# Patient Record
Sex: Female | Born: 1940 | Race: White | Hispanic: No | Smoking: Never smoker
Health system: Southern US, Community
[De-identification: ages and names within clinical notes are randomized; demographics above are authoritative.]

## PROBLEM LIST (undated history)

## (undated) DIAGNOSIS — M1990 Unspecified osteoarthritis, unspecified site: Secondary | ICD-10-CM

## (undated) DIAGNOSIS — C801 Malignant (primary) neoplasm, unspecified: Secondary | ICD-10-CM

## (undated) DIAGNOSIS — J45909 Unspecified asthma, uncomplicated: Secondary | ICD-10-CM

## (undated) DIAGNOSIS — I509 Heart failure, unspecified: Secondary | ICD-10-CM

## (undated) DIAGNOSIS — M170 Bilateral primary osteoarthritis of knee: Secondary | ICD-10-CM

## (undated) DIAGNOSIS — K5792 Diverticulitis of intestine, part unspecified, without perforation or abscess without bleeding: Secondary | ICD-10-CM

## (undated) DIAGNOSIS — J302 Other seasonal allergic rhinitis: Secondary | ICD-10-CM

## (undated) DIAGNOSIS — K219 Gastro-esophageal reflux disease without esophagitis: Secondary | ICD-10-CM

## (undated) DIAGNOSIS — C4490 Unspecified malignant neoplasm of skin, unspecified: Secondary | ICD-10-CM

## (undated) DIAGNOSIS — I499 Cardiac arrhythmia, unspecified: Secondary | ICD-10-CM

## (undated) DIAGNOSIS — G4730 Sleep apnea, unspecified: Secondary | ICD-10-CM

## (undated) DIAGNOSIS — I10 Essential (primary) hypertension: Secondary | ICD-10-CM

## (undated) DIAGNOSIS — T7840XA Allergy, unspecified, initial encounter: Secondary | ICD-10-CM

## (undated) DIAGNOSIS — E785 Hyperlipidemia, unspecified: Secondary | ICD-10-CM

## (undated) DIAGNOSIS — J189 Pneumonia, unspecified organism: Secondary | ICD-10-CM

## (undated) DIAGNOSIS — G5600 Carpal tunnel syndrome, unspecified upper limb: Secondary | ICD-10-CM

## (undated) DIAGNOSIS — B019 Varicella without complication: Secondary | ICD-10-CM

## (undated) DIAGNOSIS — Z85828 Personal history of other malignant neoplasm of skin: Secondary | ICD-10-CM

## (undated) DIAGNOSIS — Z9289 Personal history of other medical treatment: Secondary | ICD-10-CM

## (undated) DIAGNOSIS — K635 Polyp of colon: Secondary | ICD-10-CM

## (undated) DIAGNOSIS — K429 Umbilical hernia without obstruction or gangrene: Secondary | ICD-10-CM

## (undated) DIAGNOSIS — N83209 Unspecified ovarian cyst, unspecified side: Secondary | ICD-10-CM

## (undated) DIAGNOSIS — R0602 Shortness of breath: Secondary | ICD-10-CM

## (undated) DIAGNOSIS — R200 Anesthesia of skin: Secondary | ICD-10-CM

## (undated) DIAGNOSIS — H25019 Cortical age-related cataract, unspecified eye: Secondary | ICD-10-CM

## (undated) DIAGNOSIS — R202 Paresthesia of skin: Secondary | ICD-10-CM

## (undated) DIAGNOSIS — M810 Age-related osteoporosis without current pathological fracture: Secondary | ICD-10-CM

## (undated) DIAGNOSIS — I4891 Unspecified atrial fibrillation: Secondary | ICD-10-CM

## (undated) DIAGNOSIS — K5790 Diverticulosis of intestine, part unspecified, without perforation or abscess without bleeding: Secondary | ICD-10-CM

## (undated) HISTORY — PX: HERNIA REPAIR: SHX51

## (undated) HISTORY — DX: Unspecified atrial fibrillation: I48.91

## (undated) HISTORY — PX: UMBILICAL HERNIA REPAIR: SHX196

## (undated) HISTORY — PX: WISDOM TOOTH EXTRACTION: SHX21

## (undated) HISTORY — PX: JOINT REPLACEMENT: SHX530

## (undated) HISTORY — PX: OTHER SURGICAL HISTORY: SHX169

## (undated) HISTORY — PX: MASTECTOMY PARTIAL / LUMPECTOMY: SUR851

## (undated) HISTORY — DX: Heart failure, unspecified: I50.9

## (undated) HISTORY — DX: Allergy, unspecified, initial encounter: T78.40XA

## (undated) HISTORY — DX: Unspecified osteoarthritis, unspecified site: M19.90

## (undated) HISTORY — DX: Cortical age-related cataract, unspecified eye: H25.019

## (undated) HISTORY — DX: Unspecified ovarian cyst, unspecified side: N83.209

## (undated) HISTORY — DX: Personal history of other malignant neoplasm of skin: Z85.828

## (undated) HISTORY — DX: Unspecified asthma, uncomplicated: J45.909

## (undated) HISTORY — PX: BREAST SURGERY: SHX581

## (undated) HISTORY — PX: TRACHEOSTOMY: SUR1362

## (undated) HISTORY — DX: Personal history of other medical treatment: Z92.89

## (undated) HISTORY — DX: Unspecified malignant neoplasm of skin, unspecified: C44.90

## (undated) HISTORY — DX: Polyp of colon: K63.5

## (undated) HISTORY — PX: VARICOSE VEIN SURGERY: SHX832

## (undated) HISTORY — PX: ROTATOR CUFF REPAIR: SHX139

## (undated) HISTORY — DX: Other seasonal allergic rhinitis: J30.2

---
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
## 1961-06-22 HISTORY — PX: WISDOM TOOTH EXTRACTION: SHX21

## 1982-06-22 HISTORY — PX: BREAST BIOPSY: SHX20

## 1985-06-22 HISTORY — PX: BREAST BIOPSY: SHX20

## 1996-06-22 HISTORY — PX: WRIST SURGERY: SHX841

## 1999-06-23 DIAGNOSIS — K219 Gastro-esophageal reflux disease without esophagitis: Secondary | ICD-10-CM

## 1999-06-23 HISTORY — DX: Gastro-esophageal reflux disease without esophagitis: K21.9

## 2004-04-22 ENCOUNTER — Ambulatory Visit: Payer: Self-pay | Admitting: Internal Medicine

## 2004-08-07 ENCOUNTER — Emergency Department: Payer: Self-pay | Admitting: Emergency Medicine

## 2004-08-07 ENCOUNTER — Other Ambulatory Visit: Payer: Self-pay

## 2004-12-10 ENCOUNTER — Ambulatory Visit: Payer: Self-pay | Admitting: Internal Medicine

## 2005-01-12 ENCOUNTER — Ambulatory Visit: Payer: Self-pay | Admitting: Internal Medicine

## 2005-07-20 ENCOUNTER — Ambulatory Visit: Payer: Self-pay | Admitting: Unknown Physician Specialty

## 2005-07-20 HISTORY — PX: COLONOSCOPY: SHX174

## 2005-07-20 HISTORY — PX: ESOPHAGOGASTRODUODENOSCOPY: SHX1529

## 2006-01-21 ENCOUNTER — Ambulatory Visit: Payer: Self-pay | Admitting: Internal Medicine

## 2006-08-10 ENCOUNTER — Emergency Department: Payer: Self-pay | Admitting: Emergency Medicine

## 2007-01-26 ENCOUNTER — Ambulatory Visit: Payer: Self-pay | Admitting: Internal Medicine

## 2007-02-20 ENCOUNTER — Emergency Department: Payer: Self-pay | Admitting: Emergency Medicine

## 2007-05-03 ENCOUNTER — Ambulatory Visit: Payer: Self-pay

## 2007-06-23 HISTORY — PX: SKIN CANCER EXCISION: SHX779

## 2007-06-27 ENCOUNTER — Other Ambulatory Visit: Payer: Self-pay

## 2007-06-27 ENCOUNTER — Ambulatory Visit: Payer: Self-pay | Admitting: Unknown Physician Specialty

## 2007-06-29 ENCOUNTER — Ambulatory Visit: Payer: Self-pay | Admitting: Unknown Physician Specialty

## 2007-06-29 ENCOUNTER — Other Ambulatory Visit: Payer: Self-pay

## 2007-07-01 ENCOUNTER — Other Ambulatory Visit: Payer: Self-pay

## 2007-07-01 ENCOUNTER — Inpatient Hospital Stay: Payer: Self-pay | Admitting: Internal Medicine

## 2007-08-25 ENCOUNTER — Other Ambulatory Visit: Payer: Self-pay

## 2007-08-25 ENCOUNTER — Inpatient Hospital Stay: Payer: Self-pay | Admitting: Internal Medicine

## 2007-10-13 ENCOUNTER — Emergency Department: Payer: Self-pay | Admitting: Emergency Medicine

## 2007-10-13 ENCOUNTER — Other Ambulatory Visit: Payer: Self-pay

## 2007-10-20 ENCOUNTER — Ambulatory Visit: Payer: Self-pay | Admitting: Internal Medicine

## 2007-10-21 ENCOUNTER — Ambulatory Visit: Payer: Self-pay | Admitting: Internal Medicine

## 2007-11-28 ENCOUNTER — Ambulatory Visit: Payer: Self-pay | Admitting: Internal Medicine

## 2007-12-19 ENCOUNTER — Observation Stay: Payer: Self-pay | Admitting: Internal Medicine

## 2007-12-19 ENCOUNTER — Other Ambulatory Visit: Payer: Self-pay

## 2007-12-21 ENCOUNTER — Ambulatory Visit: Payer: Self-pay | Admitting: Internal Medicine

## 2008-01-30 ENCOUNTER — Ambulatory Visit: Payer: Self-pay | Admitting: Internal Medicine

## 2008-05-16 ENCOUNTER — Emergency Department: Payer: Self-pay | Admitting: Emergency Medicine

## 2008-06-22 HISTORY — PX: LIPOMA EXCISION: SHX5283

## 2009-01-31 ENCOUNTER — Ambulatory Visit: Payer: Self-pay | Admitting: Internal Medicine

## 2009-02-23 ENCOUNTER — Observation Stay: Payer: Self-pay | Admitting: Rheumatology

## 2009-06-22 DIAGNOSIS — G4730 Sleep apnea, unspecified: Secondary | ICD-10-CM

## 2009-06-22 HISTORY — DX: Sleep apnea, unspecified: G47.30

## 2009-07-09 ENCOUNTER — Emergency Department: Payer: Self-pay | Admitting: Emergency Medicine

## 2009-08-03 ENCOUNTER — Ambulatory Visit: Payer: Self-pay

## 2009-09-23 ENCOUNTER — Ambulatory Visit: Payer: Self-pay | Admitting: Unknown Physician Specialty

## 2009-09-27 ENCOUNTER — Ambulatory Visit: Payer: Self-pay | Admitting: Unknown Physician Specialty

## 2009-09-30 ENCOUNTER — Observation Stay: Payer: Self-pay | Admitting: Internal Medicine

## 2010-02-04 ENCOUNTER — Ambulatory Visit: Payer: Self-pay | Admitting: Internal Medicine

## 2010-03-14 ENCOUNTER — Emergency Department: Payer: Self-pay | Admitting: Emergency Medicine

## 2010-04-23 ENCOUNTER — Ambulatory Visit: Payer: Self-pay | Admitting: Cardiology

## 2010-05-01 ENCOUNTER — Ambulatory Visit: Payer: Self-pay | Admitting: Cardiology

## 2010-07-07 ENCOUNTER — Ambulatory Visit: Payer: Self-pay | Admitting: Unknown Physician Specialty

## 2010-07-07 HISTORY — PX: COLONOSCOPY: SHX174

## 2010-07-15 ENCOUNTER — Ambulatory Visit: Payer: Self-pay | Admitting: Surgery

## 2010-09-06 ENCOUNTER — Observation Stay: Payer: Self-pay | Admitting: Internal Medicine

## 2011-02-11 ENCOUNTER — Ambulatory Visit: Payer: Self-pay | Admitting: Internal Medicine

## 2011-02-25 ENCOUNTER — Ambulatory Visit: Payer: Self-pay | Admitting: Internal Medicine

## 2011-12-17 ENCOUNTER — Ambulatory Visit: Payer: Self-pay | Admitting: Unknown Physician Specialty

## 2011-12-17 LAB — CREATININE, SERUM
Creatinine: 0.92 mg/dL (ref 0.60–1.30)
EGFR (African American): >60

## 2012-02-01 ENCOUNTER — Observation Stay: Payer: Self-pay | Admitting: Internal Medicine

## 2012-02-01 LAB — COMPREHENSIVE METABOLIC PANEL
Alkaline Phosphatase: 110 U/L (ref 50–136)
Calcium, Total: 10.3 mg/dL — ABNORMAL HIGH (ref 8.5–10.1)
Chloride: 104 mmol/L (ref 98–107)
Co2: 29 mmol/L (ref 21–32)
EGFR (African American): >60
EGFR (Non-African Amer.): >60
Osmolality: 279 (ref 275–301)
SGPT (ALT): 32 U/L (ref 12–78)
Sodium: 140 mmol/L (ref 136–145)

## 2012-02-01 LAB — TROPONIN I: Troponin-I: <0.02 ng/mL

## 2012-02-01 LAB — CK TOTAL AND CKMB (NOT AT ARMC): CK-MB: 0.7 ng/mL (ref 0.5–3.6)

## 2012-02-01 LAB — URINALYSIS, COMPLETE
Bacteria: NONE SEEN
Bilirubin,UR: NEGATIVE
Blood: NEGATIVE
Glucose,UR: NEGATIVE mg/dL (ref 0–75)
Ketone: NEGATIVE
Ph: 7 (ref 4.5–8.0)
RBC,UR: 5 /HPF (ref 0–5)
Squamous Epithelial: <1

## 2012-02-01 LAB — PROTIME-INR: Prothrombin Time: 25.1 secs — ABNORMAL HIGH (ref 11.5–14.7)

## 2012-02-01 LAB — LIPASE, BLOOD: Lipase: 200 U/L (ref 73–393)

## 2012-02-01 LAB — CBC
HGB: 15.8 g/dL (ref 12.0–16.0)
MCH: 31.8 pg (ref 26.0–34.0)
MCHC: 33.5 g/dL (ref 32.0–36.0)
Platelet: 220 10*3/uL (ref 150–440)

## 2012-02-02 LAB — BASIC METABOLIC PANEL
BUN: 13 mg/dL (ref 7–18)
Chloride: 105 mmol/L (ref 98–107)
Creatinine: 0.9 mg/dL (ref 0.60–1.30)
EGFR (Non-African Amer.): >60
Glucose: 94 mg/dL (ref 65–99)
Potassium: 3.3 mmol/L — ABNORMAL LOW (ref 3.5–5.1)

## 2012-02-02 LAB — CBC WITH DIFFERENTIAL/PLATELET
Basophil %: 1 %
Eosinophil #: 0.1 10*3/uL (ref 0.0–0.7)
HCT: 43.1 % (ref 35.0–47.0)
HGB: 14.7 g/dL (ref 12.0–16.0)
Lymphocyte #: 1.8 10*3/uL (ref 1.0–3.6)
MCH: 32.2 pg (ref 26.0–34.0)
MCHC: 34 g/dL (ref 32.0–36.0)
MCV: 95 fL (ref 80–100)
Monocyte #: 0.9 x10 3/mm (ref 0.2–0.9)
Neutrophil #: 3.7 10*3/uL (ref 1.4–6.5)

## 2012-02-02 LAB — PROTIME-INR
INR: 2.1
Prothrombin Time: 24 secs — ABNORMAL HIGH (ref 11.5–14.7)

## 2012-02-02 LAB — TROPONIN I
Troponin-I: <0.02 ng/mL
Troponin-I: <0.02 ng/mL

## 2012-02-12 ENCOUNTER — Ambulatory Visit: Payer: Self-pay | Admitting: Internal Medicine

## 2012-02-27 ENCOUNTER — Ambulatory Visit: Payer: Self-pay | Admitting: Unknown Physician Specialty

## 2012-04-27 ENCOUNTER — Emergency Department: Payer: Self-pay | Admitting: Emergency Medicine

## 2012-04-27 LAB — BASIC METABOLIC PANEL
Anion Gap: 11 (ref 7–16)
BUN: 18 mg/dL (ref 7–18)
Chloride: 104 mmol/L (ref 98–107)
Creatinine: 0.93 mg/dL (ref 0.60–1.30)
EGFR (Non-African Amer.): >60
Osmolality: 285 (ref 275–301)

## 2012-04-27 LAB — CBC
HGB: 16.3 g/dL — ABNORMAL HIGH (ref 12.0–16.0)
MCH: 32 pg (ref 26.0–34.0)
Platelet: 209 10*3/uL (ref 150–440)
RBC: 5.09 10*6/uL (ref 3.80–5.20)
WBC: 7.5 10*3/uL (ref 3.6–11.0)

## 2012-04-27 LAB — CK TOTAL AND CKMB (NOT AT ARMC)
CK, Total: 121 U/L (ref 21–215)
CK-MB: 1 ng/mL (ref 0.5–3.6)

## 2012-04-27 LAB — TROPONIN I: Troponin-I: <0.02 ng/mL

## 2012-06-27 ENCOUNTER — Ambulatory Visit: Payer: Self-pay | Admitting: Internal Medicine

## 2012-07-28 ENCOUNTER — Encounter (HOSPITAL_COMMUNITY): Payer: Self-pay | Admitting: Pharmacist

## 2012-07-28 ENCOUNTER — Other Ambulatory Visit: Payer: Self-pay | Admitting: Neurosurgery

## 2012-08-01 NOTE — Pre-Procedure Instructions (Signed)
Laura Gonzalez  08/01/2012   Your procedure is scheduled on:  Thursday, February 13th.  Report to [facility] at 9:30 AM.  Call this number if you have problems the morning of surgery: [phone]   Remember:   Do not eat food or drink liquids after midnight.    Take these medicines the morning of surgery with A SIP OF WATER: Diltiazem (Cardizem), Lorataine  (Clartin), Theophylline (Theodur).  May use inhalers and nasal spray. Bring Albuterol inhaler to the hospital with you.  Stop taking Aspirin, Coumadin, Plavix, Effient and Herbal medications.  Don not take any NSAIDs ie: Ibuprofen,  Advil,Naproxen or any medication containing Aspirin.   Do not wear jewelry, make-up or nail polish.  Do not wear lotions, powders, or perfumes. You may wear deodorant.  Do not shave 48 hours prior to surgery.   Do not bring valuables to the hospital.  Contacts, dentures or bridgework may not be worn into surgery.  Leave suitcase in the car. After surgery it may be brought to your room.  For patients admitted to the hospital, checkout time is 11:00 AM the day of  discharge.   Patients discharged the day of surgery will not be allowed to drive  home.  Name and phone number of your driver:-   Special Instructions: Shower using CHG 2 nights before surgery and the night before surgery.  If you shower the day of surgery use CHG.  Use special wash - you have one bottle of CHG for all showers.  You should use approximately 1/3 of the bottle for each shower.   Please read over the following fact sheets that you were given: Pain Booklet, Coughing and Deep Breathing and Surgical Site Infection Prevention

## 2012-08-02 ENCOUNTER — Encounter (HOSPITAL_COMMUNITY): Payer: Self-pay

## 2012-08-02 ENCOUNTER — Encounter (HOSPITAL_COMMUNITY)
Admission: RE | Admit: 2012-08-02 | Discharge: 2012-08-02 | Disposition: A | Discharge status: Home / Self Care | Payer: Medicare Other | Source: Ambulatory Visit | Attending: Neurosurgery | Admitting: Neurosurgery

## 2012-08-02 HISTORY — DX: Sleep apnea, unspecified: G47.30

## 2012-08-02 HISTORY — DX: Essential (primary) hypertension: I10

## 2012-08-02 HISTORY — DX: Unspecified asthma, uncomplicated: J45.909

## 2012-08-02 HISTORY — DX: Anesthesia of skin: R20.2

## 2012-08-02 HISTORY — DX: Shortness of breath: R06.02

## 2012-08-02 HISTORY — DX: Umbilical hernia without obstruction or gangrene: K42.9

## 2012-08-02 HISTORY — DX: Pneumonia, unspecified organism: J18.9

## 2012-08-02 HISTORY — DX: Age-related osteoporosis without current pathological fracture: M81.0

## 2012-08-02 HISTORY — DX: Anesthesia of skin: R20.0

## 2012-08-02 HISTORY — DX: Cardiac arrhythmia, unspecified: I49.9

## 2012-08-02 HISTORY — DX: Gastro-esophageal reflux disease without esophagitis: K21.9

## 2012-08-02 HISTORY — DX: Unspecified osteoarthritis, unspecified site: M19.90

## 2012-08-02 HISTORY — DX: Malignant (primary) neoplasm, unspecified: C80.1

## 2012-08-02 LAB — URINALYSIS, ROUTINE W REFLEX MICROSCOPIC
Bilirubin Urine: NEGATIVE
Hgb urine dipstick: NEGATIVE
Specific Gravity, Urine: 1.019 (ref 1.005–1.030)
Urobilinogen, UA: 0.2 mg/dL (ref 0.0–1.0)
pH: 7 (ref 5.0–8.0)

## 2012-08-02 LAB — SURGICAL PCR SCREEN: MRSA, PCR: NEGATIVE

## 2012-08-02 LAB — CBC WITH DIFFERENTIAL/PLATELET
Basophils Relative: 0 % (ref 0–1)
HCT: 46.9 % — ABNORMAL HIGH (ref 36.0–46.0)
Hemoglobin: 15.8 g/dL — ABNORMAL HIGH (ref 12.0–15.0)
Lymphocytes Relative: 20 % (ref 12–46)
Lymphs Abs: 1.6 10*3/uL (ref 0.7–4.0)
MCHC: 33.7 g/dL (ref 30.0–36.0)
Monocytes Absolute: 0.9 10*3/uL (ref 0.1–1.0)
Monocytes Relative: 12 % (ref 3–12)
Neutro Abs: 5.3 10*3/uL (ref 1.7–7.7)
Neutrophils Relative %: 68 % (ref 43–77)
RBC: 5.03 MIL/uL (ref 3.87–5.11)
WBC: 7.8 10*3/uL (ref 4.0–10.5)

## 2012-08-02 LAB — BASIC METABOLIC PANEL
BUN: 22 mg/dL (ref 6–23)
Chloride: 100 mEq/L (ref 96–112)
GFR calc Af Amer: >90 mL/min (ref >90)
GFR calc non Af Amer: 82 mL/min — ABNORMAL LOW (ref >90)
Potassium: 3.6 mEq/L (ref 3.5–5.1)
Sodium: 141 mEq/L (ref 135–145)

## 2012-08-02 LAB — PROTIME-INR
INR: 1.13 (ref 0.00–1.49)
Prothrombin Time: 14.3 seconds (ref 11.6–15.2)

## 2012-08-02 LAB — URINE MICROSCOPIC-ADD ON

## 2012-08-02 LAB — APTT: aPTT: 38 seconds — ABNORMAL HIGH (ref 24–37)

## 2012-08-02 NOTE — Progress Notes (Signed)
Ms Fukushima said that she had a chest  X-ray within the year at [facility].  [facility] did not have a chest x-ray.  Will do Chest x-ray day os suregery.

## 2012-08-03 LAB — URINE CULTURE: Colony Count: 6000

## 2012-08-03 NOTE — Consult Note (Signed)
Anesthesia Chart Review:  Patient is a 72 year old female scheduled for C3-4, C4-5 ACDF on 08/09/12 by Dr. Phoebe Perch (rescheduled from 08/04/12 due to inclement weather).  History noted and includes afib.  Patient was evaluated by Cardiologist Dr. Lady Gary and cleared for this procedure.  Records from [facility] records showed: EKG on 07/09/11 showed SR, septal infarct (age undetermined), cannot rule out inferior infarct (age undetermined). Event monitor on 06/24/12 showed SR/SB, PACs.  Exercise treadmill test on 09/12/09 showed fair exercise tolerance with no arrhythmias or ischemia, normal LV function, EF 71%, no evidence of infarction or ischemia.  Preoperative labs noted.  She is for a CXR on the day of surgery.  Shonna Chock, PA-C 08/03/12 1640

## 2012-08-08 MED ORDER — CEFAZOLIN SODIUM-DEXTROSE 2-3 GM-% IV SOLR
2.0000 g | INTRAVENOUS | Status: DC
  Filled 2012-08-08 (×2): qty 50

## 2012-08-09 ENCOUNTER — Encounter (HOSPITAL_COMMUNITY)
Admission: RE | Disposition: A | Discharge status: Home / Self Care | Payer: Self-pay | Source: Ambulatory Visit | Attending: Neurosurgery

## 2012-08-09 ENCOUNTER — Inpatient Hospital Stay (HOSPITAL_COMMUNITY): Payer: Medicare Other | Admitting: Anesthesiology

## 2012-08-09 ENCOUNTER — Inpatient Hospital Stay (HOSPITAL_COMMUNITY)
Admission: RE | Admit: 2012-08-09 | Discharge: 2012-08-11 | DRG: 472 | Disposition: A | Discharge status: Home / Self Care | Payer: Medicare Other | Source: Ambulatory Visit | Attending: Neurosurgery | Admitting: Neurosurgery

## 2012-08-09 ENCOUNTER — Encounter (HOSPITAL_COMMUNITY): Payer: Self-pay | Admitting: Vascular Surgery

## 2012-08-09 ENCOUNTER — Inpatient Hospital Stay (HOSPITAL_COMMUNITY): Payer: Medicare Other

## 2012-08-09 DIAGNOSIS — M5000 Cervical disc disorder with myelopathy, unspecified cervical region: Principal | ICD-10-CM | POA: Diagnosis present

## 2012-08-09 DIAGNOSIS — Z01812 Encounter for preprocedural laboratory examination: Secondary | ICD-10-CM

## 2012-08-09 DIAGNOSIS — Z7901 Long term (current) use of anticoagulants: Secondary | ICD-10-CM

## 2012-08-09 DIAGNOSIS — Z79899 Other long term (current) drug therapy: Secondary | ICD-10-CM

## 2012-08-09 DIAGNOSIS — M4712 Other spondylosis with myelopathy, cervical region: Secondary | ICD-10-CM | POA: Diagnosis present

## 2012-08-09 HISTORY — PX: ANTERIOR CERVICAL DECOMP/DISCECTOMY FUSION: SHX1161

## 2012-08-09 SURGERY — ANTERIOR CERVICAL DECOMPRESSION/DISCECTOMY FUSION 2 LEVELS
Anesthesia: General | Site: Neck | Wound class: Clean

## 2012-08-09 MED ORDER — EZETIMIBE 10 MG PO TABS
10.0000 mg | ORAL_TABLET | Freq: Every day | ORAL | Status: DC
  Administered 2012-08-10: 10 mg via ORAL
  Filled 2012-08-09 (×4): qty 1

## 2012-08-09 MED ORDER — MOMETASONE FURO-FORMOTEROL FUM 100-5 MCG/ACT IN AERO
2.0000 | INHALATION_SPRAY | Freq: Two times a day (BID) | RESPIRATORY_TRACT | Status: DC
  Administered 2012-08-10 – 2012-08-11 (×3): 2 via RESPIRATORY_TRACT
  Filled 2012-08-09: qty 8.8

## 2012-08-09 MED ORDER — OXYCODONE HCL 5 MG PO TABS: 5.0000 mg | ORAL_TABLET | Freq: Once | ORAL | Status: DC | PRN

## 2012-08-09 MED ORDER — PROMETHAZINE HCL 25 MG PO TABS: 12.5000 mg | ORAL_TABLET | ORAL | Status: DC | PRN

## 2012-08-09 MED ORDER — VANCOMYCIN HCL IN DEXTROSE 1-5 GM/200ML-% IV SOLN
INTRAVENOUS | Status: AC
  Filled 2012-08-09: qty 200

## 2012-08-09 MED ORDER — HYDROMORPHONE HCL PF 1 MG/ML IJ SOLN
INTRAMUSCULAR | Status: AC
  Administered 2012-08-09: 0.5 mg
  Filled 2012-08-09: qty 1

## 2012-08-09 MED ORDER — DILTIAZEM HCL 60 MG PO TABS
120.0000 mg | ORAL_TABLET | Freq: Four times a day (QID) | ORAL | Status: DC | PRN
  Filled 2012-08-09: qty 2

## 2012-08-09 MED ORDER — LIDOCAINE-EPINEPHRINE 1 %-1:100000 IJ SOLN
INTRAMUSCULAR | Status: DC | PRN
  Administered 2012-08-09: 10 mL via INTRADERMAL

## 2012-08-09 MED ORDER — NEOSTIGMINE METHYLSULFATE 1 MG/ML IJ SOLN
INTRAMUSCULAR | Status: DC | PRN
  Administered 2012-08-09: 5 mg via INTRAVENOUS

## 2012-08-09 MED ORDER — ACETAMINOPHEN 650 MG RE SUPP: 650.0000 mg | RECTAL | Status: DC | PRN

## 2012-08-09 MED ORDER — LORATADINE 10 MG PO TABS
10.0000 mg | ORAL_TABLET | Freq: Every day | ORAL | Status: DC
  Administered 2012-08-10 – 2012-08-11 (×2): 10 mg via ORAL
  Filled 2012-08-09 (×2): qty 1

## 2012-08-09 MED ORDER — SODIUM CHLORIDE 0.9 % IR SOLN
Status: DC | PRN
  Administered 2012-08-09: 11:00:00

## 2012-08-09 MED ORDER — LIDOCAINE HCL 4 % MT SOLN
OROMUCOSAL | Status: DC | PRN
  Administered 2012-08-09: 4 mL via TOPICAL

## 2012-08-09 MED ORDER — LACTATED RINGERS IV SOLN
INTRAVENOUS | Status: DC | PRN
  Administered 2012-08-09 (×2): via INTRAVENOUS

## 2012-08-09 MED ORDER — ONDANSETRON HCL 4 MG/2ML IJ SOLN: 4.0000 mg | INTRAMUSCULAR | Status: DC | PRN

## 2012-08-09 MED ORDER — ONDANSETRON HCL 4 MG/2ML IJ SOLN
INTRAMUSCULAR | Status: DC | PRN
  Administered 2012-08-09: 4 mg via INTRAVENOUS

## 2012-08-09 MED ORDER — BISACODYL 10 MG RE SUPP: 10.0000 mg | Freq: Every day | RECTAL | Status: DC | PRN

## 2012-08-09 MED ORDER — ROCURONIUM BROMIDE 100 MG/10ML IV SOLN
INTRAVENOUS | Status: DC | PRN
  Administered 2012-08-09: 10 mg via INTRAVENOUS
  Administered 2012-08-09: 50 mg via INTRAVENOUS
  Administered 2012-08-09: 20 mg via INTRAVENOUS

## 2012-08-09 MED ORDER — 0.9 % SODIUM CHLORIDE (POUR BTL) OPTIME
TOPICAL | Status: DC | PRN
  Administered 2012-08-09: 1000 mL

## 2012-08-09 MED ORDER — PHENOL 1.4 % MT LIQD: 1.0000 | OROMUCOSAL | Status: DC | PRN

## 2012-08-09 MED ORDER — ALBUTEROL SULFATE HFA 108 (90 BASE) MCG/ACT IN AERS
2.0000 | INHALATION_SPRAY | Freq: Four times a day (QID) | RESPIRATORY_TRACT | Status: DC | PRN
  Filled 2012-08-09: qty 6.7

## 2012-08-09 MED ORDER — DILTIAZEM HCL ER COATED BEADS 300 MG PO CP24
300.0000 mg | ORAL_CAPSULE | Freq: Every day | ORAL | Status: DC
  Administered 2012-08-10 – 2012-08-11 (×2): 300 mg via ORAL
  Filled 2012-08-09 (×2): qty 1

## 2012-08-09 MED ORDER — MAGNESIUM HYDROXIDE 400 MG/5ML PO SUSP: 30.0000 mL | Freq: Every day | ORAL | Status: DC | PRN

## 2012-08-09 MED ORDER — METHOCARBAMOL 100 MG/ML IJ SOLN
500.0000 mg | Freq: Four times a day (QID) | INTRAVENOUS | Status: DC | PRN
  Filled 2012-08-09: qty 5

## 2012-08-09 MED ORDER — MEPERIDINE HCL 25 MG/ML IJ SOLN: 6.2500 mg | INTRAMUSCULAR | Status: DC | PRN

## 2012-08-09 MED ORDER — METHOCARBAMOL 500 MG PO TABS: 500.0000 mg | ORAL_TABLET | Freq: Four times a day (QID) | ORAL | Status: DC | PRN

## 2012-08-09 MED ORDER — CYCLOBENZAPRINE HCL 10 MG PO TABS
ORAL_TABLET | ORAL | Status: AC
  Filled 2012-08-09: qty 1

## 2012-08-09 MED ORDER — ACETAMINOPHEN 325 MG PO TABS: 650.0000 mg | ORAL_TABLET | ORAL | Status: DC | PRN

## 2012-08-09 MED ORDER — ZOLPIDEM TARTRATE 5 MG PO TABS: 5.0000 mg | ORAL_TABLET | Freq: Every evening | ORAL | Status: DC | PRN

## 2012-08-09 MED ORDER — THEOPHYLLINE ER 300 MG PO TB12
300.0000 mg | ORAL_TABLET | Freq: Two times a day (BID) | ORAL | Status: DC
  Administered 2012-08-10 – 2012-08-11 (×3): 300 mg via ORAL
  Filled 2012-08-09 (×5): qty 1

## 2012-08-09 MED ORDER — VITAMIN B-12 1000 MCG PO TABS
1000.0000 ug | ORAL_TABLET | Freq: Every day | ORAL | Status: DC
  Administered 2012-08-10 – 2012-08-11 (×2): 1000 ug via ORAL
  Filled 2012-08-09 (×3): qty 1

## 2012-08-09 MED ORDER — HYDROCODONE-ACETAMINOPHEN 5-325 MG PO TABS: 1.0000 | ORAL_TABLET | ORAL | Status: DC | PRN

## 2012-08-09 MED ORDER — MIDAZOLAM HCL 5 MG/5ML IJ SOLN
INTRAMUSCULAR | Status: DC | PRN
  Administered 2012-08-09: 2 mg via INTRAVENOUS

## 2012-08-09 MED ORDER — VANCOMYCIN HCL IN DEXTROSE 1-5 GM/200ML-% IV SOLN
1000.0000 mg | Freq: Once | INTRAVENOUS | Status: AC
  Administered 2012-08-09: 1000 mg via INTRAVENOUS
  Filled 2012-08-09: qty 200

## 2012-08-09 MED ORDER — BACITRACIN 50000 UNITS IM SOLR
INTRAMUSCULAR | Status: AC
  Filled 2012-08-09: qty 1

## 2012-08-09 MED ORDER — SODIUM CHLORIDE 0.9 % IV SOLN: 0.5000 mg/h | Freq: Once | INTRAVENOUS | Status: DC

## 2012-08-09 MED ORDER — HYDROMORPHONE HCL PF 1 MG/ML IJ SOLN
0.2500 mg | INTRAMUSCULAR | Status: DC | PRN
  Administered 2012-08-09 (×3): 0.5 mg via INTRAVENOUS

## 2012-08-09 MED ORDER — LACTATED RINGERS IV SOLN
INTRAVENOUS | Status: DC
  Administered 2012-08-09: 17:00:00 via INTRAVENOUS

## 2012-08-09 MED ORDER — PROMETHAZINE HCL 25 MG/ML IJ SOLN
12.5000 mg | INTRAMUSCULAR | Status: DC | PRN
  Administered 2012-08-09 (×2): 12.5 mg via INTRAVENOUS
  Filled 2012-08-09 (×2): qty 1

## 2012-08-09 MED ORDER — GLYCOPYRROLATE 0.2 MG/ML IJ SOLN
INTRAMUSCULAR | Status: DC | PRN
  Administered 2012-08-09: 0.6 mg via INTRAVENOUS

## 2012-08-09 MED ORDER — SODIUM CHLORIDE 0.9 % IJ SOLN: 3.0000 mL | INTRAMUSCULAR | Status: DC | PRN

## 2012-08-09 MED ORDER — ALBUTEROL SULFATE (5 MG/ML) 0.5% IN NEBU: 2.5000 mg | INHALATION_SOLUTION | Freq: Four times a day (QID) | RESPIRATORY_TRACT | Status: DC | PRN

## 2012-08-09 MED ORDER — SODIUM CHLORIDE 0.9 % IJ SOLN
3.0000 mL | Freq: Two times a day (BID) | INTRAMUSCULAR | Status: DC
  Administered 2012-08-09 – 2012-08-11 (×4): 3 mL via INTRAVENOUS

## 2012-08-09 MED ORDER — THROMBIN 5000 UNITS EX KIT
PACK | CUTANEOUS | Status: DC | PRN
  Administered 2012-08-09 (×2): 5000 [IU] via TOPICAL

## 2012-08-09 MED ORDER — ONDANSETRON HCL 4 MG/2ML IJ SOLN
INTRAMUSCULAR | Status: AC
  Filled 2012-08-09: qty 2

## 2012-08-09 MED ORDER — VANCOMYCIN HCL IN DEXTROSE 1-5 GM/200ML-% IV SOLN
1000.0000 mg | Freq: Once | INTRAVENOUS | Status: AC
  Administered 2012-08-09: 1000 mg via INTRAVENOUS

## 2012-08-09 MED ORDER — SUFENTANIL CITRATE 50 MCG/ML IV SOLN
INTRAVENOUS | Status: DC | PRN
  Administered 2012-08-09: 10 ug via INTRAVENOUS
  Administered 2012-08-09: 30 ug via INTRAVENOUS

## 2012-08-09 MED ORDER — DEXAMETHASONE SODIUM PHOSPHATE 4 MG/ML IJ SOLN
10.0000 mg | Freq: Once | INTRAMUSCULAR | Status: AC
  Administered 2012-08-09: 10 mg via INTRAVENOUS

## 2012-08-09 MED ORDER — MORPHINE SULFATE 2 MG/ML IJ SOLN
1.0000 mg | INTRAMUSCULAR | Status: DC | PRN
  Administered 2012-08-09: 2 mg via INTRAVENOUS
  Filled 2012-08-09: qty 1

## 2012-08-09 MED ORDER — FLUTICASONE PROPIONATE 50 MCG/ACT NA SUSP
1.0000 | Freq: Every day | NASAL | Status: DC
  Administered 2012-08-10 – 2012-08-11 (×2): 1 via NASAL
  Filled 2012-08-09: qty 16

## 2012-08-09 MED ORDER — ONDANSETRON HCL 4 MG/2ML IJ SOLN
4.0000 mg | Freq: Once | INTRAMUSCULAR | Status: AC | PRN
  Administered 2012-08-09: 4 mg via INTRAVENOUS

## 2012-08-09 MED ORDER — EPHEDRINE SULFATE 50 MG/ML IJ SOLN
INTRAMUSCULAR | Status: DC | PRN
  Administered 2012-08-09: 10 mg via INTRAVENOUS

## 2012-08-09 MED ORDER — PROPOFOL 10 MG/ML IV BOLUS
INTRAVENOUS | Status: DC | PRN
  Administered 2012-08-09: 100 mg via INTRAVENOUS
  Administered 2012-08-09: 30 mg via INTRAVENOUS
  Administered 2012-08-09: 20 mg via INTRAVENOUS

## 2012-08-09 MED ORDER — SODIUM CHLORIDE 0.9 % IV SOLN
INTRAVENOUS | Status: AC
  Filled 2012-08-09: qty 500

## 2012-08-09 MED ORDER — POTASSIUM CHLORIDE CRYS ER 20 MEQ PO TBCR
20.0000 meq | EXTENDED_RELEASE_TABLET | Freq: Three times a day (TID) | ORAL | Status: DC
  Administered 2012-08-10 – 2012-08-11 (×3): 20 meq via ORAL
  Filled 2012-08-09 (×8): qty 1

## 2012-08-09 MED ORDER — PHENYLEPHRINE HCL 10 MG/ML IJ SOLN
10.0000 mg | INTRAVENOUS | Status: DC | PRN
  Administered 2012-08-09: 60 ug/min via INTRAVENOUS

## 2012-08-09 MED ORDER — MONTELUKAST SODIUM 10 MG PO TABS
10.0000 mg | ORAL_TABLET | Freq: Every day | ORAL | Status: DC
  Administered 2012-08-10: 10 mg via ORAL
  Filled 2012-08-09 (×3): qty 1

## 2012-08-09 MED ORDER — CYCLOBENZAPRINE HCL 10 MG PO TABS
10.0000 mg | ORAL_TABLET | Freq: Three times a day (TID) | ORAL | Status: DC | PRN
  Administered 2012-08-09: 10 mg via ORAL

## 2012-08-09 MED ORDER — ADULT MULTIVITAMIN W/MINERALS CH
1.0000 | ORAL_TABLET | Freq: Every day | ORAL | Status: DC
  Administered 2012-08-10 – 2012-08-11 (×2): 1 via ORAL
  Filled 2012-08-09 (×3): qty 1

## 2012-08-09 MED ORDER — DOCUSATE SODIUM 100 MG PO CAPS
100.0000 mg | ORAL_CAPSULE | Freq: Two times a day (BID) | ORAL | Status: DC
  Administered 2012-08-11: 100 mg via ORAL
  Filled 2012-08-09 (×2): qty 1

## 2012-08-09 MED ORDER — MENTHOL 3 MG MT LOZG
1.0000 | LOZENGE | OROMUCOSAL | Status: DC | PRN
  Administered 2012-08-09: 3 mg via ORAL
  Filled 2012-08-09: qty 9

## 2012-08-09 MED ORDER — HYDROCHLOROTHIAZIDE 25 MG PO TABS
25.0000 mg | ORAL_TABLET | Freq: Every day | ORAL | Status: DC
  Administered 2012-08-10 – 2012-08-11 (×2): 25 mg via ORAL
  Filled 2012-08-09 (×3): qty 1

## 2012-08-09 MED ORDER — HEMOSTATIC AGENTS (NO CHARGE) OPTIME
TOPICAL | Status: DC | PRN
  Administered 2012-08-09: 1 via TOPICAL

## 2012-08-09 MED ORDER — HYDROMORPHONE HCL PF 1 MG/ML IJ SOLN
INTRAMUSCULAR | Status: AC
  Filled 2012-08-09: qty 1

## 2012-08-09 MED ORDER — OXYCODONE HCL 5 MG/5ML PO SOLN: 5.0000 mg | Freq: Once | ORAL | Status: DC | PRN

## 2012-08-09 SURGICAL SUPPLY — 50 items
BAG DECANTER FOR FLEXI CONT (MISCELLANEOUS) ×2 IMPLANT
BANDAGE GAUZE ELAST BULKY 4 IN (GAUZE/BANDAGES/DRESSINGS) ×4 IMPLANT
BENZOIN TINCTURE PRP APPL 2/3 (GAUZE/BANDAGES/DRESSINGS) ×2 IMPLANT
BIT DRILL 2.3 12 FIXED (INSTRUMENTS) ×1 IMPLANT
BONE CERV LORDOTIC 14.5X12X6 (Bone Implant) ×4 IMPLANT
BUR MATCHSTICK NEURO 3.0 LAGG (BURR) ×2 IMPLANT
CANISTER SUCTION 2500CC (MISCELLANEOUS) ×2 IMPLANT
CLOTH BEACON ORANGE TIMEOUT ST (SAFETY) ×2 IMPLANT
CONT SPEC 4OZ CLIKSEAL STRL BL (MISCELLANEOUS) ×2 IMPLANT
DRAPE LAPAROTOMY 100X72 PEDS (DRAPES) ×2 IMPLANT
DRAPE MICROSCOPE LEICA (MISCELLANEOUS) ×2 IMPLANT
DRAPE POUCH INSTRU U-SHP 10X18 (DRAPES) ×2 IMPLANT
DRILL 12MM (INSTRUMENTS) ×2
DURAPREP 6ML APPLICATOR 50/CS (WOUND CARE) ×2 IMPLANT
ELECT REM PT RETURN 9FT ADLT (ELECTROSURGICAL) ×2
ELECTRODE REM PT RTRN 9FT ADLT (ELECTROSURGICAL) ×1 IMPLANT
GAUZE SPONGE 4X4 16PLY XRAY LF (GAUZE/BANDAGES/DRESSINGS) IMPLANT
GLOVE BIOGEL M 8.0 STRL (GLOVE) ×2 IMPLANT
GLOVE ECLIPSE 7.5 STRL STRAW (GLOVE) ×2 IMPLANT
GLOVE EXAM NITRILE LRG STRL (GLOVE) IMPLANT
GLOVE EXAM NITRILE MD LF STRL (GLOVE) IMPLANT
GLOVE EXAM NITRILE XL STR (GLOVE) IMPLANT
GLOVE EXAM NITRILE XS STR PU (GLOVE) IMPLANT
GOWN BRE IMP SLV AUR LG STRL (GOWN DISPOSABLE) IMPLANT
GOWN BRE IMP SLV AUR XL STRL (GOWN DISPOSABLE) IMPLANT
GOWN STRL REIN 2XL LVL4 (GOWN DISPOSABLE) IMPLANT
HEAD HALTER (SOFTGOODS) ×2 IMPLANT
KIT BASIN OR (CUSTOM PROCEDURE TRAY) ×2 IMPLANT
KIT ROOM TURNOVER OR (KITS) ×2 IMPLANT
NEEDLE HYPO 22GX1.5 SAFETY (NEEDLE) IMPLANT
NEEDLE HYPO 25X1 1.5 SAFETY (NEEDLE) ×2 IMPLANT
NEEDLE SPNL 20GX3.5 QUINCKE YW (NEEDLE) ×2 IMPLANT
NS IRRIG 1000ML POUR BTL (IV SOLUTION) ×2 IMPLANT
PACK LAMINECTOMY NEURO (CUSTOM PROCEDURE TRAY) ×2 IMPLANT
PAD ARMBOARD 7.5X6 YLW CONV (MISCELLANEOUS) ×6 IMPLANT
PATTIES SURGICAL .75X.75 (GAUZE/BANDAGES/DRESSINGS) ×2 IMPLANT
PIN DISTRACTION 14MM (PIN) ×4 IMPLANT
PLATE 30MM (Plate) ×2 IMPLANT
RUBBERBAND STERILE (MISCELLANEOUS) ×4 IMPLANT
SCREW 12MM (Screw) ×12 IMPLANT
SPONGE GAUZE 4X4 12PLY (GAUZE/BANDAGES/DRESSINGS) ×2 IMPLANT
SPONGE INTESTINAL PEANUT (DISPOSABLE) ×2 IMPLANT
STRIP CLOSURE SKIN 1/2X4 (GAUZE/BANDAGES/DRESSINGS) ×2 IMPLANT
SUT VIC AB 3-0 SH 8-18 (SUTURE) ×2 IMPLANT
SYR 20ML ECCENTRIC (SYRINGE) ×2 IMPLANT
TAPE CLOTH SURG 4X10 WHT LF (GAUZE/BANDAGES/DRESSINGS) ×2 IMPLANT
TAPE STRIPS DRAPE STRL (GAUZE/BANDAGES/DRESSINGS) ×2 IMPLANT
TOWEL OR 17X24 6PK STRL BLUE (TOWEL DISPOSABLE) ×2 IMPLANT
TOWEL OR 17X26 10 PK STRL BLUE (TOWEL DISPOSABLE) ×2 IMPLANT
WATER STERILE IRR 1000ML POUR (IV SOLUTION) ×2 IMPLANT

## 2012-08-09 NOTE — Progress Notes (Signed)
Patient brought neck brace in with her and sent to OR with patient. Inhaler brought in with patient in chart ( Ventolin).

## 2012-08-09 NOTE — Op Note (Signed)
08/09/2012  12:29 PM  PATIENT:  Laura Gonzalez  72 y.o. female  PRE-OPERATIVE DIAGNOSIS:  Cervical degenerative disc disease,Cervicalgia, Cervical spondylosis, cervical stenosis with myelopathy  POST-OPERATIVE DIAGNOSIS:  Cervical degenerative disc disease,Cervicalgia, Cervical spondylosis,   cervical stenosis with myelopathy   PROCEDURE:  Procedure(s): ANTERIOR CERVICAL DECOMPRESSION/DISCECTOMY FUSION 2 LEVELS, C3-4, C4-5, with lifenet bone, trestle plate  SURGEON:  Surgeon(s): Clydene Fake, MD  ASSISTANTS:botero  ANESTHESIA:   general  EBL:  Total I/O In: 1000 [I.V.:1000] Out: 150 [Blood:150]  BLOOD ADMINISTERED:none  DRAINS: none   SPECIMEN:  No Specimen  DICTATION: Patient with neck pain headaches left arm pain total body aches pains chest pain clumsiness. She had multiple doctors at work at various issues finally an MRI C-spine was done showing cervical spinal change at multiple levels with stenosis and cord compression or so 3445 levels possible cord change at the 34 areas and patient with some mild myelopathy on exam after discussion was decided to proceed with 2 level ACF to decompress spinal cord.  Paper and operative general anesthesia induced patient placed in 10 pounds halter traction prepped draped sterile fashion 7 incision injected with 10 cc 1% lidocaine with epinephrine incision was then made from the midline to the border of the sternocleidomastoid muscle on the left side and neck. Hemostasis obtained with Bovie position a platysma was incised the Bovie and blunt dissection taken to the anterior cervical spine.   We exposed to disc spaces Needles were placed to an x-ray obtained showing the needles were in the 3-4 portal spaces. The spaces were incised with 15 blade as the knee was removed and partial discectomy done pituitary rongeurs. Contrast which removed with osteophyte tools lungs: Muscles reflected laterally and soaking retractors were placed. Distraction  pins were placed in C3 and C5 interspace distracted microscope was brought in for microdissection discectomy continued with curettes went to more Kerrison punches high-speed drill and starting 34 level and then at the fourth of level. We decompressed the central canal and spinal cord and bilateral foraminotomies were performed at both levels appeared were finished we did decompression measured to a height of the disc space at both L3-4 millimeters into formal LifeNet bone tapped in place. A trestle intracerebral plate was placed with interested in spine 2 screws placed in the region at C4-C5 these were tightened down lateral x-rays obtained showing good position plate-screw bone plugs at the 3-4 portal of level. Placed the plate we did remove the distraction pins removed weight from the traction it hemostasis with Gelfoam thrombin. We. About solution had very good hemostasis and the platysma closed through Vicryl interrupted sutures subcutaneous incision closed the same skin closed benzoin Steri-Strips dressing was placed patient saucer color woken (and transferred recovery.  PLAN OF CARE: Admit to inpatient   PATIENT DISPOSITION:  PACU - hemodynamically stable.

## 2012-08-09 NOTE — Progress Notes (Signed)
UR COMPLETED  

## 2012-08-09 NOTE — Plan of Care (Signed)
Problem: Consults Goal: Diagnosis - Spinal Surgery Outcome: Completed/Met Date Met:  08/09/12 Cervical Spine Fusion

## 2012-08-09 NOTE — Interval H&P Note (Signed)
History and Physical Interval Note:  08/09/2012 7:39 AM  Laura Gonzalez  has presented today for surgery, with the diagnosis of Cervical degenerative disc disease,Cervicalgia, Cervical spondylosis  The various methods of treatment have been discussed with the patient and family. After consideration of risks, benefits and other options for treatment, the patient has consented to  Procedure(s) with comments: ANTERIOR CERVICAL DECOMPRESSION/DISCECTOMY FUSION 2 LEVELS (N/A) - C3-4 C4-5 Anterior cervical decompression/diskectomy/fusion/LifeNet Bone/Trestle plate as a surgical intervention .  The patient's history has been reviewed, patient examined, no change in status, stable for surgery.  I have reviewed the patient's chart and labs.  Questions were answered to the patient's satisfaction.     Terresa Marlett R

## 2012-08-09 NOTE — Anesthesia Preprocedure Evaluation (Addendum)
Anesthesia Evaluation  Patient identified by MRN, date of birth, ID band Patient awake    Reviewed: Allergy & Precautions, H&P , NPO status   History of Anesthesia Complications Negative for: history of anesthetic complications  Airway Mallampati: II TM Distance: >3 FB Neck ROM: Full    Dental  (+) Teeth Intact and Dental Advisory Given   Pulmonary shortness of breath and with exertion, asthma , sleep apnea , pneumonia -, resolved,  breath sounds clear to auscultation        Cardiovascular hypertension, Pt. on medications + dysrhythmias Atrial Fibrillation Rhythm:Irregular Rate:Normal     Neuro/Psych negative neurological ROS     GI/Hepatic Neg liver ROS, GERD-  ,  Endo/Other  negative endocrine ROS  Renal/GU negative Renal ROS     Musculoskeletal   Abdominal   Peds  Hematology negative hematology ROS (+)   Anesthesia Other Findings   Reproductive/Obstetrics                          Anesthesia Physical Anesthesia Plan  ASA: III  Anesthesia Plan: General   Post-op Pain Management:    Induction: Intravenous  Airway Management Planned: Oral ETT  Additional Equipment:   Intra-op Plan:   Post-operative Plan: Extubation in OR  Informed Consent: I have reviewed the patients History and Physical, chart, labs and discussed the procedure including the risks, benefits and alternatives for the proposed anesthesia with the patient or authorized representative who has indicated his/her understanding and acceptance.     Plan Discussed with: CRNA and Surgeon  Anesthesia Plan Comments:        Anesthesia Quick Evaluation

## 2012-08-09 NOTE — Anesthesia Postprocedure Evaluation (Signed)
Anesthesia Post Note  Patient: Laura Gonzalez  Procedure(s) Performed: Procedure(s) (LRB): ANTERIOR CERVICAL DECOMPRESSION/DISCECTOMY FUSION 2 LEVELS (N/A)  Anesthesia type: general  Patient location: PACU  Post pain: Pain level controlled  Post assessment: Patient's Cardiovascular Status Stable  Last Vitals:  Filed Vitals:   08/09/12 1618  BP: 122/73  Pulse: 65  Temp: 36.7 C  Resp: 18    Post vital signs: Reviewed and stable  Level of consciousness: sedated  Complications: No apparent anesthesia complications

## 2012-08-09 NOTE — Progress Notes (Signed)
CXR and EKG in chart from 04/2012

## 2012-08-09 NOTE — H&P (Signed)
See H& P.

## 2012-08-09 NOTE — Progress Notes (Signed)
Pt. Snoring and sats dropping, has to be reminded to take deep breaths.

## 2012-08-09 NOTE — Transfer of Care (Signed)
Immediate Anesthesia Transfer of Care Note  Patient: Laura Gonzalez  Procedure(s) Performed: Procedure(s) with comments: ANTERIOR CERVICAL DECOMPRESSION/DISCECTOMY FUSION 2 LEVELS (N/A) - C3-4 C4-5 Anterior cervical decompression/diskectomy/fusion/LifeNet Bone/Trestle plate  Patient Location: PACU  Anesthesia Type:General  Level of Consciousness: awake, sedated and patient cooperative  Airway & Oxygen Therapy: Patient Spontanous Breathing and Patient connected to face mask oxygen  Post-op Assessment: Report given to PACU RN, Post -op Vital signs reviewed and stable, Patient moving all extremities and Patient moving all extremities X 4  Post vital signs: Reviewed and stable  Complications: No apparent anesthesia complications

## 2012-08-09 NOTE — Preoperative (Signed)
Beta Blockers   Reason not to administer Beta Blockers:Not Applicable 

## 2012-08-10 MED ORDER — DEXAMETHASONE SODIUM PHOSPHATE 4 MG/ML IJ SOLN
4.0000 mg | Freq: Four times a day (QID) | INTRAMUSCULAR | Status: DC
  Administered 2012-08-10 – 2012-08-11 (×5): 4 mg via INTRAVENOUS
  Filled 2012-08-10 (×8): qty 1

## 2012-08-10 MED ORDER — DEXAMETHASONE SODIUM PHOSPHATE 10 MG/ML IJ SOLN
INTRAMUSCULAR | Status: AC
  Administered 2012-08-10: 8 mg
  Filled 2012-08-10: qty 1

## 2012-08-10 MED ORDER — LACTATED RINGERS IV SOLN
INTRAVENOUS | Status: DC
  Administered 2012-08-10 – 2012-08-11 (×2): via INTRAVENOUS

## 2012-08-10 MED ORDER — DEXAMETHASONE SODIUM PHOSPHATE 4 MG/ML IJ SOLN: 8.0000 mg | Freq: Once | INTRAMUSCULAR | Status: AC

## 2012-08-10 NOTE — Evaluation (Signed)
Clinical/Bedside Swallow Evaluation Patient Details  Name: Laura Gonzalez MRN: [id number] Date of Birth: Sep 22, 1940  Today's Date: 08/10/2012 Time: 0945-1000 SLP Time Calculation (min): 15 min  Past Medical History:  Past Medical History  Diagnosis Date  . Dysrhythmia     afib  . Pneumonia   . Asthma   . Numbness and tingling     arm to leg  . Arthritis     wrist and knees  . Hernia, umbilical   . Shortness of breath     only with astma attacks  . Sleep apnea   . Cancer     skin cancer  . Osteoporosis   . Hypertension   . GERD (gastroesophageal reflux disease)    Past Surgical History:  Past Surgical History  Procedure Laterality Date  . Tonsillectomy  1960  . Umbilical hernia repair  [id number]  . Wisdom tooth extraction  1963  . Wrist surgery Right 1998    external fixator  . Colonscopy  2000,2007,2012  . Rotator cuff repair Bilateral right- 2009, left 2011  . Skin cancer excision  2009    back of neck and right cheek  . Lipoma excision Right 2010    back  . Varicose vein surgery Left 04/2009 rt [id number]   HPI:  72 year old female seem 2/18 for C3-4 C4-5 Anterior cervical decompression/diskectomy/fusion/LifeNet Bone/Trestle plate.C/o post op dysphagia.    Assessment / Plan / Recommendation Clinical Impression  Swallow evaluation complete at bedside. Patient presents with a suspected anatomical based dysphagia likely resulting from post op pharyngeal edema causing mild residuals and possible decreased airway protection during swallow. Patient however appears to be compensating with use of small bites/sips, multiple swallows, and occasional strong cough to decrease aspiration risk. Hopeful that with decreased swelling, oropharyngeal swallow will improve. Recommend a puree diet with thin liquids with strict aspiration precautions. Education complete with patient regarding rationale for and use of compensatory strategies and plan of care. SLP will f/u at bedside for  potential to advance with improved clinical function. If function worsens or does not improve in the next 24-48 hours, may consider objective evaluation of swallow.     Aspiration Risk  Moderate    Diet Recommendation Dysphagia 1 (Puree);Thin liquid   Liquid Administration via: Cup;Straw Medication Administration: Crushed with puree Supervision: Patient able to self feed;Intermittent supervision to cue for compensatory strategies Compensations: Slow rate;Small sips/bites;Multiple dry swallows after each bite/sip;Follow solids with liquid Postural Changes and/or Swallow Maneuvers: Seated upright 90 degrees;Upright 30-60 min after meal    Other  Recommendations Oral Care Recommendations: Oral care BID   Follow Up Recommendations  None    Frequency and Duration min 2x/week  2 weeks   Pertinent Vitals/Pain None reported other than odynophagia, RN aware.     SLP Swallow Goals Patient will utilize recommended strategies during swallow to increase swallowing safety with: Independent assistance Swallow Study Goal #2 - Progress: Not met   Swallow Study    General HPI: 72 year old female seem 2/18 for C3-4 C4-5 Anterior cervical decompression/diskectomy/fusion/LifeNet Bone/Trestle plate.C/o post op dysphagia.  Type of Study: Bedside swallow evaluation Previous Swallow Assessment: none Diet Prior to this Study: Regular;Thin liquids Temperature Spikes Noted: No Respiratory Status: Room air History of Recent Intubation: Yes Length of Intubations (days):  (for surgery only) Date extubated: 08/09/12 Behavior/Cognition: Alert;Cooperative;Pleasant mood Oral Cavity - Dentition: Adequate natural dentition Self-Feeding Abilities: Able to feed self Patient Positioning: Upright in bed Baseline Vocal Quality: Clear Volitional Cough: Strong (although  reports pain with swallow) Volitional Swallow: Able to elicit (with c/o pain)    Oral/Motor/Sensory Function Overall Oral Motor/Sensory Function:  Appears within functional limits for tasks assessed   Ice Chips Ice chips: Impaired Presentation: Spoon;Self Fed Pharyngeal Phase Impairments:  (multiple swallows, odynophagia)   Thin Liquid Thin Liquid: Impaired Presentation: Cup;Straw;Self Fed Pharyngeal  Phase Impairments: Multiple swallows (multiple swallows, odynophagia)    Nectar Thick Nectar Thick Liquid: Not tested   Honey Thick Honey Thick Liquid: Not tested   Puree Puree: Impaired Presentation: Spoon;Self Fed Pharyngeal Phase Impairments: Multiple swallows;Cough - Immediate (multiple swallows, odynophagia)   Solid   GO   Lourie Retz MA, CCC-SLP [phone]  Solid: Not tested       Caresse Sedivy Meryl 08/10/2012,10:22 AM

## 2012-08-11 ENCOUNTER — Encounter (HOSPITAL_COMMUNITY): Payer: Self-pay | Admitting: Neurosurgery

## 2012-08-11 MED ORDER — CYCLOBENZAPRINE HCL 10 MG PO TABS: 10.0000 mg | ORAL_TABLET | Freq: Three times a day (TID) | ORAL | Status: DC | PRN

## 2012-08-11 MED ORDER — TRAMADOL-ACETAMINOPHEN 37.5-325 MG PO TABS: 1.0000 | ORAL_TABLET | Freq: Four times a day (QID) | ORAL | Status: DC | PRN

## 2012-08-11 NOTE — Progress Notes (Signed)
Pt given D/C instructions with Rx's. Verbal understanding given by Pt. Pt D/C'd home with family via wheelchair @ 1545 per MD order. Rema Fendt, RN

## 2012-08-11 NOTE — Discharge Summary (Signed)
Physician Discharge Summary  Patient ID: Laura Gonzalez MRN: [id number] DOB/AGE: 08-05-1940 72 y.o.  Admit date: 08/09/2012 Discharge date: 08/11/2012  Admission Diagnoses:Cervical degenerative disc disease,Cervicalgia, Cervical spondylosis, cervical stenosis with myelopathy   Discharge Diagnoses: Cervical degenerative disc disease,Cervicalgia, Cervical spondylosis, cervical stenosis with myelopathy  Active Problems:   * No active hospital problems. *   Discharged Condition: fair  Hospital Course: pt admitted on day of surgery - underwent procedure below - pt had minimal pain, ambulated well - but had dysphagia -  Started on decadron -  And today much better - can swallow liquids and soft food - pt wishes to be d/c home  Consults: none  Significant Diagnostic Studies: none  Treatments: surgery: ANTERIOR CERVICAL DECOMPRESSION/DISCECTOMY FUSION 2 LEVELS, C3-4, C4-5, with lifenet bone, trestle plate   Discharge Exam: Blood pressure 117/79, pulse 87, temperature 98.8 F (37.1 C), temperature source Oral, resp. rate 18, height 5' 4.96" (1.65 m), weight 104.5 kg (230 lb 6.1 oz), SpO2 92.00%. Wound:c/d/i  Disposition: home     Medication List    TAKE these medications       albuterol 108 (90 BASE) MCG/ACT inhaler  Commonly known as:  PROVENTIL HFA;VENTOLIN HFA  Inhale 2 puffs into the lungs every 6 (six) hours as needed. For shortness of breath     albuterol (2.5 MG/3ML) 0.083% nebulizer solution  Commonly known as:  PROVENTIL  Take 2.5 mg by nebulization every 6 (six) hours as needed. For shortness of breath     CITRACAL PO  Take 1 tablet by mouth 2 (two) times daily.     cyclobenzaprine 10 MG tablet  Commonly known as:  FLEXERIL  Take 1 tablet (10 mg total) by mouth 3 (three) times daily as needed for muscle spasms.     diltiazem 300 MG 24 hr capsule  Commonly known as:  CARDIZEM CD  Take 300 mg by mouth daily.     diltiazem 60 MG tablet  Commonly known as:   CARDIZEM  Take 120 mg by mouth every 6 (six) hours as needed. Takes if has palpitation after taking diltiazem 300mg  dose     ezetimibe 10 MG tablet  Commonly known as:  ZETIA  Take 10 mg by mouth at bedtime.     Fish Oil 1200 MG Caps  Take 1,200 mg by mouth daily.     Fluticasone-Salmeterol 100-50 MCG/DOSE Aepb  Commonly known as:  ADVAIR  Inhale 1 puff into the lungs every 12 (twelve) hours.     hydrochlorothiazide 25 MG tablet  Commonly known as:  HYDRODIURIL  Take 25 mg by mouth daily.     loratadine 10 MG tablet  Commonly known as:  CLARITIN  Take 10 mg by mouth daily.     magnesium oxide 400 MG tablet  Commonly known as:  MAG-OX  Take 400 mg by mouth daily.     mometasone 50 MCG/ACT nasal spray  Commonly known as:  NASONEX  Place 1 spray into the nose daily.     montelukast 10 MG tablet  Commonly known as:  SINGULAIR  Take 10 mg by mouth at bedtime.     multivitamin with minerals Tabs  Take 1 tablet by mouth daily.     OSTEO BI-FLEX JOINT SHIELD PO  Take 1 tablet by mouth daily.     OVER THE COUNTER MEDICATION  Take 3,000 Units by mouth daily. Vitamin D3     potassium chloride SA 20 MEQ tablet  Commonly known as:  K-DUR,KLOR-CON  Take 20 mEq by mouth 3 (three) times daily.     PRESERVISION AREDS PO  Take 1 tablet by mouth daily.     theophylline 300 MG 12 hr tablet  Commonly known as:  THEODUR  Take 300 mg by mouth 2 (two) times daily.     traMADol-acetaminophen 37.5-325 MG per tablet  Commonly known as:  ULTRACET  Take 1-2 tablets by mouth every 6 (six) hours as needed for pain.     vitamin B-12 1000 MCG tablet  Commonly known as:  CYANOCOBALAMIN  Take 1,000 mcg by mouth daily.     warfarin 6 MG tablet  Commonly known as:  COUMADIN  Take 6 mg by mouth daily at 6 PM.         Signed: Clydene Fake, MD 08/11/2012, 11:50 AM

## 2012-08-11 NOTE — Progress Notes (Signed)
Speech Language Pathology Dysphagia Treatment Patient Details Name: Laura Gonzalez MRN: [id number] DOB: [birth year] Today's Date: 08/11/2012 Time: 1100-1115 SLP Time Calculation (min): 15 min  Assessment / Plan / Recommendation Clinical Impression  Treatment focused on diet tolerance and differential diagnosis of swallowing abilities for potential diet upgrade. Per patient report and skilled observation with clinician provided po trials, patient with decreased odynophagia today. Patient does continue to report intermittent sensation of pharyngeal residuals which are likely due to pharyngeal edema. Positive s/s of decreased airway protection noted with large, consecutive sips of thin liquids, decreased with minimal SLP verbal cueing to continue with small single sips. Overall, cough response strong however. Suspect full clearance of aspirates. Noted possible d/c today. Educated patient on importance of continued use of compensatory strategies and aspiration precautions. Instructed patient to gradually increase po solids textures (declined today), beginning with very soft, moist solids, and advance to regular as tolerated. If patient continues to experience difficulty swallowing over the next 1-2 weeks, would consider ordering an OP MBS to objectively evaluate function. SLP will f/u while inpatient.     Diet Recommendation  Continue with Current Diet: Dysphagia 1 (puree);Thin liquid    SLP Plan Continue with current plan of care   Pertinent Vitals/Pain None reported   Swallowing Goals  SLP Swallowing Goals Patient will utilize recommended strategies during swallow to increase swallowing safety with: Independent assistance Swallow Study Goal #2 - Progress: Progressing toward goal  General Temperature Spikes Noted: No Respiratory Status: Room air Behavior/Cognition: Alert;Cooperative;Pleasant mood Oral Cavity - Dentition: Adequate natural dentition Patient Positioning: Upright in chair      Dysphagia Treatment Treatment focused on: Skilled observation of diet tolerance;Patient/family/caregiver education;Utilization of compensatory strategies Treatment Methods/Modalities: Skilled observation;Differential diagnosis Patient observed directly with PO's: Yes Type of PO's observed: Dysphagia 1 (puree);Thin liquids Feeding: Able to feed self Liquids provided via: Cup;Straw Pharyngeal Phase Signs & Symptoms: Immediate cough;Immediate throat clear Type of cueing: Verbal Amount of cueing: Minimal   GO    Laura Lango MA, CCC-SLP [phone]  Laura Gonzalez Laura Gonzalez 08/11/2012, 3:15 PM

## 2012-11-29 ENCOUNTER — Ambulatory Visit: Payer: Self-pay | Admitting: Unknown Physician Specialty

## 2013-02-15 ENCOUNTER — Ambulatory Visit: Payer: Self-pay | Admitting: Internal Medicine

## 2013-12-15 DIAGNOSIS — G4730 Sleep apnea, unspecified: Secondary | ICD-10-CM | POA: Insufficient documentation

## 2014-01-29 ENCOUNTER — Ambulatory Visit: Payer: Self-pay | Admitting: Unknown Physician Specialty

## 2014-02-16 ENCOUNTER — Ambulatory Visit: Payer: Self-pay | Admitting: Internal Medicine

## 2014-03-20 LAB — TROPONIN I: Troponin-I: <0.02 ng/mL

## 2014-03-20 LAB — CBC WITH DIFFERENTIAL/PLATELET
Basophil #: 0.1 10*3/uL (ref 0.0–0.1)
Basophil %: 0.8 %
Eosinophil #: 0.1 10*3/uL (ref 0.0–0.7)
Eosinophil %: 0.8 %
HCT: 46.5 % (ref 35.0–47.0)
HGB: 14.9 g/dL (ref 12.0–16.0)
Lymphocyte #: 1.2 10*3/uL (ref 1.0–3.6)
Lymphocyte %: 12.2 %
MCH: 30.2 pg (ref 26.0–34.0)
MCHC: 32 g/dL (ref 32.0–36.0)
MCV: 94 fL (ref 80–100)
MONOS PCT: 10.2 %
Monocyte #: 1 x10 3/mm — ABNORMAL HIGH (ref 0.2–0.9)
Neutrophil #: 7.7 10*3/uL — ABNORMAL HIGH (ref 1.4–6.5)
Neutrophil %: 76 %
PLATELETS: 209 10*3/uL (ref 150–440)
RBC: 4.93 10*6/uL (ref 3.80–5.20)
RDW: 14.5 % (ref 11.5–14.5)
WBC: 10.2 10*3/uL (ref 3.6–11.0)

## 2014-03-20 LAB — COMPREHENSIVE METABOLIC PANEL
ALBUMIN: 3.6 g/dL (ref 3.4–5.0)
ALK PHOS: 96 U/L
ALT: 30 U/L
AST: 25 U/L (ref 15–37)
Anion Gap: 10 (ref 7–16)
BUN: 17 mg/dL (ref 7–18)
Bilirubin,Total: 0.4 mg/dL (ref 0.2–1.0)
CREATININE: 0.84 mg/dL (ref 0.60–1.30)
Calcium, Total: 9.8 mg/dL (ref 8.5–10.1)
Chloride: 101 mmol/L (ref 98–107)
Co2: 28 mmol/L (ref 21–32)
GLUCOSE: 101 mg/dL — AB (ref 65–99)
Osmolality: 279 (ref 275–301)
Potassium: 3.6 mmol/L (ref 3.5–5.1)
Sodium: 139 mmol/L (ref 136–145)
Total Protein: 7.5 g/dL (ref 6.4–8.2)

## 2014-03-20 LAB — PROTIME-INR
INR: 1.8
Prothrombin Time: 20.8 secs — ABNORMAL HIGH (ref 11.5–14.7)

## 2014-03-20 LAB — APTT: ACTIVATED PTT: 41.7 s — AB (ref 23.6–35.9)

## 2014-03-20 LAB — CK TOTAL AND CKMB (NOT AT ARMC)
CK, TOTAL: 94 U/L
CK-MB: 0.7 ng/mL (ref 0.5–3.6)

## 2014-03-20 LAB — CK-MB
CK-MB: 0.5 ng/mL (ref 0.5–3.6)
CK-MB: 0.7 ng/mL (ref 0.5–3.6)

## 2014-03-21 ENCOUNTER — Inpatient Hospital Stay: Payer: Self-pay | Admitting: Internal Medicine

## 2014-03-21 LAB — TSH: Thyroid Stimulating Horm: 0.486 u[IU]/mL

## 2014-03-22 LAB — CBC WITH DIFFERENTIAL/PLATELET
Basophil #: 0 10*3/uL (ref 0.0–0.1)
Basophil %: 0.3 %
EOS ABS: 0 10*3/uL (ref 0.0–0.7)
EOS PCT: 0.2 %
HCT: 44.1 % (ref 35.0–47.0)
HGB: 14 g/dL (ref 12.0–16.0)
LYMPHS PCT: 9.4 %
Lymphocyte #: 1.3 10*3/uL (ref 1.0–3.6)
MCH: 30.4 pg (ref 26.0–34.0)
MCHC: 31.8 g/dL — ABNORMAL LOW (ref 32.0–36.0)
MCV: 96 fL (ref 80–100)
MONOS PCT: 9.9 %
Monocyte #: 1.4 x10 3/mm — ABNORMAL HIGH (ref 0.2–0.9)
NEUTROS ABS: 11 10*3/uL — AB (ref 1.4–6.5)
Neutrophil %: 80.2 %
Platelet: 210 10*3/uL (ref 150–440)
RBC: 4.61 10*6/uL (ref 3.80–5.20)
RDW: 14.5 % (ref 11.5–14.5)
WBC: 13.8 10*3/uL — AB (ref 3.6–11.0)

## 2014-03-22 LAB — COMPREHENSIVE METABOLIC PANEL
ALK PHOS: 79 U/L
AST: 13 U/L — AB (ref 15–37)
Albumin: 3 g/dL — ABNORMAL LOW (ref 3.4–5.0)
Anion Gap: 4 — ABNORMAL LOW (ref 7–16)
BUN: 16 mg/dL (ref 7–18)
Bilirubin,Total: 0.4 mg/dL (ref 0.2–1.0)
Calcium, Total: 9 mg/dL (ref 8.5–10.1)
Chloride: 107 mmol/L (ref 98–107)
Co2: 31 mmol/L (ref 21–32)
Creatinine: 0.8 mg/dL (ref 0.60–1.30)
EGFR (African American): >60
GLUCOSE: 91 mg/dL (ref 65–99)
Osmolality: 284 (ref 275–301)
Potassium: 3.8 mmol/L (ref 3.5–5.1)
SGPT (ALT): 20 U/L
SODIUM: 142 mmol/L (ref 136–145)
Total Protein: 6.8 g/dL (ref 6.4–8.2)

## 2014-03-22 LAB — PROTIME-INR
INR: 2.2
PROTHROMBIN TIME: 23.7 s — AB (ref 11.5–14.7)

## 2014-03-23 LAB — CBC WITH DIFFERENTIAL/PLATELET
BASOS ABS: 0 10*3/uL (ref 0.0–0.1)
Basophil %: 0.1 %
EOS PCT: 0 %
Eosinophil #: 0 10*3/uL (ref 0.0–0.7)
HCT: 42.9 % (ref 35.0–47.0)
HGB: 13.6 g/dL (ref 12.0–16.0)
Lymphocyte #: 0.6 10*3/uL — ABNORMAL LOW (ref 1.0–3.6)
Lymphocyte %: 4.5 %
MCH: 29.9 pg (ref 26.0–34.0)
MCHC: 31.6 g/dL — ABNORMAL LOW (ref 32.0–36.0)
MCV: 94 fL (ref 80–100)
MONOS PCT: 4 %
Monocyte #: 0.5 x10 3/mm (ref 0.2–0.9)
NEUTROS ABS: 11.4 10*3/uL — AB (ref 1.4–6.5)
Neutrophil %: 91.4 %
Platelet: 197 10*3/uL (ref 150–440)
RBC: 4.55 10*6/uL (ref 3.80–5.20)
RDW: 14.2 % (ref 11.5–14.5)
WBC: 12.4 10*3/uL — ABNORMAL HIGH (ref 3.6–11.0)

## 2014-03-23 LAB — BASIC METABOLIC PANEL
Anion Gap: 7 (ref 7–16)
BUN: 12 mg/dL (ref 7–18)
Calcium, Total: 9.2 mg/dL (ref 8.5–10.1)
Chloride: 105 mmol/L (ref 98–107)
Co2: 28 mmol/L (ref 21–32)
Creatinine: 0.76 mg/dL (ref 0.60–1.30)
EGFR (African American): >60
EGFR (Non-African Amer.): >60
GLUCOSE: 143 mg/dL — AB (ref 65–99)
Osmolality: 282 (ref 275–301)
Potassium: 3.7 mmol/L (ref 3.5–5.1)
Sodium: 140 mmol/L (ref 136–145)

## 2014-03-23 LAB — PROTIME-INR
INR: 2.4
Prothrombin Time: 25.8 secs — ABNORMAL HIGH (ref 11.5–14.7)

## 2014-03-24 LAB — CBC WITH DIFFERENTIAL/PLATELET
BASOS PCT: 0.2 %
Basophil #: 0 10*3/uL (ref 0.0–0.1)
EOS ABS: 0 10*3/uL (ref 0.0–0.7)
Eosinophil %: 0 %
HCT: 42.6 % (ref 35.0–47.0)
HGB: 13.6 g/dL (ref 12.0–16.0)
Lymphocyte #: 0.6 10*3/uL — ABNORMAL LOW (ref 1.0–3.6)
Lymphocyte %: 5.6 %
MCH: 30 pg (ref 26.0–34.0)
MCHC: 32 g/dL (ref 32.0–36.0)
MCV: 94 fL (ref 80–100)
Monocyte #: 0.7 x10 3/mm (ref 0.2–0.9)
Monocyte %: 6 %
NEUTROS ABS: 10.3 10*3/uL — AB (ref 1.4–6.5)
NEUTROS PCT: 88.2 %
PLATELETS: 222 10*3/uL (ref 150–440)
RBC: 4.53 10*6/uL (ref 3.80–5.20)
RDW: 14.2 % (ref 11.5–14.5)
WBC: 11.7 10*3/uL — ABNORMAL HIGH (ref 3.6–11.0)

## 2014-03-24 LAB — BASIC METABOLIC PANEL
ANION GAP: 7 (ref 7–16)
BUN: 18 mg/dL (ref 7–18)
CHLORIDE: 105 mmol/L (ref 98–107)
Calcium, Total: 9.3 mg/dL (ref 8.5–10.1)
Co2: 29 mmol/L (ref 21–32)
Creatinine: 0.76 mg/dL (ref 0.60–1.30)
EGFR (Non-African Amer.): >60
GLUCOSE: 142 mg/dL — AB (ref 65–99)
Osmolality: 286 (ref 275–301)
POTASSIUM: 3.7 mmol/L (ref 3.5–5.1)
Sodium: 141 mmol/L (ref 136–145)

## 2014-03-24 LAB — PROTIME-INR
INR: 2.9
PROTHROMBIN TIME: 29.6 s — AB (ref 11.5–14.7)

## 2014-03-25 LAB — CBC WITH DIFFERENTIAL/PLATELET
BASOS PCT: 0.1 %
Basophil #: 0 10*3/uL (ref 0.0–0.1)
Eosinophil #: 0 10*3/uL (ref 0.0–0.7)
Eosinophil %: 0 %
HCT: 43 % (ref 35.0–47.0)
HGB: 13.5 g/dL (ref 12.0–16.0)
LYMPHS PCT: 6.6 %
Lymphocyte #: 0.6 10*3/uL — ABNORMAL LOW (ref 1.0–3.6)
MCH: 29.9 pg (ref 26.0–34.0)
MCHC: 31.4 g/dL — ABNORMAL LOW (ref 32.0–36.0)
MCV: 95 fL (ref 80–100)
Monocyte #: 0.4 x10 3/mm (ref 0.2–0.9)
Monocyte %: 4.6 %
NEUTROS ABS: 8.2 10*3/uL — AB (ref 1.4–6.5)
NEUTROS PCT: 88.7 %
PLATELETS: 234 10*3/uL (ref 150–440)
RBC: 4.51 10*6/uL (ref 3.80–5.20)
RDW: 14.3 % (ref 11.5–14.5)
WBC: 9.3 10*3/uL (ref 3.6–11.0)

## 2014-03-25 LAB — BASIC METABOLIC PANEL
Anion Gap: 5 — ABNORMAL LOW (ref 7–16)
BUN: 20 mg/dL — AB (ref 7–18)
CHLORIDE: 103 mmol/L (ref 98–107)
CREATININE: 0.94 mg/dL (ref 0.60–1.30)
Calcium, Total: 8.6 mg/dL (ref 8.5–10.1)
Co2: 29 mmol/L (ref 21–32)
EGFR (African American): >60
Glucose: 136 mg/dL — ABNORMAL HIGH (ref 65–99)
Osmolality: 279 (ref 275–301)
POTASSIUM: 3.9 mmol/L (ref 3.5–5.1)
Sodium: 137 mmol/L (ref 136–145)

## 2014-03-25 LAB — PROTIME-INR
INR: 3.1
Prothrombin Time: 31.3 secs — ABNORMAL HIGH (ref 11.5–14.7)

## 2014-03-26 LAB — CBC WITH DIFFERENTIAL/PLATELET
Basophil #: 0 10*3/uL (ref 0.0–0.1)
Basophil %: 0.2 %
Eosinophil #: 0 10*3/uL (ref 0.0–0.7)
Eosinophil %: 0.1 %
HCT: 41 % (ref 35.0–47.0)
HGB: 13.4 g/dL (ref 12.0–16.0)
LYMPHS PCT: 11.5 %
Lymphocyte #: 1.1 10*3/uL (ref 1.0–3.6)
MCH: 30.8 pg (ref 26.0–34.0)
MCHC: 32.7 g/dL (ref 32.0–36.0)
MCV: 94 fL (ref 80–100)
Monocyte #: 1 x10 3/mm — ABNORMAL HIGH (ref 0.2–0.9)
Monocyte %: 10.3 %
NEUTROS ABS: 7.3 10*3/uL — AB (ref 1.4–6.5)
Neutrophil %: 77.9 %
PLATELETS: 237 10*3/uL (ref 150–440)
RBC: 4.35 10*6/uL (ref 3.80–5.20)
RDW: 14.1 % (ref 11.5–14.5)
WBC: 9.3 10*3/uL (ref 3.6–11.0)

## 2014-03-26 LAB — COMPREHENSIVE METABOLIC PANEL
ALK PHOS: 63 U/L
AST: 19 U/L (ref 15–37)
Albumin: 2.5 g/dL — ABNORMAL LOW (ref 3.4–5.0)
Anion Gap: 6 — ABNORMAL LOW (ref 7–16)
BILIRUBIN TOTAL: 0.3 mg/dL (ref 0.2–1.0)
BUN: 22 mg/dL — ABNORMAL HIGH (ref 7–18)
CALCIUM: 8.8 mg/dL (ref 8.5–10.1)
CHLORIDE: 105 mmol/L (ref 98–107)
CREATININE: 0.84 mg/dL (ref 0.60–1.30)
Co2: 29 mmol/L (ref 21–32)
EGFR (Non-African Amer.): >60
GLUCOSE: 109 mg/dL — AB (ref 65–99)
OSMOLALITY: 283 (ref 275–301)
Potassium: 3.9 mmol/L (ref 3.5–5.1)
SGPT (ALT): 37 U/L
Sodium: 140 mmol/L (ref 136–145)
Total Protein: 5.9 g/dL — ABNORMAL LOW (ref 6.4–8.2)

## 2014-03-26 LAB — PROTIME-INR
INR: 3.2
PROTHROMBIN TIME: 32 s — AB (ref 11.5–14.7)

## 2014-05-30 ENCOUNTER — Ambulatory Visit: Payer: Self-pay | Admitting: Unknown Physician Specialty

## 2014-05-30 LAB — BASIC METABOLIC PANEL
Anion Gap: 5 — ABNORMAL LOW (ref 7–16)
BUN: 15 mg/dL (ref 7–18)
Calcium, Total: 9.7 mg/dL (ref 8.5–10.1)
Chloride: 103 mmol/L (ref 98–107)
Co2: 32 mmol/L (ref 21–32)
Creatinine: 0.81 mg/dL (ref 0.60–1.30)
GLUCOSE: 101 mg/dL — AB (ref 65–99)
Osmolality: 280 (ref 275–301)
POTASSIUM: 3.7 mmol/L (ref 3.5–5.1)
Sodium: 140 mmol/L (ref 136–145)

## 2014-05-30 LAB — URINALYSIS, COMPLETE
BACTERIA: NONE SEEN
BLOOD: NEGATIVE
Bilirubin,UR: NEGATIVE
Glucose,UR: NEGATIVE mg/dL (ref 0–75)
Ketone: NEGATIVE
Nitrite: NEGATIVE
Ph: 6 (ref 4.5–8.0)
Protein: NEGATIVE
RBC,UR: 1 /HPF (ref 0–5)
Specific Gravity: 1.015 (ref 1.003–1.030)
Squamous Epithelial: 1
WBC UR: 4 /HPF (ref 0–5)

## 2014-05-30 LAB — CBC
HCT: 45.7 % (ref 35.0–47.0)
HGB: 15.1 g/dL (ref 12.0–16.0)
MCH: 31.6 pg (ref 26.0–34.0)
MCHC: 33 g/dL (ref 32.0–36.0)
MCV: 96 fL (ref 80–100)
PLATELETS: 213 10*3/uL (ref 150–440)
RBC: 4.78 10*6/uL (ref 3.80–5.20)
RDW: 14.7 % — ABNORMAL HIGH (ref 11.5–14.5)
WBC: 6.9 10*3/uL (ref 3.6–11.0)

## 2014-05-30 LAB — PROTIME-INR
INR: 2.4
Prothrombin Time: 25.3 secs — ABNORMAL HIGH (ref 11.5–14.7)

## 2014-05-30 LAB — MRSA PCR SCREENING

## 2014-05-30 LAB — APTT: Activated PTT: 43 secs — ABNORMAL HIGH (ref 23.6–35.9)

## 2014-06-13 ENCOUNTER — Inpatient Hospital Stay: Payer: Self-pay | Admitting: Unknown Physician Specialty

## 2014-06-13 HISTORY — PX: REPLACEMENT TOTAL KNEE: SUR1224

## 2014-06-13 LAB — PROTIME-INR
INR: 1
PROTHROMBIN TIME: 13.2 s (ref 11.5–14.7)

## 2014-06-14 LAB — BASIC METABOLIC PANEL
Anion Gap: 9 (ref 7–16)
BUN: 6 mg/dL — AB (ref 7–18)
Calcium, Total: 8.5 mg/dL (ref 8.5–10.1)
Chloride: 103 mmol/L (ref 98–107)
Co2: 28 mmol/L (ref 21–32)
Creatinine: 0.64 mg/dL (ref 0.60–1.30)
EGFR (Non-African Amer.): >60
GLUCOSE: 129 mg/dL — AB (ref 65–99)
Osmolality: 279 (ref 275–301)
POTASSIUM: 3.1 mmol/L — AB (ref 3.5–5.1)
Sodium: 140 mmol/L (ref 136–145)

## 2014-06-14 LAB — PROTIME-INR
INR: 1.1
Prothrombin Time: 14.2 secs (ref 11.5–14.7)

## 2014-06-14 LAB — HEMOGLOBIN: HGB: 13.8 g/dL (ref 12.0–16.0)

## 2014-06-15 DIAGNOSIS — I4949 Other premature depolarization: Secondary | ICD-10-CM

## 2014-06-15 LAB — PROTIME-INR
INR: 1.4
PROTHROMBIN TIME: 17 s — AB (ref 11.5–14.7)

## 2014-06-15 LAB — HEMOGLOBIN: HGB: 13.5 g/dL (ref 12.0–16.0)

## 2014-06-16 DIAGNOSIS — I4949 Other premature depolarization: Secondary | ICD-10-CM

## 2014-06-16 LAB — CK TOTAL AND CKMB (NOT AT ARMC)
CK, TOTAL: 108 U/L (ref 26–192)
CK, TOTAL: 86 U/L (ref 26–192)
CK, Total: 141 U/L (ref 26–192)
CK-MB: 2.8 ng/mL (ref 0.5–3.6)
CK-MB: 1.9 ng/mL (ref 0.5–3.6)
CK-MB: 1.5 ng/mL (ref 0.5–3.6)

## 2014-06-16 LAB — BASIC METABOLIC PANEL
Anion Gap: 8 (ref 7–16)
BUN: 12 mg/dL (ref 7–18)
CREATININE: 1 mg/dL (ref 0.60–1.30)
Calcium, Total: 8.8 mg/dL (ref 8.5–10.1)
Chloride: 97 mmol/L — ABNORMAL LOW (ref 98–107)
Co2: 29 mmol/L (ref 21–32)
EGFR (African American): >60
EGFR (Non-African Amer.): 58 — ABNORMAL LOW
Glucose: 156 mg/dL — ABNORMAL HIGH (ref 65–99)
OSMOLALITY: 271 (ref 275–301)
Potassium: 3.6 mmol/L (ref 3.5–5.1)
Sodium: 134 mmol/L — ABNORMAL LOW (ref 136–145)

## 2014-06-16 LAB — PROTIME-INR
INR: 1.7
Prothrombin Time: 19.4 secs — ABNORMAL HIGH (ref 11.5–14.7)

## 2014-06-16 LAB — TROPONIN I
TROPONIN-I: 0.44 ng/mL — AB
Troponin-I: 0.51 ng/mL — ABNORMAL HIGH
Troponin-I: 0.33 ng/mL — ABNORMAL HIGH

## 2014-06-17 LAB — BASIC METABOLIC PANEL
ANION GAP: 7 (ref 7–16)
BUN: 15 mg/dL (ref 7–18)
Calcium, Total: 9.1 mg/dL (ref 8.5–10.1)
Chloride: 102 mmol/L (ref 98–107)
Co2: 27 mmol/L (ref 21–32)
Creatinine: 0.73 mg/dL (ref 0.60–1.30)
EGFR (Non-African Amer.): >60
Glucose: 118 mg/dL — ABNORMAL HIGH (ref 65–99)
Osmolality: 274 (ref 275–301)
Potassium: 4.2 mmol/L (ref 3.5–5.1)
SODIUM: 136 mmol/L (ref 136–145)

## 2014-06-17 LAB — HEMOGLOBIN: HGB: 12.6 g/dL (ref 12.0–16.0)

## 2014-06-17 LAB — MAGNESIUM: Magnesium: 2 mg/dL

## 2014-06-17 LAB — PROTIME-INR
INR: 2.3
PROTHROMBIN TIME: 24.8 s — AB (ref 11.5–14.7)

## 2014-06-18 LAB — CBC WITH DIFFERENTIAL/PLATELET
BASOS PCT: 0.2 %
Basophil #: 0 10*3/uL (ref 0.0–0.1)
EOS PCT: 0.3 %
Eosinophil #: 0 10*3/uL (ref 0.0–0.7)
HCT: 39.3 % (ref 35.0–47.0)
HGB: 13 g/dL (ref 12.0–16.0)
Lymphocyte #: 0.8 10*3/uL — ABNORMAL LOW (ref 1.0–3.6)
Lymphocyte %: 8.4 %
MCH: 31.3 pg (ref 26.0–34.0)
MCHC: 33.1 g/dL (ref 32.0–36.0)
MCV: 95 fL (ref 80–100)
Monocyte #: 1.3 x10 3/mm — ABNORMAL HIGH (ref 0.2–0.9)
Monocyte %: 13 %
NEUTROS ABS: 7.8 10*3/uL — AB (ref 1.4–6.5)
Neutrophil %: 78.1 %
Platelet: 239 10*3/uL (ref 150–440)
RBC: 4.16 10*6/uL (ref 3.80–5.20)
RDW: 14.6 % — ABNORMAL HIGH (ref 11.5–14.5)
WBC: 10 10*3/uL (ref 3.6–11.0)

## 2014-06-18 LAB — BASIC METABOLIC PANEL
ANION GAP: 9 (ref 7–16)
BUN: 22 mg/dL — AB (ref 7–18)
CALCIUM: 9.4 mg/dL (ref 8.5–10.1)
CHLORIDE: 99 mmol/L (ref 98–107)
CREATININE: 0.83 mg/dL (ref 0.60–1.30)
Co2: 29 mmol/L (ref 21–32)
EGFR (African American): >60
EGFR (Non-African Amer.): >60
Glucose: 123 mg/dL — ABNORMAL HIGH (ref 65–99)
OSMOLALITY: 279 (ref 275–301)
Potassium: 3.7 mmol/L (ref 3.5–5.1)
SODIUM: 137 mmol/L (ref 136–145)

## 2014-06-18 LAB — PROTIME-INR
INR: 2.9
Prothrombin Time: 29.2 secs — ABNORMAL HIGH (ref 11.5–14.7)

## 2014-06-19 LAB — BASIC METABOLIC PANEL
ANION GAP: 8 (ref 7–16)
BUN: 21 mg/dL — ABNORMAL HIGH (ref 7–18)
Calcium, Total: 9.6 mg/dL (ref 8.5–10.1)
Chloride: 101 mmol/L (ref 98–107)
Co2: 29 mmol/L (ref 21–32)
Creatinine: 0.71 mg/dL (ref 0.60–1.30)
EGFR (African American): >60
GLUCOSE: 105 mg/dL — AB (ref 65–99)
OSMOLALITY: 279 (ref 275–301)
Potassium: 3.6 mmol/L (ref 3.5–5.1)
Sodium: 138 mmol/L (ref 136–145)

## 2014-06-19 LAB — CBC WITH DIFFERENTIAL/PLATELET
Basophil #: 0 10*3/uL (ref 0.0–0.1)
Basophil %: 0.3 %
EOS PCT: 0.6 %
Eosinophil #: 0.1 10*3/uL (ref 0.0–0.7)
HCT: 37.8 % (ref 35.0–47.0)
HGB: 12.3 g/dL (ref 12.0–16.0)
LYMPHS ABS: 1.1 10*3/uL (ref 1.0–3.6)
Lymphocyte %: 11.4 %
MCH: 31.2 pg (ref 26.0–34.0)
MCHC: 32.5 g/dL (ref 32.0–36.0)
MCV: 96 fL (ref 80–100)
MONOS PCT: 13.2 %
Monocyte #: 1.3 x10 3/mm — ABNORMAL HIGH (ref 0.2–0.9)
NEUTROS ABS: 7.2 10*3/uL — AB (ref 1.4–6.5)
Neutrophil %: 74.5 %
Platelet: 264 10*3/uL (ref 150–440)
RBC: 3.94 10*6/uL (ref 3.80–5.20)
RDW: 14.6 % — AB (ref 11.5–14.5)
WBC: 9.6 10*3/uL (ref 3.6–11.0)

## 2014-06-19 LAB — PROTIME-INR
INR: 2.9
Prothrombin Time: 29.7 secs — ABNORMAL HIGH (ref 11.5–14.7)

## 2014-06-20 ENCOUNTER — Encounter: Payer: Self-pay | Admitting: Internal Medicine

## 2014-06-20 LAB — PROTIME-INR
INR: 2.3
Prothrombin Time: 25.1 secs — ABNORMAL HIGH (ref 11.5–14.7)

## 2014-06-22 ENCOUNTER — Encounter: Payer: Self-pay | Admitting: Internal Medicine

## 2014-06-23 LAB — PROTIME-INR
INR: 1.6
PROTHROMBIN TIME: 18.8 s — AB (ref 11.5–14.7)

## 2014-06-26 LAB — PROTIME-INR
INR: 1.9
PROTHROMBIN TIME: 21 s — AB (ref 11.5–14.7)

## 2014-06-28 LAB — PROTIME-INR
INR: 1.9
Prothrombin Time: 21.2 secs — ABNORMAL HIGH (ref 11.5–14.7)

## 2014-07-03 LAB — PROTIME-INR
INR: 2.2
Prothrombin Time: 24 secs — ABNORMAL HIGH (ref 11.5–14.7)

## 2014-07-05 LAB — PROTIME-INR
INR: 1.7
Prothrombin Time: 19.9 secs — ABNORMAL HIGH (ref 11.5–14.7)

## 2014-07-23 ENCOUNTER — Encounter: Payer: Self-pay | Admitting: Internal Medicine

## 2014-08-21 ENCOUNTER — Encounter
Admit: 2014-08-21 | Disposition: A | Discharge status: Home / Self Care | Payer: Self-pay | Attending: Internal Medicine | Admitting: Internal Medicine

## 2014-09-21 ENCOUNTER — Encounter
Admit: 2014-09-21 | Disposition: A | Discharge status: Home / Self Care | Payer: Self-pay | Attending: Internal Medicine | Admitting: Internal Medicine

## 2014-10-09 ENCOUNTER — Emergency Department
Admit: 2014-10-09 | Disposition: A | Discharge status: Home / Self Care | Payer: Self-pay | Admitting: Emergency Medicine

## 2014-10-09 LAB — TROPONIN I: Troponin-I: <0.03 ng/mL

## 2014-10-09 LAB — BASIC METABOLIC PANEL
Anion Gap: 7 (ref 7–16)
BUN: 15 mg/dL
CHLORIDE: 105 mmol/L
CREATININE: 0.73 mg/dL
Calcium, Total: 9.2 mg/dL
Co2: 26 mmol/L
EGFR (African American): >60
EGFR (Non-African Amer.): >60
Glucose: 123 mg/dL — ABNORMAL HIGH
Potassium: 4 mmol/L
Sodium: 138 mmol/L

## 2014-10-09 LAB — CBC
HCT: 45.3 % (ref 35.0–47.0)
HGB: 15.1 g/dL (ref 12.0–16.0)
MCH: 30.4 pg (ref 26.0–34.0)
MCHC: 33.3 g/dL (ref 32.0–36.0)
MCV: 91 fL (ref 80–100)
PLATELETS: 219 10*3/uL (ref 150–440)
RBC: 4.97 10*6/uL (ref 3.80–5.20)
RDW: 14.2 % (ref 11.5–14.5)
WBC: 10.4 10*3/uL (ref 3.6–11.0)

## 2014-10-09 LAB — D-DIMER(ARMC): D-Dimer: 2215 ng/ml

## 2014-10-09 LAB — PROTIME-INR
INR: 1.9
Prothrombin Time: 21.5 secs — ABNORMAL HIGH

## 2014-10-09 NOTE — H&P (Signed)
PATIENT NAME:  Laura Gonzalez, Laura Gonzalez MR#:  [id number] DATE OF BIRTH:  12/12/1940  DATE OF ADMISSION:  02/01/2012  CHIEF COMPLAINT: Chest pain.   HISTORY OF PRESENT ILLNESS: 74 year old female with a history of hypertension, asthma, hyperlipidemia, dyspepsia, atrial fibrillation, who developed onset of chest discomfort earlier this morning. Reports that initially she had some left arm discomfort at about 7:30 in the morning, followed by chest pressure and discomfort about an hour later, and this progressed to move into her jaw, accompanied by headache and generalized malaise. No new shortness of breath, has some of this at baseline. No palpitations. On Coumadin chronically for atrial fibrillation, remained on this, without palpitations. Came to the Emergency Room for further evaluation, where initial cardiac enzymes are negative and EKG reveals sinus rhythm with PACs, PVCs but without acute ST or T wave changes. Discussed the case with cardiology who recommended that patient will be admitted for further evaluation and potential stress testing.   PAST MEDICAL HISTORY:  1. Hypertension.  2. Reactive airway disease.  3. Hyperlipidemia.  4. Gastroesophageal reflux disease.  5. Paroxysmal atrial fibrillation.  6. Obesity.  7. Osteoporosis.  8. Sleep apnea.  9. History of bilateral rotator cuff repairs.  10. History of umbilical hernia repair.  11. History of lumpectomies.   ALLERGIES: Alendronate, amoxicillin, Chloraseptic, Chlor-Trimeton, Neosporin, Percocet which caused lip tingling, shellfish, statins, tape, gauze.   MEDICATIONS:  1. Advair 100/50, 1 puff b.i.d.  2. Albuterol 2.5 mg in 3 mL, one inhalation q.6h. p.r.n. shortness of breath.  3. Cardizem CD 300 mg p.o. daily. 4. Multivitamin 1 p.o. daily.  5. Citracal plus D 1 p.o. b.i.d.  6. Diltiazem 60 mg daily p.r.n. atrial fibrillation.  7. Fish oil 1200 mg p.o. daily. 8. Fortical 1 spray daily, alternate nostrils.  9. HCTZ 25 mg p.o. daily.   10. Instaflex 1 p.o. b.i.d.  11. Loratadine 10 mg p.o. daily.  12. Magnesium oxide 400 mg p.o. daily. 13. Singulair 10 mg p.o. at bedtime.  14. Nasonex two sprays each nostril daily.  15. Potassium 20 mEq p.o. b.i.d.  16. PreserVision 1 p.o. daily.  17. Proventil MDI 1 puff 4 times a day as needed for wheezing.  18. Refresh Tears t.i.d.  19. Theodore 300 mg p.o. b.i.d.  20. Vitamin B12 1000 mcg p.o. daily.  21. Vitamin D3 2000 units p.o. daily.  22. Coumadin 5 mg p.o. at bedtime.  23. Zetia 10 mg p.o. daily.   SOCIAL HISTORY: No alcohol or tobacco.   FAMILY HISTORY: CLL and breast cancer.   REVIEW OF SYSTEMS: Please see history of present illness. Denies fevers, chills, cough. No neck stiffness. Headache better. No photophobia. Still with chest pain. No change in bowels, bladder. No edema. No rash. Remainder of complete review of systems is negative.   PHYSICAL EXAMINATION:  VITAL SIGNS: Temperature 98.3, pulse 88, blood pressure 129/80, respirations 18, saturation 96% on room air, weight 120 kg.   GENERAL: Well-developed, well-nourished female, no distress.   EYES: Pupils round and reactive to light. Lids and conjunctiva unremarkable.   ENT: External examination unremarkable. Oropharynx is moist without lesions.   NECK: Supple. Trachea midline. No thyromegaly.   CARDIOVASCULAR: Regular rate and rhythm without significant gallops, rubs. Carotid and radial pulses are 2+. Distal pulses 2+.   LUNGS: Clear bilaterally. No wheeze or retractions.   ABDOMEN: Soft, nontender, nondistended. Positive bowel sounds. No guard, rebound, mass.   SKIN: No significant rashes or nodules.   LYMPH NODES: No cervical  or supraclavicular nodes.   MUSCULOSKELETAL: No clubbing or cyanosis. Trace ankle edema.   NEUROLOGIC: Cranial nerves intact and moves all extremities.   LABORATORY, DIAGNOSTIC, AND RADIOLOGICAL DATA: EKG reveals normal sinus rhythm with PACs and PVCs. Somewhat slow R wave  progression is noted, with left axis deviation. Chest x-ray reveals no acute cardiopulmonary disease other than what may be some discoid atelectasis in the lingula. Enzymes negative. Lipase normal. Calcium minimally elevated at 10.3 with remainder of electrolytes normal. Creatinine 0.92 with glucose 89, white count 6.7 with hematocrit 47, platelets 220. INR 2.3.   IMPRESSION AND PLAN:  1. Chest pain typical and atypical features, cardiac enzymes negative now in about seven hours, but patient with risk factors and will place her on telemetry overnight, with plan for a stress test in the morning if we get her pain free. Cardiology has been contacted as noted above. Discussed options for pain control. Review of chart shows that she took morphine in March of last year without issue. This was discussed with the patient and she affirms that she believes she can take morphine without problems. Review of the chart had question of anaphylaxis to Percocet, but on questioning the patient further, this was not anaphylaxis but a feeling of tingling around the lips, which she has gotten with several medications in the past and she is comfortable trying this medication. Add nitroglycerin, and will continue Cardizem, holding on beta blocker secondary to her asthma, unless more clearly evidence of actual cardiac source of the above.  2. Asthma versus chronic obstructive pulmonary disease. Continue inhaled medications.  3. Hypertension. Would hold hydrochlorothiazide for now and follow blood pressure, heart rate as noted above.  4. Hyperlipidemia. Hold Zetia for now.  5. Reflux disease. Placed on pantoprazole.  6. Atrial fibrillation. Patient currently in sinus rhythm. Continue Coumadin for now unless enzymes being to rise, in which case will hold this in anticipation of possible catheterization.   ____________________________ Adin Hector, MD bjk:cms D: 02/01/2012 18:47:03 ET T: 02/02/2012 07:37:45  ET JOB#: [id number]  cc: Tama High III, MD, <Dictator> Ramonita Lab MD ELECTRONICALLY SIGNED 02/04/2012 12:18

## 2014-10-10 ENCOUNTER — Emergency Department
Admit: 2014-10-10 | Disposition: A | Discharge status: Home / Self Care | Payer: Self-pay | Admitting: Emergency Medicine

## 2014-10-10 LAB — BASIC METABOLIC PANEL
ANION GAP: 8 (ref 7–16)
BUN: 12 mg/dL
CALCIUM: 9.2 mg/dL
CO2: 25 mmol/L
Chloride: 105 mmol/L
Creatinine: 0.69 mg/dL
EGFR (African American): >60
EGFR (Non-African Amer.): >60
Glucose: 136 mg/dL — ABNORMAL HIGH
POTASSIUM: 3.3 mmol/L — AB
SODIUM: 138 mmol/L

## 2014-10-10 LAB — CBC
HCT: 43.9 % (ref 35.0–47.0)
HGB: 14.4 g/dL (ref 12.0–16.0)
MCH: 30 pg (ref 26.0–34.0)
MCHC: 32.9 g/dL (ref 32.0–36.0)
MCV: 91 fL (ref 80–100)
Platelet: 203 10*3/uL (ref 150–440)
RBC: 4.82 10*6/uL (ref 3.80–5.20)
RDW: 14.1 % (ref 11.5–14.5)
WBC: 12.4 10*3/uL — ABNORMAL HIGH (ref 3.6–11.0)

## 2014-10-10 LAB — TROPONIN I

## 2014-10-10 LAB — PROTIME-INR
INR: 2.1
Prothrombin Time: 24.1 secs — ABNORMAL HIGH

## 2014-10-13 NOTE — H&P (Signed)
PATIENT NAME:  Laura Gonzalez, Laura Gonzalez MR#:  [id number] DATE OF BIRTH:  [birth year]  DATE OF ADMISSION:  03/20/2014  REFERRING PHYSICIAN:  Valli Glance. Owens Shark, MD  PRIMARY CARE PHYSICIAN: John B. Sarina Ser, MD - [place]: Chest pain.   HISTORY OF PRESENT ILLNESS: This 74 year old Caucasian female who presents to the Emergency Department complaining of chest pain. She states it has been there essentially all day and gradually worsened in severity. It began in the mid epigastric region and radiated to the right side of her chest. After some time it seemed to rise into pain in her neck. She denies feeling any nausea or vomiting at that time. She does admit to some shortness of breath, which has been present over a last few days, that she attributes to her asthma flaring due to the change in weather. She denies any diaphoresis or lightheadedness. The patient went about running errands and doing some light chores today before she felt as though she needed to lay down. She felt somewhat better at that time, but noticed that it was difficult to breathe by this evening, so she came to the Emergency Department. Due to the significant cardiovascular risk factors, the  Emergency Department called for admission.   REVIEW OF SYSTEMS:  CONSTITUTIONAL: The patient denies fever or weakness.  HEENT: Denies tinnitus or sore throat.  EYES: Denies diplopia or inflammation.  RESPIRATORY: The patient denies cough, but admits to some shortness of breath.  CARDIOVASCULAR: The patient admits to right-sided chest pain, but denies orthopnea, paroxysmal nocturnal dyspnea or dyspnea on exertion.  GASTROINTESTINAL: The patient currently admits to nausea after receiving some morphine, but denies vomiting or abdominal pain.  GENITOURINARY: Denies dysuria, increased frequency or hesitancy.  ENDOCRINE: Denies polyuria or nocturia.  HEMATOLOGIC AND LYMPHATIC: Denies easy bruising or bleeding.  INTEGUMENT: Denies  rashes or lesions.  MUSCULOSKELETAL: Denies myalgias, but admits to arthralgia, particularly of her right knee.  NEUROLOGIC: The patient denies numbness in her extremities or dysarthria.  PSYCHIATRIC: Denies depression or anxiety or suicidal ideation.   PAST MEDICAL HISTORY: Atrial fibrillation, hypertension, gastroesophageal reflux disease, asthma, and obstructive sleep apnea.   PAST SURGICAL HISTORY: Tracheotomy in 1944, tonsillectomy, dental surgeries, a lumpectomy in 1984 and 1987, external fixation of her right wrist, hernia repairs x2, bilateral rotator cuff surgery, excisional biopsies of skin cancers on her back, and lipoma removal.   SOCIAL HISTORY: The patient does not smoke, drink or do any illegal drugs. She lives alone, but is very active. She is a Psychologist, occupational here at the hospital.   FAMILY HISTORY: Significant for: Coronary artery disease, diabetes type 2. Her mother was a survivor of breast cancer, and her father had chronic lymphocytic leukemia.   MEDICATIONS:  1.  Advair Diskus 100 mcg/50 mg 1 puff twice a day.  2.  Potassium chloride 20 mEq 1 tablet p.o. t.i.d. 3.  Theophylline 300 mg 1 capsule p.o. b.i.d.  4.  ProAir 2 inhalations every 4 hours as needed for coughing, wheezing or shortness of breath.  5.  Zetia 10 mg 1 tablet p.o. daily.  6.  Montelukast 10 mg 1 tablet p.o. daily.  7.  Hydrochlorothiazide 25 mg 1 tablet p.o. daily.  8.  Warfarin 4 mg tablets, dose changes per instructions from Anticoagulation Clinic.  9.  Diltiazem 300 mg extended release 1 capsule p.o. daily. The patient also takes diltiazem 60 mg 1 tablet daily as needed for flare-ups of atrial fibrillation.   ALLERGIES: AMOXICILLIN, STATINS,  SHELLFISH, AS WELL AS OTHER FISH, CHLORASEPTIC, CHLOR-TRIMETON, NEOSPORIN, BIAFINE EMULSION, FOSAMAX, ADHESIVE TAPE AND GAUZE, PERCOCET.  PERTINENT LABORATORY RESULTS AND RADIOGRAPHIC FINDINGS: Serum glucose is 101. The rest of the BMP is normal. Hepatic enzymes are  normal. Troponin is negative. White blood cell count is normal. Hemoglobin is 14.9, hematocrit is 46.5, INR is 1.8.   A chest x-ray shows shallow inspiration with atelectasis in the lung bases. There are some diffuse emphysematous changes as well, and cardiac enlargement.   CT with angiography of the chest is negative for pulmonary embolus. There is a small right and atelectasis at the lung bases. Fatty infiltration is also seen on the liver. Cholelithiasis is also visualized.   Ultrasound of the right upper quadrant shows cholelithiasis without inflammatory changes.   PHYSICAL EXAMINATION:  VITAL SIGNS: Temperature is 98.7, pulse 88, respirations 16, blood pressure 102/56, pulse oximetry and 93 to 95% on 2 L of oxygen via nasal cannula.  GENERAL: The patient is alert and oriented x 3, in no apparent distress.  HEENT: Normocephalic, atraumatic. Pupils equal, round, and reactive light and accommodation. Extraocular movements are intact. Mucous membranes are moist.  NECK: Trachea is midline. No adenopathy.  CARDIOVASCULAR: Regular rate and rhythm. Normal S1, S2. No rubs, clicks, or murmurs appreciated.  LUNGS: Clear to auscultation bilaterally. Normal effort and excursion.  ABDOMEN: Positive bowel sounds. Soft, nontender, nondistended. No hepatosplenomegaly.  GENITOURINARY: Deferred.  MUSCULOSKELETAL: The patient moves all 4 extremities equally. Strength is 5/5 in upper and lower extremities bilaterally.  SKIN: No rashes or lesions.  EXTREMITIES: No clubbing, cyanosis, or edema.  NEUROLOGIC: Cranial nerves II through XII grossly intact.  PSYCHIATRIC: Mood is normal. Affect is congruent.   ASSESSMENT AND PLAN: This is a 74 year old female with chest pain.  1.  Chest pain is atypical. It has been mostly right-sided and has lasted all day, but the patient has risk factors such as obesity and high blood and hypercholesterolemia that dictate the need for rule out of ischemia. She has no acute EKG  changes. Her first set of enzymes is negative. We will continue to follow biomarkers over the next 8 hours.  2.  Atrial fibrillation patient is subtherapeutic on her INR. She admits that her doctors have been trying to adjust her INR dosing for quite some time now. I have chosen to start the patient on treatment dose of Lovenox so that we can transition her back to her anticoagulant of choice, which is warfarin. We will continue oral diltiazem for now.  3.  Hypertension. This is acceptable for age at this time. We will continue hydrochlorothiazide per her home regimen.  4.  Asthma, stable. Continue Advair and albuterol.  5. Obesity.  BMI 69.  Advised low fat, calorie reduced diet. 6.  Deep vein thrombosis prophylaxis. Sequential compression devices and compression stockings in addition to treatment dose of Lovenox as stated above.  7.  Gastrointestinal prophylaxis with Pantoprazole.  8.  The patient is a DNR. She does not want resuscitation.   TIME SPENT ON ADMISSION ORDERS AND PATIENT CARE: Approximately 35 minutes.    ____________________________ Norva Riffle. Marcille Blanco, MD msd:MT D: 03/20/2014 93:57:01 ET T: 03/20/2014 06:32:46 ET JOB#: [id number]  cc: Norva Riffle. Marcille Blanco, MD, <Dictator> Norva Riffle Ben Sanz MD ELECTRONICALLY SIGNED 03/20/2014 23:40

## 2014-10-13 NOTE — Consult Note (Signed)
PATIENT NAME:  Laura Gonzalez, Laura Gonzalez MR#:  [id number] DATE OF BIRTH:  07-29-1940  DATE OF CONSULTATION:  06/16/2014  REFERRING PHYSICIAN:  Laurene Footman, MD CONSULTING PHYSICIAN:  Juluis Mire, MD  PRIMARY CARE DOCTOR: Hewitt Blade. Sarina Ser, MD   CHIEF COMPLAINT: Consultation requested for atrial fibrillation with a rapid ventricular rate of 133 beats per minute.   HISTORY OF PRESENT ILLNESS: A 74 year old Caucasian female with a past medical history of chronic paroxysmal atrial fibrillation, history of hypertension, bronchial asthma, hyperlipidemia, admitted to the orthopedic floor for undergoing a right total knee replacement, and she underwent a right total knee replacement on 06/13/2014, was in her usual state of health until last night. She felt palpitations, upon which an EKG was obtained by the nursing staff and was found to have atrial fibrillation with ventricular rate of 133 beats per minute. Hence, a medical consultation was requested by orthopedic attending on-call. The patient denies any symptoms such as chest pain, shortness of breath. She felt some palpitations which lasted for some time and currently resolved. As mentioned earlier, she does have a history of chronic paroxysmal atrial fibrillation and she is on chronic anticoagulation and she takes Cardizem regularly for the chronic atrial fibrillation. She mentions that she did not take her p.r.n. Cardizem in the last 1 to 2 days. She received 1 dose of p.o. Cardizem 60 mg at around 10:30, following which her palpitations are under control now and she denies any complaints such as chest pain, shortness of breath, palpitations, dizziness, loss of consciousness at this time.    PAST MEDICAL HISTORY:  1. Paroxysmal atrial fibrillation on chronic anticoagulation.  2. Hypertension.  3. Hyperlipidemia.  4. Bronchial asthma.   ALLERGIES: STATINS.   MEDICATIONS: 1. Advair Diskus 100/50 mcg 1 puff twice a day.  2. Albuterol/ipratropium  inhaled nebulization 2.5/0.5 mg once a day.  3. Aspirin 81 mg tablet 1 tablet once a day.  4.    HTZ 25 mg tablet 1 tablet once a day.  5. Citracal 1 tablet orally once a day. 6. Diltiazem 60 mg tablet 1 tablet orally every 6 hours as needed for palpitations. 7. Diltiazem CD 300 mg 24 hour tablet 1 tablet orally once a day.  8. Docusate sodium 100 mg capsule 1 capsule orally 2 times a day as needed.  9. Fish oil capsules 1200 mg capsules orally 1 capsule orally once a day.  10. Hydrochlorothiazide 25 mg tablet 1 tablet orally once a day. 11. Magnesium oxide 400 mg tablet 1 tablet orally once a day.  12. Montelukast 10 mg tablet 1 tablet orally once a day. 13. Multivitamin once a day.  14. Potassium chloride 20 mEq tablet 1 tablet orally once a day.  15. ProAir as needed.  16. Theophylline extended release 300 mg oral tablet 1 tablet every 12 hours.  17. Tylenol Extra Strength as needed.  18. Vitamin B12 at 1000 mcg 1 tablet orally once a day.  19. Vitamin D3 at 5000 units 1 tablet orally once a day.  20. Warfarin 5 mg tablet 1 tablet orally once a day.  21. Zetia 10 mg tablet 1 tablet orally once a day.   REVIEW OF SYSTEMS:  CONSTITUTIONAL: Negative for fever, fatigue. No pain.  EYES: Negative for blurred vision, double vision. No pain. No redness. No inflammation.  EARS, NOSE, AND THROAT: Negative for tinnitus, ear pain, hearing loss.  RESPIRATORY: Negative for cough, wheezing, hemoptysis, painful respiration.  CARDIOVASCULAR: Negative for chest pain. She  did feel some palpitations, but no orthopnea. No pedal edema. No syncope or dizziness.  GASTROINTESTINAL: Negative for nausea, vomiting, diarrhea, abdominal pain, or GERD symptoms.  GENITOURINARY: Negative for dysuria, hematuria, or frequency.  ENDOCRINE: Negative for polyuria, nocturia.  HEMATOLOGIC: Negative for anemia, easy bruising or bleeding.  INTEGUMENTARY: Negative for acne, skin rash, or lesions.  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 Pain under control with pain medications.  NEUROLOGICAL: Negative for focal weakness or numbness. No CVA, TIA or seizure disorder.  PSYCHIATRIC: Negative for anxiety, insomnia, depression.   PHYSICAL EXAMINATION: VITAL SIGNS: Temperature 98.5 degrees Fahrenheit, pulse rate 146 beats per minute, respirations 20 per minute, blood pressure 108/68, oxygen saturation 92 per minute.  GENERAL: Well-developed, well-nourished, alert and awake, oriented, comfortably lying in the bed, not in any acute distress.  HEAD: Atraumatic, normocephalic.  EYES: Pupils are equal, react to light and accommodation. No conjunctival pallor. No scleral icterus. Extraocular movements intact.  NOSE: No nasal lesions. No nasal drainage.  EARS: No drainage or external lesions.  ORAL CAVITY: No mucosal lesions.  NECK: Supple. No JVD. No thyromegaly. No carotid bruit. Range of motion of neck normal.  RESPIRATORY: Good respiratory effort. Bilateral vesicular breath sounds present. No rales or rhonchi.  CARDIOVASCULAR: S1, S2 regularly irregular. No murmurs, gallops. Peripheral pulses equal,   pedal pulses, no peripheral edema.  GASTROINTESTINAL: Abdomen is soft and nontender. No hepatosplenomegaly. Bowel sounds present and equal in all 4 quadrants.  MUSCULOSKELETAL: Right knee in supportive cast following surgery.  SKIN: Within normal limits.   LYMPHATIC: No cervical lymphadenopathy.  VASCULAR: Good dorsalis pedis posterior.  NEUROLOGICAL: Alert, awake, and oriented, sleepy. Cranial nerves II through XII nerves grossly intact. Motor strength is 5/5 in both upper and lower extremities.  PSYCHIATRIC: Judgment and insight are adequate.   EKG: Atrial fibrillation with ventricular rate of 149 beats per minute.   ASSESSMENT AND PLAN: A 74 year old Caucasian female with a past medical history of paroxysmal atrial fibrillation, hypertension, hyperlipidemia, bronchial asthma,  admitted to the orthopedic service to undergo a right total knee replacement. The patient is status post right total knee replacement, was found to have some palpitations and EKG showed atrial fibrillation with ventricular rate of 149 beats per minute. The patient received p.o. Cardizem, following which her palpitations resolved and  currently stable.  Atrial fibrillation with rapid ventricular rate in a patient with a history of chronic paroxysmal  atrial fibrillation. The patient stable clinically. Will continue Cardizem for rate control. The patient is on anticoagulation, warfarin. Continue same. Cardiology consult pending at this time. Follow up with cardiology recommendations. Will place the patient on telemetry monitoring. Will get a CK, CK-MB and cardiac troponins every 4 hours x3 and check BMP to total rule out any electrolyte imbalances.   TIME SPENT FOR CONSULTATION: 45 minutes.   Thank you for the consultation.      ____________________________ Juluis Mire, MD enr:lm D: 06/16/2014 02:46:52 ET T: 06/16/2014 05:22:16 ET JOB#: [id number]  cc: Juluis Mire, MD, <Dictator> Laurene Footman, MD Hewitt Blade. Sarina Ser, MD  Juluis Mire MD ELECTRONICALLY SIGNED 06/17/2014 20:30

## 2014-10-13 NOTE — Discharge Summary (Signed)
PATIENT NAME:  Laura Gonzalez, Laura Gonzalez MR#:  [id number] DATE OF BIRTH:  03/11/1941  DATE OF ADMISSION:  03/21/2014 DATE OF DISCHARGE:  03/26/2014  FINAL DIAGNOSES:  1. Pleuritic chest pain, right-sided.  2. Acute chronic obstructive pulmonary disease exacerbation.  3. Pneumonia secondary to atelectasis from the chest pain.  4. Atrial fibrillation.  5. Sleep apnea.  6. Hypertension.  7. Gastroesophageal reflux disease.  8. History of tracheotomy.  9. Tonsillectomy.  10. History of lumpectomy.  11. Hernia repairs. 12. Bilateral rotator cuff surgery.  13. External fixation, right wrist.   HISTORY AND PHYSICAL: Please see dictated admission history and physical.   HOSPITAL COURSE: The patient was admitted with right-sided chest discomfort, which was persistent and appeared to be pleuritic versus musculoskeletal. Gonzalez CT angiogram was negative. Cardiac enzymes were negative. Cardiology saw the patient, and this was felt to be atypical in nature. She had hepatobiliary scan of gallbladder which was also unremarkable. Pulmonary saw the patient as the patient developed acute respiratory failure and profound hypoxia. Chest x-rays revealed evidence of atelectasis, question pneumonia. She was placed empirically on antibiotics, and continued on steroids. She had resolution of her chest pain with steroids. Her breathing slowly improved, although she continued to require oxygen at 3 liters nasal cannula. She was ambulating with physical therapy, and it looked like she would benefit from rehabilitation. So the plan is for her to [facility] to complete rehabilitation and hopefully can wean off her oxygen, which she had not been requiring prior to hospitalization.   DISCHARGE INSTRUCTIONS: At this point, she will be discharged in stable condition with physical activity, up as tolerated with assistance. She will follow [facility] no added salt diet. She has had physical therapy and occupational therapy  evaluate and treat the patient. She will need [facility] MET-B, CBC, and INR in 2 days with the results to the nursing home physician, and she will follow up with the nursing home physician.   DISCHARGE MEDICATIONS:  1. Advair 100/50 one puff b.i.d.  2. Theophylline 300 mg p.o. b.i.d.  3. Multivitamin 1 p.o. daily.  4. Cardizem 60 mg once Gonzalez day as needed for atrial fibrillation flare-ups.  5. Fish oil 1200 mg p.o. daily. 6. Hydrochlorothiazide 25 mg p.o. daily. 7. Loratadine 10 mg p.o. daily.  8. Magnesium oxide 400 mg p.o. daily.  9. Singulair 10 mg p.o. daily. 10. Vitamin B12 1000 mcg p.o. daily.  11. Zetia 10 mg p.o. daily.  12. PreserVision 1 p.o. daily.  13. Cardizem CD 300 mg p.o. daily.  14. Potassium 20 mEq p.o. t.i.d. 15. Vitamin D 3000 units p.o. daily.  16. Coumadin 3 mg p.o. daily.  17. Prednisone starting at 60 mg daily, decreasing by 10 mg every 2 days.  18. Spiriva 1 capsule inhaled daily.  19. DuoNeb SVN 3 mL inhaled 4 times Gonzalez day.  20. Colace 100 mg p.o. b.i.d. as needed for constipation.  21. Levaquin 500 mg p.o. daily x 7 days.  22. Ranitidine 50 mg p.o. b.i.d.   The patient should be on auto CPAP when sleeping.   TIME SPENT ON DISCHARGE: 35 minutes.   ____________________________ Bert J. Klein III, MD bjk:JT D: 03/26/2014 08:39:28 ET T: 03/26/2014 08:51:57 ET JOB#: [id number]  cc: Bert J. Klein III, MD, <Dictator> BERT KLEIN MD ELECTRONICALLY SIGNED 04/01/2014 9:37 

## 2014-10-13 NOTE — Consult Note (Signed)
PATIENT NAME:  Laura Gonzalez, Laura Gonzalez MR#:  [id number] DATE OF BIRTH:  April 30, 1941  DATE OF CONSULTATION:  03/20/2014  REFERRING PHYSICIAN:  Caryl Comes.  PRIMARY CARE PHYSICIAN:  Walker.  CARDIOLOGIST:  Javier Docker Ubaldo Glassing, [place]:  Isaias Cowman, MD  CHIEF COMPLAINT: Chest discomfort.   HISTORY OF PRESENT ILLNESS: The patient is a 74 year old female with history of hypertension, hyperlipidemia, and atrial fibrillation. The patient is awaiting total left knee replacement surgery. She reports that she was in her usual state of health until last evening when she experienced substernal chest discomfort, which radiated to her back. She reports that there are some sharp-like features to the discomfort with some radiation to her right shoulder, worse with deep inspiration or cough. She presented to [facility] Emergency Room where EKG did not show any acute ischemic ST-T wave changes. The patient has ruled out for myocardial infarction by CPK, isoenzymes, and troponin. The patient reports her symptoms are somewhat better today. There were relieved primarily with Dilaudid.   PAST MEDICAL HISTORY:  1.  Atrial fibrillation.  2.  Hypertension.  3.  Hyperlipidemia.  4.  Obstructive sleep apnea.  5.  Gastroesophageal reflux disease.  6.  Asthma.  7.  Sleep apnea, on CPAP.  8.  Obesity.  9.  Osteoarthritis.   MEDICATIONS: Warfarin 4 mg daily, diltiazem 300 mg daily, hydrochlorothiazide 25 mg daily, Zetia 10 mg daily, Advair Diskus 100/50 one puff b.i.d., potassium chloride 20 mEq t.i.d., theophylline 300 mg b.i.d., ProAir 2 inhalations q. 4 hours p.r.n. Singulair 10 mg daily.   SOCIAL HISTORY: The patient is a widow. She lives at home. She denies tobacco abuse.   FAMILY HISTORY: No immediate family history of coronary artery disease or myocardial infarction.   REVIEW OF SYSTEMS: CONSTITUTIONAL: No fever or chills.  EYES: No blurry vision.  EARS: No hearing loss.  RESPIRATORY: Shortness of  breath due to underlying asthma.  CARDIOVASCULAR: Chest pain as described above.  GASTROINTESTINAL: No nausea, vomiting, or diarrhea.  GENITOURINARY: No dysuria or hematuria.  ENDOCRINE: No polyuria or polydipsia.  MUSCULOSKELETAL: No arthralgias or myalgias.  NEUROLOGICAL: No focal muscle weakness or numbness.  PSYCHOLOGICAL: No depression or anxiety.   PHYSICAL EXAMINATION:  VITAL SIGNS: Blood pressure 112/65, pulse 85, respirations 18, temperature 98.4, pulse oximetry 89%.  HEENT: Pupils equal, reactive to light and accommodation.  NECK: Supple without thyromegaly.  LUNGS: Decreased breath sounds in both bases.  HEART: Normal JVP. Normal PMI. Regular rate and rhythm. Normal S1, S2. No appreciable gallop, murmur, or rub.  ABDOMEN: Soft and nontender. Pulses were intact bilaterally.  MUSCULOSKELETAL: Normal muscle tone.  NEUROLOGIC: The patient is alert and oriented x 3. Motor and sensory both grossly intact.   IMPRESSION: A 74 year old female with multiple cardiovascular risk factors who presents with chest pain with atypical features which sounds noncardiac in nature with negative cardiac enzymes and troponin. EKG is nondiagnostic.   RECOMMENDATIONS:  1.  Agree with current therapy.  2.  Would defer full-dose anticoagulation.  3.  Would proceed with Lexiscan sestamibi study in the a.m.  4.  Further recommendations pending Lexiscan sestamibi result.    ____________________________ Isaias Cowman, MD ap:lt D: 03/20/2014 13:53:35 ET T: 03/20/2014 14:40:02 ET JOB#: [id number]  cc: Isaias Cowman, MD, <Dictator> Isaias Cowman MD ELECTRONICALLY SIGNED 04/04/2014 12:55

## 2014-10-13 NOTE — Consult Note (Signed)
PATIENT NAME:  Laura Gonzalez, LISH MR#:  [id number] DATE OF BIRTH:  Apr 28, 1941  DATE OF CONSULTATION:  06/16/2014  REFERRING PHYSICIAN:  Juluis Mire, MD CONSULTING PHYSICIAN:  Corey Skains, MD  REASON FOR CONSULTATION: Atrial fibrillation with rapid ventricular rate with elevated troponin. Hypertension with knee surgery.   CHIEF COMPLAINT: "I am frustrated and short of breath."  HISTORY OF PRESENT ILLNESS: This 74 year old female with a right total knee replacement has had nonvalvular paroxysmal atrial fibrillation in the past in normal sinus rhythm on appropriate medication management, who has had an acute onset of abdominal discomfort and, constipation, for which appears to have driven her to an atrial fibrillation with rapid ventricular rate. The patient had rapid ventricular rate and an elevation of troponin of 0.51, consistent with demand ischemia rather than acute coronary syndrome at this time due to no evidence of true chest pain. EKG currently shows atrial fibrillation with rapid ventricular rate, with lower blood pressure. She feels comfortable at this time without any further significant evidence of chest discomfort or shortness of breath, but heart rate is still elevated. She has been on anticoagulation after her surgery and will need to continue it as well. The remainder of the review of systems is negative for vision change, ringing in the ears, hearing loss, cough, congestion, heartburn, nausea, vomiting, diarrhea, bloody stools, stomach pain. Review of systems is positive for extremity pain and leg weakness. Negative for, syncope, dizziness, diaphoresis, skin lesions, skin rashes, frequent urination, or urination at night.   PAST MEDICAL HISTORY:  1.  History of paroxysmal atrial fibrillation.  2.  Essential hypertension.   FAMILY HISTORY: The patient does have hypertension in the family, but no other cardiovascular disease.   SOCIAL HISTORY: Currently denies alcohol or  tobacco use.   ALLERGIES: As listed.   MEDICATIONS: As listed.   PHYSICAL EXAMINATION:  VITAL SIGNS: Blood pressure is 106/70, bilaterally. Heart rate is 130 upright and reclining, and irregular.  GENERAL: She is a well-appearing female, in no acute distress.  HEAD, EYES, EARS, NOSE, THROAT: No icterus, thyromegaly, ulcers, hemorrhage, or xanthelasma.  CARDIOVASCULAR: Irregularly irregular. Normal S1 and S2. No apparent murmur, gallop, or rubs. PMI is diffuse. Carotid upstroke normal without bruit. Jugular venous pressure normal.  LUNGS: Diffuse wheezes.  ABDOMEN: Soft, nontender. Cannot assess hepatosplenomegaly or masses due to increased abdominal girth.  EXTREMITIES: Show 2+ radial, femoral, dorsal pedal pulses with significant pain in the right leg/knee, status post surgery.  NEUROLOGIC: She is oriented to time, place, and person, with normal mood and affect.   ASSESSMENT: A 74 year old female with paroxysmal nonvalvular atrial fibrillation with rapid ventricular rate, essential hypertension, and elevation of troponin, status post total knee surgery, needing further treatment options.   RECOMMENDATIONS:  1.  Continue diltiazem long acting for heart rate control, with addition of metoprolol for better heart rate control and possible spontaneous conversion to normal rhythm.  2.  Reinstatement of Coumadin for further risk reduction in stroke with atrial fibrillation with goal INR between 2 to 3. No further intervention of elevated troponin more consistent with demand ischemia at this time.  Essential hypertension control, with medications listed as above.  Continue rehabilitation without restriction at this time.    ____________________________ Corey Skains, MD bjk:MT D: 06/16/2014 08:19:00 ET T: 06/16/2014 09:04:45 ET JOB#: [id number]  cc: Corey Skains, MD, <Dictator> Corey Skains MD ELECTRONICALLY SIGNED 06/20/2014 13:48

## 2014-10-17 NOTE — Discharge Summary (Signed)
PATIENT NAME:  Laura Gonzalez, Laura Gonzalez MR#:  [id number] DATE OF BIRTH:  1940-07-23  DATE OF ADMISSION:  06/13/2014 DATE OF DISCHARGE:  06/19/2014   ADMITTING DIAGNOSIS: Degenerative arthrosis, right knee.  DISCHARGE DIAGNOSIS: Degenerative arthrosis, right knee.   SURGERY: On 06/13/2014, had a right total knee arthroplasty.   SURGEON: Kathrene Alu., MD  ASSISTANT:  Anitra Lauth, PA   ANESTHESIA: Spinal.   ESTIMATED BLOOD LOSS: 200 mL   TOURNIQUET TIME: 120 minutes.   DRAINS: None.   IMPLANTS: Stryker Triathlon #4 tibia, #5 posterior stabilized femoral component, 9 mm tibial tray; 35 patella, 10 mm thick.   HISTORY: Laura Gonzalez is a 74 year old female who failed conservative measures and treatment for a degenerative arthrosis of her right knee. She elected to proceed with a right total knee arthroplasty.   PHYSICAL EXAMINATION:  GENERAL: Well developed, well nourished, no acute distress.  EXTREMITIES: Neurovascularly intact  right lower extremity. Negative Homans. Incision, right knee, well approximated with staples, no drainage; dressing intact. TENS unit on. There is 4/5 straight leg raise, and 4/5 ankle dorsiflexion and plantarflexion.   HOSPITAL COURSE: After initial admission on 06/13/2014, the patient underwent right total knee arthroplasty the same day. She had good pain control afterwards, and was transported to the orthopedic floor. On postoperative day 1, 06/14/2014, hemoglobin was 13.8. Physical therapy was begun on that day. On postoperative day 2, 06/15/2014, dressing was changed. Hemoglobin 13.5. Pain stable. Mildly hypoxic. On postoperative day 3, medicine was consulted for her hypoxia. Vital signs were stable. Cardiology was consulted and recommended continuing diltiazem and adding metoprolol for heart rate control. She was in atrial fibrillation with rapid ventricular rate. On 06/17/2014 she was in stable condition with shortness of breath. Left pleural effusion.  Possible pulmonary edema. She was transported to the CCU for a Cardizem drip.  On 06/18/2014, she was in the CCU on a Cardizem drip. Hemoglobin was 13.0. On BiPAP at night. On 06/19/2014, she was transported back to the orthopedic floor, and was stable for discharge. Cleared by cardiology for discharge.   CONDITION AT DISCHARGE: Stable.   DISPOSITION: The patient was sent to rehab.   DISCHARGE INSTRUCTIONS: The patient will follow up in [facility] in 2 weeks for staple removal. She will do physical therapy and weight bear as tolerated on the right lower extremity. She may change the dressing once daily and on an as-needed basis. Regular diet.   Please see discharge instructions for a complete list of discharge medications.    ____________________________ Briauna Gilmartin M. Tretha Sciara, NP amb:MT D: 06/19/2014 14:27:16 ET T: 06/19/2014 14:54:59 ET JOB#: [id number]  cc: Dariyon Urquilla M. Tretha Sciara, NP, <Dictator> Kem Kays Jaeleen Inzunza FNP ELECTRONICALLY SIGNED 06/22/2014 9:21

## 2014-10-17 NOTE — Op Note (Signed)
PATIENT NAME:  Laura Gonzalez, Laura Gonzalez MR#:  [id number] DATE OF BIRTH:  [birth year]  DATE OF PROCEDURE:  06/13/2014  PREOPERATIVE DIAGNOSIS: Severe degenerative arthritis, right knee.   POSTOPERATIVE DIAGNOSIS: Severe degenerative arthritis, right knee.   OPERATION PERFORMED: Stryker Triathlon total knee replacement on the right.   SURGEON: Kathrene Alu., MD  FIRST ASSISTANT: Anitra Lauth PA-C.   ANESTHESIA: Spinal.   HISTORY OF PRESENT ILLNESS: The patient had a long history of symptomatic degenerative arthritis of the right knee. She had been refractory to steroid injections and Synvisc-One. Ultimately, she was brought in brought in for surgery due to her persistent and worsening discomfort.   DESCRIPTION OF PROCEDURE: The patient was taken to the operating room, where satisfactory spinal anesthesia was achieved. A Foley catheter was inserted. A tourniquet was applied to the patient's right upper thigh, and the right lower extremity was prepped and draped in the usual fashion for a total knee replacement.   Next, the right lower extremity was exsanguinated and the tourniquet was inflated. A  longitudinal incision was made over the anterior aspect of the right knee.   Dissection was carried down through the subcutaneous tissue onto the extensor mechanism. I divided the extensor mechanism along the medial border of the patella and proximally between the quadriceps tendon and the vastus medialis. Distally, I extended my incision along the medial border of the patellar tendon. The knee was flexed as the patella was everted and dislocated laterally. The patient had erosion of the medial and lateral compartments as well as the patellofemoral compartment.   I went ahead and drilled a hole in the central portion of the distal femur and inserted an intramedullary rod. On the rod was the cutting block for the distal femur. It was positioned appropriately for an 8 mm cut. An 8 mm cut on the distal  femur was made and then the intramedullary rodding and cutting block removed. I then used the appropriate distal femoral guide to determine the size of the femoral component. It was felt that probably a #5 femoral component was going to the appropriate size. I went ahead and made drill holes for the #5 cutting block and then impacted the #5 cutting block and distal femur and made my anterior and posterior cuts along with the anterior and posterior chamfer cuts. I then impacted the appropriate jig on the distal femur to allow me to notch out the intercondylar area. I went ahead and did this, removing the anterior and posterior cruciates. Next, the knee was hyperflexed. The external alignment jig was applied. It was positioned for a 2 mm cut off the lowest portion of the medial tibial plateau. The cutting block was fixed to the proximal femur and then the proximal tibial cut was made.   We tried to insert a spacer in the joint with the knee extended, but was too tight, so we went ahead and removed several more millimeters of bone from the proximal tibia. We were then able to get our trial spacer in.   Next, I went ahead and inserted all the trials. We used a #4 tibial baseplate and a 9 mm polyethylene spacer along with a #5 femoral component. The knee seemed to move well and was quite stable. It, indeed, did come to full extension.   I removed the trials, except for the tibial baseplate. The tibial baseplate was appropriately positioned, and then I went ahead and made my cruciate cut in the proximal tibia. We also removed  about 6 mm of bone from the retropatellar surface using an oscillating saw and made the holes for our retropatellar button. With the button in place, the patella seemed to track well.   All the trials were removed. Jet lavage was used to remove blood from the cut cancellous surface. I then sequentially implanted the components. I went ahead and impacted the #4 Triathlon tibial baseplate onto  the proximal tibia with methylmethacrylate. The excess glue was removed. I then impacted the #5 Triathlon  posterior stabilized femoral component on the distal femur. This was done with methylmethacrylate and again, excess glue was removed. The 9 mm size 4 tibial trial was inserted. The knee was extended, and I went ahead and cemented the asymmetric patella to the retropatellar surface. This was an 81mm patella that was 10 mm thick.   I then removed the trial spacer and inserted the permanent size 4, 9 mm thick tibial bearing insert. The knee, again, came to full extension. Tourniquet was released. It was up 2 hours. Bleeding was controlled with coagulation cautery. I injected about 50 mL of Exparel  into the wound. I then closed the capsule with #1 and 0 Ethibond suturesand the subcutaneous with 2-0 Vicryl and the skin with skin staples. Sterile dressing was applied along with 4 TENS pads. A Polar Care cooling pad was applied. The patient was transferred to her stretcher bed. She was taken to the recovery room in satisfactory condition.   ESTIMATED BLOOD LOSS: About 200 mL.    ____________________________ Kathrene Alu., MD hbk:mw D: 06/13/2014 11:33:09 ET T: 06/13/2014 13:27:14 ET JOB#: [id number]  cc: Kathrene Alu., MD, <Dictator> Vilinda Flake, Brooke Bonito MD ELECTRONICALLY SIGNED 07/11/2014 18:58

## 2014-10-21 NOTE — Consult Note (Signed)
PATIENT NAME:  Laura Gonzalez, Laura Gonzalez MR#:  [id number] DATE OF BIRTH:  May 18, 1941  DATE OF CONSULTATION:  10/10/2014  REFERRING PHYSICIAN:  Aldean Jewett, MD  CONSULTING PHYSICIAN:  Corey Skains, MD  REASON FOR CONSULTATION: A 74 year old with paroxysmal atrial fibrillation with chest discomfort.   CHIEF COMPLAINT: "I have chest pain."   HISTORY OF PRESENT ILLNESS: This 74 year old female with history of paroxysmal atrial fibrillation who was recently in the Emergency Room for chest pain, weakness and fatigue. She had a troponin which was normal and an EKG showing normal sinus rhythm. The patient had no evidence of troponin elevation and she had felt well and was discharged to home. Then she had worsening episodes of chest pain radiating into her back. This also was associated with significant new tachycardia and palpitations. She was seen in the Emergency Room with atrial fibrillation with rapid ventricular rate. She was given intravenous diltiazem with heart rate control and previously she has been on diltiazem 300 mg which she had not taking this morning; therefore, we would recommend it at this time. The patient also has had on warfarin for further risk reduction in stroke with atrial fibrillation, which she has taken and had been under control. The patient now feels slightly better, but still has some mild amount of chest heaviness with no EKG changes.   REVIEW OF SYSTEMS:  The remainder review of systems negative for vision change, ringing in the ears, hearing loss, cough, congestion, heartburn, nausea, vomiting, diarrhea, bloody stools, stomach pain, extremity pain, leg weakness, cramping of the buttocks, known blood clots, headaches, blackouts, dizzy spells, nosebleeds, congestion, trouble swallowing, frequent urination, urination at night, muscle weakness, numbness, anxiety, depression, skin lesions or skin rashes.   PAST MEDICAL HISTORY:  1.  Essential hypertension.  2.  Mixed  hyperlipidemia.  3.  Paroxysmal atrial fibrillation.   FAMILY HISTORY: Family members with diabetes and hypertension, but no evidence of early onset of cardiovascular disease.   SOCIAL HISTORY: Currently denies alcohol or tobacco use.   ALLERGIES: SHE HAS ALLERGIES AS LISTED.   MEDICATIONS: As listed.   PHYSICAL EXAMINATION: VITAL SIGNS: Blood pressure is 134/68; she has heart rate of 105 and irregular.  GENERAL: She is a well-appearing elderly female in no acute distress.  HEENT: No icterus, thyromegaly, ulcers, hemorrhage or xanthelasma.  CARDIOVASCULAR: Irregularly irregular and rapid, PMI is diffuse.  NECK: Carotid upstroke normal without bruit, jugular venous pressure not seen.  LUNGS: Have expiratory wheezes, slightly decreased breath sounds in the bases.  ABDOMEN: Soft, nontender; cannot assess hepatosplenomegaly or masses due to increased abdominal girth.  EXTREMITIES: She had 2+ radial, femoral, trace dorsal pedal pulses with 1+ lower extremity edema, no cyanosis, clubbing or ulcers.  NEUROLOGIC: He is oriented to time, place, and person, with normal mood and affect.   ASSESSMENT: A 74 year old female with essential hypertension and paroxysmal atrial fibrillation now with paroxysmal atrial fibrillation with rapid ventricular rate and atypical chest discomfort without evidence of myocardial infarction.   RECOMMENDATIONS: 1.  Continue diltiazem long acting and add metoprolol 25 mg twice per day for better heart rate control of atrial fibrillation.  2.  Continue warfarin for further risk reduction in stroke with atrial fibrillation.  3.  Begin ambulation and follow for any further need and adjustments of medications.  4.  No further cardiac intervention at this time due to no evidence of myocardial infarction, with normal troponin, both yesterday and today with atypical chest discomfort.  5.  Possible discharge to  home if ambulating well with follow-up with in cardiology office  with Dr. Ubaldo Glassing, tomorrow for further adjustments.    ____________________________ Corey Skains, MD bjk:nt D: 10/10/2014 22:25:59 ET T: 10/10/2014 23:38:24 ET JOB#: [id number]  cc: Corey Skains, MD, <Dictator> Corey Skains MD ELECTRONICALLY SIGNED 10/18/2014 10:33

## 2014-12-10 ENCOUNTER — Encounter: Payer: Self-pay | Admitting: Emergency Medicine

## 2014-12-10 ENCOUNTER — Emergency Department: Payer: Medicare Other

## 2014-12-10 ENCOUNTER — Emergency Department
Admission: EM | Admit: 2014-12-10 | Discharge: 2014-12-10 | Disposition: A | Discharge status: Home / Self Care | Payer: Medicare Other | Source: Home / Self Care | Attending: Emergency Medicine | Admitting: Emergency Medicine

## 2014-12-10 DIAGNOSIS — Z79899 Other long term (current) drug therapy: Secondary | ICD-10-CM

## 2014-12-10 DIAGNOSIS — Z86010 Personal history of colonic polyps: Secondary | ICD-10-CM

## 2014-12-10 DIAGNOSIS — Z888 Allergy status to other drugs, medicaments and biological substances status: Secondary | ICD-10-CM

## 2014-12-10 DIAGNOSIS — K5792 Diverticulitis of intestine, part unspecified, without perforation or abscess without bleeding: Secondary | ICD-10-CM

## 2014-12-10 DIAGNOSIS — Z7951 Long term (current) use of inhaled steroids: Secondary | ICD-10-CM

## 2014-12-10 DIAGNOSIS — Z800 Family history of malignant neoplasm of digestive organs: Secondary | ICD-10-CM

## 2014-12-10 DIAGNOSIS — K5732 Diverticulitis of large intestine without perforation or abscess without bleeding: Secondary | ICD-10-CM | POA: Insufficient documentation

## 2014-12-10 DIAGNOSIS — Z85828 Personal history of other malignant neoplasm of skin: Secondary | ICD-10-CM

## 2014-12-10 DIAGNOSIS — A419 Sepsis, unspecified organism: Secondary | ICD-10-CM | POA: Diagnosis not present

## 2014-12-10 DIAGNOSIS — Z9889 Other specified postprocedural states: Secondary | ICD-10-CM

## 2014-12-10 DIAGNOSIS — Z8249 Family history of ischemic heart disease and other diseases of the circulatory system: Secondary | ICD-10-CM

## 2014-12-10 DIAGNOSIS — Z880 Allergy status to penicillin: Secondary | ICD-10-CM

## 2014-12-10 DIAGNOSIS — Z7901 Long term (current) use of anticoagulants: Secondary | ICD-10-CM | POA: Insufficient documentation

## 2014-12-10 DIAGNOSIS — Z6838 Body mass index (BMI) 38.0-38.9, adult: Secondary | ICD-10-CM

## 2014-12-10 DIAGNOSIS — J9601 Acute respiratory failure with hypoxia: Secondary | ICD-10-CM | POA: Diagnosis present

## 2014-12-10 DIAGNOSIS — I480 Paroxysmal atrial fibrillation: Secondary | ICD-10-CM | POA: Diagnosis present

## 2014-12-10 DIAGNOSIS — K5900 Constipation, unspecified: Secondary | ICD-10-CM | POA: Diagnosis present

## 2014-12-10 DIAGNOSIS — Y95 Nosocomial condition: Secondary | ICD-10-CM | POA: Diagnosis present

## 2014-12-10 DIAGNOSIS — Z8261 Family history of arthritis: Secondary | ICD-10-CM

## 2014-12-10 DIAGNOSIS — K648 Other hemorrhoids: Secondary | ICD-10-CM | POA: Diagnosis present

## 2014-12-10 DIAGNOSIS — Z808 Family history of malignant neoplasm of other organs or systems: Secondary | ICD-10-CM

## 2014-12-10 DIAGNOSIS — M810 Age-related osteoporosis without current pathological fracture: Secondary | ICD-10-CM | POA: Diagnosis present

## 2014-12-10 DIAGNOSIS — K219 Gastro-esophageal reflux disease without esophagitis: Secondary | ICD-10-CM | POA: Diagnosis present

## 2014-12-10 DIAGNOSIS — M1990 Unspecified osteoarthritis, unspecified site: Secondary | ICD-10-CM | POA: Diagnosis present

## 2014-12-10 DIAGNOSIS — J90 Pleural effusion, not elsewhere classified: Secondary | ICD-10-CM | POA: Diagnosis present

## 2014-12-10 DIAGNOSIS — J45909 Unspecified asthma, uncomplicated: Secondary | ICD-10-CM | POA: Diagnosis present

## 2014-12-10 DIAGNOSIS — I482 Chronic atrial fibrillation: Secondary | ICD-10-CM | POA: Diagnosis present

## 2014-12-10 DIAGNOSIS — G4730 Sleep apnea, unspecified: Secondary | ICD-10-CM | POA: Diagnosis present

## 2014-12-10 DIAGNOSIS — I10 Essential (primary) hypertension: Secondary | ICD-10-CM | POA: Insufficient documentation

## 2014-12-10 DIAGNOSIS — Z803 Family history of malignant neoplasm of breast: Secondary | ICD-10-CM

## 2014-12-10 DIAGNOSIS — J189 Pneumonia, unspecified organism: Secondary | ICD-10-CM | POA: Diagnosis present

## 2014-12-10 LAB — COMPREHENSIVE METABOLIC PANEL WITH GFR
ALT: 15 U/L (ref 14–54)
AST: 20 U/L (ref 15–41)
Albumin: 4 g/dL (ref 3.5–5.0)
Alkaline Phosphatase: 89 U/L (ref 38–126)
Anion gap: 10 (ref 5–15)
BUN: 22 mg/dL — ABNORMAL HIGH (ref 6–20)
CO2: 31 mmol/L (ref 22–32)
Calcium: 10.2 mg/dL (ref 8.9–10.3)
Chloride: 100 mmol/L — ABNORMAL LOW (ref 101–111)
Creatinine, Ser: 0.77 mg/dL (ref 0.44–1.00)
GFR calc Af Amer: >60 mL/min
GFR calc non Af Amer: >60 mL/min
Glucose, Bld: 95 mg/dL (ref 65–99)
Potassium: 4.2 mmol/L (ref 3.5–5.1)
Sodium: 141 mmol/L (ref 135–145)
Total Bilirubin: 0.5 mg/dL (ref 0.3–1.2)
Total Protein: 7.6 g/dL (ref 6.5–8.1)

## 2014-12-10 LAB — CBC WITH DIFFERENTIAL/PLATELET
Basophils Absolute: 0.1 10*3/uL (ref 0–0.1)
Basophils Relative: 1 %
EOS ABS: 0.1 10*3/uL (ref 0–0.7)
EOS PCT: 1 %
HEMATOCRIT: 46.3 % (ref 35.0–47.0)
HEMOGLOBIN: 15.3 g/dL (ref 12.0–16.0)
LYMPHS PCT: 12 %
Lymphs Abs: 1.3 10*3/uL (ref 1.0–3.6)
MCH: 30.4 pg (ref 26.0–34.0)
MCHC: 33.1 g/dL (ref 32.0–36.0)
MCV: 91.8 fL (ref 80.0–100.0)
MONO ABS: 1.1 10*3/uL — AB (ref 0.2–0.9)
MONOS PCT: 11 %
Neutro Abs: 7.9 10*3/uL — ABNORMAL HIGH (ref 1.4–6.5)
Neutrophils Relative %: 75 %
PLATELETS: 227 10*3/uL (ref 150–440)
RBC: 5.05 MIL/uL (ref 3.80–5.20)
RDW: 13.7 % (ref 11.5–14.5)
WBC: 10.5 10*3/uL (ref 3.6–11.0)

## 2014-12-10 LAB — URINALYSIS COMPLETE WITH MICROSCOPIC (ARMC ONLY)
BILIRUBIN URINE: NEGATIVE
GLUCOSE, UA: NEGATIVE mg/dL
HGB URINE DIPSTICK: NEGATIVE
Ketones, ur: NEGATIVE mg/dL
Nitrite: NEGATIVE
Protein, ur: NEGATIVE mg/dL
Specific Gravity, Urine: 1.016 (ref 1.005–1.030)
pH: 6 (ref 5.0–8.0)

## 2014-12-10 LAB — LIPASE, BLOOD: Lipase: 44 U/L (ref 22–51)

## 2014-12-10 MED ORDER — CIPROFLOXACIN HCL 500 MG PO TABS
ORAL_TABLET | ORAL | Status: AC
  Administered 2014-12-10: 500 mg via ORAL
  Filled 2014-12-10: qty 1

## 2014-12-10 MED ORDER — FENTANYL CITRATE (PF) 100 MCG/2ML IJ SOLN
INTRAMUSCULAR | Status: AC
  Administered 2014-12-10: 50 ug via INTRAVENOUS
  Filled 2014-12-10: qty 2

## 2014-12-10 MED ORDER — FENTANYL CITRATE (PF) 100 MCG/2ML IJ SOLN
50.0000 ug | Freq: Once | INTRAMUSCULAR | Status: AC
  Administered 2014-12-10: 50 ug via INTRAVENOUS

## 2014-12-10 MED ORDER — IOHEXOL 240 MG/ML SOLN
25.0000 mL | Freq: Once | INTRAMUSCULAR | Status: AC | PRN
  Administered 2014-12-10: 25 mL via ORAL

## 2014-12-10 MED ORDER — CIPROFLOXACIN HCL 500 MG PO TABS: 500.0000 mg | ORAL_TABLET | Freq: Two times a day (BID) | ORAL | Status: DC

## 2014-12-10 MED ORDER — ONDANSETRON HCL 4 MG/2ML IJ SOLN
4.0000 mg | Freq: Once | INTRAMUSCULAR | Status: AC
  Administered 2014-12-10: 4 mg via INTRAVENOUS

## 2014-12-10 MED ORDER — METRONIDAZOLE 500 MG PO TABS
500.0000 mg | ORAL_TABLET | Freq: Once | ORAL | Status: AC
  Administered 2014-12-10: 500 mg via ORAL

## 2014-12-10 MED ORDER — IOHEXOL 300 MG/ML  SOLN
100.0000 mL | Freq: Once | INTRAMUSCULAR | Status: AC | PRN
  Administered 2014-12-10: 100 mL via INTRAVENOUS

## 2014-12-10 MED ORDER — CIPROFLOXACIN HCL 500 MG PO TABS
500.0000 mg | ORAL_TABLET | Freq: Once | ORAL | Status: AC
  Administered 2014-12-10: 500 mg via ORAL

## 2014-12-10 MED ORDER — METRONIDAZOLE 500 MG PO TABS
ORAL_TABLET | ORAL | Status: AC
  Administered 2014-12-10: 500 mg via ORAL
  Filled 2014-12-10: qty 1

## 2014-12-10 MED ORDER — ONDANSETRON HCL 4 MG/2ML IJ SOLN
INTRAMUSCULAR | Status: AC
  Administered 2014-12-10: 4 mg via INTRAVENOUS
  Filled 2014-12-10: qty 2

## 2014-12-10 MED ORDER — METRONIDAZOLE 500 MG PO TABS: 500.0000 mg | ORAL_TABLET | Freq: Three times a day (TID) | ORAL | Status: DC

## 2014-12-10 MED ORDER — TRAMADOL HCL 50 MG PO TABS: 50.0000 mg | ORAL_TABLET | Freq: Four times a day (QID) | ORAL | Status: DC | PRN

## 2014-12-10 NOTE — ED Notes (Signed)
Patient transported to CT 

## 2014-12-10 NOTE — ED Notes (Signed)
Pt to triage via w/c with no distress noted; st brought over from [facility] urgent care; c/o left lower abd pain since Sunday with knot noted to umbilical area since January; denies accomp symptoms but st pain does increase with eating

## 2014-12-10 NOTE — ED Provider Notes (Signed)
[facility] Emergency Department Provider Note  Time seen: 8:33 PM  I have reviewed the triage vital signs and the nursing notes.   HISTORY  Chief Complaint Abdominal Pain    HPI Laura Gonzalez is a 74 y.o. female with a past medical history of asthma, arthritis, umbilical hernia status post mesh procedure, hypertension, hyperlipidemia who presents the emergency department with 2 days of left lower quadrant pain. According to the patient since Sunday she has had progressively worsening dull aching pain to her left lower quadrant. She states currently the pain is moderate in severity. She denies nausea, vomiting, diarrhea, dysuria. She does state it hurts somewhat worse while she eats, but nothing in the upper abdomen only to left lower quadrant. Patient has a history of a hernia at the left lower quadrant which was repaired with mesh several years ago. Patient also notes a recent hematoma due to Lovenox injections in the left lower quadrant approximately 3 months ago. Patient takes Coumadin for atrial fibrillation.     Past Medical History  Diagnosis Date  . Dysrhythmia     afib  . Pneumonia   . Asthma   . Numbness and tingling     arm to leg  . Arthritis     wrist and knees  . Hernia, umbilical   . Shortness of breath     only with astma attacks  . Sleep apnea   . Cancer     skin cancer  . Osteoporosis   . Hypertension   . GERD (gastroesophageal reflux disease)     There are no active problems to display for this patient.   Past Surgical History  Procedure Laterality Date  . Tonsillectomy  1960  . Umbilical hernia repair  [id number]  . Wisdom tooth extraction  1963  . Wrist surgery Right 1998    external fixator  . Colonscopy  2000,2007,2012  . Rotator cuff repair Bilateral right- 2009, left 2011  . Skin cancer excision  2009    back of neck and right cheek  . Lipoma excision Right 2010    back  . Varicose vein surgery Left 04/2009 rt  2011  . Anterior cervical decomp/discectomy fusion N/A 08/09/2012    Procedure: ANTERIOR CERVICAL DECOMPRESSION/DISCECTOMY FUSION 2 LEVELS;  Surgeon: [name], MD;  Location: [place] [facility];  Service: Neurosurgery;  Laterality: N/A;  C3-4 C4-5 Anterior cervical decompression/diskectomy/fusion/LifeNet Bone/Trestle plate    Current Outpatient Rx  Name  Route  Sig  Dispense  Refill  . albuterol (PROVENTIL HFA;VENTOLIN HFA) 108 (90 BASE) MCG/ACT inhaler   Inhalation   Inhale 2 puffs into the lungs every 6 (six) hours as needed. For shortness of breath         . albuterol (PROVENTIL) (2.5 MG/3ML) 0.083% nebulizer solution   Nebulization   Take 2.5 mg by nebulization every 6 (six) hours as needed. For shortness of breath         . diltiazem (CARDIZEM CD) 300 MG 24 hr capsule   Oral   Take 300 mg by mouth daily.         Marland Kitchen diltiazem (CARDIZEM) 60 MG tablet   Oral   Take 120 mg by mouth every 6 (six) hours as needed. Takes if has palpitation after taking diltiazem 300mg  dose         . ezetimibe (ZETIA) 10 MG tablet   Oral   Take 10 mg by mouth at bedtime.         Marland Kitchen  Fluticasone-Salmeterol (ADVAIR) 100-50 MCG/DOSE AEPB   Inhalation   Inhale 1 puff into the lungs every 12 (twelve) hours.         . hydrochlorothiazide (HYDRODIURIL) 25 MG tablet   Oral   Take 25 mg by mouth daily.         Marland Kitchen loratadine (CLARITIN) 10 MG tablet   Oral   Take 10 mg by mouth daily.         . magnesium oxide (MAG-OX) 400 MG tablet   Oral   Take 400 mg by mouth daily.         . Misc Natural Products (OSTEO BI-FLEX JOINT SHIELD PO)   Oral   Take 1 tablet by mouth daily.         . mometasone (NASONEX) 50 MCG/ACT nasal spray   Nasal   Place 1 spray into the nose daily.         . montelukast (SINGULAIR) 10 MG tablet   Oral   Take 10 mg by mouth at bedtime.         . Multiple Vitamin (MULTIVITAMIN WITH MINERALS) TABS   Oral   Take 1 tablet by mouth daily.         .  Multiple Vitamins-Minerals (PRESERVISION AREDS PO)   Oral   Take 1 tablet by mouth daily.         . Omega-3 Fatty Acids (FISH OIL) 1200 MG CAPS   Oral   Take 1,200 mg by mouth daily.         Marland Kitchen OVER THE COUNTER MEDICATION   Oral   Take 3,000 Units by mouth daily. Vitamin D3         . potassium chloride SA (K-DUR,KLOR-CON) 20 MEQ tablet   Oral   Take 20 mEq by mouth 3 (three) times daily.         . theophylline (THEODUR) 300 MG 12 hr tablet   Oral   Take 300 mg by mouth 2 (two) times daily.         . vitamin B-12 (CYANOCOBALAMIN) 1000 MCG tablet   Oral   Take 1,000 mcg by mouth daily.         Marland Kitchen warfarin (COUMADIN) 6 MG tablet   Oral   Take 4 mg by mouth daily at 6 PM.          . Calcium Citrate (CITRACAL PO)   Oral   Take 1 tablet by mouth 2 (two) times daily.         . cyclobenzaprine (FLEXERIL) 10 MG tablet   Oral   Take 1 tablet (10 mg total) by mouth 3 (three) times daily as needed for muscle spasms.   40 tablet   1   . traMADol-acetaminophen (ULTRACET) 37.5-325 MG per tablet   Oral   Take 1-2 tablets by mouth every 6 (six) hours as needed for pain.   41 tablet   0     Allergies Amoxicillin; Chloraseptic sore throat; Neosporin; Other; Oxycodone; Statins; and Tape  No family history on file.  Social History History  Substance Use Topics  . Smoking status: Never Smoker   . Smokeless tobacco: Not on file  . Alcohol Use: No    Review of Systems Constitutional: Negative for fever. Cardiovascular: Negative for chest pain. Respiratory: Negative for shortness of breath. Gastrointestinal: Positive for left lower quadrant abdominal pain. Musculoskeletal: Negative for back pain. Neurological: Negative for headache 10-point ROS otherwise negative.  ____________________________________________   PHYSICAL EXAM:  VITAL  SIGNS: ED Triage Vitals  Enc Vitals Group     BP 12/10/14 1928 127/59 mmHg     Pulse Rate 12/10/14 1928 75     Resp  12/10/14 1928 18     Temp 12/10/14 1928 97.7 F (36.5 C)     Temp Source 12/10/14 1928 Oral     SpO2 12/10/14 1928 98 %     Weight 12/10/14 1928 240 lb (108.863 kg)     Height 12/10/14 1928 5\' 5"  (1.651 m)     Head Cir --      Peak Flow --      Pain Score 12/10/14 1931 3     Pain Loc --      Pain Edu? --      Excl. in [place]? --     Constitutional: Alert and oriented. Well appearing and in no distress. Eyes: Normal exam ENT   Mouth/Throat: Mucous membranes are moist. Cardiovascular: Normal rate, regular rhythm. No murmur Respiratory: Normal respiratory effort without tachypnea nor retractions. Breath sounds are clear  Gastrointestinal: Soft, moderate suprapubic and left lower quadrant tenderness palpation. No rebound or guarding. No distention. Musculoskeletal: Nontender with normal range of motion in all extremities.  Neurologic:  Normal speech and language. No gross focal neurologic deficits Psychiatric: Mood and affect are normal. Speech and behavior are normal.  ____________________________________________     INITIAL IMPRESSION / ASSESSMENT AND PLAN / ED COURSE  Pertinent labs & imaging results that were available during my care of the patient were reviewed by me and considered in my medical decision making (see chart for details).  Patient left lower quadrant suprapubic abdominal pain. Labs are resulted showing largely normal results besides a mild urinary tract infection on urinalysis. The urinary tract infection could possibly be the cause of the patient's discomfort, however due to the moderate tenderness to palpation we will proceed with a CT scan to further evaluate and rule out other intra-abdominal pathology such as colitis or diverticulitis. Patient agreeable to plan. We will treat with fentanyl and Zofran, and monitor closely in the emergency department.  CT consistent with diverticulitis. We'll place the patient on antibiotics and discharge home with primary care  follow-up. The patient agreeable to plan.  ____________________________________________   FINAL CLINICAL IMPRESSION(S) / ED DIAGNOSES  Left lower quadrant abdominal pain Acute sigmoid diverticulitis   Harvest Dark, MD 12/10/14 2201

## 2014-12-10 NOTE — ED Notes (Signed)
MD at bedside. 

## 2014-12-10 NOTE — ED Notes (Signed)
Pt presents to ED with c/o left lower abdominal pain x2 days. Pt denies any N/V/D, but does reports the pain increases with eating.

## 2014-12-10 NOTE — Discharge Instructions (Signed)
Diverticulitis °Diverticulitis is when small pockets that have formed in your colon (large intestine) become infected or swollen. °HOME CARE °· Follow your doctor's instructions. °· Follow a special diet if told by your doctor. °· When you feel better, your doctor may tell you to change your diet. You may be told to eat a lot of fiber. Fruits and vegetables are good sources of fiber. Fiber makes it easier to poop (have bowel movements). °· Take supplements or probiotics as told by your doctor. °· Only take medicines as told by your doctor. °· Keep all follow-up visits with your doctor. °GET HELP IF: °· Your pain does not get better. °· You have a hard time eating food. °· You are not pooping like normal. °GET HELP RIGHT AWAY IF: °· Your pain gets worse. °· Your problems do not get better. °· Your problems suddenly get worse. °· You have a fever. °· You keep throwing up (vomiting). °· You have bloody or black, tarry poop (stool). °MAKE SURE YOU:  °· Understand these instructions. °· Will watch your condition. °· Will get help right away if you are not doing well or get worse. °Document Released: 11/25/2007 Document Revised: 06/13/2013 Document Reviewed: 05/03/2013 °[employer]® Patient Information ©2015 [employer]. This information is not intended to replace advice given to you by your health care provider. Make sure you discuss any questions you have with your health care provider. ° °

## 2014-12-11 ENCOUNTER — Emergency Department: Payer: Medicare Other

## 2014-12-11 ENCOUNTER — Inpatient Hospital Stay
Admission: EM | Admit: 2014-12-11 | Discharge: 2014-12-20 | DRG: 871 | Payer: Medicare Other | Attending: Internal Medicine | Admitting: Internal Medicine

## 2014-12-11 DIAGNOSIS — A419 Sepsis, unspecified organism: Secondary | ICD-10-CM | POA: Diagnosis present

## 2014-12-11 DIAGNOSIS — J90 Pleural effusion, not elsewhere classified: Secondary | ICD-10-CM

## 2014-12-11 DIAGNOSIS — J45909 Unspecified asthma, uncomplicated: Secondary | ICD-10-CM | POA: Diagnosis present

## 2014-12-11 DIAGNOSIS — I4891 Unspecified atrial fibrillation: Secondary | ICD-10-CM | POA: Diagnosis present

## 2014-12-11 DIAGNOSIS — R6510 Systemic inflammatory response syndrome (SIRS) of non-infectious origin without acute organ dysfunction: Secondary | ICD-10-CM

## 2014-12-11 DIAGNOSIS — Z7901 Long term (current) use of anticoagulants: Secondary | ICD-10-CM | POA: Diagnosis not present

## 2014-12-11 DIAGNOSIS — I480 Paroxysmal atrial fibrillation: Secondary | ICD-10-CM | POA: Diagnosis present

## 2014-12-11 DIAGNOSIS — K5792 Diverticulitis of intestine, part unspecified, without perforation or abscess without bleeding: Secondary | ICD-10-CM | POA: Diagnosis present

## 2014-12-11 DIAGNOSIS — Z888 Allergy status to other drugs, medicaments and biological substances status: Secondary | ICD-10-CM | POA: Diagnosis not present

## 2014-12-11 DIAGNOSIS — Y95 Nosocomial condition: Secondary | ICD-10-CM | POA: Diagnosis present

## 2014-12-11 DIAGNOSIS — K219 Gastro-esophageal reflux disease without esophagitis: Secondary | ICD-10-CM | POA: Diagnosis present

## 2014-12-11 DIAGNOSIS — Z808 Family history of malignant neoplasm of other organs or systems: Secondary | ICD-10-CM | POA: Diagnosis not present

## 2014-12-11 DIAGNOSIS — M1990 Unspecified osteoarthritis, unspecified site: Secondary | ICD-10-CM | POA: Diagnosis present

## 2014-12-11 DIAGNOSIS — Z79899 Other long term (current) drug therapy: Secondary | ICD-10-CM | POA: Diagnosis not present

## 2014-12-11 DIAGNOSIS — K5732 Diverticulitis of large intestine without perforation or abscess without bleeding: Secondary | ICD-10-CM | POA: Diagnosis present

## 2014-12-11 DIAGNOSIS — Z8249 Family history of ischemic heart disease and other diseases of the circulatory system: Secondary | ICD-10-CM | POA: Diagnosis not present

## 2014-12-11 DIAGNOSIS — Z86010 Personal history of colonic polyps: Secondary | ICD-10-CM | POA: Diagnosis not present

## 2014-12-11 DIAGNOSIS — Z880 Allergy status to penicillin: Secondary | ICD-10-CM | POA: Diagnosis not present

## 2014-12-11 DIAGNOSIS — M810 Age-related osteoporosis without current pathological fracture: Secondary | ICD-10-CM | POA: Diagnosis present

## 2014-12-11 DIAGNOSIS — Z803 Family history of malignant neoplasm of breast: Secondary | ICD-10-CM | POA: Diagnosis not present

## 2014-12-11 DIAGNOSIS — R0602 Shortness of breath: Secondary | ICD-10-CM

## 2014-12-11 DIAGNOSIS — J189 Pneumonia, unspecified organism: Secondary | ICD-10-CM | POA: Diagnosis not present

## 2014-12-11 DIAGNOSIS — Z7951 Long term (current) use of inhaled steroids: Secondary | ICD-10-CM | POA: Diagnosis not present

## 2014-12-11 DIAGNOSIS — K648 Other hemorrhoids: Secondary | ICD-10-CM | POA: Diagnosis present

## 2014-12-11 DIAGNOSIS — Z9889 Other specified postprocedural states: Secondary | ICD-10-CM | POA: Diagnosis not present

## 2014-12-11 DIAGNOSIS — J9601 Acute respiratory failure with hypoxia: Secondary | ICD-10-CM | POA: Diagnosis present

## 2014-12-11 DIAGNOSIS — I481 Persistent atrial fibrillation: Secondary | ICD-10-CM | POA: Diagnosis not present

## 2014-12-11 DIAGNOSIS — Z800 Family history of malignant neoplasm of digestive organs: Secondary | ICD-10-CM | POA: Diagnosis not present

## 2014-12-11 DIAGNOSIS — K5900 Constipation, unspecified: Secondary | ICD-10-CM | POA: Diagnosis present

## 2014-12-11 DIAGNOSIS — Z6838 Body mass index (BMI) 38.0-38.9, adult: Secondary | ICD-10-CM | POA: Diagnosis not present

## 2014-12-11 DIAGNOSIS — Z85828 Personal history of other malignant neoplasm of skin: Secondary | ICD-10-CM | POA: Diagnosis not present

## 2014-12-11 DIAGNOSIS — I10 Essential (primary) hypertension: Secondary | ICD-10-CM | POA: Diagnosis present

## 2014-12-11 DIAGNOSIS — I482 Chronic atrial fibrillation: Secondary | ICD-10-CM | POA: Diagnosis present

## 2014-12-11 DIAGNOSIS — I4819 Other persistent atrial fibrillation: Secondary | ICD-10-CM | POA: Diagnosis present

## 2014-12-11 DIAGNOSIS — G4730 Sleep apnea, unspecified: Secondary | ICD-10-CM | POA: Diagnosis present

## 2014-12-11 DIAGNOSIS — Z8261 Family history of arthritis: Secondary | ICD-10-CM | POA: Diagnosis not present

## 2014-12-11 HISTORY — DX: Diverticulosis of intestine, part unspecified, without perforation or abscess without bleeding: K57.90

## 2014-12-11 MED ORDER — METRONIDAZOLE IN NACL 5-0.79 MG/ML-% IV SOLN
500.0000 mg | Freq: Once | INTRAVENOUS | Status: AC
  Administered 2014-12-12: 03:00:00 500 mg via INTRAVENOUS

## 2014-12-11 MED ORDER — ONDANSETRON 4 MG PO TBDP
ORAL_TABLET | ORAL | Status: AC
  Administered 2014-12-11: 4 mg via ORAL
  Filled 2014-12-11: qty 1

## 2014-12-11 MED ORDER — ONDANSETRON 4 MG PO TBDP
4.0000 mg | ORAL_TABLET | Freq: Once | ORAL | Status: AC
  Administered 2014-12-11: 4 mg via ORAL

## 2014-12-11 MED ORDER — ACETAMINOPHEN 325 MG PO TABS
ORAL_TABLET | ORAL | Status: AC
  Administered 2014-12-11: 650 mg via ORAL
  Filled 2014-12-11: qty 2

## 2014-12-11 MED ORDER — ACETAMINOPHEN 325 MG PO TABS
650.0000 mg | ORAL_TABLET | Freq: Once | ORAL | Status: AC
  Administered 2014-12-11: 650 mg via ORAL

## 2014-12-11 MED ORDER — CIPROFLOXACIN IN D5W 400 MG/200ML IV SOLN
400.0000 mg | Freq: Once | INTRAVENOUS | Status: AC
  Administered 2014-12-12: 400 mg via INTRAVENOUS

## 2014-12-11 NOTE — ED Provider Notes (Signed)
[facility] Emergency Department Provider Note   ____________________________________________  Time seen: 2140  I have reviewed the triage vital signs and the nursing notes.   HISTORY  Chief Complaint Fever and Nausea   History limited by: Not Limited   HPI Laura Gonzalez is a 74 y.o. female who presents to the emergency department today with continued abdominal pain, new fevers and vomiting. Patient was seen in the emergency department yesterday and diagnosed with diverticulitis. She states she was sent home with antibiotics. She has been having a hard time keeping her antibiotics down because of vomiting. Additionally the patient noticed she had fevers throughout the day today. Temperature max was 101.3. She did not try taking any Tylenol or ibuprofen. She states her abdominal pain has been fairly constant since yesterday.     Past Medical History  Diagnosis Date  . Dysrhythmia     afib  . Pneumonia   . Asthma   . Numbness and tingling     arm to leg  . Arthritis     wrist and knees  . Hernia, umbilical   . Shortness of breath     only with astma attacks  . Sleep apnea   . Cancer     skin cancer  . Osteoporosis   . Hypertension   . GERD (gastroesophageal reflux disease)     There are no active problems to display for this patient.   Past Surgical History  Procedure Laterality Date  . Tonsillectomy  1960  . Umbilical hernia repair  [id number]  . Wisdom tooth extraction  1963  . Wrist surgery Right 1998    external fixator  . Colonscopy  2000,2007,2012  . Rotator cuff repair Bilateral right- 2009, left 2011  . Skin cancer excision  2009    back of neck and right cheek  . Lipoma excision Right 2010    back  . Varicose vein surgery Left 04/2009 rt 2011  . Anterior cervical decomp/discectomy fusion N/A 08/09/2012    Procedure: ANTERIOR CERVICAL DECOMPRESSION/DISCECTOMY FUSION 2 LEVELS;  Surgeon: [name], MD;  Location: [place] [facility];  Service: Neurosurgery;  Laterality: N/A;  C3-4 C4-5 Anterior cervical decompression/diskectomy/fusion/LifeNet Bone/Trestle plate    Current Outpatient Rx  Name  Route  Sig  Dispense  Refill  . albuterol (PROVENTIL HFA;VENTOLIN HFA) 108 (90 BASE) MCG/ACT inhaler   Inhalation   Inhale 2 puffs into the lungs every 6 (six) hours as needed. For shortness of breath         . albuterol (PROVENTIL) (2.5 MG/3ML) 0.083% nebulizer solution   Nebulization   Take 2.5 mg by nebulization every 6 (six) hours as needed. For shortness of breath         . Calcium Citrate (CITRACAL PO)   Oral   Take 1 tablet by mouth 2 (two) times daily.         . ciprofloxacin (CIPRO) 500 MG tablet   Oral   Take 1 tablet (500 mg total) by mouth 2 (two) times daily.   28 tablet   0   . cyclobenzaprine (FLEXERIL) 10 MG tablet   Oral   Take 1 tablet (10 mg total) by mouth 3 (three) times daily as needed for muscle spasms.   40 tablet   1   . diltiazem (CARDIZEM CD) 300 MG 24 hr capsule   Oral   Take 300 mg by mouth daily.         Marland Kitchen diltiazem (CARDIZEM) 60 MG  tablet   Oral   Take 120 mg by mouth every 6 (six) hours as needed. Takes if has palpitation after taking diltiazem 300mg  dose         . ezetimibe (ZETIA) 10 MG tablet   Oral   Take 10 mg by mouth at bedtime.         . Fluticasone-Salmeterol (ADVAIR) 100-50 MCG/DOSE AEPB   Inhalation   Inhale 1 puff into the lungs every 12 (twelve) hours.         . hydrochlorothiazide (HYDRODIURIL) 25 MG tablet   Oral   Take 25 mg by mouth daily.         Marland Kitchen loratadine (CLARITIN) 10 MG tablet   Oral   Take 10 mg by mouth daily.         . magnesium oxide (MAG-OX) 400 MG tablet   Oral   Take 400 mg by mouth daily.         . metroNIDAZOLE (FLAGYL) 500 MG tablet   Oral   Take 1 tablet (500 mg total) by mouth 3 (three) times daily.   42 tablet   0   . Misc Natural Products (OSTEO BI-FLEX JOINT SHIELD PO)   Oral   Take 1 tablet by  mouth daily.         . mometasone (NASONEX) 50 MCG/ACT nasal spray   Nasal   Place 1 spray into the nose daily.         . montelukast (SINGULAIR) 10 MG tablet   Oral   Take 10 mg by mouth at bedtime.         . Multiple Vitamin (MULTIVITAMIN WITH MINERALS) TABS   Oral   Take 1 tablet by mouth daily.         . Multiple Vitamins-Minerals (PRESERVISION AREDS PO)   Oral   Take 1 tablet by mouth daily.         . Omega-3 Fatty Acids (FISH OIL) 1200 MG CAPS   Oral   Take 1,200 mg by mouth daily.         Marland Kitchen OVER THE COUNTER MEDICATION   Oral   Take 3,000 Units by mouth daily. Vitamin D3         . potassium chloride SA (K-DUR,KLOR-CON) 20 MEQ tablet   Oral   Take 20 mEq by mouth 3 (three) times daily.         . theophylline (THEODUR) 300 MG 12 hr tablet   Oral   Take 300 mg by mouth 2 (two) times daily.         . traMADol (ULTRAM) 50 MG tablet   Oral   Take 1 tablet (50 mg total) by mouth every 6 (six) hours as needed.   20 tablet   0   . traMADol-acetaminophen (ULTRACET) 37.5-325 MG per tablet   Oral   Take 1-2 tablets by mouth every 6 (six) hours as needed for pain.   41 tablet   0   . vitamin B-12 (CYANOCOBALAMIN) 1000 MCG tablet   Oral   Take 1,000 mcg by mouth daily.         Marland Kitchen warfarin (COUMADIN) 6 MG tablet   Oral   Take 4 mg by mouth daily at 6 PM.            Allergies Amoxicillin; Chloraseptic sore throat; Neosporin; Other; Oxycodone; Statins; and Tape  No family history on file.  Social History History  Substance Use Topics  . Smoking status: Never Smoker   .  Smokeless tobacco: Not on file  . Alcohol Use: No    Review of Systems  Constitutional: positive for fever. Cardiovascular: Negative for chest pain. Respiratory: Negative for shortness of breath. Gastrointestinal: positive for abdominal pain, vomiting Genitourinary: Negative for dysuria. Musculoskeletal: Negative for back pain. Skin: Negative for  rash. Neurological: Negative for headaches, focal weakness or numbness.   10-point ROS otherwise negative.  ____________________________________________   PHYSICAL EXAM:  VITAL SIGNS: ED Triage Vitals  Enc Vitals Group     BP 12/11/14 2115 100/69 mmHg     Pulse Rate 12/11/14 2115 115     Resp --      Temp 12/11/14 2115 99.5 F (37.5 C)     Temp Source 12/11/14 2115 Oral     SpO2 12/11/14 2115 94 %     Weight 12/11/14 2115 240 lb (108.863 kg)     Height 12/11/14 2115 5\' 5"  (1.651 m)     Head Cir --      Peak Flow --      Pain Score 12/11/14 2120 3   Constitutional: Alert and oriented. Well appearing and in no distress. Eyes: Conjunctivae are normal. PERRL. Normal extraocular movements. ENT   Head: Normocephalic and atraumatic.   Nose: No congestion/rhinnorhea.   Mouth/Throat: Mucous membranes are moist.   Neck: No stridor. Hematological/Lymphatic/Immunilogical: No cervical lymphadenopathy. Cardiovascular: Normal rate, regular rhythm.  No murmurs, rubs, or gallops. Respiratory: Normal respiratory effort without tachypnea nor retractions. Breath sounds are clear and equal bilaterally. No wheezes/rales/rhonchi. Gastrointestinal: Soft and tender to the palpation in the left abdomen. No rebound. No guarding. Genitourinary: Deferred Musculoskeletal: Normal range of motion in all extremities. No joint effusions.  No lower extremity tenderness nor edema. Neurologic:  Normal speech and language. No gross focal neurologic deficits are appreciated. Speech is normal.  Skin:  Skin is warm, dry and intact. No rash noted. Psychiatric: Mood and affect are normal. Speech and behavior are normal. Patient exhibits appropriate insight and judgment.  ____________________________________________    LABS (pertinent positives/negatives)  pending at time of admission  ____________________________________________   EKG  None  ____________________________________________     RADIOLOGY  Abd x-rays IMPRESSION: 1. Colon partially filled with contrast, with scattered underlying diverticulosis. Unremarkable bowel gas pattern; no free intra-abdominal air seen. 2. Peribronchial thickening noted. Minimal left basilar atelectasis seen.  ____________________________________________   PROCEDURES  Procedure(s) performed: None  Critical Care performed: No  ____________________________________________   INITIAL IMPRESSION / ASSESSMENT AND PLAN / ED COURSE  Pertinent labs & imaging results that were available during my care of the patient were reviewed by me and considered in my medical decision making (see chart for details).  Patient presents to the emergency department today with continued abdominal pain, new fever and vomiting. Patient diagnosed diverticulitis yesterday. On exam patient with some abdominal tenderness. Tachycardic, febrile. Given fever, tachycardic and concern for worsening diverticulitis. Patient has not been able to tolerate outpatient antibiotics. I obtained a three-view abdomen to make sure no free air. Will plan on admission.  ____________________________________________   FINAL CLINICAL IMPRESSION(S) / ED DIAGNOSES  Final diagnoses:  Diverticulitis of large intestine without perforation or abscess without bleeding  SIRS (systemic inflammatory response syndrome)     Nance Pear, MD 12/11/14 2346

## 2014-12-11 NOTE — H&P (Signed)
[facility] NAME: Laura Gonzalez    MR#:  [id number]  DATE OF BIRTH:  December 26, 1940  DATE OF ADMISSION:  12/11/2014  PRIMARY CARE PHYSICIAN: [name], MD   REQUESTING/REFERRING PHYSICIAN: Archie Balboa  CHIEF COMPLAINT:   Chief Complaint  Patient presents with  . Fever  . Nausea    HISTORY OF PRESENT ILLNESS:  Laura Gonzalez  is a 74 y.o. female who presents with diverticulitis. Patient came to the ED yesterday with several days complaint of left lower quadrant pain and was found on imaging to have diverticulitis. She was sent home with oral antibiotics. She returned today after spiking several fevers, having shaking chills, and nausea and vomiting to the point that she was not able to keep down her by mouth medications. She was tachycardic on arrival here. Hospitalists were called for admission for sepsis secondary to her diverticulitis, and for diverticulitis having failed outpatient therapy.  PAST MEDICAL HISTORY:   Past Medical History  Diagnosis Date  . Dysrhythmia     afib  . Pneumonia   . Asthma   . Numbness and tingling     arm to leg  . Arthritis     wrist and knees  . Hernia, umbilical   . Shortness of breath     only with astma attacks  . Sleep apnea   . Cancer     skin cancer  . Osteoporosis   . Hypertension   . GERD (gastroesophageal reflux disease)   . Diverticulosis     PAST SURGICAL HISTORY:   Past Surgical History  Procedure Laterality Date  . Tonsillectomy  1960  . Umbilical hernia repair  [id number]  . Wisdom tooth extraction  1963  . Wrist surgery Right 1998    external fixator  . Colonscopy  2000,2007,2012  . Rotator cuff repair Bilateral right- 2009, left 2011  . Skin cancer excision  2009    back of neck and right cheek  . Lipoma excision Right 2010    back  . Varicose vein surgery Left 04/2009 rt 2011  . Anterior cervical decomp/discectomy fusion N/A 08/09/2012    Procedure:  ANTERIOR CERVICAL DECOMPRESSION/DISCECTOMY FUSION 2 LEVELS;  Surgeon: [name], MD;  Location: [facility];  Service: Neurosurgery;  Laterality: N/A;  C3-4 C4-5 Anterior cervical decompression/diskectomy/fusion/[employer] Bone/Trestle plate    SOCIAL HISTORY:   History  Substance Use Topics  . Smoking status: Never Smoker   . Smokeless tobacco: Not on file  . Alcohol Use: No    FAMILY HISTORY:   Family History  Problem Relation Age of Onset  . Breast cancer Mother   . Pulmonary embolism Mother   . Arthritis Mother   . Hypertension Mother   . Heart attack Mother   . Stomach cancer Maternal Aunt   . Throat cancer Maternal Uncle   . Stomach cancer Maternal Grandfather     DRUG ALLERGIES:   Allergies  Allergen Reactions  . Amoxicillin Other (See Comments)    Lips swelling, tingling  . Benzocaine-Menthol     Throat swelling  . Biafine [Wound Dressings] Swelling  . Chloraseptic Sore Throat [Acetaminophen] Other (See Comments)    throat swelling  . Fosamax [Alendronate Sodium]   . Neosporin [Neomycin-Bacitracin Zn-Polymyx] Other (See Comments)    Skin redness, puffy  . Other     "chlorotrimitron"-- rxn: throat swelling  . Oxycodone Other (See Comments)    Dizziness, lips tingling  . Statins  Other (See Comments)    Muscle weakness, weak  . Tape Other (See Comments)    Skin redness    MEDICATIONS AT HOME:   Prior to Admission medications   Medication Sig Start Date End Date Taking? Authorizing Provider  albuterol (PROVENTIL HFA;VENTOLIN HFA) 108 (90 BASE) MCG/ACT inhaler Inhale 2 puffs into the lungs every 6 (six) hours as needed. For shortness of breath    Historical Provider, MD  albuterol (PROVENTIL) (2.5 MG/3ML) 0.083% nebulizer solution Take 2.5 mg by nebulization every 6 (six) hours as needed. For shortness of breath    Historical Provider, MD  Calcium Citrate (CITRACAL PO) Take 1 tablet by mouth 2 (two) times daily.    Historical Provider, MD  ciprofloxacin  (CIPRO) 500 MG tablet Take 1 tablet (500 mg total) by mouth 2 (two) times daily. 12/10/14 12/20/14  Harvest Dark, MD  cyclobenzaprine (FLEXERIL) 10 MG tablet Take 1 tablet (10 mg total) by mouth 3 (three) times daily as needed for muscle spasms. 08/11/12   Hazle Coca, MD  diltiazem (CARDIZEM CD) 300 MG 24 hr capsule Take 300 mg by mouth daily.    Historical Provider, MD  diltiazem (CARDIZEM) 60 MG tablet Take 120 mg by mouth every 6 (six) hours as needed. Takes if has palpitation after taking diltiazem 300mg  dose    Historical Provider, MD  ezetimibe (ZETIA) 10 MG tablet Take 10 mg by mouth at bedtime.    Historical Provider, MD  Fluticasone-Salmeterol (ADVAIR) 100-50 MCG/DOSE AEPB Inhale 1 puff into the lungs every 12 (twelve) hours.    Historical Provider, MD  hydrochlorothiazide (HYDRODIURIL) 25 MG tablet Take 25 mg by mouth daily.    Historical Provider, MD  loratadine (CLARITIN) 10 MG tablet Take 10 mg by mouth daily.    Historical Provider, MD  magnesium oxide (MAG-OX) 400 MG tablet Take 400 mg by mouth daily.    Historical Provider, MD  metroNIDAZOLE (FLAGYL) 500 MG tablet Take 1 tablet (500 mg total) by mouth 3 (three) times daily. 12/10/14 12/24/14  Harvest Dark, MD  Misc Natural Products (OSTEO BI-FLEX JOINT SHIELD PO) Take 1 tablet by mouth daily.    Historical Provider, MD  mometasone (NASONEX) 50 MCG/ACT nasal spray Place 1 spray into the nose daily.    Historical Provider, MD  montelukast (SINGULAIR) 10 MG tablet Take 10 mg by mouth at bedtime.    Historical Provider, MD  Multiple Vitamin (MULTIVITAMIN WITH MINERALS) TABS Take 1 tablet by mouth daily.    Historical Provider, MD  Multiple Vitamins-Minerals (PRESERVISION AREDS PO) Take 1 tablet by mouth daily.    Historical Provider, MD  Omega-3 Fatty Acids (FISH OIL) 1200 MG CAPS Take 1,200 mg by mouth daily.    Historical Provider, MD  OVER THE COUNTER MEDICATION Take 3,000 Units by mouth daily. Vitamin D3    Historical Provider,  MD  potassium chloride SA (K-DUR,KLOR-CON) 20 MEQ tablet Take 20 mEq by mouth 3 (three) times daily.    Historical Provider, MD  theophylline (THEODUR) 300 MG 12 hr tablet Take 300 mg by mouth 2 (two) times daily.    Historical Provider, MD  traMADol (ULTRAM) 50 MG tablet Take 1 tablet (50 mg total) by mouth every 6 (six) hours as needed. 12/10/14 12/10/15  Harvest Dark, MD  traMADol-acetaminophen (ULTRACET) 37.5-325 MG per tablet Take 1-2 tablets by mouth every 6 (six) hours as needed for pain. 08/11/12   Hazle Coca, MD  vitamin B-12 (CYANOCOBALAMIN) 1000 MCG tablet Take 1,000 mcg by mouth daily.  Historical Provider, MD  warfarin (COUMADIN) 6 MG tablet Take 4 mg by mouth daily at 6 PM.     Historical Provider, MD    REVIEW OF SYSTEMS:  Review of Systems  Constitutional: Positive for fever, chills and malaise/fatigue. Negative for weight loss.  HENT: Negative for ear pain, hearing loss and tinnitus.   Eyes: Negative for blurred vision, double vision, pain and redness.  Respiratory: Negative for cough, hemoptysis and shortness of breath.   Cardiovascular: Negative for chest pain, palpitations, orthopnea and leg swelling.  Gastrointestinal: Positive for nausea, vomiting and abdominal pain. Negative for diarrhea, constipation and blood in stool.  Genitourinary: Negative for dysuria, frequency and hematuria.  Musculoskeletal: Negative for back pain, joint pain and neck pain.  Skin:       No acne, rash, or lesions  Neurological: Negative for dizziness, tremors, focal weakness and weakness.  Endo/Heme/Allergies: Negative for polydipsia. Does not bruise/bleed easily.  Psychiatric/Behavioral: Negative for depression. The patient is not nervous/anxious and does not have insomnia.      VITAL SIGNS:   Filed Vitals:   12/11/14 2115 12/11/14 2130  BP: 100/69 107/67  Pulse: 115 127  Temp: 99.5 F (37.5 C)   TempSrc: Oral   Height: 5\' 5"  (1.651 m)   Weight: 108.863 kg (240 lb)   SpO2:  94% 93%   Wt Readings from Last 3 Encounters:  12/11/14 108.863 kg (240 lb)  12/10/14 108.863 kg (240 lb)  08/09/12 104.5 kg (230 lb 6.1 oz)    PHYSICAL EXAMINATION:  Physical Exam  Constitutional: She is oriented to person, place, and time. She appears well-developed and well-nourished. No distress.  HENT:  Head: Normocephalic and atraumatic.  Mouth/Throat: Oropharynx is clear and moist.  Eyes: Conjunctivae and EOM are normal. Pupils are equal, round, and reactive to light. No scleral icterus.  Neck: Normal range of motion. Neck supple. No JVD present. No thyromegaly present.  Cardiovascular: Normal rate, regular rhythm and intact distal pulses.  Exam reveals no gallop and no friction rub.   No murmur heard. Respiratory: Effort normal and breath sounds normal. No respiratory distress. She has no wheezes. She has no rales.  GI: Soft. Bowel sounds are normal. She exhibits no distension. There is tenderness (tender to palpation in the left lower quadrant).  Musculoskeletal: Normal range of motion. She exhibits no edema.  No arthritis, no gout  Lymphadenopathy:    She has no cervical adenopathy.  Neurological: She is alert and oriented to person, place, and time. No cranial nerve deficit.  No dysarthria, no aphasia  Skin: Skin is warm and dry. No rash noted. No erythema.  Psychiatric: She has a normal mood and affect. Her behavior is normal. Judgment and thought content normal.    LABORATORY PANEL:   CBC  Recent Labs Lab 12/10/14 1942  WBC 10.5  HGB 15.3  HCT 46.3  PLT 227   ------------------------------------------------------------------------------------------------------------------  Chemistries   Recent Labs Lab 12/10/14 1942  NA 141  K 4.2  CL 100*  CO2 31  GLUCOSE 95  BUN 22*  CREATININE 0.77  CALCIUM 10.2  AST 20  ALT 15  ALKPHOS 89  BILITOT 0.5    ------------------------------------------------------------------------------------------------------------------  Cardiac Enzymes No results for input(s): TROPONINI in the last 168 hours. ------------------------------------------------------------------------------------------------------------------  RADIOLOGY:  Ct Abdomen Pelvis W Contrast  12/10/2014   CLINICAL DATA:  Left lower quadrant pain for 2 days.  EXAM: CT ABDOMEN AND PELVIS WITH CONTRAST  TECHNIQUE: Multidetector CT imaging of the abdomen and pelvis  was performed using the standard protocol following bolus administration of intravenous contrast.  CONTRAST:  188mL OMNIPAQUE IOHEXOL 300 MG/ML  SOLN  COMPARISON:  Right upper quadrant ultrasound 03/21/2014  FINDINGS: Lower chest: Linear areas of atelectasis or scarring in the lung bases. Heart is normal size. No effusions.  Hepatobiliary: Gallstones noted within the gallbladder. No focal hepatic abnormality.  Pancreas: No focal abnormality or ductal dilatation.  Spleen: No focal abnormality.  Normal size.  Adrenals/Urinary Tract: No focal renal or adrenal abnormality. No hydronephrosis. Urinary bladder is unremarkable.  Stomach/Bowel: Descending colonic and sigmoid diverticulosis. Inflammatory stranding around the proximal sigmoid colon compatible with active diverticulitis. No free air or extraluminal gas. No focal fluid collections. Stomach and small bowel are decompressed. Appendix is visualized and unremarkable.  Vascular/Lymphatic: No retroperitoneal or mesenteric adenopathy. Aorta normal caliber.  Reproductive: No mass or other significant abnormality.  Other: No free fluid or free air.  Musculoskeletal: No focal bone lesion or acute bony abnormality.  IMPRESSION: Colonic diverticulosis. Changes of active diverticulitis along the proximal sigmoid colon.  Cholelithiasis.   Electronically Signed   By: Rolm Baptise M.D.   On: 12/10/2014 21:23   Dg Abd Acute W/chest  12/11/2014   CLINICAL  DATA:  Acute onset of vomiting and fever. Initial encounter.  EXAM: DG ABDOMEN ACUTE W/ 1V CHEST  COMPARISON:  CT of the abdomen and pelvis performed 12/10/2014, and chest radiograph performed 10/10/2014  FINDINGS: The lungs are well-aerated. Peribronchial thickening is noted. Minimal left basilar atelectasis is seen. There is no evidence of pleural effusion or pneumothorax. The cardiomediastinal silhouette is within normal limits.  The visualized bowel gas pattern is unremarkable. Scattered contrast is seen within the colon; there is no evidence of small bowel dilatation to suggest obstruction. Scattered diverticulosis is noted along the ascending and proximal transverse colon. No free intra-abdominal air is identified on the provided upright view.  A pelvic mesh is noted.  No acute osseous abnormalities are seen; the sacroiliac joints are unremarkable in appearance. Cervical spinal fusion hardware is noted. The patient is status post right-sided rotator cuff repair.  IMPRESSION: 1. Colon partially filled with contrast, with scattered underlying diverticulosis. Unremarkable bowel gas pattern; no free intra-abdominal air seen. 2. Peribronchial thickening noted. Minimal left basilar atelectasis seen.   Electronically Signed   By: Garald Balding M.D.   On: 12/11/2014 22:45    EKG:   Orders placed or performed in visit on 06/13/14  . EKG 12-Lead  . EKG 12-Lead  . EKG 12-Lead    IMPRESSION AND PLAN:  Principal Problem:   Sepsis - hemodynamically stable, however she is febrile and tachycardic. This is most likely secondary to diverticulitis. We will have her on IV antibiotics, check lactic acid, UA and chest x-ray from less than 24 hours ago are negative. We will get blood cultures. Active Problems:   Diverticulitis - failed outpatient therapy, we will have her on antiemetics, and IV antibiotics.   A-fib - chronic, on chronic anticoagulation for this with warfarin. We will check an INR and adjust his  medication as appropriate. Continue home meds for rate control.   HTN (hypertension) - continue home meds   GERD (gastroesophageal reflux disease) - continue home dose PPI   Asthma - continue home inhalers   All the records are reviewed and case discussed with ED provider. Management plans discussed with the patient and/or family.  DVT PROPHYLAXIS: Systemic anticoagulation  ADMISSION STATUS: Inpatient  CODE STATUS: Full   TOTAL TIME TAKING CARE  OF THIS PATIENT:45 minutes.    Bleu Minerd Corcovado 12/11/2014, 11:45 PM  [facility]  Office  [phone]  CC: Primary care physician; [name], MD

## 2014-12-11 NOTE — ED Notes (Signed)
Patient to xray. Medicated for fever and nausea. Will await results and continue to monitor.

## 2014-12-11 NOTE — ED Notes (Signed)
Patient was here for same yesterday. States was told to return here if fever or N/V worsened. Patient states she vomited twice today and had a fever. Did not take any tylenol or motrin just what was prescribed for her.

## 2014-12-12 LAB — CBC
HEMATOCRIT: 44 % (ref 35.0–47.0)
Hemoglobin: 14.6 g/dL (ref 12.0–16.0)
MCH: 30.8 pg (ref 26.0–34.0)
MCHC: 33.3 g/dL (ref 32.0–36.0)
MCV: 92.6 fL (ref 80.0–100.0)
PLATELETS: 184 10*3/uL (ref 150–440)
RBC: 4.75 MIL/uL (ref 3.80–5.20)
RDW: 13.9 % (ref 11.5–14.5)
WBC: 10.8 10*3/uL (ref 3.6–11.0)

## 2014-12-12 LAB — BASIC METABOLIC PANEL
Anion gap: 8 (ref 5–15)
Anion gap: 7 (ref 5–15)
BUN: 17 mg/dL (ref 6–20)
BUN: 16 mg/dL (ref 6–20)
CALCIUM: 9.4 mg/dL (ref 8.9–10.3)
CO2: 27 mmol/L (ref 22–32)
CO2: 31 mmol/L (ref 22–32)
Calcium: 9.9 mg/dL (ref 8.9–10.3)
Chloride: 103 mmol/L (ref 101–111)
Chloride: 101 mmol/L (ref 101–111)
Creatinine, Ser: 0.94 mg/dL (ref 0.44–1.00)
Creatinine, Ser: 0.88 mg/dL (ref 0.44–1.00)
GFR calc Af Amer: >60 mL/min (ref >60)
GFR, EST NON AFRICAN AMERICAN: 59 mL/min — AB (ref >60)
GLUCOSE: 119 mg/dL — AB (ref 65–99)
Glucose, Bld: 133 mg/dL — ABNORMAL HIGH (ref 65–99)
Potassium: 3.9 mmol/L (ref 3.5–5.1)
Potassium: 3.7 mmol/L (ref 3.5–5.1)
SODIUM: 138 mmol/L (ref 135–145)
Sodium: 139 mmol/L (ref 135–145)

## 2014-12-12 LAB — CBC WITH DIFFERENTIAL/PLATELET
BASOS ABS: 0 10*3/uL (ref 0–0.1)
Basophils Relative: 0 %
EOS PCT: 0 %
Eosinophils Absolute: 0 10*3/uL (ref 0–0.7)
HCT: 42.9 % (ref 35.0–47.0)
HEMOGLOBIN: 14.2 g/dL (ref 12.0–16.0)
Lymphocytes Relative: 6 %
Lymphs Abs: 0.6 10*3/uL — ABNORMAL LOW (ref 1.0–3.6)
MCH: 30.2 pg (ref 26.0–34.0)
MCHC: 33.1 g/dL (ref 32.0–36.0)
MCV: 91.1 fL (ref 80.0–100.0)
MONO ABS: 0.5 10*3/uL (ref 0.2–0.9)
Monocytes Relative: 5 %
Neutro Abs: 8.9 10*3/uL — ABNORMAL HIGH (ref 1.4–6.5)
Neutrophils Relative %: 89 %
Platelets: 198 10*3/uL (ref 150–440)
RBC: 4.71 MIL/uL (ref 3.80–5.20)
RDW: 14.1 % (ref 11.5–14.5)
WBC: 10 10*3/uL (ref 3.6–11.0)

## 2014-12-12 LAB — PROTIME-INR
INR: 2.09
Prothrombin Time: 23.6 seconds — ABNORMAL HIGH (ref 11.4–15.0)

## 2014-12-12 LAB — LACTIC ACID, PLASMA: LACTIC ACID, VENOUS: 0.8 mmol/L (ref 0.5–2.0)

## 2014-12-12 MED ORDER — HYDROCHLOROTHIAZIDE 25 MG PO TABS
25.0000 mg | ORAL_TABLET | Freq: Every day | ORAL | Status: DC
  Administered 2014-12-12 – 2014-12-20 (×9): 25 mg via ORAL
  Filled 2014-12-12 (×9): qty 1

## 2014-12-12 MED ORDER — CIPROFLOXACIN IN D5W 400 MG/200ML IV SOLN
INTRAVENOUS | Status: AC
  Administered 2014-12-12: 400 mg via INTRAVENOUS
  Filled 2014-12-12: qty 200

## 2014-12-12 MED ORDER — WARFARIN SODIUM 4 MG PO TABS
4.0000 mg | ORAL_TABLET | Freq: Every day | ORAL | Status: DC
Start: 2014-12-12 — End: 2014-12-15
  Administered 2014-12-12 – 2014-12-14 (×3): 4 mg via ORAL
  Filled 2014-12-12 (×4): qty 1

## 2014-12-12 MED ORDER — ONDANSETRON HCL 4 MG/2ML IJ SOLN
4.0000 mg | Freq: Four times a day (QID) | INTRAMUSCULAR | Status: DC | PRN
  Administered 2014-12-14 – 2014-12-16 (×2): 4 mg via INTRAVENOUS
  Filled 2014-12-12 (×2): qty 2

## 2014-12-12 MED ORDER — ONDANSETRON HCL 4 MG PO TABS
4.0000 mg | ORAL_TABLET | Freq: Four times a day (QID) | ORAL | Status: DC | PRN
  Administered 2014-12-13: 03:00:00 4 mg via ORAL
  Filled 2014-12-12: qty 1

## 2014-12-12 MED ORDER — EZETIMIBE 10 MG PO TABS
10.0000 mg | ORAL_TABLET | Freq: Every day | ORAL | Status: DC
  Administered 2014-12-12 – 2014-12-14 (×3): 10 mg via ORAL
  Filled 2014-12-12 (×3): qty 1

## 2014-12-12 MED ORDER — MONTELUKAST SODIUM 10 MG PO TABS
10.0000 mg | ORAL_TABLET | Freq: Every day | ORAL | Status: DC
  Administered 2014-12-12 – 2014-12-19 (×8): 10 mg via ORAL
  Filled 2014-12-12 (×8): qty 1

## 2014-12-12 MED ORDER — METRONIDAZOLE IN NACL 5-0.79 MG/ML-% IV SOLN
500.0000 mg | Freq: Three times a day (TID) | INTRAVENOUS | Status: DC
  Administered 2014-12-12 – 2014-12-13 (×4): 500 mg via INTRAVENOUS
  Filled 2014-12-12 (×7): qty 100

## 2014-12-12 MED ORDER — ALBUTEROL SULFATE (2.5 MG/3ML) 0.083% IN NEBU
2.5000 mg | INHALATION_SOLUTION | Freq: Four times a day (QID) | RESPIRATORY_TRACT | Status: DC | PRN
  Administered 2014-12-14: 2.5 mg via RESPIRATORY_TRACT
  Filled 2014-12-12: qty 3

## 2014-12-12 MED ORDER — METRONIDAZOLE IN NACL 5-0.79 MG/ML-% IV SOLN
INTRAVENOUS | Status: AC
  Filled 2014-12-12: qty 100

## 2014-12-12 MED ORDER — MOMETASONE FURO-FORMOTEROL FUM 100-5 MCG/ACT IN AERO
2.0000 | INHALATION_SPRAY | Freq: Two times a day (BID) | RESPIRATORY_TRACT | Status: DC
  Administered 2014-12-12 – 2014-12-20 (×17): 2 via RESPIRATORY_TRACT
  Filled 2014-12-12: qty 8.8

## 2014-12-12 MED ORDER — CIPROFLOXACIN IN D5W 400 MG/200ML IV SOLN
400.0000 mg | Freq: Two times a day (BID) | INTRAVENOUS | Status: DC
  Administered 2014-12-12 – 2014-12-13 (×3): 400 mg via INTRAVENOUS
  Filled 2014-12-12 (×5): qty 200

## 2014-12-12 MED ORDER — HYDROCODONE-ACETAMINOPHEN 5-325 MG PO TABS: 1.0000 | ORAL_TABLET | ORAL | Status: DC | PRN

## 2014-12-12 MED ORDER — THEOPHYLLINE ER 300 MG PO TB12
300.0000 mg | ORAL_TABLET | Freq: Two times a day (BID) | ORAL | Status: DC
  Administered 2014-12-12 – 2014-12-15 (×8): 300 mg via ORAL
  Administered 2014-12-16: 150 mg via ORAL
  Administered 2014-12-16 – 2014-12-20 (×8): 300 mg via ORAL
  Filled 2014-12-12 (×7): qty 1
  Filled 2014-12-12: qty 2
  Filled 2014-12-12: qty 1
  Filled 2014-12-12 (×3): qty 2
  Filled 2014-12-12: qty 1
  Filled 2014-12-12: qty 2
  Filled 2014-12-12: qty 1
  Filled 2014-12-12: qty 2
  Filled 2014-12-12 (×3): qty 1
  Filled 2014-12-12 (×2): qty 2

## 2014-12-12 MED ORDER — SODIUM CHLORIDE 0.9 % IV SOLN
INTRAVENOUS | Status: DC
  Administered 2014-12-12: 03:00:00 via INTRAVENOUS

## 2014-12-12 MED ORDER — ACETAMINOPHEN 325 MG PO TABS
650.0000 mg | ORAL_TABLET | Freq: Four times a day (QID) | ORAL | Status: DC | PRN
Start: 2014-12-12 — End: 2014-12-20
  Administered 2014-12-12 – 2014-12-18 (×5): 650 mg via ORAL
  Filled 2014-12-12 (×5): qty 2

## 2014-12-12 MED ORDER — DILTIAZEM HCL ER COATED BEADS 180 MG PO CP24
300.0000 mg | ORAL_CAPSULE | Freq: Every day | ORAL | Status: DC
  Administered 2014-12-12 – 2014-12-16 (×5): 300 mg via ORAL
  Filled 2014-12-12 (×5): qty 1

## 2014-12-12 MED ORDER — SODIUM CHLORIDE 0.9 % IJ SOLN
3.0000 mL | Freq: Two times a day (BID) | INTRAMUSCULAR | Status: DC
  Administered 2014-12-12 – 2014-12-20 (×17): 3 mL via INTRAVENOUS

## 2014-12-12 MED ORDER — ACETAMINOPHEN 650 MG RE SUPP: 650.0000 mg | Freq: Four times a day (QID) | RECTAL | Status: DC | PRN

## 2014-12-12 MED ORDER — LORATADINE 10 MG PO TABS
10.0000 mg | ORAL_TABLET | Freq: Every day | ORAL | Status: DC
  Administered 2014-12-12 – 2014-12-20 (×9): 10 mg via ORAL
  Filled 2014-12-12 (×9): qty 1

## 2014-12-12 MED ORDER — WARFARIN - PHYSICIAN DOSING INPATIENT
Freq: Every day | Status: DC
  Administered 2014-12-12: 18:00:00

## 2014-12-12 MED ORDER — ALBUTEROL SULFATE HFA 108 (90 BASE) MCG/ACT IN AERS: 2.0000 | INHALATION_SPRAY | Freq: Four times a day (QID) | RESPIRATORY_TRACT | Status: DC | PRN

## 2014-12-12 NOTE — Consult Note (Signed)
Subjective: Patient seen for abdominal pain and diverticulitis. Patient seen and examined, chart reviewed. Please see GI consult by Ms. Richards.  Patient with symptoms of left lower quadrant pain nausea and fever helping over short periods time beginning about 3 days ago. He came to the emergency room initially and had oral anti-biotics prescribed for possible diverticulitis. Her symptoms worsened and she return for further evaluation. He is currently on IV anti-biotics Cipro and Flagyl her pain is about a 3-4 out of 10 and at worst it was 4-5. She currently continues with some fevers and her minimally.  Objective: Vital signs in last 24 hours: Temp:  [99 F (37.2 C)-101.3 F (38.5 C)] 99.1 F (37.3 C) (06/22 1500) Pulse Rate:  [71-127] 71 (06/22 1500) Resp:  [17-18] 18 (06/22 1500) BP: (100-142)/(39-69) 111/52 mmHg (06/22 1500) SpO2:  [93 %-98 %] 97 % (06/22 1500) Weight:  [104.101 kg (229 lb 8 oz)-108.863 kg (240 lb)] 104.101 kg (229 lb 8 oz) (06/22 0255) Blood pressure 111/52, pulse 71, temperature 99.1 F (37.3 C), temperature source Oral, resp. rate 18, height 5\' 5"  (1.651 m), weight 104.101 kg (229 lb 8 oz), SpO2 97 %.   Intake/Output from previous day:    Intake/Output this shift: Total I/O In: 903.8 [I.V.:603.8; IV Piggyback:300] Out: 300 [Urine:300]   General appearance:  74 year old Caucasian female no acute distress uncomfortable. Resp:  Bilaterally clear to auscultation Cardio:  Regular rate and rhythm without rub or gallop GI:  Soft mildly protuberant bowel sounds are positive she is tender to palpation from the left upper quadrant down to the left lower quadrant and towards the per pubic region. No masses or rebound. Extremities:  No clubbing cyanosis or edema   Lab Results: Results for orders placed or performed during the hospital encounter of 12/11/14 (from the past 24 hour(s))  Lactic acid, plasma     Status: None   Collection Time: 12/12/14 12:05 AM  Result  Value Ref Range   Lactic Acid, Venous 0.8 0.5 - 2.0 mmol/L  Basic metabolic panel     Status: Abnormal   Collection Time: 12/12/14 12:05 AM  Result Value Ref Range   Sodium 138 135 - 145 mmol/L   Potassium 3.9 3.5 - 5.1 mmol/L   Chloride 103 101 - 111 mmol/L   CO2 27 22 - 32 mmol/L   Glucose, Bld 133 (H) 65 - 99 mg/dL   BUN 17 6 - 20 mg/dL   Creatinine, Ser 0.94 0.44 - 1.00 mg/dL   Calcium 9.9 8.9 - 10.3 mg/dL   GFR calc non Af Amer 59 (L) >60 mL/min   GFR calc Af Amer >60 >60 mL/min   Anion gap 8 5 - 15  Protime-INR     Status: Abnormal   Collection Time: 12/12/14 12:05 AM  Result Value Ref Range   Prothrombin Time 23.6 (H) 11.4 - 15.0 seconds   INR 2.09   Culture, blood (routine x 2)     Status: None (Preliminary result)   Collection Time: 12/12/14  1:10 AM  Result Value Ref Range   Specimen Description BLOOD    Special Requests NONE    Culture NO GROWTH < 12 HOURS    Report Status PENDING   Culture, blood (routine x 2)     Status: None (Preliminary result)   Collection Time: 12/12/14  1:10 AM  Result Value Ref Range   Specimen Description BLOOD    Special Requests NONE    Culture NO GROWTH < 12 HOURS  Report Status PENDING   CBC with Differential     Status: Abnormal   Collection Time: 12/12/14  2:06 AM  Result Value Ref Range   WBC 10.0 3.6 - 11.0 K/uL   RBC 4.71 3.80 - 5.20 MIL/uL   Hemoglobin 14.2 12.0 - 16.0 g/dL   HCT 42.9 35.0 - 47.0 %   MCV 91.1 80.0 - 100.0 fL   MCH 30.2 26.0 - 34.0 pg   MCHC 33.1 32.0 - 36.0 g/dL   RDW 14.1 11.5 - 14.5 %   Platelets 198 150 - 440 K/uL   Neutrophils Relative % 89 %   Neutro Abs 8.9 (H) 1.4 - 6.5 K/uL   Lymphocytes Relative 6 %   Lymphs Abs 0.6 (L) 1.0 - 3.6 K/uL   Monocytes Relative 5 %   Monocytes Absolute 0.5 0.2 - 0.9 K/uL   Eosinophils Relative 0 %   Eosinophils Absolute 0.0 0 - 0.7 K/uL   Basophils Relative 0 %   Basophils Absolute 0.0 0 - 0.1 K/uL  Basic metabolic panel     Status: Abnormal   Collection  Time: 12/12/14  4:08 AM  Result Value Ref Range   Sodium 139 135 - 145 mmol/L   Potassium 3.7 3.5 - 5.1 mmol/L   Chloride 101 101 - 111 mmol/L   CO2 31 22 - 32 mmol/L   Glucose, Bld 119 (H) 65 - 99 mg/dL   BUN 16 6 - 20 mg/dL   Creatinine, Ser 0.88 0.44 - 1.00 mg/dL   Calcium 9.4 8.9 - 10.3 mg/dL   GFR calc non Af Amer >60 >60 mL/min   GFR calc Af Amer >60 >60 mL/min   Anion gap 7 5 - 15  CBC     Status: None   Collection Time: 12/12/14  4:08 AM  Result Value Ref Range   WBC 10.8 3.6 - 11.0 K/uL   RBC 4.75 3.80 - 5.20 MIL/uL   Hemoglobin 14.6 12.0 - 16.0 g/dL   HCT 44.0 35.0 - 47.0 %   MCV 92.6 80.0 - 100.0 fL   MCH 30.8 26.0 - 34.0 pg   MCHC 33.3 32.0 - 36.0 g/dL   RDW 13.9 11.5 - 14.5 %   Platelets 184 150 - 440 K/uL      Recent Labs  12/10/14 1942 12/12/14 0206 12/12/14 0408  WBC 10.5 10.0 10.8  HGB 15.3 14.2 14.6  HCT 46.3 42.9 44.0  PLT 227 198 184   BMET  Recent Labs  12/10/14 1942 12/12/14 0005 12/12/14 0408  NA 141 138 139  K 4.2 3.9 3.7  CL 100* 103 101  CO2 31 27 31   GLUCOSE 95 133* 119*  BUN 22* 17 16  CREATININE 0.77 0.94 0.88  CALCIUM 10.2 9.9 9.4   LFT  Recent Labs  12/10/14 1942  PROT 7.6  ALBUMIN 4.0  AST 20  ALT 15  ALKPHOS 89  BILITOT 0.5   PT/INR  Recent Labs  12/12/14 0005  LABPROT 23.6*  INR 2.09   Hepatitis Panel No results for input(s): HEPBSAG, HCVAB, HEPAIGM, HEPBIGM in the last 72 hours. C-Diff No results for input(s): CDIFFTOX in the last 72 hours. No results for input(s): CDIFFPCR in the last 72 hours.   Studies/Results: Ct Abdomen Pelvis W Contrast  12/10/2014   CLINICAL DATA:  Left lower quadrant pain for 2 days.  EXAM: CT ABDOMEN AND PELVIS WITH CONTRAST  TECHNIQUE: Multidetector CT imaging of the abdomen and pelvis was performed using the standard  protocol following bolus administration of intravenous contrast.  CONTRAST:  120mL OMNIPAQUE IOHEXOL 300 MG/ML  SOLN  COMPARISON:  Right upper quadrant  ultrasound 03/21/2014  FINDINGS: Lower chest: Linear areas of atelectasis or scarring in the lung bases. Heart is normal size. No effusions.  Hepatobiliary: Gallstones noted within the gallbladder. No focal hepatic abnormality.  Pancreas: No focal abnormality or ductal dilatation.  Spleen: No focal abnormality.  Normal size.  Adrenals/Urinary Tract: No focal renal or adrenal abnormality. No hydronephrosis. Urinary bladder is unremarkable.  Stomach/Bowel: Descending colonic and sigmoid diverticulosis. Inflammatory stranding around the proximal sigmoid colon compatible with active diverticulitis. No free air or extraluminal gas. No focal fluid collections. Stomach and small bowel are decompressed. Appendix is visualized and unremarkable.  Vascular/Lymphatic: No retroperitoneal or mesenteric adenopathy. Aorta normal caliber.  Reproductive: No mass or other significant abnormality.  Other: No free fluid or free air.  Musculoskeletal: No focal bone lesion or acute bony abnormality.  IMPRESSION: Colonic diverticulosis. Changes of active diverticulitis along the proximal sigmoid colon.  Cholelithiasis.   Electronically Signed   By: Rolm Baptise M.D.   On: 12/10/2014 21:23   Dg Abd Acute W/chest  12/11/2014   CLINICAL DATA:  Acute onset of vomiting and fever. Initial encounter.  EXAM: DG ABDOMEN ACUTE W/ 1V CHEST  COMPARISON:  CT of the abdomen and pelvis performed 12/10/2014, and chest radiograph performed 10/10/2014  FINDINGS: The lungs are well-aerated. Peribronchial thickening is noted. Minimal left basilar atelectasis is seen. There is no evidence of pleural effusion or pneumothorax. The cardiomediastinal silhouette is within normal limits.  The visualized bowel gas pattern is unremarkable. Scattered contrast is seen within the colon; there is no evidence of small bowel dilatation to suggest obstruction. Scattered diverticulosis is noted along the ascending and proximal transverse colon. No free intra-abdominal air  is identified on the provided upright view.  A pelvic mesh is noted.  No acute osseous abnormalities are seen; the sacroiliac joints are unremarkable in appearance. Cervical spinal fusion hardware is noted. The patient is status post right-sided rotator cuff repair.  IMPRESSION: 1. Colon partially filled with contrast, with scattered underlying diverticulosis. Unremarkable bowel gas pattern; no free intra-abdominal air seen. 2. Peribronchial thickening noted. Minimal left basilar atelectasis seen.   Electronically Signed   By: Garald Balding M.D.   On: 12/11/2014 22:45    Scheduled Inpatient Medications:   . ciprofloxacin  400 mg Intravenous Q12H  . diltiazem  300 mg Oral Daily  . ezetimibe  10 mg Oral Daily  . hydrochlorothiazide  25 mg Oral Daily  . loratadine  10 mg Oral Daily  . metronidazole  500 mg Intravenous Q8H  . mometasone-formoterol  2 puff Inhalation BID  . montelukast  10 mg Oral QHS  . sodium chloride  3 mL Intravenous Q12H  . theophylline  300 mg Oral BID  . warfarin  4 mg Oral q1800  . Warfarin - Physician Dosing Inpatient   Does not apply q1800    Continuous Inpatient Infusions:     PRN Inpatient Medications:  acetaminophen **OR** acetaminophen, albuterol, HYDROcodone-acetaminophen, ondansetron **OR** ondansetron (ZOFRAN) IV  Miscellaneous:   Assessment:  Acute diverticulitis. Stable. Currently on anti-biotics.  Plan:  1. Continue current or 2-3 days of IV antibody prior to changing over to by mouth to complete a course of 10 days. Clear liquids for now and a be able to advance tomorrow to full liquids pending on abdominal pain. Recommend a colonoscopy 3-4 weeks postantibiotic use. Will follow with  you.  Lollie Sails MD 12/12/2014, 4:31 PM

## 2014-12-12 NOTE — Care Management (Signed)
Admitted to [facility] with the diagnosis of sepsis. Lives alone. Neighbors are [place], [phone]). Home Health in the past thru [place] following shoulder/knee surgery. (2009 & 2012) [place] and [employer] in the past. Ambulates without assistive devices. Takes care of all instrumental and [place] activities of daily living herself, still drives. Last seen Dr. Lisette Grinder in April. Neighbors will transport. Shelbie Ammons RN MSN Care Management [phone]

## 2014-12-12 NOTE — Progress Notes (Signed)
   12/12/14 0915  Clinical Encounter Type  Visited With Patient  Visit Type Initial  Consult/Referral To Chaplain  Provided pastoral presence and support to patient.  Patient said she is "doing ok".  Pt told me she appreciated my visit.  I wished the pt a good day.  [place] [phone]

## 2014-12-12 NOTE — Plan of Care (Signed)
Problem: Discharge Progression Outcomes Goal: Discharge plan in place and appropriate Individualization Pt prefers to be called Ms. He.  Pt lives at home alone, but has neighbor support system.   Past MH - afib, asthma, HTN, GERD, arthritis, osteoporosis,  Controlled with home meds Low fall risk, pt is independent, gets out of bed and ambulates safely.     Goal: Other Discharge Outcomes/Goals Outcome: Progressing Plan of care progress to goal: Pain - pt having mild abdominal pain with activity, but is manageable Hemodynamically stable - vitals are stable, pt states she has a hx of afib Complications - pts nausea has subsided since arriving in ED Diet - pt is on low sodium diet at home, but appetite has been compromised since dx of diverticulitis Activity - pt walks independently, just slow since total knee replacement

## 2014-12-12 NOTE — Progress Notes (Signed)
[place] [facility] [place] NAME: Laura Gonzalez    MR#:  [id number]  DATE OF BIRTH:  23-Aug-1940  SUBJECTIVE:  CHIEF COMPLAINT:   Chief Complaint  Patient presents with  . Fever  . Nausea   pain is about 3 out of 10. REVIEW OF SYSTEMS:  Review of Systems  Constitutional: Negative for fever, weight loss, malaise/fatigue and diaphoresis.  HENT: Negative for ear discharge, ear pain, hearing loss, nosebleeds, sore throat and tinnitus.   Eyes: Negative for blurred vision and pain.  Respiratory: Negative for cough, hemoptysis, shortness of breath and wheezing.   Cardiovascular: Negative for chest pain, palpitations, orthopnea and leg swelling.  Gastrointestinal: Positive for nausea and abdominal pain. Negative for heartburn, vomiting, diarrhea, constipation and blood in stool.  Genitourinary: Negative for dysuria, urgency and frequency.  Musculoskeletal: Negative for myalgias and back pain.  Skin: Negative for itching and rash.  Neurological: Negative for dizziness, tingling, tremors, focal weakness, seizures, weakness and headaches.  Psychiatric/Behavioral: Negative for depression. The patient is not nervous/anxious.    DRUG ALLERGIES:   Allergies  Allergen Reactions  . Amoxicillin Other (See Comments)    Lips swelling, tingling  . Benzocaine-Menthol     Throat swelling  . Biafine [Wound Dressings] Swelling  . Chloraseptic Sore Throat [Acetaminophen] Other (See Comments)    throat swelling  . Fosamax [Alendronate Sodium]   . Neosporin [Neomycin-Bacitracin Zn-Polymyx] Other (See Comments)    Skin redness, puffy  . Other     "chlorotrimitron"-- rxn: throat swelling  . Oxycodone Other (See Comments)    Dizziness, lips tingling  . Statins Other (See Comments)    Muscle weakness, weak  . Tape Other (See Comments)    Skin redness   VITALS:  Blood pressure 108/53, pulse 80, temperature 99.1 F (37.3 C), temperature source Oral, resp.  rate 18, height 5\' 5"  (1.651 m), weight 104.101 kg (229 lb 8 oz), SpO2 93 %. PHYSICAL EXAMINATION:  Physical Exam  Constitutional: She is oriented to person, place, and time and well-developed, well-nourished, and in no distress.  HENT:  Head: Normocephalic and atraumatic.  Eyes: Conjunctivae and EOM are normal. Pupils are equal, round, and reactive to light.  Neck: Normal range of motion. Neck supple. No tracheal deviation present. No thyromegaly present.  Cardiovascular: Normal rate, regular rhythm and normal heart sounds.   Pulmonary/Chest: Effort normal and breath sounds normal. No respiratory distress. She has no wheezes. She exhibits no tenderness.  Abdominal: Soft. Bowel sounds are normal. She exhibits no distension. There is tenderness in the left lower quadrant.    Musculoskeletal: Normal range of motion.  Neurological: She is alert and oriented to person, place, and time. No cranial nerve deficit.  Skin: Skin is warm and dry. No rash noted.  Psychiatric: Mood and affect normal.   LABORATORY PANEL:  CBC Recent Labs Lab 12/12/14 0408  WBC 10.8  HGB 14.6  HCT 44.0  PLT 184  ------------------------------------------------------------------------------------------------------------------ Chemistries Recent Labs Lab 12/10/14 1942  12/12/14 0408  NA 141  < > 139  K 4.2  < > 3.7  CL 100*  < > 101  CO2 31  < > 31  GLUCOSE 95  < > 119*  BUN 22*  < > 16  CREATININE 0.77  < > 0.88  CALCIUM 10.2  < > 9.4  AST 20  --   --   ALT 15  --   --   ALKPHOS 89  --   --  BILITOT 0.5  --   --   < > = values in this interval not displayed. RADIOLOGY:  Dg Abd Acute W/chest  12/11/2014   CLINICAL DATA:  Acute onset of vomiting and fever. Initial encounter.  EXAM: DG ABDOMEN ACUTE W/ 1V CHEST  COMPARISON:  CT of the abdomen and pelvis performed 12/10/2014, and chest radiograph performed 10/10/2014  FINDINGS: The lungs are well-aerated. Peribronchial thickening is noted. Minimal left  basilar atelectasis is seen. There is no evidence of pleural effusion or pneumothorax. The cardiomediastinal silhouette is within normal limits.  The visualized bowel gas pattern is unremarkable. Scattered contrast is seen within the colon; there is no evidence of small bowel dilatation to suggest obstruction. Scattered diverticulosis is noted along the ascending and proximal transverse colon. No free intra-abdominal air is identified on the provided upright view.  A pelvic mesh is noted.  No acute osseous abnormalities are seen; the sacroiliac joints are unremarkable in appearance. Cervical spinal fusion hardware is noted. The patient is status post right-sided rotator cuff repair.  IMPRESSION: 1. Colon partially filled with contrast, with scattered underlying diverticulosis. Unremarkable bowel gas pattern; no free intra-abdominal air seen. 2. Peribronchial thickening noted. Minimal left basilar atelectasis seen.   Electronically Signed   By: Garald Balding M.D.   On: 12/11/2014 22:45   ASSESSMENT AND PLAN:   * Sepsis - present on admission likely secondary to diverticulitis. continue IV antibiotics, blood cultures neg so far.  * Diverticulitis - failed outpatient therapy due to unable to tolerate oral antibiotics, we will have her on antiemetics, and IV antibiotics.  This is her first episode of diverticulitis - consult GI  * A-fib - chronic, on chronic anticoagulation for this with warfarin.  Pharmacy managing anticoagulation. Continue home meds for rate control.  * HTN (hypertension) - continue home meds  * GERD (gastroesophageal reflux disease) - continue home dose PPI  * Asthma - continue home inhalers   All the records are reviewed and case discussed with Care Management/Social Workerr. Management plans discussed with the patient, family and they are in agreement.  CODE STATUS: Full Code  TOTAL TIME TAKING CARE OF THIS PATIENT: 55 minutes.   More than 50% of the time was spent in  counseling/coordination of care: YES  POSSIBLE D/C IN 1-2 DAYS, DEPENDING ON CLINICAL CONDITION.   [facility], [name] M.D on 12/12/2014 at 10:41 AM  Between 7am to 6pm - Pager - [phone]  After 6pm go to [email or web address] - password [email or web address] [facility]  Office  [phone]  CC:  Primary care physician; [name], MD

## 2014-12-12 NOTE — Plan of Care (Signed)
Problem: Discharge Progression Outcomes Goal: Other Discharge Outcomes/Goals Outcome: Progressing Plan of care progress to goal:  Patient c/o mild abdominal pain with activity; prefers no medication at this time. Patient is now on clear liquid diet - tolerating well. Up to bathroom with standby assist. IV antibiotics continue. NS infusion complete.

## 2014-12-12 NOTE — Consult Note (Signed)
GI Inpatient Consult Note  Reason for Consult: Diverticulitis   Attending Requesting Consult: Dr. Manuella Ghazi  History of Present Illness: Laura Gonzalez is a 74 y.o. female who reports that she started feeling bad Sunday, 12/09/2014.  She reports that she overate and ate a lot of fresh fruits and vegetables the day previous and thought that was the culprit. She reports that on Monday she rolled over onto her left side and the pain was 10/10.  She went to the [facility] and was sent to [facility] ED.  She was given an oral dose of Cipro and Flagyl in the ED and released home.  She reports that she picked up her script Tuesday morning and took the next dose around 9:30 am.  She then reports taht she started having nausea, vomiting, chills and a fever of 101.3 around noon and shortly after she felt as if she was going into AFib.  She called an ambulance and was brought back to the ED.  She has never had diverticulitis before.  She is feeling better today and has been able to tolerate the meds and a liquid diet.  She is still tender when she moves.  She use to take Prilosec for gerd sxs, but has since discontinued the treatment due to her osteoarthritis becoming worse.  She denies any recent GERD sxs.  She is followed by Dr. Vira Agar and Dawson Bills, NP at [facility]. Her last colonoscopy was was 07/07/2010; indication: personal history of colonic polyps Findings: four 5 mm polyps in the recto-sigmoid colon,  resected and retrieved.  Diverticulosis sigmoid colon, descending colon, transverse  Colon and ascending colon. Internal hemorrhoids. Pathology: hyperplastic polyps - due for repeat 06/2015  Past Medical History:  Past Medical History  Diagnosis Date  . Dysrhythmia     afib  . Pneumonia   . Asthma   . Numbness and tingling     arm to leg  . Arthritis     wrist and knees  . Hernia, umbilical   . Shortness of breath     only with astma attacks  . Sleep apnea   . Cancer     skin cancer  . Osteoporosis   .  Hypertension   . GERD (gastroesophageal reflux disease)   . Diverticulosis     Problem List: Patient Active Problem List   Diagnosis Date Noted  . Diverticulitis 12/11/2014  . Sepsis 12/11/2014  . A-fib 12/11/2014  . HTN (hypertension) 12/11/2014  . GERD (gastroesophageal reflux disease) 12/11/2014  . Asthma 12/11/2014    Past Surgical History: Past Surgical History  Procedure Laterality Date  . Tonsillectomy  1960  . Umbilical hernia repair  [id number]  . Wisdom tooth extraction  1963  . Wrist surgery Right 1998    external fixator  . Colonscopy  2000,2007,2012  . Rotator cuff repair Bilateral right- 2009, left 2011  . Skin cancer excision  2009    back of neck and right cheek  . Lipoma excision Right 2010    back  . Varicose vein surgery Left 04/2009 rt 2011  . Anterior cervical decomp/discectomy fusion N/A 08/09/2012    Procedure: ANTERIOR CERVICAL DECOMPRESSION/DISCECTOMY FUSION 2 LEVELS;  Surgeon: [name], MD;  Location: [facility];  Service: Neurosurgery;  Laterality: N/A;  C3-4 C4-5 Anterior cervical decompression/diskectomy/fusion/[employer] Bone/Trestle plate    Allergies: Allergies  Allergen Reactions  . Amoxicillin Other (See Comments)    Lips swelling, tingling  . Benzocaine-Menthol     Throat swelling  .  Biafine [Wound Dressings] Swelling  . Chloraseptic Sore Throat [Acetaminophen] Other (See Comments)    throat swelling  . Fosamax [Alendronate Sodium]   . Neosporin [Neomycin-Bacitracin Zn-Polymyx] Other (See Comments)    Skin redness, puffy  . Other     "chlorotrimitron"-- rxn: throat swelling  . Oxycodone Other (See Comments)    Dizziness, lips tingling  . Statins Other (See Comments)    Muscle weakness, weak  . Tape Other (See Comments)    Skin redness    Home Medications: Prescriptions prior to admission  Medication Sig Dispense Refill Last Dose  . Calcium Citrate (CITRACAL PO) Take 1 tablet by mouth 2 (two) times daily.   12/11/2014 at  Unknown time  . ciprofloxacin (CIPRO) 500 MG tablet Take 1 tablet (500 mg total) by mouth 2 (two) times daily. 28 tablet 0 12/11/2014 at Unknown time  . diltiazem (CARDIZEM CD) 300 MG 24 hr capsule Take 300 mg by mouth daily.   12/11/2014 at Unknown time  . ezetimibe (ZETIA) 10 MG tablet Take 10 mg by mouth at bedtime.   12/11/2014 at Unknown time  . Fluticasone-Salmeterol (ADVAIR) 100-50 MCG/DOSE AEPB Inhale 1 puff into the lungs every 12 (twelve) hours.   12/11/2014 at Unknown time  . hydrochlorothiazide (HYDRODIURIL) 25 MG tablet Take 25 mg by mouth daily.   12/11/2014 at Unknown time  . magnesium oxide (MAG-OX) 400 MG tablet Take 400 mg by mouth daily.   12/11/2014 at Unknown time  . metroNIDAZOLE (FLAGYL) 500 MG tablet Take 1 tablet (500 mg total) by mouth 3 (three) times daily. 42 tablet 0 12/11/2014 at Unknown time  . Misc Natural Products (OSTEO BI-FLEX JOINT SHIELD PO) Take 1 tablet by mouth daily.   12/11/2014 at Unknown time  . montelukast (SINGULAIR) 10 MG tablet Take 10 mg by mouth at bedtime.   12/11/2014 at Unknown time  . Multiple Vitamin (MULTIVITAMIN WITH MINERALS) TABS Take 1 tablet by mouth daily.   12/11/2014 at Unknown time  . Multiple Vitamins-Minerals (PRESERVISION AREDS PO) Take 1 tablet by mouth daily.   12/11/2014 at Unknown time  . Omega-3 Fatty Acids (FISH OIL) 1200 MG CAPS Take 1,200 mg by mouth daily.   12/11/2014 at Unknown time  . OVER THE COUNTER MEDICATION Take 3,000 Units by mouth daily. Vitamin D3   12/11/2014 at Unknown time  . potassium chloride SA (K-DUR,KLOR-CON) 20 MEQ tablet Take 20 mEq by mouth 3 (three) times daily.   12/11/2014 at Unknown time  . theophylline (THEODUR) 300 MG 12 hr tablet Take 300 mg by mouth 2 (two) times daily.   12/11/2014 at Unknown time  . vitamin B-12 (CYANOCOBALAMIN) 1000 MCG tablet Take 1,000 mcg by mouth daily.   12/11/2014 at Unknown time  . warfarin (COUMADIN) 6 MG tablet Take 4 mg by mouth daily at 6 PM.    12/11/2014 at Unknown time  .  albuterol (PROVENTIL HFA;VENTOLIN HFA) 108 (90 BASE) MCG/ACT inhaler Inhale 2 puffs into the lungs every 6 (six) hours as needed. For shortness of breath   12/11/2014  . albuterol (PROVENTIL) (2.5 MG/3ML) 0.083% nebulizer solution Take 2.5 mg by nebulization every 6 (six) hours as needed. For shortness of breath   Not Taking at Unknown time  . cyclobenzaprine (FLEXERIL) 10 MG tablet Take 1 tablet (10 mg total) by mouth 3 (three) times daily as needed for muscle spasms. 40 tablet 1 Not Taking at Unknown time  . diltiazem (CARDIZEM) 60 MG tablet Take 120 mg by mouth every 6 (  six) hours as needed. Takes if has palpitation after taking diltiazem 300mg  dose   Not Taking at Unknown time  . loratadine (CLARITIN) 10 MG tablet Take 10 mg by mouth daily.   Not Taking at Unknown time  . mometasone (NASONEX) 50 MCG/ACT nasal spray Place 1 spray into the nose daily.   Not Taking at Unknown time  . traMADol (ULTRAM) 50 MG tablet Take 1 tablet (50 mg total) by mouth every 6 (six) hours as needed. 20 tablet 0 Not Taking at Unknown time  . traMADol-acetaminophen (ULTRACET) 37.5-325 MG per tablet Take 1-2 tablets by mouth every 6 (six) hours as needed for pain. 41 tablet 0 Not Taking at Unknown time   Home medication reconciliation was completed with the patient.   Scheduled Inpatient Medications:   . ciprofloxacin  400 mg Intravenous Q12H  . diltiazem  300 mg Oral Daily  . hydrochlorothiazide  25 mg Oral Daily  . metroNIDAZOLE      . metronidazole  500 mg Intravenous Q8H  . mometasone-formoterol  2 puff Inhalation BID  . montelukast  10 mg Oral QHS  . sodium chloride  3 mL Intravenous Q12H  . theophylline  300 mg Oral BID  . warfarin  4 mg Oral q1800  . Warfarin - Physician Dosing Inpatient   Does not apply q1800    Continuous Inpatient Infusions:   . sodium chloride 75 mL/hr at 12/12/14 0702    PRN Inpatient Medications:  acetaminophen **OR** acetaminophen, albuterol, HYDROcodone-acetaminophen,  ondansetron **OR** ondansetron (ZOFRAN) IV  Family History: family history includes Arthritis in her mother; Breast cancer in her mother; Heart attack in her mother; Hypertension in her mother; Pulmonary embolism in her mother; Stomach cancer in her maternal aunt and maternal grandfather; Throat cancer in her maternal uncle.  The patient's family history is negative for inflammatory bowel disorders, GI malignancy, or solid organ transplantation.  Social History:   reports that she has never smoked. She does not have any smokeless tobacco history on file. She reports that she does not drink alcohol or use illicit drugs. The patient denies ETOH, tobacco, or drug use.   Review of Systems: Constitutional: Weight is stable.  Eyes: No changes in vision. ENT: No oral lesions, sore throat.  GI: see HPI.  Heme/Lymph: No easy bruising.  CV: No chest pain.  GU: No hematuria.  Integumentary: No rashes.  Neuro: No headaches.  Psych: No depression/anxiety.  Endocrine: No heat/cold intolerance.  Allergic/Immunologic: No urticaria.  Resp: No cough, SOB.  Musculoskeletal: No joint swelling.    Physical Examination: BP 108/53 mmHg  Pulse 80  Temp(Src) 99.1 F (37.3 C) (Oral)  Resp 18  Ht 5\' 5"  (1.651 m)  Wt 104.101 kg (229 lb 8 oz)  BMI 38.19 kg/m2  SpO2 93% Gen: NAD, alert and oriented x 4 HEENT: PEERLA, EOMI, Neck: supple, no JVD or thyromegaly Chest: CTA bilaterally, no wheezes, crackles, or other adventitious sounds CV: RRR, no m/g/c/r Abd: soft, NT, ND, +BS in all four quadrants; no HSM, guarding, ridigity, or rebound tenderness Ext: no edema, well perfused with 2+ pulses, Skin: no rash or lesions noted Lymph: no LAD  Data: Lab Results  Component Value Date   WBC 10.8 12/12/2014   HGB 14.6 12/12/2014   HCT 44.0 12/12/2014   MCV 92.6 12/12/2014   PLT 184 12/12/2014    Recent Labs Lab 12/10/14 1942 12/12/14 0206 12/12/14 0408  HGB 15.3 14.2 14.6   Lab Results  Component  Value Date  NA 139 12/12/2014   K 3.7 12/12/2014   CL 101 12/12/2014   CO2 31 12/12/2014   BUN 16 12/12/2014   CREATININE 0.88 12/12/2014   Lab Results  Component Value Date   ALT 15 12/10/2014   AST 20 12/10/2014   ALKPHOS 89 12/10/2014   BILITOT 0.5 12/10/2014    Recent Labs Lab 12/12/14 0005  INR 2.09   Imaging:  CLINICAL DATA: Left lower quadrant pain for 2 days.  EXAM: CT ABDOMEN AND PELVIS WITH CONTRAST  TECHNIQUE: Multidetector CT imaging of the abdomen and pelvis was performed using the standard protocol following bolus administration of intravenous contrast.  CONTRAST: 165mL OMNIPAQUE IOHEXOL 300 MG/ML SOLN  COMPARISON: Right upper quadrant ultrasound 03/21/2014  FINDINGS: Lower chest: Linear areas of atelectasis or scarring in the lung bases. Heart is normal size. No effusions.  Hepatobiliary: Gallstones noted within the gallbladder. No focal hepatic abnormality.  Pancreas: No focal abnormality or ductal dilatation.  Spleen: No focal abnormality. Normal size.  Adrenals/Urinary Tract: No focal renal or adrenal abnormality. No hydronephrosis. Urinary bladder is unremarkable.  Stomach/Bowel: Descending colonic and sigmoid diverticulosis. Inflammatory stranding around the proximal sigmoid colon compatible with active diverticulitis. No free air or extraluminal gas. No focal fluid collections. Stomach and small bowel are decompressed. Appendix is visualized and unremarkable.  Vascular/Lymphatic: No retroperitoneal or mesenteric adenopathy. Aorta normal caliber.  Reproductive: No mass or other significant abnormality.  Other: No free fluid or free air.  Musculoskeletal: No focal bone lesion or acute bony abnormality.  IMPRESSION: Colonic diverticulosis. Changes of active diverticulitis along the proximal sigmoid colon.  Cholelithiasis.   Electronically Signed  By: Rolm Baptise M.D.  On: 12/10/2014  21:23  CLINICAL DATA: Acute onset of vomiting and fever. Initial encounter.  EXAM: DG ABDOMEN ACUTE W/ 1V CHEST  COMPARISON: CT of the abdomen and pelvis performed 12/10/2014, and chest radiograph performed 10/10/2014  FINDINGS: The lungs are well-aerated. Peribronchial thickening is noted. Minimal left basilar atelectasis is seen. There is no evidence of pleural effusion or pneumothorax. The cardiomediastinal silhouette is within normal limits.  The visualized bowel gas pattern is unremarkable. Scattered contrast is seen within the colon; there is no evidence of small bowel dilatation to suggest obstruction. Scattered diverticulosis is noted along the ascending and proximal transverse colon. No free intra-abdominal air is identified on the provided upright view.  A pelvic mesh is noted.  No acute osseous abnormalities are seen; the sacroiliac joints are unremarkable in appearance. Cervical spinal fusion hardware is noted. The patient is status post right-sided rotator cuff repair.  IMPRESSION: 1. Colon partially filled with contrast, with scattered underlying diverticulosis. Unremarkable bowel gas pattern; no free intra-abdominal air seen. 2. Peribronchial thickening noted. Minimal left basilar atelectasis seen.   Electronically Signed  By: Garald Balding M.D.  On: 12/11/2014 22:45 Assessment/Plan: Laura Gonzalez is a 74 y.o. female   Recommendations: Laura Gonzalez was sitting in the chair for my visit.  She is tolerating a clear liquid diet.  We agree with the Flagyl and Cipro, recommend 3 days of IV abx and then change to PO for a total of 10 days.  If she is not progressing well, we recommend serial films. She does not take a PPI at home due to worsening osteoarthritis. We will continue to follow her during her stay. She should follow up with Dr. Vira Agar for a repeat colonoscopy approx 1 month after symptoms have resolved. Thank you for the consult. Please call  with questions or concerns.  Aune Adami  Elna Breslow, PA-C  I personally performed these services.

## 2014-12-13 LAB — PROTIME-INR
INR: 2.55
Prothrombin Time: 27.5 seconds — ABNORMAL HIGH (ref 11.4–15.0)

## 2014-12-13 MED ORDER — METRONIDAZOLE 500 MG PO TABS
500.0000 mg | ORAL_TABLET | Freq: Three times a day (TID) | ORAL | Status: DC
  Administered 2014-12-13 – 2014-12-17 (×11): 500 mg via ORAL
  Filled 2014-12-13 (×14): qty 1

## 2014-12-13 MED ORDER — CIPROFLOXACIN HCL 250 MG PO TABS
500.0000 mg | ORAL_TABLET | Freq: Two times a day (BID) | ORAL | Status: DC
  Administered 2014-12-13 – 2014-12-16 (×7): 500 mg via ORAL
  Filled 2014-12-13 (×2): qty 1
  Filled 2014-12-13 (×5): qty 2

## 2014-12-13 MED ORDER — DILTIAZEM HCL 25 MG/5ML IV SOLN
10.0000 mg | Freq: Once | INTRAVENOUS | Status: AC
  Administered 2014-12-13: 10 mg via INTRAVENOUS
  Filled 2014-12-13 (×2): qty 5

## 2014-12-13 NOTE — Progress Notes (Signed)
No complaints of nausea/vomiting with soft diet. Madlyn Frankel, RN

## 2014-12-13 NOTE — Progress Notes (Signed)
[place] at [place] NAME: Laura Gonzalez    MR#:  [id number]  DATE OF BIRTH:  [birth year]  SUBJECTIVE:  CHIEF COMPLAINT:   Chief Complaint  Patient presents with  . Fever  . Nausea  Pain is about 2-3 out of 10. Her IV is infiltrated and she is having some pain at IV site. Tolerated liquid diet REVIEW OF SYSTEMS:  Review of Systems  Constitutional: Negative for fever, weight loss, malaise/fatigue and diaphoresis.  HENT: Negative for ear discharge, ear pain, hearing loss, nosebleeds, sore throat and tinnitus.   Eyes: Negative for blurred vision and pain.  Respiratory: Negative for cough, hemoptysis, shortness of breath and wheezing.   Cardiovascular: Negative for chest pain, palpitations, orthopnea and leg swelling.  Gastrointestinal: Positive for nausea and abdominal pain. Negative for heartburn, vomiting, diarrhea, constipation and blood in stool.  Genitourinary: Negative for dysuria, urgency and frequency.  Musculoskeletal: Negative for myalgias and back pain.  Skin: Negative for itching and rash.  Neurological: Negative for dizziness, tingling, tremors, focal weakness, seizures, weakness and headaches.  Psychiatric/Behavioral: Negative for depression. The patient is not nervous/anxious.    DRUG ALLERGIES:   Allergies  Allergen Reactions  . Amoxicillin Other (See Comments)    Lips swelling, tingling  . Benzocaine-Menthol     Throat swelling  . Biafine [Wound Dressings] Swelling  . Chloraseptic Sore Throat [Acetaminophen] Other (See Comments)    throat swelling  . Fosamax [Alendronate Sodium]   . Neosporin [Neomycin-Bacitracin Zn-Polymyx] Other (See Comments)    Skin redness, puffy  . Other     "chlorotrimitron"-- rxn: throat swelling  . Oxycodone Other (See Comments)    Dizziness, lips tingling  . Statins Other (See Comments)    Muscle weakness, weak  . Tape Other (See Comments)    Skin redness   VITALS:  Blood  pressure 132/59, pulse 77, temperature 98.3 F (36.8 C), temperature source Oral, resp. rate 18, height 5\' 5"  (1.651 m), weight 104.101 kg (229 lb 8 oz), SpO2 96 %. PHYSICAL EXAMINATION:  Physical Exam  Constitutional: She is oriented to person, place, and time and well-developed, well-nourished, and in no distress.  HENT:  Head: Normocephalic and atraumatic.  Eyes: Conjunctivae and EOM are normal. Pupils are equal, round, and reactive to light.  Neck: Normal range of motion. Neck supple. No tracheal deviation present. No thyromegaly present.  Cardiovascular: Normal rate, regular rhythm and normal heart sounds.   Pulmonary/Chest: Effort normal and breath sounds normal. No respiratory distress. She has no wheezes. She exhibits no tenderness.  Abdominal: Soft. Bowel sounds are normal. She exhibits no distension. There is tenderness in the left lower quadrant.    Musculoskeletal: Normal range of motion.  Neurological: She is alert and oriented to person, place, and time. No cranial nerve deficit.  Skin: Skin is warm and dry. No rash noted.  Psychiatric: Mood and affect normal.   LABORATORY PANEL:  CBC  Recent Labs Lab 12/12/14 0408  WBC 10.8  HGB 14.6  HCT 44.0  PLT 184  ------------------------------------------------------------------------------------------------------------------ Chemistries Recent Labs Lab 12/10/14 1942  12/12/14 0408  NA 141  < > 139  K 4.2  < > 3.7  CL 100*  < > 101  CO2 31  < > 31  GLUCOSE 95  < > 119*  BUN 22*  < > 16  CREATININE 0.77  < > 0.88  CALCIUM 10.2  < > 9.4  AST 20  --   --  ALT 15  --   --   ALKPHOS 89  --   --   BILITOT 0.5  --   --   < > = values in this interval not displayed.   ASSESSMENT AND PLAN:   * Sepsis - present on admission likely secondary to diverticulitis. Switch to PO antibiotics, blood cultures neg so far.  * Diverticulitis - failed outpatient therapy due to unable to tolerate oral antibiotics, we will have her  on antiemetics, and change to PO antibiotics as her IV is infiltrated.  * A-fib - chronic, on chronic anticoagulation for this with warfarin.  Pharmacy managing anticoagulation. Continue home meds for rate control.  * HTN (hypertension) - continue home meds  * GERD (gastroesophageal reflux disease) - continue home dose PPI  * Asthma - continue home inhalers   All the records are reviewed and case discussed with Care Management/Social Worker. Management plans discussed with the patient, family and they are in agreement.  CODE STATUS: Full Code  TOTAL TIME TAKING CARE OF THIS PATIENT: 55 minutes.   More than 50% of the time was spent in counseling/coordination of care: YES  POSSIBLE D/C IN AM, DEPENDING ON CLINICAL CONDITION if she tolerates soft diet and pain controlled.   [facility], Amarionna Arca M.D on 12/13/2014 at 2:59 PM  Between 7am to 6pm - Pager - [phone]  After 6pm go to [email or web address] - password [email or web address] [place] [facility]  Office  [phone]  CC:  Primary care physician; [name], MD

## 2014-12-13 NOTE — Plan of Care (Signed)
Problem: Discharge Progression Outcomes Goal: Other Discharge Outcomes/Goals Plan of care progress to goals: 1. Pain: pt having mild abdominal pain with activity prefers no medication at this time.  2. Hemodynamically: VSS, febrile at bedtime, IV Abx continue as scheduled  3. Diet: tolerating clear liquid diet  4. Activity: standby assist to bathroom

## 2014-12-13 NOTE — Progress Notes (Signed)
ANTICOAGULATION CONSULT NOTE - Initial Consult  Pharmacy Consult for Warfarin Indication: atrial fibrillation    Labs:  Recent Labs  12/10/14 1942 12/12/14 0005 12/12/14 0206 12/12/14 0408 12/13/14 0425  HGB 15.3  --  14.2 14.6  --   HCT 46.3  --  42.9 44.0  --   PLT 227  --  198 184  --   LABPROT  --  23.6*  --   --  27.5*  INR  --  2.09  --   --  2.55  CREATININE 0.77 0.94  --  0.88  --     Estimated Creatinine Clearance: 69.1 mL/min (by C-G formula based on Cr of 0.88).  Assessment: Pharmacy consulted to manage warfarin therapy in this 74 year old female admitted for diverticulitis being treated with cipro and flagyl  Patient taking warfarin 4mg  daily at home  INR: 2.55, therapeutic   Goal of Therapy:  INR 2-3   Plan:  Will continue home regimen of warfarin 4mg  daily at this time. Will check daily INR while on antimicrobials, per policy.  Pharmacy to follow per consult.  Rexene Edison, PharmD Clinical Pharmacist  12/13/2014,3:09 PM

## 2014-12-13 NOTE — Plan of Care (Signed)
Problem: Discharge Progression Outcomes Goal: Other Discharge Outcomes/Goals Outcome: Progressing Plan of care progress to goal:  Patient continues to have mild abdominal pain with activity (scoring pain 2 out of 10) - prefers to not take pain medication at this time. Diet advanced to soft diet - will receive first tray this afternoon. IV antibiotics discontinued. PO antibiotics initiated. Up to bathroom with standby assist. No complaints of nausea/vomiting.

## 2014-12-13 NOTE — Progress Notes (Signed)
GI Inpatient Follow-up Note  Patient Identification: Laura Gonzalez is a 74 y.o. female with diverticulitis and abdominal pain.  Subjective: Laura Gonzalez reports that her abdominal pain is still a 4/10 when she moves.  When she is in a still position, either in the chair or in the bed, she does not have any pain.  She reports having a lot of gas since yesterday afternoon.  She denies having a BM since she has been here.  She did report some nausea and some vomiting last evening after having some broth. She had some jello and 4 or 5 spoons of vegetable broth today and has tolerated it well without nausea or vomiting. She reports feeling a little better than she did yesterday.  Scheduled Inpatient Medications:  . ciprofloxacin  400 mg Intravenous Q12H  . diltiazem  300 mg Oral Daily  . ezetimibe  10 mg Oral Daily  . hydrochlorothiazide  25 mg Oral Daily  . loratadine  10 mg Oral Daily  . metronidazole  500 mg Intravenous Q8H  . mometasone-formoterol  2 puff Inhalation BID  . montelukast  10 mg Oral QHS  . sodium chloride  3 mL Intravenous Q12H  . theophylline  300 mg Oral BID  . warfarin  4 mg Oral q1800  . Warfarin - Physician Dosing Inpatient   Does not apply q1800    Continuous Inpatient Infusions:     PRN Inpatient Medications:  acetaminophen **OR** acetaminophen, albuterol, HYDROcodone-acetaminophen, ondansetron **OR** ondansetron (ZOFRAN) IV  Review of Systems: Constitutional: Weight is stable.  Eyes: No changes in vision. ENT: No oral lesions, sore throat.  GI: see HPI.  Heme/Lymph: No easy bruising.  CV: No chest pain.  GU: No hematuria.  Integumentary: No rashes.  Neuro: No headaches.  Psych: No depression/anxiety.  Endocrine: No heat/cold intolerance.  Allergic/Immunologic: No urticaria.  Resp: No cough, SOB.  Musculoskeletal: No joint swelling.    Physical Examination: BP 122/56 mmHg  Pulse 88  Temp(Src) 98.4 F (36.9 C) (Oral)  Resp 18  Ht 5\' 5"  (1.651 m)   Wt 104.101 kg (229 lb 8 oz)  BMI 38.19 kg/m2  SpO2 93% Gen: NAD, alert and oriented x 4 HEENT: PEERLA, EOMI, Neck: supple, no JVD or thyromegaly Chest: CTA bilaterally, no wheezes, crackles, or other adventitious sounds CV: RRR, no m/g/c/r Abd: soft, LLQ tenderness, ND, +BS in all four quadrants; no HSM, guarding, ridigity, or rebound tenderness Ext: no edema, well perfused with 2+ pulses, Skin: no rash or lesions noted Lymph: no LAD  Data: Lab Results  Component Value Date   WBC 10.8 12/12/2014   HGB 14.6 12/12/2014   HCT 44.0 12/12/2014   MCV 92.6 12/12/2014   PLT 184 12/12/2014    Recent Labs Lab 12/10/14 1942 12/12/14 0206 12/12/14 0408  HGB 15.3 14.2 14.6   Lab Results  Component Value Date   NA 139 12/12/2014   K 3.7 12/12/2014   CL 101 12/12/2014   CO2 31 12/12/2014   BUN 16 12/12/2014   CREATININE 0.88 12/12/2014   Lab Results  Component Value Date   ALT 15 12/10/2014   AST 20 12/10/2014   ALKPHOS 89 12/10/2014   BILITOT 0.5 12/10/2014    Recent Labs Lab 12/13/14 0425  INR 2.55   Assessment/Plan: Laura Gonzalez is a 74 y.o. female   Recommendations:  Continue with current abx treatments.  Continue with clear liquids for now until her abdominal pain improves. We will continue to follow. Please call with questions  or concerns.  Salvadore Farber, PA-C  I personally performed these services.

## 2014-12-14 ENCOUNTER — Inpatient Hospital Stay: Payer: Medicare Other

## 2014-12-14 LAB — PROTIME-INR
INR: 2.73
Prothrombin Time: 29 seconds — ABNORMAL HIGH (ref 11.4–15.0)

## 2014-12-14 LAB — GLUCOSE, CAPILLARY: Glucose-Capillary: 142 mg/dL — ABNORMAL HIGH (ref 65–99)

## 2014-12-14 LAB — MRSA PCR SCREENING: MRSA by PCR: NEGATIVE

## 2014-12-14 MED ORDER — FUROSEMIDE 10 MG/ML IJ SOLN
40.0000 mg | Freq: Once | INTRAMUSCULAR | Status: AC
  Administered 2014-12-14: 40 mg via INTRAVENOUS
  Filled 2014-12-14: qty 4

## 2014-12-14 MED ORDER — DOCUSATE SODIUM 100 MG PO CAPS
100.0000 mg | ORAL_CAPSULE | Freq: Every day | ORAL | Status: DC | PRN
  Administered 2014-12-14: 100 mg via ORAL
  Filled 2014-12-14: qty 1

## 2014-12-14 MED ORDER — DILTIAZEM HCL 100 MG IV SOLR
5.0000 mg/h | INTRAVENOUS | Status: DC
  Administered 2014-12-14: 5 mg/h via INTRAVENOUS
  Filled 2014-12-14: qty 100

## 2014-12-14 MED ORDER — DEXTROSE 5 % IV SOLN
5.0000 mg/h | INTRAVENOUS | Status: DC
  Filled 2014-12-14: qty 100

## 2014-12-14 MED ORDER — BISACODYL 5 MG PO TBEC
5.0000 mg | DELAYED_RELEASE_TABLET | Freq: Every day | ORAL | Status: DC | PRN
  Administered 2014-12-16: 5 mg via ORAL
  Filled 2014-12-14: qty 1

## 2014-12-14 NOTE — Progress Notes (Signed)
Pt HR still increasing up to 170s on tele monitor. Doc aware, ordered to give morning dose of cardizem po now instead of am and reassess.

## 2014-12-14 NOTE — Plan of Care (Signed)
Problem: Discharge Progression Outcomes Goal: Other Discharge Outcomes/Goals Outcome: Not Progressing Pt complains of mild to moderate pain in abd and head. HR afib Doc made aware of HR several times this shift, medications given as ordered. Will reassess in 1 hr and notify doc.

## 2014-12-14 NOTE — Progress Notes (Signed)
Pt complains of afib, Doc notified of HR,  pt put on telemetry

## 2014-12-14 NOTE — Progress Notes (Signed)
Doc notified of Afib on telemetry as well as increased HR, orders put in for cardizem 10mg  IV once. HR has decreased.

## 2014-12-14 NOTE — Progress Notes (Signed)
Received pt to room  Oxygen at 3 liters nasal cannular.  Pt still states she is having some breathing difficulties but is better than it was previously.  Received lasix earlier today and is using the bedpan often.  Skin checked and pt made aware of the visiting hours and dinner times and how to call the nurse and CNA.

## 2014-12-14 NOTE — Progress Notes (Signed)
[place] at [place] NAME: Laura Gonzalez    MR#:  [id number]  DATE OF BIRTH:  02/20/1941  SUBJECTIVE:  CHIEF COMPLAINT:   Chief Complaint  Patient presents with  . Fever  . Nausea  was doing well until afternoon but then late last evening, she got into rapid a.fib not able to control without cardizem drip and required transfer to CCU for same. Now seem to be ok, having some pain in rt flank likely from bed transfer process. REVIEW OF SYSTEMS:  Review of Systems  Constitutional: Negative for fever, weight loss, malaise/fatigue and diaphoresis.  HENT: Negative for ear discharge, ear pain, hearing loss, nosebleeds, sore throat and tinnitus.   Eyes: Negative for blurred vision and pain.  Respiratory: Negative for cough, hemoptysis, shortness of breath and wheezing.   Cardiovascular: Positive for palpitations. Negative for chest pain, orthopnea and leg swelling.  Gastrointestinal: Positive for nausea and abdominal pain. Negative for heartburn, vomiting, diarrhea, constipation and blood in stool.  Genitourinary: Negative for dysuria, urgency and frequency.  Musculoskeletal: Negative for myalgias and back pain.  Skin: Negative for itching and rash.  Neurological: Negative for dizziness, tingling, tremors, focal weakness, seizures, weakness and headaches.  Psychiatric/Behavioral: Negative for depression. The patient is not nervous/anxious.    DRUG ALLERGIES:   Allergies  Allergen Reactions  . Amoxicillin Other (See Comments)    Lips swelling, tingling  . Benzocaine-Menthol     Throat swelling  . Biafine [Wound Dressings] Swelling  . Chloraseptic Sore Throat [Acetaminophen] Other (See Comments)    throat swelling  . Fosamax [Alendronate Sodium]   . Neosporin [Neomycin-Bacitracin Zn-Polymyx] Other (See Comments)    Skin redness, puffy  . Other     "chlorotrimitron"-- rxn: throat swelling  . Oxycodone Other (See Comments)   Dizziness, lips tingling  . Statins Other (See Comments)    Muscle weakness, weak  . Tape Other (See Comments)    Skin redness   VITALS:  Blood pressure 123/63, pulse 92, temperature 99.5 F (37.5 C), temperature source Oral, resp. rate 20, height 5\' 5"  (1.651 m), weight 102.286 kg (225 lb 8 oz), SpO2 93 %. PHYSICAL EXAMINATION:  Physical Exam  Constitutional: She is oriented to person, place, and time and well-developed, well-nourished, and in no distress.  HENT:  Head: Normocephalic and atraumatic.  Eyes: Conjunctivae and EOM are normal. Pupils are equal, round, and reactive to light.  Neck: Normal range of motion. Neck supple. No tracheal deviation present. No thyromegaly present.  Cardiovascular: Normal rate, regular rhythm and normal heart sounds.   Pulmonary/Chest: Effort normal and breath sounds normal. No respiratory distress. She has no wheezes. She exhibits no tenderness.  Abdominal: Soft. Bowel sounds are normal. She exhibits no distension. There is tenderness in the left lower quadrant.    Musculoskeletal: Normal range of motion.  Neurological: She is alert and oriented to person, place, and time. No cranial nerve deficit.  Skin: Skin is warm and dry. No rash noted.  Psychiatric: Mood and affect normal.   LABORATORY PANEL:  CBC  Recent Labs Lab 12/12/14 0408  WBC 10.8  HGB 14.6  HCT 44.0  PLT 184  ------------------------------------------------------------------------------------------------------------------ Chemistries Recent Labs Lab 12/10/14 1942  12/12/14 0408  NA 141  < > 139  K 4.2  < > 3.7  CL 100*  < > 101  CO2 31  < > 31  GLUCOSE 95  < > 119*  BUN 22*  < > 16  CREATININE 0.77  < > 0.88  CALCIUM 10.2  < > 9.4  AST 20  --   --   ALT 15  --   --   ALKPHOS 89  --   --   BILITOT 0.5  --   --   < > = values in this interval not displayed.   ASSESSMENT AND PLAN:   * Sepsis - present on admission likely secondary to diverticulitis. Switch to PO  antibiotics, blood cultures neg so far.  * Diverticulitis - failed outpatient therapy due to unable to tolerate oral antibiotics, we will have her on antiemetics, and change to PO antibiotics as her IV is infiltrated.  * Rapid A-fib - Acute on chronic, on chronic anticoagulation for this with warfarin.  Pharmacy managing anticoagulation. Continue home meds for rate control. Required cardizem drip overnight, but rate is better controlled - will transfer her out to tele once weaned off drip.  * HTN (hypertension) - continue home meds  * GERD (gastroesophageal reflux disease) - continue home dose PPI  * Asthma - continue home inhalers  * constipation: on bowel regimen, needs BM. GI is ordered bowel films  All the records are reviewed and case discussed with Care Management/Social Worker. Management plans discussed with the patient and she is in agreement.  CODE STATUS: Full Code  TOTAL TIME TAKING CARE OF THIS PATIENT: 55 minutes.   More than 50% of the time was spent in counseling/coordination of care: YES  POSSIBLE D/C IN 1-2 days, DEPENDING ON CLINICAL CONDITION if her a.fib remains under control.   [facility], Synthia Fairbank M.D on 12/14/2014 at 4:24 PM  Between 7am to 6pm - Pager - [phone]  After 6pm go to [email or web address] - password [email or web address] [place] [facility]  Office  [phone]  CC:  Primary care physician; [name], MD

## 2014-12-14 NOTE — Progress Notes (Signed)
ANTICOAGULATION CONSULT NOTE - Initial Consult  Pharmacy Consult for Warfarin Indication: atrial fibrillation    Labs:  Recent Labs  12/12/14 0005 12/12/14 0206 12/12/14 0408 12/13/14 0425 12/14/14 0557  HGB  --  14.2 14.6  --   --   HCT  --  42.9 44.0  --   --   PLT  --  198 184  --   --   LABPROT 23.6*  --   --  27.5* 29.0*  INR 2.09  --   --  2.55 2.73  CREATININE 0.94  --  0.88  --   --     Estimated Creatinine Clearance: 69.1 mL/min (by C-G formula based on Cr of 0.88).  Assessment: Pharmacy consulted to manage warfarin therapy in this 74 year old female admitted for diverticulitis being treated with cipro and flagyl  Patient taking warfarin 4mg  daily at home  6/23  INR: 2.55, therapeutic   6/24  INR: 2.73   Goal of Therapy:  INR 2-3   Plan:  Will continue home regimen of warfarin 4mg  daily at this time. Will check daily INR while on antimicrobials, per policy.  Pharmacy to follow per consult.  Curt Jews, [facility] Clinical Pharmacist  12/14/2014,7:03 AM

## 2014-12-14 NOTE — Consult Note (Signed)
Subjective: Patient seen for diverticulitis. Patient with episode of fibrillation/RVR this morning. Was transferred to the CCU. Currently rate controlled adn transferred back to floor.  Les abdominal pain, not passing flatus or stool.  Abdominal pain 2/10.    Objective: Vital signs in last 24 hours: Temp:  [97.2 F (36.2 C)-99.6 F (37.6 C)] 99.5 F (37.5 C) (06/24 1558) Pulse Rate:  [78-164] 92 (06/24 1558) Resp:  [17-43] 20 (06/24 1558) BP: (111-140)/(48-80) 123/63 mmHg (06/24 1558) SpO2:  [89 %-97 %] 93 % (06/24 1558) Blood pressure 123/63, pulse 92, temperature 99.5 F (37.5 C), temperature source Oral, resp. rate 20, height 5\' 5"  (1.651 m), weight 104.101 kg (229 lb 8 oz), SpO2 93 %.   Intake/Output from previous day: 06/23 0701 - 06/24 0700 In: 2220 [P.O.:1920; IV Piggyback:300] Out: 250 [Urine:250]  Intake/Output this shift: Total I/O In: 360 [P.O.:360] Out: 400 [Urine:400]   General appearance:  74 yo nad Resp:  CTA Cardio: RRR GI:  Soft, mild discomfort to palpation in the llq Extremities:  No CCE   Lab Results: Results for orders placed or performed during the hospital encounter of 12/11/14 (from the past 24 hour(s))  Glucose, capillary     Status: Abnormal   Collection Time: 12/14/14  4:25 AM  Result Value Ref Range   Glucose-Capillary 142 (H) 65 - 99 mg/dL  Protime-INR     Status: Abnormal   Collection Time: 12/14/14  5:57 AM  Result Value Ref Range   Prothrombin Time 29.0 (H) 11.4 - 15.0 seconds   INR 2.73       Recent Labs  12/12/14 0206 12/12/14 0408  WBC 10.0 10.8  HGB 14.2 14.6  HCT 42.9 44.0  PLT 198 184   BMET  Recent Labs  12/12/14 0005 12/12/14 0408  NA 138 139  K 3.9 3.7  CL 103 101  CO2 27 31  GLUCOSE 133* 119*  BUN 17 16  CREATININE 0.94 0.88  CALCIUM 9.9 9.4   LFT No results for input(s): PROT, ALBUMIN, AST, ALT, ALKPHOS, BILITOT, BILIDIR, IBILI in the last 72 hours. PT/INR  Recent Labs  12/13/14 0425  12/14/14 0557  LABPROT 27.5* 29.0*  INR 2.55 2.73   Hepatitis Panel No results for input(s): HEPBSAG, HCVAB, HEPAIGM, HEPBIGM in the last 72 hours. C-Diff No results for input(s): CDIFFTOX in the last 72 hours. No results for input(s): CDIFFPCR in the last 72 hours.   Studies/Results: No results found.  Scheduled Inpatient Medications:   . ciprofloxacin  500 mg Oral BID  . diltiazem  300 mg Oral Daily  . ezetimibe  10 mg Oral Daily  . hydrochlorothiazide  25 mg Oral Daily  . loratadine  10 mg Oral Daily  . metroNIDAZOLE  500 mg Oral 3 times per day  . mometasone-formoterol  2 puff Inhalation BID  . montelukast  10 mg Oral QHS  . sodium chloride  3 mL Intravenous Q12H  . theophylline  300 mg Oral BID  . warfarin  4 mg Oral q1800    Continuous Inpatient Infusions:   . diltiazem (CARDIZEM) infusion Stopped (12/14/14 1115)    PRN Inpatient Medications:  acetaminophen **OR** acetaminophen, albuterol, HYDROcodone-acetaminophen, ondansetron **OR** ondansetron (ZOFRAN) IV  Miscellaneous:   Assessment: 1. Diverticulitis, improving, currently on po abx  Plan:  1) continue current, 10-14 day course.  Will check abd  Films.  If still constipated tomorrow, would do tap water enema, and add single dose miralax daily. Dr Vira Agar rounding over the weekend.  Lollie Sails MD 12/14/2014, 4:02 PM

## 2014-12-15 LAB — PROTIME-INR
INR: 3.37
PROTHROMBIN TIME: 34.1 s — AB (ref 11.4–15.0)

## 2014-12-15 MED ORDER — LORATADINE 10 MG PO TABS: 10.0000 mg | ORAL_TABLET | Freq: Every day | ORAL | Status: DC

## 2014-12-15 MED ORDER — CYCLOBENZAPRINE HCL 10 MG PO TABS: 10.0000 mg | ORAL_TABLET | Freq: Three times a day (TID) | ORAL | Status: DC | PRN

## 2014-12-15 MED ORDER — ENSURE ENLIVE PO LIQD
237.0000 mL | Freq: Two times a day (BID) | ORAL | Status: DC
  Administered 2014-12-15 – 2014-12-17 (×6): 237 mL via ORAL

## 2014-12-15 MED ORDER — TRAMADOL HCL 50 MG PO TABS
50.0000 mg | ORAL_TABLET | Freq: Four times a day (QID) | ORAL | Status: DC | PRN
  Administered 2014-12-15: 50 mg via ORAL
  Filled 2014-12-15: qty 1

## 2014-12-15 MED ORDER — POLYETHYLENE GLYCOL 3350 17 G PO PACK
17.0000 g | PACK | Freq: Every day | ORAL | Status: DC
  Administered 2014-12-15 – 2014-12-16 (×2): 17 g via ORAL
  Filled 2014-12-15 (×3): qty 1

## 2014-12-15 MED ORDER — GLYCERIN (LAXATIVE) 2 G RE SUPP
1.0000 | Freq: Once | RECTAL | Status: AC
  Administered 2014-12-15: 1 via RECTAL
  Filled 2014-12-15: qty 1

## 2014-12-15 MED ORDER — ADULT MULTIVITAMIN W/MINERALS CH
1.0000 | ORAL_TABLET | Freq: Every day | ORAL | Status: DC
  Administered 2014-12-15 – 2014-12-20 (×7): 1 via ORAL
  Filled 2014-12-15 (×5): qty 1

## 2014-12-15 MED ORDER — FISH OIL 1200 MG PO CAPS: 1200.0000 mg | ORAL_CAPSULE | Freq: Every day | ORAL | Status: DC

## 2014-12-15 MED ORDER — ALBUTEROL SULFATE (2.5 MG/3ML) 0.083% IN NEBU: 2.5000 mg | INHALATION_SOLUTION | Freq: Four times a day (QID) | RESPIRATORY_TRACT | Status: DC | PRN

## 2014-12-15 MED ORDER — TRAMADOL HCL 50 MG PO TABS: 50.0000 mg | ORAL_TABLET | Freq: Four times a day (QID) | ORAL | Status: DC | PRN

## 2014-12-15 MED ORDER — OCUVITE-LUTEIN PO CAPS
ORAL_CAPSULE | Freq: Every day | ORAL | Status: DC
  Administered 2014-12-15 – 2014-12-19 (×3): via ORAL
  Filled 2014-12-15 (×6): qty 1

## 2014-12-15 MED ORDER — OMEGA-3-ACID ETHYL ESTERS 1 G PO CAPS
1.0000 g | ORAL_CAPSULE | Freq: Every day | ORAL | Status: DC
  Administered 2014-12-15 – 2014-12-20 (×6): 1 g via ORAL
  Filled 2014-12-15 (×6): qty 1

## 2014-12-15 MED ORDER — TRAMADOL-ACETAMINOPHEN 37.5-325 MG PO TABS: 1.0000 | ORAL_TABLET | Freq: Four times a day (QID) | ORAL | Status: DC | PRN

## 2014-12-15 MED ORDER — MAGNESIUM OXIDE 400 MG PO TABS
400.0000 mg | ORAL_TABLET | Freq: Every day | ORAL | Status: DC
  Administered 2014-12-15 – 2014-12-20 (×6): 400 mg via ORAL
  Filled 2014-12-15 (×12): qty 1

## 2014-12-15 MED ORDER — EZETIMIBE 10 MG PO TABS
10.0000 mg | ORAL_TABLET | Freq: Every day | ORAL | Status: DC
  Administered 2014-12-15 – 2014-12-19 (×5): 10 mg via ORAL
  Filled 2014-12-15 (×5): qty 1

## 2014-12-15 MED ORDER — WARFARIN - PHARMACIST DOSING INPATIENT: Freq: Every day | Status: DC

## 2014-12-15 MED ORDER — VITAMIN B-12 100 MCG PO TABS
1000.0000 ug | ORAL_TABLET | Freq: Every day | ORAL | Status: DC
  Administered 2014-12-15 – 2014-12-20 (×5): 1000 ug via ORAL
  Filled 2014-12-15 (×2): qty 1
  Filled 2014-12-15: qty 10
  Filled 2014-12-15 (×2): qty 1

## 2014-12-15 NOTE — Progress Notes (Signed)
[facility] [place] NAME: Laura Gonzalez    MR#:  [id number]  DATE OF BIRTH:  29-Jul-1940  SUBJECTIVE:  Some bilateral Lower abd pain. Wants ensure. sob  REVIEW OF SYSTEMS:   Review of Systems  Constitutional: Negative for fever, chills and weight loss.  HENT: Negative for ear discharge, ear pain and nosebleeds.   Eyes: Negative for blurred vision, pain and discharge.  Respiratory: Positive for shortness of breath. Negative for sputum production, wheezing and stridor.   Cardiovascular: Negative for chest pain, palpitations, orthopnea and PND.  Gastrointestinal: Positive for nausea and abdominal pain. Negative for vomiting and diarrhea.  Genitourinary: Negative for urgency and frequency.  Musculoskeletal: Negative for back pain and joint pain.  Neurological: Positive for weakness. Negative for sensory change, speech change and focal weakness.  Psychiatric/Behavioral: Negative for depression. The patient is not nervous/anxious.   All other systems reviewed and are negative.  Tolerating Diet:some Tolerating PT: rec rehab  DRUG ALLERGIES:   Allergies  Allergen Reactions  . Amoxicillin Other (See Comments)    Lips swelling, tingling  . Benzocaine-Menthol     Throat swelling  . Biafine [Wound Dressings] Swelling  . Chloraseptic Sore Throat [Acetaminophen] Other (See Comments)    throat swelling  . Fosamax [Alendronate Sodium]   . Neosporin [Neomycin-Bacitracin Zn-Polymyx] Other (See Comments)    Skin redness, puffy  . Other     "chlorotrimitron"-- rxn: throat swelling  . Oxycodone Other (See Comments)    Dizziness, lips tingling  . Statins Other (See Comments)    Muscle weakness, weak  . Tape Other (See Comments)    Skin redness    VITALS:  Blood pressure 119/59, pulse 119, temperature 97.7 F (36.5 C), temperature source Oral, resp. rate 17, height 5\' 5"  (1.651 m), weight 104.01 kg (229 lb 4.8 oz), SpO2 89 %.  PHYSICAL  EXAMINATION:   Physical Exam  GENERAL:  74 y.o.-year-old patient lying in the bed with no acute distress. obese EYES: Pupils equal, round, reactive to light and accommodation. No scleral icterus. Extraocular muscles intact.  HEENT: Head atraumatic, normocephalic. Oropharynx and nasopharynx clear.  NECK:  Supple, no jugular venous distention. No thyroid enlargement, no tenderness.  LUNGS: Normal breath sounds bilaterally, no wheezing, rales, rhonchi. No use of accessory muscles of respiration.  CARDIOVASCULAR: S1, S2 normal. No murmurs, rubs, or gallops.  ABDOMEN: Soft, nontender, nondistended. Bowel sounds present. No organomegaly or mass.  EXTREMITIES: No cyanosis, clubbing or edema b/l.    NEUROLOGIC: Cranial nerves II through XII are intact. No focal Motor or sensory deficits b/l.   PSYCHIATRIC: The patient is alert and oriented x 3.  SKIN: No obvious rash, lesion, or ulcer.    LABORATORY PANEL:   CBC  Recent Labs Lab 12/12/14 0408  WBC 10.8  HGB 14.6  HCT 44.0  PLT 184    Chemistries   Recent Labs Lab 12/10/14 1942  12/12/14 0408  NA 141  < > 139  K 4.2  < > 3.7  CL 100*  < > 101  CO2 31  < > 31  GLUCOSE 95  < > 119*  BUN 22*  < > 16  CREATININE 0.77  < > 0.88  CALCIUM 10.2  < > 9.4  AST 20  --   --   ALT 15  --   --   ALKPHOS 89  --   --   BILITOT 0.5  --   --   < > =  values in this interval not displayed.  Cardiac Enzymes No results for input(s): TROPONINI in the last 168 hours.  RADIOLOGY:  Dg Abd 2 Views  12/14/2014   CLINICAL DATA:  Patient admitted Tuesday for diverticulitis of the large intestine. History of umbilical hernia repairs in 1999 and 2001.  EXAM: ABDOMEN - 2 VIEW  COMPARISON:  12/11/2014 and CT of the abdomen and pelvis on 12/10/2014  FINDINGS: At the right lung base there is a rounded opacity not seen on the previous exams and possibly representing something external to the patient measuring 2.3 cm. There are bilateral pleural effusions.  There is no free intraperitoneal air.  Supine and erect views of the abdomen demonstrate residual contrast following CT exam on 06/20. There is mild gaseous distension of colonic loops. No evidence for small bowel obstruction. Multiple surgical clips are identified in the pelvis.  IMPRESSION: 1. Probable artifact external to the patient overlying the right lung base. 2. Residual contrast in colonic loops. 3. Nonobstructive bowel gas pattern.   Electronically Signed   By: Nolon Nations M.D.   On: 12/14/2014 18:37     ASSESSMENT AND PLAN:   * Sepsis - present on admission likely secondary to diverticulitis. Switch to PO antibiotics, blood cultures neg so far.  * Diverticulitis - failed outpatient therapy due to unable to tolerate oral antibiotics, we will have her on antiemetics, and change to PO antibiotics as her IV is infiltrated.  * Rapid A-fib - Acute on chronic, on chronic anticoagulation for this with warfarin. Pharmacy managing anticoagulation. Continue home meds for rate control. Required cardizem drip overnight, but rate is better controlled   * HTN (hypertension) - continue home meds  * GERD (gastroesophageal reflux disease) - continue home dose PPI  * Asthma - continue home inhalers-sats 91-94% ON 4 LITER. CXR no infiltrate or edema  * constipation: on bowel regimen, needs BM. abd films ok Tap water enema today, prn miralax   Case discussed with Care Management/Social Worker. For Rehab when bed avialable Management plans discussed with the patient, family and they are in agreement.  CODE STATUS: full  DVT Prophylaxis: warfarin  TOTAL TIME TAKING CARE OF THIS PATIENT: 25inutes.  >50% time spent on counselling and coordination of care  POSSIBLE D/C IN 1-2YS, DEPENDING ON CLINICAL CONDITION.   Charls Custer M.D on 12/15/2014 at 5:25 PM  Between 7am to 6pm - Pager - [phone]  After 6pm go to [email or web address] - password [email or web address] [facility] [facility]  Office   [phone]  CC: Primary care physician; [name], MD

## 2014-12-15 NOTE — Progress Notes (Signed)
Pt is refusing to wear CPAP. Pt encouraged to wear CPAP but continues to refuse. Pt encouraged to bring home CPAP unit so she has the benefit of humidity.

## 2014-12-15 NOTE — Plan of Care (Signed)
Problem: Acute Rehab PT Goals(only PT should resolve) Goal: Patient Will Transfer Sit To/From Stand Pt will transfer sit to/from-stand with  LRAD at supervision without loss-of-balance to demonstrate good safety awareness for independent mobility in home.     Goal: Pt Will Ambulate Pt will ambulate with LRAD at Supervision using a step-through pattern and equal step length for a distances greater than 220ft maintaining SaO2 >91% to demonstrate the ability to perform safe household distance ambulation at discharge.    Goal: Pt Will Go Up/Down Stairs Pt will ascend/descend 1 step with LRAD and 1 HR at Supervision to demonstrate safe entry/exit of home.

## 2014-12-15 NOTE — Progress Notes (Signed)
GI Inpatient Follow-up Note  Patient Identification: Laura Gonzalez is a 74 y.o. female with diverticulits  Subjective: Ms. Salsberry reports that she was given an enema this morning at 0830 and had some relief, but still feels constipated.  She had an KUB yesterday that showed no SBO. She was offered a soft diet today, but the smell made her nauseas. She was brought an Ensure and is tolerating it.  She denies any vomiting.  She reports that her LLQ abd pain is now at 2 or 3/10, still only hurts when she moves and she feels like she is improving.  She is concerned that the tramadol she is taking for pain will cause further constipation.  Scheduled Inpatient Medications:  . ciprofloxacin  500 mg Oral BID  . diltiazem  300 mg Oral Daily  . ezetimibe  10 mg Oral QHS  . feeding supplement (ENSURE ENLIVE)  237 mL Oral BID BM  . hydrochlorothiazide  25 mg Oral Daily  . loratadine  10 mg Oral Daily  . magnesium oxide  400 mg Oral Daily  . metroNIDAZOLE  500 mg Oral 3 times per day  . mometasone-formoterol  2 puff Inhalation BID  . montelukast  10 mg Oral QHS  . multivitamin with minerals  1 tablet Oral Daily  . multivitamin-lutein   Oral Daily  . omega-3 acid ethyl esters  1 g Oral Daily  . polyethylene glycol  17 g Oral Daily  . sodium chloride  3 mL Intravenous Q12H  . theophylline  300 mg Oral BID  . vitamin B-12  1,000 mcg Oral Daily  . Warfarin - Pharmacist Dosing Inpatient   Does not apply q1800    Continuous Inpatient Infusions:     PRN Inpatient Medications:  acetaminophen **OR** acetaminophen, albuterol, bisacodyl, cyclobenzaprine, docusate sodium, HYDROcodone-acetaminophen, ondansetron **OR** ondansetron (ZOFRAN) IV, traMADol  Review of Systems: Constitutional: Weight is stable.  Eyes: No changes in vision. ENT: No oral lesions, sore throat.  GI: see HPI.  Heme/Lymph: No easy bruising.  CV: No chest pain.  GU: No hematuria.  Integumentary: No rashes.  Neuro: No headaches.   Psych: No depression/anxiety.  Endocrine: No heat/cold intolerance.  Allergic/Immunologic: No urticaria.  Resp: No cough, SOB.  Musculoskeletal: No joint swelling.    Physical Examination: BP 119/59 mmHg  Pulse 119  Temp(Src) 97.7 F (36.5 C) (Oral)  Resp 17  Ht 5\' 5"  (1.651 m)  Wt 104.01 kg (229 lb 4.8 oz)  BMI 38.16 kg/m2  SpO2 89% Gen: NAD, alert and oriented x 4,  Patient sitting in chair during exam. HEENT: PEERLA, EOMI, Neck: supple, no JVD or thyromegaly Chest: CTA bilaterally, no wheezes, crackles, or other adventitious sounds CV: RRR, no m/g/c/r Abd: soft, tender in LLQ,  ND, +BS in all four quadrants; no HSM, guarding, ridigity, or rebound tenderness Ext: no edema, well perfused with 2+ pulses, Skin: no rash or lesions noted Lymph: no LAD  Data: Lab Results  Component Value Date   WBC 10.8 12/12/2014   HGB 14.6 12/12/2014   HCT 44.0 12/12/2014   MCV 92.6 12/12/2014   PLT 184 12/12/2014    Recent Labs Lab 12/10/14 1942 12/12/14 0206 12/12/14 0408  HGB 15.3 14.2 14.6   Lab Results  Component Value Date   NA 139 12/12/2014   K 3.7 12/12/2014   CL 101 12/12/2014   CO2 31 12/12/2014   BUN 16 12/12/2014   CREATININE 0.88 12/12/2014   Lab Results  Component Value Date  ALT 15 12/10/2014   AST 20 12/10/2014   ALKPHOS 89 12/10/2014   BILITOT 0.5 12/10/2014    Recent Labs Lab 12/15/14 0506  INR 3.37   Assessment/Plan: Ms. Faley is a 74 y.o. female with resolving diverticulits  Recommendations: We agree to continue the dulcolax, colace, mag-ox and miralax.  We will change the tap water enema to a glycerin suppository. We will continue to follow with you. Please call with questions or concerns.  Salvadore Farber, PA-C  I personally performed these services.

## 2014-12-15 NOTE — Progress Notes (Signed)
ANTICOAGULATION CONSULT NOTE - Initial Consult  Pharmacy Consult for Warfarin Indication: atrial fibrillation    Labs:  Recent Labs  12/13/14 0425 12/14/14 0557 12/15/14 0506  LABPROT 27.5* 29.0* 34.1*  INR 2.55 2.73 3.37    Estimated Creatinine Clearance: 68.5 mL/min (by C-G formula based on Cr of 0.88).  Assessment: Pharmacy consulted to manage warfarin therapy in this 74 year old female admitted for diverticulitis being treated with cipro and flagyl  Patient taking warfarin 4mg  daily at home  6/23  INR: 2.55,  6/24  INR: 2.73,  6/25  INR: 3.37   Goal of Therapy:  INR 2-3   Plan:  Will discontinue warfarin 4mg  daily at this time. Will check daily INR while on antimicrobials and reassess dosing then.  Pharmacy to follow per consult.  Curt Jews, [facility] Clinical Pharmacist  12/15/2014,5:57 AM

## 2014-12-15 NOTE — Progress Notes (Signed)
Informed Dr. Posey Pronto of pt's oxygen status while on 4 liters.

## 2014-12-15 NOTE — Evaluation (Signed)
Physical Therapy Evaluation Patient Details Name: Laura Gonzalez MRN: [id number] DOB: 1940-11-17 Today's Date: 12/15/2014   History of Present Illness  Laura Gonzalez is a 74 y.o. female who presents with diverticulitis. Patient came to the ED yesterday with several days complaint of left lower quadrant pain and was found on imaging to have diverticulitis. She was sent home with oral antibiotics. She returned today after spiking several fevers, having shaking chills, and nausea and vomiting to the point that she was not able to keep down her by mouth medications. She was tachycardic on arrival here. Hospitalists were called for admission for sepsis secondary to her diverticulitis, and for diverticulitis having failed outpatient therapy.  Clinical Impression  Pt is up in chair upon entry, awake, alert, and willing to participate. No acute distress noted. Pt is A&Ox3 and pleasant, as well as a good historian. Pt reports zero falls in the last 6 months. Pt strength as screened during functional mobility assessment is generally weak. Pt falls risk is high as evidenced by slow gait speed and poor forward reach. Pt remaining on 4L O2 throughout evaluation, but desaturating with activity (91%) as well as mobility (89%), whereas pt is not on O2 at baseline at home.Pt reports having used CPAP at night for approx 8 years. Patient presenting with impairment of strength, range of motion, balance, oxygen perfusion, and activity tolerance, limiting ability to perform ADL and mobility tasks at  baseline level of function. Patient will benefit from skilled intervention to address the above impairments and limitations, in order to restore to prior level of function, improve patient safety upon discharge, and to decrease falls risk.       Follow Up Recommendations SNF    Equipment Recommendations  Rolling walker with 5" wheels    Recommendations for Other Services       Precautions / Restrictions  Precautions Precautions: Fall Restrictions Weight Bearing Restrictions: No      Mobility  Bed Mobility Overal bed mobility:  (received in chair.)                Transfers Overall transfer level: Needs assistance Equipment used: None Transfers: Sit to/from Stand Sit to Stand: Min guard            Ambulation/Gait Ambulation/Gait assistance: Min guard Ambulation Distance (Feet): 50 Feet Assistive device: 1 person hand held assist Gait Pattern/deviations: Antalgic Gait velocity: Pt unsteady on fet, c/o lightheadedness unrelenting.  Gait velocity interpretation: <1.8 ft/sec, indicative of risk for recurrent falls General Gait Details: HR stable around 120bpm, and SaO2 stable between 89-91% on 4L.   Stairs            Wheelchair Mobility    Modified Rankin (Stroke Patients Only)       Balance Overall balance assessment: Needs assistance Sitting-balance support: No upper extremity supported;Feet supported Sitting balance-Leahy Scale: Normal         Standing balance comment: Pt reports dizziness and Nausea with standing/AMB unrelenting.                              Pertinent Vitals/Pain Pain Assessment: 0-10 Pain Score: 4  Pain Location: posterior lower ribs Pain Intervention(s): Limited activity within patient's tolerance;Monitored during session    Laura Gonzalez to:: Private residence Living Arrangements: Alone Available Help at Discharge: Friend(s);Neighbor Type of Home: House Home Access: Stairs to enter   [employer] of Steps: 1 Home Layout:  One level Home Equipment: Cane - single point;Walker - 2 wheels      Prior Function Level of Independence: Independent               Hand Dominance        Extremity/Trunk Assessment   Upper Extremity Assessment: Generalized weakness           Lower Extremity Assessment: Generalized weakness (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 )      Cervical / Trunk Assessment: Kyphotic (forward flexed. )  Communication   Communication: No difficulties  Cognition Arousal/Alertness: Awake/alert Behavior During Therapy: WFL for tasks assessed/performed Overall Cognitive Status: Within Functional Limits for tasks assessed                      General Comments      Exercises        Assessment/Plan    PT Assessment Patient needs continued PT services  PT Diagnosis Difficulty walking;Generalized weakness;Abnormality of gait   PT Problem List Decreased strength;Decreased range of motion;Obesity;Decreased activity tolerance;Decreased balance;Pain;Cardiopulmonary status limiting activity;Decreased mobility  PT Treatment Interventions Balance training;Gait training;Functional mobility training;Therapeutic activities;Therapeutic exercise   PT Goals (Current goals can be found in the Care Plan section) Acute Rehab PT Goals Patient Stated Goal: Return to PLOF in ADL, IADL. PT Goal Formulation: With patient Time For Goal Achievement: 12/29/14 Potential to Achieve Goals: Good    Frequency Min 2X/week   Barriers to discharge Decreased caregiver support;Inaccessible home environment      Co-evaluation               End of Session   Activity Tolerance: Patient limited by fatigue (limited by nausea. ) Patient left: in chair;with nursing/sitter in room Nurse Communication: Mobility status         Time: 6979-4801 PT Time Calculation (min) (ACUTE ONLY): 20 min   Charges:   PT Evaluation $Initial PT Evaluation Tier I: 1 Procedure     PT G Codes:        Zuria Fosdick C Jan 06, 2015, 1:44 PM  1:48 PM  Etta Grandchild, PT, DPT [place] License # [id number]

## 2014-12-16 ENCOUNTER — Inpatient Hospital Stay: Payer: Medicare Other

## 2014-12-16 ENCOUNTER — Inpatient Hospital Stay
Admit: 2014-12-16 | Discharge: 2014-12-16 | Disposition: A | Discharge status: Home / Self Care | Payer: Medicare Other | Attending: Cardiology | Admitting: Cardiology

## 2014-12-16 LAB — BRAIN NATRIURETIC PEPTIDE: B Natriuretic Peptide: 72 pg/mL (ref 0.0–100.0)

## 2014-12-16 LAB — PROTIME-INR
INR: 3.16
Prothrombin Time: 32.5 seconds — ABNORMAL HIGH (ref 11.4–15.0)

## 2014-12-16 MED ORDER — DILTIAZEM HCL 25 MG/5ML IV SOLN
10.0000 mg | Freq: Once | INTRAVENOUS | Status: AC
  Administered 2014-12-16: 10 mg via INTRAVENOUS
  Filled 2014-12-16: qty 5

## 2014-12-16 MED ORDER — IOHEXOL 350 MG/ML SOLN
100.0000 mL | Freq: Once | INTRAVENOUS | Status: AC | PRN
  Administered 2014-12-16: 100 mL via INTRAVENOUS

## 2014-12-16 MED ORDER — IPRATROPIUM-ALBUTEROL 0.5-2.5 (3) MG/3ML IN SOLN
3.0000 mL | RESPIRATORY_TRACT | Status: DC
  Administered 2014-12-16 – 2014-12-19 (×21): 3 mL via RESPIRATORY_TRACT
  Filled 2014-12-16 (×22): qty 3

## 2014-12-16 MED ORDER — FUROSEMIDE 10 MG/ML IJ SOLN
40.0000 mg | Freq: Once | INTRAMUSCULAR | Status: AC
  Administered 2014-12-16: 40 mg via INTRAVENOUS
  Filled 2014-12-16: qty 4

## 2014-12-16 MED ORDER — BUDESONIDE 0.5 MG/2ML IN SUSP
0.5000 mg | Freq: Two times a day (BID) | RESPIRATORY_TRACT | Status: DC
Start: 2014-12-16 — End: 2014-12-20
  Administered 2014-12-16 – 2014-12-20 (×8): 0.5 mg via RESPIRATORY_TRACT
  Filled 2014-12-16 (×8): qty 2

## 2014-12-16 MED ORDER — DILTIAZEM HCL ER COATED BEADS 180 MG PO CP24
300.0000 mg | ORAL_CAPSULE | Freq: Every day | ORAL | Status: DC
  Administered 2014-12-17 – 2014-12-20 (×4): 300 mg via ORAL
  Filled 2014-12-16 (×6): qty 1

## 2014-12-16 MED ORDER — DILTIAZEM HCL 100 MG IV SOLR
5.0000 mg/h | INTRAVENOUS | Status: DC
  Filled 2014-12-16: qty 100

## 2014-12-16 MED ORDER — SOTALOL HCL 80 MG PO TABS
80.0000 mg | ORAL_TABLET | Freq: Two times a day (BID) | ORAL | Status: DC
  Administered 2014-12-16 – 2014-12-20 (×9): 80 mg via ORAL
  Filled 2014-12-16 (×10): qty 1

## 2014-12-16 NOTE — Progress Notes (Signed)
[facility] [place] NAME: Laura Gonzalez    MR#:  [id number]  DATE OF BIRTH:  19-Oct-1940  SUBJECTIVE:  Martin Majestic in to Rapid afib again last nite. recvd cardizem po and IV push x2. HR 120-160's. SOB on 3 liter [place] Per RT refused to wear CPAP Tolerates ensure  REVIEW OF SYSTEMS:   Review of Systems  Constitutional: Negative for fever, chills and weight loss.  HENT: Negative for ear discharge, ear pain and nosebleeds.   Eyes: Negative for blurred vision, pain and discharge.  Respiratory: Positive for shortness of breath. Negative for sputum production, wheezing and stridor.   Cardiovascular: Positive for palpitations. Negative for chest pain, orthopnea and PND.  Gastrointestinal: Positive for nausea and abdominal pain. Negative for vomiting and diarrhea.  Genitourinary: Negative for urgency and frequency.  Musculoskeletal: Negative for back pain and joint pain.  Neurological: Positive for weakness. Negative for sensory change, speech change and focal weakness.  Psychiatric/Behavioral: Negative for depression. The patient is not nervous/anxious.   All other systems reviewed and are negative.  Tolerating Diet:some Tolerating PT: rec rehab  DRUG ALLERGIES:   Allergies  Allergen Reactions  . Amoxicillin Other (See Comments)    Lips swelling, tingling  . Benzocaine-Menthol     Throat swelling  . Biafine [Wound Dressings] Swelling  . Chloraseptic Sore Throat [Acetaminophen] Other (See Comments)    throat swelling  . Fosamax [Alendronate Sodium]   . Neosporin [Neomycin-Bacitracin Zn-Polymyx] Other (See Comments)    Skin redness, puffy  . Other     "chlorotrimitron"-- rxn: throat swelling  . Oxycodone Other (See Comments)    Dizziness, lips tingling  . Statins Other (See Comments)    Muscle weakness, weak  . Tape Other (See Comments)    Skin redness    VITALS:  Blood pressure 116/68, pulse 163, temperature 98.7 F (37.1 C),  temperature source Oral, resp. rate 20, height 5\' 5"  (1.651 m), weight 102.286 kg (225 lb 8 oz), SpO2 90 %.  PHYSICAL EXAMINATION:   Physical Exam  GENERAL:  74 y.o.-year-old patient lying in the bed with mild to mod distress due to sob obese EYES: Pupils equal, round, reactive to light and accommodation. No scleral icterus. Extraocular muscles intact.  HEENT: Head atraumatic, normocephalic. Oropharynx and nasopharynx clear.  NECK:  Supple, no jugular venous distention. No thyroid enlargement, no tenderness.  LUNGS: decreased breath sounds bilaterally, no wheezing, rales, rhonchi. No use of accessory muscles of respiration.  CARDIOVASCULAR: S1, S2 normal. Irregularly irregular HR No murmurs, rubs, or gallops.  ABDOMEN: Soft, nontender, nondistended. Bowel sounds present. No organomegaly or mass.  EXTREMITIES: No cyanosis, clubbing or edema b/l.    NEUROLOGIC: Cranial nerves II through XII are intact. No focal motor or sensory deficits b/l.   PSYCHIATRIC: The patient is alert and oriented x 3.  SKIN: No obvious rash, lesion, or ulcer.    LABORATORY PANEL:   CBC  Recent Labs Lab 12/12/14 0408  WBC 10.8  HGB 14.6  HCT 44.0  PLT 184    Chemistries   Recent Labs Lab 12/10/14 1942  12/12/14 0408  NA 141  < > 139  K 4.2  < > 3.7  CL 100*  < > 101  CO2 31  < > 31  GLUCOSE 95  < > 119*  BUN 22*  < > 16  CREATININE 0.77  < > 0.88  CALCIUM 10.2  < > 9.4  AST 20  --   --  ALT 15  --   --   ALKPHOS 89  --   --   BILITOT 0.5  --   --   < > = values in this interval not displayed.  Cardiac Enzymes No results for input(s): TROPONINI in the last 168 hours.  RADIOLOGY:  Dg Abd 2 Views  12/14/2014   CLINICAL DATA:  Patient admitted Tuesday for diverticulitis of the large intestine. History of umbilical hernia repairs in 1999 and 2001.  EXAM: ABDOMEN - 2 VIEW  COMPARISON:  12/11/2014 and CT of the abdomen and pelvis on 12/10/2014  FINDINGS: At the right lung base there is a  rounded opacity not seen on the previous exams and possibly representing something external to the patient measuring 2.3 cm. There are bilateral pleural effusions. There is no free intraperitoneal air.  Supine and erect views of the abdomen demonstrate residual contrast following CT exam on 06/20. There is mild gaseous distension of colonic loops. No evidence for small bowel obstruction. Multiple surgical clips are identified in the pelvis.  IMPRESSION: 1. Probable artifact external to the patient overlying the right lung base. 2. Residual contrast in colonic loops. 3. Nonobstructive bowel gas pattern.   Electronically Signed   By: Nolon Nations M.D.   On: 12/14/2014 18:37     ASSESSMENT AND PLAN:  * Rapid A-fib - Acute on chronic, on chronic anticoagulation for this with warfarin. Pharmacy managing anticoagulation.  -pt went back in rapid afib last nite requiring po and IV push cardizem x2 -still HR 125-160 -will transfer to CCU for IV cardizem gtt -Cardiology consult with Dr Josefa Half -Echo today -Repeat CXR ?CHF -on warfarin with therapeutic INR  * Sepsis - present on admission likely secondary to diverticulitis. Switch to PO antibiotics, blood cultures neg so far.  * Diverticulitis - failed outpatient therapy due to unable to tolerate oral antibiotics, we will have her on antiemetics, and change to PO antibiotics as her IV is infiltrated.  * HTN (hypertension) - continue home meds  * GERD (gastroesophageal reflux disease) - continue home dose PPI  * Asthma - continue home inhalers-sats 91-94% ON 4 LITER. CXR no infiltrate or edema  * constipation: on bowel regimen, needs BM. abd films ok -had BM y'day after enema - prn miralax  *gen weakness -PT recommneds rehab. Pt agreeable  Very slow improvement. Cont ensure. Pt advised to eat a bit more today   Case discussed with Education officer, museum. For Rehab when bed avialable Management plans discussed with the patient, family and they  are in agreement.  CODE STATUS: full  DVT Prophylaxis: warfarin  TOTAL CRITICALTIME TAKING CARE OF THIS PATIENT: 40 mins >50% time spent on counselling and coordination of care   Kingsly Kloepfer M.D on 12/16/2014 at 7:51 AM  Between 7am to 6pm - Pager - [phone]  After 6pm go to [email or web address] - password [email or web address] [facility]  Office  [phone]  CC: Primary care physician; [name], MD

## 2014-12-16 NOTE — Progress Notes (Signed)
CXR from today shows hypoinflation ewith possible right LL infitrate vs pulm edema Afebrile. Already on abxs -check BNP -ECHO -Lasix 40 mg IV once -waiting for pulmonary consult -dr Josefa Half to start po sotalol for rate control Seems pt going into CHF

## 2014-12-16 NOTE — Clinical Social Work Note (Signed)
Clinical Social Work Assessment  Patient Details  Name: Laura Gonzalez MRN: [id number] Date of Birth: Oct 02, 1940  Date of referral:  12/16/14               Reason for consult:  Facility Placement                Permission sought to share information with:  Other Permission granted to share information::  No   Housing/Transportation Living arrangements for the past 2 months:  [place] of Information:  Patient Patient Interpreter Needed:  None Criminal Activity/Legal Involvement Pertinent to Current Situation/Hospitalization:  No - Comment as needed Significant Relationships:  Other Family Members Lives with:  Self Do you feel safe going back to the place where you live?  Yes Need for family participation in patient care:  No (Coment)  Care giving concerns:  None identified   Facilities manager / plan:  CSW met with pt address consult. CSW introduced herself and explained role of social work. CSW also explained process of discharging to SNF. PT is recommending SNF. Pt is agreeable as she lives alone.   FL2 is on the chart. SNF search initiated. Will follow up with bed offers.   Employment status:  Retired Forensic scientist:  Medicare PT Recommendations:  [place] / Referral to community resources:  [place]  Patient/Family's Response to care:  Pt was pleasant and agreeable to discharge plan.  Patient/Family's Understanding of and Emotional Response to Diagnosis, Current Treatment, and Prognosis:  Pt understands she needs SNF  Emotional Assessment Appearance:    Attitude/Demeanor/Rapport:  Other (Appropriate) Affect (typically observed):  Accepting Orientation:    Alcohol / Substance use:  Never Used Psych involvement (Current and /or in the community):  No (Comment)  Discharge Needs  Concerns to be addressed:  No discharge needs identified Readmission within the last 30 days:  No Current  discharge risk:  None Barriers to Discharge:  No Barriers Identified   Darden Dates, LCSW 12/16/2014, 5:37 PM

## 2014-12-16 NOTE — Consult Note (Signed)
Pt with back in atrial fib and going back to CCU for iv cardiazem drip.  She still is taking Ensure as food causes her to be nauseated.  She had 2 somewhat loose  bowel movements yesterday and her abdomen feels better.  WBC 10.8, hgb 14.6,. Plt 184.  Recommend magnesium level and Met B with next blood draw.  No new GI recommendations. She refused a CPAP, is SOB on 3L.

## 2014-12-16 NOTE — Consult Note (Signed)
[facility]  CARDIOLOGY CONSULT NOTE  Patient ID: Laura Gonzalez MRN: [id number] DOB/AGE: 1941/03/05 74 y.o.  Admit date: 12/11/2014 Referring Physician Fritzi Mandes MD Primary Physician Vonzella Nipple MD Primary Cardiologist Jordan Hawks M.D. Reason for Consultation atrial fibrillation with a rapid ventricular rate  HPI: 74 year old female with history of paroxysmal atrial fibrillation referred for recurrent atrial fibrillation with a rapid ventricular rate. The patient reports that she was in her usual state of health until 12/10/14 when she is experiencing abdominal discomfort, was evaluated [facility] emergency room, with CT scan revealing evidence for diverticulitis. Patient was started on outpatient anabiotic's, however returned today with fever, chills and nausea and vomiting. The patient has been evaluated by GI. Patient has shown slow progress. She's experienced intermittent episodes of brief atrial fibrillation until earlier today when she had an episode at 160 bpm requiring IV Cardizem bolus with eventual conversion to sinus rhythm. She currently denies chest pain or shortness of breath.  Review of systems complete and found to be negative unless listed above     Past Medical History  Diagnosis Date  . Dysrhythmia     afib  . Pneumonia   . Asthma   . Numbness and tingling     arm to leg  . Arthritis     wrist and knees  . Hernia, umbilical   . Shortness of breath     only with astma attacks  . Sleep apnea   . Cancer     skin cancer  . Osteoporosis   . Hypertension   . GERD (gastroesophageal reflux disease)   . Diverticulosis     Past Surgical History  Procedure Laterality Date  . Tonsillectomy  1960  . Umbilical hernia repair  [id number]  . Wisdom tooth extraction  1963  . Wrist surgery Right 1998    external fixator  . Colonscopy  2000,2007,2012  . Rotator cuff repair Bilateral right- 2009, left 2011  . Skin cancer excision  2009    back of neck and right cheek  . Lipoma  excision Right 2010    back  . Varicose vein surgery Left 04/2009 rt 2011  . Anterior cervical decomp/discectomy fusion N/A 08/09/2012    Procedure: ANTERIOR CERVICAL DECOMPRESSION/DISCECTOMY FUSION 2 LEVELS;  Surgeon: [name], MD;  Location: [place] [facility];  Service: Neurosurgery;  Laterality: N/A;  C3-4 C4-5 Anterior cervical decompression/diskectomy/fusion/LifeNet Bone/Trestle plate    Prescriptions prior to admission  Medication Sig Dispense Refill Last Dose  . Calcium Citrate (CITRACAL PO) Take 1 tablet by mouth 2 (two) times daily.   12/11/2014 at Unknown time  . ciprofloxacin (CIPRO) 500 MG tablet Take 1 tablet (500 mg total) by mouth 2 (two) times daily. 28 tablet 0 12/11/2014 at Unknown time  . diltiazem (CARDIZEM CD) 300 MG 24 hr capsule Take 300 mg by mouth daily.   12/11/2014 at Unknown time  . ezetimibe (ZETIA) 10 MG tablet Take 10 mg by mouth at bedtime.   12/11/2014 at Unknown time  . Fluticasone-Salmeterol (ADVAIR) 100-50 MCG/DOSE AEPB Inhale 1 puff into the lungs every 12 (twelve) hours.   12/11/2014 at Unknown time  . hydrochlorothiazide (HYDRODIURIL) 25 MG tablet Take 25 mg by mouth daily.   12/11/2014 at Unknown time  . magnesium oxide (MAG-OX) 400 MG tablet Take 400 mg by mouth daily.   12/11/2014 at Unknown time  . metroNIDAZOLE (FLAGYL) 500 MG tablet Take 1 tablet (500 mg total) by mouth 3 (three) times daily. 42 tablet 0 12/11/2014 at  Unknown time  . Misc Natural Products (OSTEO BI-FLEX JOINT SHIELD PO) Take 1 tablet by mouth daily.   12/11/2014 at Unknown time  . montelukast (SINGULAIR) 10 MG tablet Take 10 mg by mouth at bedtime.   12/11/2014 at Unknown time  . Multiple Vitamin (MULTIVITAMIN WITH MINERALS) TABS Take 1 tablet by mouth daily.   12/11/2014 at Unknown time  . Multiple Vitamins-Minerals (PRESERVISION AREDS PO) Take 1 tablet by mouth daily.   12/11/2014 at Unknown time  . Omega-3 Fatty Acids (FISH OIL) 1200 MG CAPS Take 1,200 mg by mouth daily.   12/11/2014 at Unknown  time  . OVER THE COUNTER MEDICATION Take 3,000 Units by mouth daily. Vitamin D3   12/11/2014 at Unknown time  . potassium chloride SA (K-DUR,KLOR-CON) 20 MEQ tablet Take 20 mEq by mouth 3 (three) times daily.   12/11/2014 at Unknown time  . theophylline (THEODUR) 300 MG 12 hr tablet Take 300 mg by mouth 2 (two) times daily.   12/11/2014 at Unknown time  . vitamin B-12 (CYANOCOBALAMIN) 1000 MCG tablet Take 1,000 mcg by mouth daily.   12/11/2014 at Unknown time  . warfarin (COUMADIN) 6 MG tablet Take 4 mg by mouth daily at 6 PM.    12/11/2014 at Unknown time  . albuterol (PROVENTIL HFA;VENTOLIN HFA) 108 (90 BASE) MCG/ACT inhaler Inhale 2 puffs into the lungs every 6 (six) hours as needed. For shortness of breath   12/11/2014  . albuterol (PROVENTIL) (2.5 MG/3ML) 0.083% nebulizer solution Take 2.5 mg by nebulization every 6 (six) hours as needed. For shortness of breath   Not Taking at Unknown time  . cyclobenzaprine (FLEXERIL) 10 MG tablet Take 1 tablet (10 mg total) by mouth 3 (three) times daily as needed for muscle spasms. 40 tablet 1 Not Taking at Unknown time  . diltiazem (CARDIZEM) 60 MG tablet Take 120 mg by mouth every 6 (six) hours as needed. Takes if has palpitation after taking diltiazem 300mg  dose   Not Taking at Unknown time  . loratadine (CLARITIN) 10 MG tablet Take 10 mg by mouth daily.   Not Taking at Unknown time  . mometasone (NASONEX) 50 MCG/ACT nasal spray Place 1 spray into the nose daily.   Not Taking at Unknown time  . traMADol (ULTRAM) 50 MG tablet Take 1 tablet (50 mg total) by mouth every 6 (six) hours as needed. 20 tablet 0 Not Taking at Unknown time  . traMADol-acetaminophen (ULTRACET) 37.5-325 MG per tablet Take 1-2 tablets by mouth every 6 (six) hours as needed for pain. 41 tablet 0 Not Taking at Unknown time   History   Social History  . Marital Status: Single    Spouse Name: N/A  . Number of Children: N/A  . Years of Education: N/A   Occupational History  . Not on  file.   Social History Main Topics  . Smoking status: Never Smoker   . Smokeless tobacco: Not on file  . Alcohol Use: No  . Drug Use: No  . Sexual Activity: Not Currently   Other Topics Concern  . Not on file   Social History Narrative    Family History  Problem Relation Age of Onset  . Breast cancer Mother   . Pulmonary embolism Mother   . Arthritis Mother   . Hypertension Mother   . Heart attack Mother   . Stomach cancer Maternal Aunt   . Throat cancer Maternal Uncle   . Stomach cancer Maternal Grandfather  Review of systems complete and found to be negative unless listed above      PHYSICAL EXAM  General: Well developed, well nourished, in no acute distress HEENT:  Normocephalic and atramatic Neck:  No JVD.  Lungs: Clear bilaterally to auscultation and percussion. Heart: HRRR . Normal S1 and S2 without gallops or murmurs.  Abdomen: Bowel sounds are positive, abdomen soft and non-tender  Msk:  Back normal, normal gait. Normal strength and tone for age. Extremities: No clubbing, cyanosis or edema.   Neuro: Alert and oriented X 3. Psych:  Good affect, responds appropriately  Labs:   Lab Results  Component Value Date   WBC 10.8 12/12/2014   HGB 14.6 12/12/2014   HCT 44.0 12/12/2014   MCV 92.6 12/12/2014   PLT 184 12/12/2014    Recent Labs Lab 12/10/14 1942  12/12/14 0408  NA 141  < > 139  K 4.2  < > 3.7  CL 100*  < > 101  CO2 31  < > 31  BUN 22*  < > 16  CREATININE 0.77  < > 0.88  CALCIUM 10.2  < > 9.4  PROT 7.6  --   --   BILITOT 0.5  --   --   ALKPHOS 89  --   --   ALT 15  --   --   AST 20  --   --   GLUCOSE 95  < > 119*  < > = values in this interval not displayed. No results found for: CKTOTAL, CKMB, CKMBINDEX, TROPONINI No results found for: CHOL No results found for: HDL No results found for: LDLCALC No results found for: TRIG No results found for: CHOLHDL No results found for: LDLDIRECT    Radiology: Dg Chest 1  View  12/16/2014   CLINICAL DATA:  Abdominal pain and nausea/diverticulitis. Shortness of breath.  EXAM: CHEST  1 VIEW  COMPARISON:  12/11/2014  FINDINGS: Lungs are moderately hypoinflated with bibasilar opacification suggesting small to moderate bilateral effusions with associated basilar atelectasis. Cannot completely exclude infection in the lung bases. There is increased opacification in the right hilar/perihilar region. Cardiomediastinal silhouette and remainder of the exam is unchanged.  IMPRESSION: Moderate right hilar/ perihilar opacification which may be due to a pneumonia with evidence of small to moderate bibasilar effusions/ atelectasis.   Electronically Signed   By: Marin Olp M.D.   On: 12/16/2014 10:20   Ct Abdomen Pelvis W Contrast  12/10/2014   CLINICAL DATA:  Left lower quadrant pain for 2 days.  EXAM: CT ABDOMEN AND PELVIS WITH CONTRAST  TECHNIQUE: Multidetector CT imaging of the abdomen and pelvis was performed using the standard protocol following bolus administration of intravenous contrast.  CONTRAST:  133mL OMNIPAQUE IOHEXOL 300 MG/ML  SOLN  COMPARISON:  Right upper quadrant ultrasound 03/21/2014  FINDINGS: Lower chest: Linear areas of atelectasis or scarring in the lung bases. Heart is normal size. No effusions.  Hepatobiliary: Gallstones noted within the gallbladder. No focal hepatic abnormality.  Pancreas: No focal abnormality or ductal dilatation.  Spleen: No focal abnormality.  Normal size.  Adrenals/Urinary Tract: No focal renal or adrenal abnormality. No hydronephrosis. Urinary bladder is unremarkable.  Stomach/Bowel: Descending colonic and sigmoid diverticulosis. Inflammatory stranding around the proximal sigmoid colon compatible with active diverticulitis. No free air or extraluminal gas. No focal fluid collections. Stomach and small bowel are decompressed. Appendix is visualized and unremarkable.  Vascular/Lymphatic: No retroperitoneal or mesenteric adenopathy. Aorta normal  caliber.  Reproductive: No mass or other significant  abnormality.  Other: No free fluid or free air.  Musculoskeletal: No focal bone lesion or acute bony abnormality.  IMPRESSION: Colonic diverticulosis. Changes of active diverticulitis along the proximal sigmoid colon.  Cholelithiasis.   Electronically Signed   By: Rolm Baptise M.D.   On: 12/10/2014 21:23   Dg Abd 2 Views  12/14/2014   CLINICAL DATA:  Patient admitted Tuesday for diverticulitis of the large intestine. History of umbilical hernia repairs in 1999 and 2001.  EXAM: ABDOMEN - 2 VIEW  COMPARISON:  12/11/2014 and CT of the abdomen and pelvis on 12/10/2014  FINDINGS: At the right lung base there is a rounded opacity not seen on the previous exams and possibly representing something external to the patient measuring 2.3 cm. There are bilateral pleural effusions. There is no free intraperitoneal air.  Supine and erect views of the abdomen demonstrate residual contrast following CT exam on 06/20. There is mild gaseous distension of colonic loops. No evidence for small bowel obstruction. Multiple surgical clips are identified in the pelvis.  IMPRESSION: 1. Probable artifact external to the patient overlying the right lung base. 2. Residual contrast in colonic loops. 3. Nonobstructive bowel gas pattern.   Electronically Signed   By: Nolon Nations M.D.   On: 12/14/2014 18:37   Dg Abd Acute W/chest  12/11/2014   CLINICAL DATA:  Acute onset of vomiting and fever. Initial encounter.  EXAM: DG ABDOMEN ACUTE W/ 1V CHEST  COMPARISON:  CT of the abdomen and pelvis performed 12/10/2014, and chest radiograph performed 10/10/2014  FINDINGS: The lungs are well-aerated. Peribronchial thickening is noted. Minimal left basilar atelectasis is seen. There is no evidence of pleural effusion or pneumothorax. The cardiomediastinal silhouette is within normal limits.  The visualized bowel gas pattern is unremarkable. Scattered contrast is seen within the colon; there is no  evidence of small bowel dilatation to suggest obstruction. Scattered diverticulosis is noted along the ascending and proximal transverse colon. No free intra-abdominal air is identified on the provided upright view.  A pelvic mesh is noted.  No acute osseous abnormalities are seen; the sacroiliac joints are unremarkable in appearance. Cervical spinal fusion hardware is noted. The patient is status post right-sided rotator cuff repair.  IMPRESSION: 1. Colon partially filled with contrast, with scattered underlying diverticulosis. Unremarkable bowel gas pattern; no free intra-abdominal air seen. 2. Peribronchial thickening noted. Minimal left basilar atelectasis seen.   Electronically Signed   By: Garald Balding M.D.   On: 12/11/2014 22:45    EKG: Atrial fibrillation with a rapid ventricular rate with conversion to sinus rhythm.  ASSESSMENT AND PLAN:   74 year old female with history of paroxysmal atrial fibrillation, admitted with diverticulitis and sepsis, with gradual improvement. Patient has had intermittent atrial fibrillation, with a prolonged episode with rapid ventricular rate, converted to sinus rhythm following intravenous Cardizem bolus.  Recommendations  1. Continue Cardizem for rate control 2. Continue warfarin for stroke prevention 3. Add sotalol 80 mg twice a day for rhythm control 4. 2-D echocardiogram to assess left ventricular function  Signed: Ashely Goosby MD,PhD, [facility] 12/16/2014, 10:39 AM

## 2014-12-16 NOTE — Progress Notes (Signed)
Pt back in bed, has been evaluated by care management.  Pt amenable to SNF at discharge for rehab.  Pt denies any needs at this time.  Continued monitoring.

## 2014-12-16 NOTE — Progress Notes (Signed)
Pt had busy day. Initially in a fibi/flutter with rvr at 160's. Was to have gone  to ccu for cardizem gtt, but she converted to nsr approx 0900. Cardio consult done. Extra lasix dose given. Went for ct  Chest as she sat's 90 on 6 l n.c. Pulmonary consult done. Went for chest c.t. Which showed no p.e. She remains a/o and painfree. No bm today.

## 2014-12-16 NOTE — Clinical Social Work Placement (Signed)
   CLINICAL SOCIAL WORK PLACEMENT  NOTE  Date:  12/16/2014  Patient Details  Name: Laura Gonzalez MRN: [id number] Date of Birth: 04-13-1941  Clinical Social Work is seeking post-discharge placement for this patient at the [place] level of care (*CSW will initial, date and re-position this form in  chart as items are completed):  Yes   Patient/family provided with [place] Work Department's list of facilities offering this level of care within the geographic area requested by the patient (or if unable, by the patient's family).  Yes   Patient/family informed of their freedom to choose among providers that offer the needed level of care, that participate in Medicare, Medicaid or managed care program needed by the patient, have an available bed and are willing to accept the patient.  Yes   Patient/family informed of [facility]'s ownership interest in [facility] and [facility], as well as of the fact that they are under no obligation to receive care at these facilities.  PASRR submitted to EDS on       PASRR number received on       Existing PASRR number confirmed on 12/16/14     FL2 transmitted to all facilities in geographic area requested by pt/family on 12/16/14     FL2 transmitted to all facilities within larger geographic area on       Patient informed that his/her managed care company has contracts with or will negotiate with certain facilities, including the following:            Patient/family informed of bed offers received.  Patient chooses bed at       Physician recommends and patient chooses bed at      Patient to be transferred to   on  .  Patient to be transferred to facility by       Patient family notified on   of transfer.  Name of family member notified:        PHYSICIAN       Additional Comment:    _______________________________________________ Darden Dates, LCSW 12/16/2014, 5:33 PM

## 2014-12-16 NOTE — Progress Notes (Signed)
Pt HR still high. Dr. Marcille Blanco notified. Order for Cardizem 10 mg received.

## 2014-12-16 NOTE — Progress Notes (Signed)
Pt HR elevated, non sustaining, in between 150-185. Md, [facility] notified. Order received for Cardizem IV push 10mg .

## 2014-12-16 NOTE — Progress Notes (Signed)
Report received from Charlean Sanfilippo, pt care assumed.  Pt currently in NAD, skin warm and dry, respirations even and unlabored.  Sitting up in recliner watching TV.  Continued monitoring.

## 2014-12-16 NOTE — Progress Notes (Signed)
Pt's HR still elevated after administration of Cardizem IV push twice. Dr. Marcille Blanco notified. Pt to be transferred to ICU per Dr. Marcille Blanco to be placed on Cardizem drip.Dr. Aletha Halim call unit an put in orders.

## 2014-12-16 NOTE — Progress Notes (Signed)
ANTICOAGULATION CONSULT NOTE - FOLLOW UP Pharmacy Consult for Warfarin Indication: atrial fibrillation    Labs:  Recent Labs  12/14/14 0557 12/15/14 0506 12/16/14 0456  LABPROT 29.0* 34.1* 32.5*  INR 2.73 3.37 3.16    Estimated Creatinine Clearance: 68.5 mL/min (by C-G formula based on Cr of 0.88).  Assessment: Pharmacy consulted to manage warfarin therapy in this 74 year old female admitted for diverticulitis being treated with cipro and flagyl  Patient taking warfarin 4mg  daily at home  6/23  INR: 2.55,  6/24  INR: 2.73,  6/25  INR: 3.37, 6/26  INR: 3.16   Goal of Therapy:  INR 2-3   Plan:  Will not restart warfarin 4mg  daily at this time. Will check daily INR while on antimicrobials and reassess dosing then.  Pharmacy to follow per consult.  Curt Jews, [facility] Clinical Pharmacist  12/16/2014,5:38 AM

## 2014-12-16 NOTE — Consult Note (Signed)
Date: 12/16/2014,   MRN# [id number] Laura Gonzalez [birth year] Code Status:     Code Status Orders        Start     Ordered   12/12/14 0301  Full code   Continuous     12/12/14 0301     Hosp day:@LENGTHOFSTAYDAYS @ Referring MD: @ATDPROV @     PCP:      AdmissionWeight: 240 lb (108.863 kg)                 CurrentWeight: 225 lb 8 oz (102.286 kg)   EVENTS OVER NIGHT    Acute afib with RVR wuith SOb Patient iniatially admitted for acute sepsis with diverticulitis, now with acute SOb and afib with RVR  Patient alert and awake, but feels SOb     MEDICATIONS    Home Medication:  No current outpatient prescriptions on file.  Current Medication:   Current facility-administered medications:  .  acetaminophen (TYLENOL) tablet 650 mg, 650 mg, Oral, Q6H PRN, 650 mg at 12/16/14 0657 **OR** acetaminophen (TYLENOL) suppository 650 mg, 650 mg, Rectal, Q6H PRN, Lance Coon, MD .  albuterol (PROVENTIL) (2.5 MG/3ML) 0.083% nebulizer solution 2.5 mg, 2.5 mg, Nebulization, Q6H PRN, Lance Coon, MD, 2.5 mg at 12/14/14 1045 .  bisacodyl (DULCOLAX) EC tablet 5 mg, 5 mg, Oral, Daily PRN, Max Sane, MD, 5 mg at 12/16/14 0233 .  ciprofloxacin (CIPRO) tablet 500 mg, 500 mg, Oral, BID, Max Sane, MD, 500 mg at 12/16/14 0756 .  cyclobenzaprine (FLEXERIL) tablet 10 mg, 10 mg, Oral, TID PRN, Fritzi Mandes, MD .  Derrill Memo ON 12/17/2014] diltiazem (CARDIZEM CD) 24 hr capsule 300 mg, 300 mg, Oral, Daily, Fritzi Mandes, MD .  docusate sodium (COLACE) capsule 100 mg, 100 mg, Oral, Daily PRN, Max Sane, MD, 100 mg at 12/14/14 1801 .  ezetimibe (ZETIA) tablet 10 mg, 10 mg, Oral, QHS, Fritzi Mandes, MD, 10 mg at 12/15/14 2154 .  feeding supplement (ENSURE ENLIVE) (ENSURE ENLIVE) liquid 237 mL, 237 mL, Oral, BID BM, Fritzi Mandes, MD, 237 mL at 12/15/14 1400 .  furosemide (LASIX) injection 40 mg, 40 mg, Intravenous, Once, Fritzi Mandes, MD .  hydrochlorothiazide (HYDRODIURIL) tablet 25 mg, 25 mg, Oral, Daily, Lance Coon, MD, 25 mg at 12/16/14 1004 .  HYDROcodone-acetaminophen (NORCO/VICODIN) 5-325 MG per tablet 1-2 tablet, 1-2 tablet, Oral, Q4H PRN, Lance Coon, MD .  loratadine (CLARITIN) tablet 10 mg, 10 mg, Oral, Daily, Max Sane, MD, 10 mg at 12/16/14 1003 .  magnesium oxide (MAG-OX) tablet 400 mg, 400 mg, Oral, Daily, Fritzi Mandes, MD, 400 mg at 12/16/14 1003 .  metroNIDAZOLE (FLAGYL) tablet 500 mg, 500 mg, Oral, 3 times per day, Max Sane, MD, 500 mg at 12/16/14 0539 .  mometasone-formoterol (DULERA) 100-5 MCG/ACT inhaler 2 puff, 2 puff, Inhalation, BID, Lance Coon, MD, 2 puff at 12/16/14 0756 .  montelukast (SINGULAIR) tablet 10 mg, 10 mg, Oral, QHS, Lance Coon, MD, 10 mg at 12/15/14 2155 .  multivitamin with minerals tablet 1 tablet, 1 tablet, Oral, Daily, Fritzi Mandes, MD, 1 tablet at 12/16/14 1007 .  multivitamin-lutein (OCUVITE-LUTEIN) capsule, , Oral, Daily, Fritzi Mandes, MD .  omega-3 acid ethyl esters (LOVAZA) capsule 1 g, 1 g, Oral, Daily, Fritzi Mandes, MD, 1 g at 12/16/14 1004 .  ondansetron (ZOFRAN) tablet 4 mg, 4 mg, Oral, Q6H PRN, 4 mg at 12/13/14 0306 **OR** ondansetron (ZOFRAN) injection 4 mg, 4 mg, Intravenous, Q6H PRN, Lance Coon, MD, 4 mg at 12/14/14 0423 .  polyethylene glycol (MIRALAX /  GLYCOLAX) packet 17 g, 17 g, Oral, Daily, Fritzi Mandes, MD, 17 g at 12/16/14 1004 .  sodium chloride 0.9 % injection 3 mL, 3 mL, Intravenous, Q12H, Lance Coon, MD, 3 mL at 12/16/14 1005 .  sotalol (BETAPACE) tablet 80 mg, 80 mg, Oral, Q12H, Isaias Cowman, MD .  theophylline (THEODUR) 12 hr tablet 300 mg, 300 mg, Oral, BID, Lance Coon, MD, 150 mg at 12/16/14 1002 .  traMADol (ULTRAM) tablet 50 mg, 50 mg, Oral, Q6H PRN, Fritzi Mandes, MD, 50 mg at 12/15/14 0940 .  vitamin B-12 (CYANOCOBALAMIN) tablet 1,000 mcg, 1,000 mcg, Oral, Daily, Fritzi Mandes, MD, 1,000 mcg at 12/16/14 1003 .  Warfarin - Pharmacist Dosing Inpatient, , Does not apply, q1800, Fritzi Mandes, MD     ALLERGIES   Amoxicillin;  Benzocaine-menthol; Biafine; Chloraseptic sore throat; Fosamax; Neosporin; Other; Oxycodone; Statins; and Tape     REVIEW OF SYSTEMS   Review of Systems  Constitutional: Positive for malaise/fatigue. Negative for fever, chills, weight loss and diaphoresis.  HENT: Negative for hearing loss and tinnitus.   Eyes: Negative for blurred vision, double vision and photophobia.  Respiratory: Positive for shortness of breath. Negative for cough, hemoptysis, sputum production and wheezing.   Cardiovascular: Positive for orthopnea. Negative for chest pain and palpitations.  Gastrointestinal: Negative for heartburn, nausea and vomiting.  Genitourinary: Negative for dysuria, urgency and frequency.  Musculoskeletal: Negative for myalgias and neck pain.  Skin: Negative for itching and rash.  Neurological: Negative for dizziness, tingling, tremors, weakness and headaches.  Endo/Heme/Allergies: Does not bruise/bleed easily.  Psychiatric/Behavioral: Negative for depression and suicidal ideas.     VS: BP 113/71 mmHg  Pulse 161  Temp(Src) 98.7 F (37.1 C) (Oral)  Resp 20  Ht 5\' 5"  (1.651 m)  Wt 225 lb 8 oz (102.286 kg)  BMI 37.53 kg/m2  SpO2 91%     PHYSICAL EXAM   Physical Exam  Constitutional: She is oriented to person, place, and time. She appears well-developed and well-nourished. She appears distressed.  HENT:  Head: Normocephalic and atraumatic.  Mouth/Throat: No oropharyngeal exudate.  Eyes: EOM are normal. Pupils are equal, round, and reactive to light. No scleral icterus.  Neck: Normal range of motion. Neck supple.  Cardiovascular: Normal rate, regular rhythm and normal heart sounds.   No murmur heard. Pulmonary/Chest: No stridor. She is in respiratory distress. She has no wheezes. She has rales.  Abdominal: Soft. Bowel sounds are normal. She exhibits no distension. There is no tenderness. There is no rebound.  Musculoskeletal: Normal range of motion. She exhibits no edema.   Neurological: She is alert and oriented to person, place, and time. She displays normal reflexes. Coordination normal.  Skin: Skin is warm. She is not diaphoretic.  Psychiatric: She has a normal mood and affect.        LABS    Recent Labs     12/14/14  0557  12/15/14  0506  12/16/14  0456  INR  2.73  3.37  3.16  ,    No results for input(s): PH in the last 72 hours.  Invalid input(s): PCO2, PO2, BASEEXCESS, BASEDEFICITE, TFT    CULTURE RESULTS   Recent Results (from the past 240 hour(s))  MRSA PCR Screening     Status: None   Collection Time: 12/11/14  4:50 AM  Result Value Ref Range Status   MRSA by PCR NEGATIVE NEGATIVE Final    Comment:        The GeneXpert MRSA Assay (FDA approved for NASAL  specimens only), is one component of a comprehensive MRSA colonization surveillance program. It is not intended to diagnose MRSA infection nor to guide or monitor treatment for MRSA infections.   Culture, blood (routine x 2)     Status: None (Preliminary result)   Collection Time: 12/12/14  1:10 AM  Result Value Ref Range Status   Specimen Description BLOOD  Final   Special Requests NONE  Final   Culture NO GROWTH 4 DAYS  Final   Report Status PENDING  Incomplete  Culture, blood (routine x 2)     Status: None (Preliminary result)   Collection Time: 12/12/14  1:10 AM  Result Value Ref Range Status   Specimen Description BLOOD  Final   Special Requests NONE  Final   Culture NO GROWTH 4 DAYS  Final   Report Status PENDING  Incomplete          IMAGING    Dg Chest 1 View  12/16/2014   CLINICAL DATA:  Abdominal pain and nausea/diverticulitis. Shortness of breath.  EXAM: CHEST  1 VIEW  COMPARISON:  12/11/2014  FINDINGS: Lungs are moderately hypoinflated with bibasilar opacification suggesting small to moderate bilateral effusions with associated basilar atelectasis. Cannot completely exclude infection in the lung bases. There is increased opacification in the right  hilar/perihilar region. Cardiomediastinal silhouette and remainder of the exam is unchanged.  IMPRESSION: Moderate right hilar/ perihilar opacification which may be due to a pneumonia with evidence of small to moderate bibasilar effusions/ atelectasis.   Electronically Signed   By: Marin Olp M.D.   On: 12/16/2014 10:20            ASSESSMENT/PLAN   74 yo morbidly obese white female with acute diverticulitis with acute Afib with RVR with acute SOB likley from acute pulm edema  1.oxygen as needed 2.obtain CT chest to assess for PE 3.start dounebs and pulmicort nebs 4.lasix as tolerated 5.follow up cardiology recs-check ECHO  I have personally obtained a history, examined the patient, evaluated laboratory and imaging results, formulated the assessment and plan and placed orders.  The Patient requires high complexity decision making for assessment and support, frequent evaluation and titration of therapies, application of advanced monitoring technologies and extensive interpretation of multiple databases.   Time spent 40 mins  Corrin Parker, M.D. [facility] [place] Director Intensive Care Unit

## 2014-12-17 LAB — BLOOD GAS, ARTERIAL
ACID-BASE EXCESS: 12.1 mmol/L — AB (ref 0.0–3.0)
Allens test (pass/fail): POSITIVE — AB
Bicarbonate: 36.7 mEq/L — ABNORMAL HIGH (ref 21.0–28.0)
FIO2: 0.44 %
O2 SAT: 94.1 %
Patient temperature: 37
pCO2 arterial: 46 mmHg (ref 32.0–48.0)
pH, Arterial: 7.51 — ABNORMAL HIGH (ref 7.350–7.450)
pO2, Arterial: 64 mmHg — ABNORMAL LOW (ref 83.0–108.0)

## 2014-12-17 LAB — CREATININE, SERUM
Creatinine, Ser: 0.94 mg/dL (ref 0.44–1.00)
GFR calc Af Amer: >60 mL/min (ref >60)
GFR calc non Af Amer: 59 mL/min — ABNORMAL LOW (ref >60)

## 2014-12-17 LAB — CULTURE, BLOOD (ROUTINE X 2)
Culture: NO GROWTH
Culture: NO GROWTH

## 2014-12-17 LAB — C DIFFICILE QUICK SCREEN W PCR REFLEX
C Diff antigen: NEGATIVE
C Diff interpretation: NEGATIVE
C Diff toxin: NEGATIVE

## 2014-12-17 LAB — HEMOGLOBIN: Hemoglobin: 15.2 g/dL (ref 12.0–16.0)

## 2014-12-17 LAB — PROTIME-INR
INR: 2.85
Prothrombin Time: 30 s — ABNORMAL HIGH (ref 11.4–15.0)

## 2014-12-17 MED ORDER — WARFARIN SODIUM 1 MG PO TABS
2.0000 mg | ORAL_TABLET | Freq: Every day | ORAL | Status: DC
  Administered 2014-12-17: 2 mg via ORAL
  Filled 2014-12-17: qty 2

## 2014-12-17 MED ORDER — SODIUM CHLORIDE 0.9 % IV SOLN
1.0000 g | Freq: Three times a day (TID) | INTRAVENOUS | Status: DC
  Administered 2014-12-17 – 2014-12-20 (×10): 1 g via INTRAVENOUS
  Filled 2014-12-17 (×15): qty 1

## 2014-12-17 MED ORDER — TRAMADOL HCL 50 MG PO TABS: 50.0000 mg | ORAL_TABLET | Freq: Four times a day (QID) | ORAL | Status: DC | PRN

## 2014-12-17 MED ORDER — VANCOMYCIN HCL 10 G IV SOLR
1250.0000 mg | INTRAVENOUS | Status: DC
  Administered 2014-12-18 – 2014-12-19 (×3): 1250 mg via INTRAVENOUS
  Filled 2014-12-17 (×6): qty 1250

## 2014-12-17 MED ORDER — VANCOMYCIN HCL 10 G IV SOLR
1500.0000 mg | Freq: Once | INTRAVENOUS | Status: AC
  Administered 2014-12-17: 1500 mg via INTRAVENOUS
  Filled 2014-12-17 (×2): qty 1500

## 2014-12-17 MED ORDER — ENSURE ENLIVE PO LIQD
237.0000 mL | Freq: Three times a day (TID) | ORAL | Status: DC
  Administered 2014-12-17 – 2014-12-20 (×5): 237 mL via ORAL

## 2014-12-17 NOTE — Progress Notes (Signed)
Spoke with Dr Edwina Barth and told to go ahead and give pt sotalol from PM dosing and continue to monitor. Lynnda Shields, RN

## 2014-12-17 NOTE — Progress Notes (Signed)
ANTICOAGULATION CONSULT NOTE - FOLLOW UP Pharmacy Consult for Warfarin Indication: atrial fibrillation    Labs:  Recent Labs  12/15/14 0506 12/16/14 0456 12/17/14 0359  LABPROT 34.1* 32.5* 30.0*  INR 3.37 3.16 2.85    Estimated Creatinine Clearance: 69.1 mL/min (by C-G formula based on Cr of 0.88).  Assessment: Pharmacy consulted to manage warfarin therapy in this 74 year old female admitted for diverticulitis being treated with cipro and flagyl  Patient taking warfarin 4mg  daily at home  6/23  INR: 2.55, Coumadin 4 mg 6/24  INR: 2.73, Coumadin 4 mg 6/25  INR: 3.37, no Coumadin 6/26  INR: 3.16, no Coumadin 6/27  INR: 2.85   Goal of Therapy:  INR 2-3   Plan:  INR within goal range after holding for two days. Will give warfarin 2 mg PO for today (ordered as daily). Will check daily INR while on antimicrobials and reassess dosing then. Will order Hgb.   Pharmacy to follow per consult.   Rayna Sexton, PharmD, BCPS Clinical Pharmacist  12/17/2014,8:34 AM

## 2014-12-17 NOTE — Consult Note (Signed)
GI Inpatient Follow-up Note  Patient Identification: Laura Gonzalez is a 74 y.o. female with diverticulits  Subjective: Laura Gonzalez reports that she had a loose BM today.  She is still not eating any solid foods, the smell is still making her nauseas.  She is drinking, and tolerating, Ensure.  She reports her abdominal pain is continuing to improve and is now just a 2 when she moves.  Scheduled Inpatient Medications:  . budesonide (PULMICORT) nebulizer solution  0.5 mg Nebulization BID  . diltiazem  300 mg Oral Daily  . ezetimibe  10 mg Oral QHS  . feeding supplement (ENSURE ENLIVE)  237 mL Oral BID BM  . hydrochlorothiazide  25 mg Oral Daily  . ipratropium-albuterol  3 mL Nebulization Q4H  . loratadine  10 mg Oral Daily  . magnesium oxide  400 mg Oral Daily  . meropenem (MERREM) IV  1 g Intravenous 3 times per day  . mometasone-formoterol  2 puff Inhalation BID  . montelukast  10 mg Oral QHS  . multivitamin with minerals  1 tablet Oral Daily  . multivitamin-lutein   Oral Daily  . omega-3 acid ethyl esters  1 g Oral Daily  . sodium chloride  3 mL Intravenous Q12H  . sotalol  80 mg Oral Q12H  . theophylline  300 mg Oral BID  . vitamin B-12  1,000 mcg Oral Daily  . warfarin  2 mg Oral q1800  . Warfarin - Pharmacist Dosing Inpatient   Does not apply q1800    Continuous Inpatient Infusions:     PRN Inpatient Medications:  acetaminophen **OR** acetaminophen, bisacodyl, cyclobenzaprine, docusate sodium, HYDROcodone-acetaminophen, ondansetron **OR** ondansetron (ZOFRAN) IV, traMADol  Review of Systems: Constitutional: Weight is stable.  Eyes: No changes in vision. ENT: No oral lesions, sore throat.  GI: see HPI.  Heme/Lymph: No easy bruising.  CV: No chest pain.  GU: No hematuria.  Integumentary: No rashes.  Neuro: No headaches.  Psych: No depression/anxiety.  Endocrine: No heat/cold intolerance.  Allergic/Immunologic: No urticaria.  Resp: ++ SOB.  Musculoskeletal: No joint  swelling.    Physical Examination: BP 119/60 mmHg  Pulse 88  Temp(Src) 99.3 F (37.4 C) (Oral)  Resp 20  Ht 5\' 5"  (1.651 m)  Wt 103.874 kg (229 lb)  BMI 38.11 kg/m2  SpO2 86% Gen: NAD, alert and oriented x 4, patient was sitting in chair and was SOB HEENT: PEERLA, EOMI, Neck: supple, no JVD or thyromegaly Chest: She was very short of breath during my exam.  Labored breathing noted. CV: RRR, no m/g/c/r Abd: soft, LLQ tenderness, ND, +BS in all four quadrants; no HSM, guarding, ridigity, or rebound tenderness Ext: no edema, well perfused with 2+ pulses, Skin: no rash or lesions noted Lymph: no LAD  Data: Lab Results  Component Value Date   WBC 10.8 12/12/2014   HGB 15.2 12/17/2014   HCT 44.0 12/12/2014   MCV 92.6 12/12/2014   PLT 184 12/12/2014    Recent Labs Lab 12/12/14 0206 12/12/14 0408 12/17/14 0921  HGB 14.2 14.6 15.2   Lab Results  Component Value Date   NA 139 12/12/2014   K 3.7 12/12/2014   CL 101 12/12/2014   CO2 31 12/12/2014   BUN 16 12/12/2014   CREATININE 0.94 12/17/2014   Lab Results  Component Value Date   ALT 15 12/10/2014   AST 20 12/10/2014   ALKPHOS 89 12/10/2014   BILITOT 0.5 12/10/2014    Recent Labs Lab 12/17/14 0359  INR 2.85  Assessment/Plan: Laura Gonzalez is a 74 y.o. female with diverticulitis  Recommendations: Her diverticulitis appears to be resolving.  We will hold the Miralax for now due to the loose BM. Marland Kitchen Encouraged her to work towards eating more foods. We will continue to follow with you Please call with questions or concerns.  Salvadore Farber, PA-C  I personally performed these services.

## 2014-12-17 NOTE — Progress Notes (Signed)
Pt's HR in 130s-150s.  Dr Posey Pronto and on-call prime doc paged.  Awaiting callback. Lynnda Shields, RN

## 2014-12-17 NOTE — Consult Note (Signed)
  Ct chest Reviewed, evidence of air bronchogram signs in RML, RLL, LLL. Consolidative process  Would treat as HCAP  1.start vancomycin and zosyn 2.obtain sputum sample 3.oxygen as needed 4.will follow up in next 24-48 hrs and re-assess resp status  Communicated with Dr Fritzi Mandes

## 2014-12-17 NOTE — Progress Notes (Signed)
[facility] Cardiology  SUBJECTIVE: I'm feeling better   Filed Vitals:   12/16/14 2021 12/17/14 0011 12/17/14 0328 12/17/14 0433  BP: 132/72   143/72  Pulse: 88   92  Temp: 99.1 F (37.3 C)   99.5 F (37.5 C)  TempSrc: Oral   Oral  Resp: 23   23  Height:      Weight:    103.874 kg (229 lb)  SpO2:  93% 92% 91%     Intake/Output Summary (Last 24 hours) at 12/17/14 0801 Last data filed at 12/17/14 0029  Gross per 24 hour  Intake      0 ml  Output    625 ml  Net   -625 ml      PHYSICAL EXAM  General: Well developed, well nourished, in no acute distress HEENT:  Normocephalic and atramatic Neck:  No JVD.  Lungs: Clear bilaterally to auscultation and percussion. Heart: HRRR . Normal S1 and S2 without gallops or murmurs.  Abdomen: Bowel sounds are positive, abdomen soft and non-tender  Msk:  Back normal, normal gait. Normal strength and tone for age. Extremities: No clubbing, cyanosis or edema.   Neuro: Alert and oriented X 3. Psych:  Good affect, responds appropriately   LABS: Basic Metabolic Panel: No results for input(s): NA, K, CL, CO2, GLUCOSE, BUN, CREATININE, CALCIUM, MG, PHOS in the last 72 hours. Liver Function Tests: No results for input(s): AST, ALT, ALKPHOS, BILITOT, PROT, ALBUMIN in the last 72 hours. No results for input(s): LIPASE, AMYLASE in the last 72 hours. CBC: No results for input(s): WBC, NEUTROABS, HGB, HCT, MCV, PLT in the last 72 hours. Cardiac Enzymes: No results for input(s): CKTOTAL, CKMB, CKMBINDEX, TROPONINI in the last 72 hours. BNP: Invalid input(s): POCBNP D-Dimer: No results for input(s): DDIMER in the last 72 hours. Hemoglobin A1C: No results for input(s): HGBA1C in the last 72 hours. Fasting Lipid Panel: No results for input(s): CHOL, HDL, LDLCALC, TRIG, CHOLHDL, LDLDIRECT in the last 72 hours. Thyroid Function Tests: No results for input(s): TSH, T4TOTAL, T3FREE, THYROIDAB in the last 72 hours.  Invalid input(s): FREET3 Anemia  Panel: No results for input(s): VITAMINB12, FOLATE, FERRITIN, TIBC, IRON, RETICCTPCT in the last 72 hours.  Dg Chest 1 View  12/16/2014   CLINICAL DATA:  Abdominal pain and nausea/diverticulitis. Shortness of breath.  EXAM: CHEST  1 VIEW  COMPARISON:  12/11/2014  FINDINGS: Lungs are moderately hypoinflated with bibasilar opacification suggesting small to moderate bilateral effusions with associated basilar atelectasis. Cannot completely exclude infection in the lung bases. There is increased opacification in the right hilar/perihilar region. Cardiomediastinal silhouette and remainder of the exam is unchanged.  IMPRESSION: Moderate right hilar/ perihilar opacification which may be due to a pneumonia with evidence of small to moderate bibasilar effusions/ atelectasis.   Electronically Signed   By: Marin Olp M.D.   On: 12/16/2014 10:20   Ct Angio Chest Pe W/cm &/or Wo Cm  12/16/2014   CLINICAL DATA:  Admitted for acute diverticulitis with acute atrial fibrillation with RVR. Acute shortness of breath likely from pulmonary edema. Evaluate for pulmonary embolus. Abnormal chest x-ray.  EXAM: CT ANGIOGRAPHY CHEST WITH CONTRAST  TECHNIQUE: Multidetector CT imaging of the chest was performed using the standard protocol during bolus administration of intravenous contrast. Multiplanar CT image reconstructions and MIPs were obtained to evaluate the vascular anatomy.  CONTRAST:  127mL OMNIPAQUE IOHEXOL 350 MG/ML SOLN  COMPARISON:  Chest x-ray 12/16/2014  FINDINGS: Heart: Heart is mildly enlarged. No Pericardial effusion. A  minimal coronary artery disease.  Vascular structures: The pulmonary arteries are well opacified. There is no evidence for acute pulmonary embolus.  Mediastinum/thyroid: The visualized portion of the thyroid gland has a normal appearance. No mediastinal, hilar, or axillary adenopathy.  Lungs/Airways: Bilateral pleural effusions, right greater than left. There is significant consolidation both lungs,  involving right middle lobe and both lower lobes.  Upper abdomen: Within the region of the pancreatic tail there is a small cyst measuring 0.8 cm.  Chest wall/osseous structures:  Negative  Review of the MIP images confirms the above findings.  IMPRESSION: 1. Cardiomegaly. 2. Technically adequate exam.  No pulmonary embolus identified. 3. Moderate pleural effusions right greater than left. 4. Bilateral lung consolidation involving right middle lobe and bilateral lower lobes. 5. Incidentally detected 8 mm cyst within the pancreatic tail, statistically likely benign. Per consensus recommendations, follow-up imaging study is recommended in 1 year, ideally MRI.   Electronically Signed   By: Nolon Nations M.D.   On: 12/16/2014 12:35     Echo pending  TELEMETRY: Normal sinus rhythm:  ASSESSMENT AND PLAN:  Principal Problem:   Persistent atrial fibrillation Active Problems:   Diverticulitis   Sepsis   HTN (hypertension)   GERD (gastroesophageal reflux disease)   Asthma    74 year old female with paroxysmal atrial fibrillation presents with atrial fibrillation with rapid ventricular rate in the setting of sepsis and diverticulitis, converted to sinus rhythm after Cardizem bolus. Patient started on the pace 80 mg twice a day and remains in sinus rhythm.  Recommendations  1. Recurrent medical therapy 2. Continue Betapace for rhythm control 3. Continue warfarin for stroke prevention 4. Review 2-D echocardiogram   Isaias Cowman, MD, PhD, [facility] 12/17/2014 8:01 AM

## 2014-12-17 NOTE — Clinical Social Work Note (Addendum)
CSW confirmed that pt would be able to DC to [facility] if she was able to stay below 6L.  Facility is only equiped to handle 5L of O2.  Per PT pt's was going over 6L.  O2 requirments are currently too high for [employer]  Facility stated that they would hold a bed for pt for her until tomorrow.  If her O2 is stable at 5L then she can DC to [facility].

## 2014-12-17 NOTE — Care Management (Signed)
Important Message  Patient Details  Name: RODERICA CATHELL MRN: [id number] Date of Birth: 1940/11/05   Medicare Important Message Given:  Yes-second notification given    Juliann Pulse A Allmond 12/17/2014, 3:12 PM

## 2014-12-17 NOTE — Care Management (Signed)
Important Message  Patient Details  Name: Laura Gonzalez MRN: [id number] Date of Birth: [birth year]   Medicare Important Message Given:  Yes    Juliann Pulse A Allmond 12/17/2014, 11:41 AM

## 2014-12-17 NOTE — Progress Notes (Signed)
Per discussion with Tilford Pillar, LCSW tried pt at [facility] and pt O2 sats stayed at 91% at rest.  No visible signs of dyspnea.  Physical Therapy states pt was at 86% with exertion on 8LNC.  Placed pt on 5LNC at rest.  O2 sat at 85%, bumped up to [facility] and will continue to monitor. Lynnda Shields, RN

## 2014-12-17 NOTE — Progress Notes (Addendum)
Initial Nutrition Assessment    INTERVENTION:   Medical Food Supplement: recommend increasing Ensure to TID Meals/Snacks: cater to pt preferences  NUTRITION DIAGNOSIS:  No nutrition diagnosis at this time  GOAL:  Patient will meet greater than or equal to 90% of their needs  MONITOR:   (Energy Intake, Digestive System, Anthropometrics, Electrolyte/Renal Profile)  REASON FOR ASSESSMENT:   (Length of Stay)    ASSESSMENT:  Pt admitted with persistant afib, diverticulitis, pulmonary edema  PMHx:  Past Medical History  Diagnosis Date  . Dysrhythmia     afib  . Pneumonia   . Asthma   . Numbness and tingling     arm to leg  . Arthritis     wrist and knees  . Hernia, umbilical   . Shortness of breath     only with astma attacks  . Sleep apnea   . Cancer     skin cancer  . Osteoporosis   . Hypertension   . GERD (gastroesophageal reflux disease)   . Diverticulosis     Diet Order: Soft   Current Nutrition: pt ate 25% at breakfast this AM; recorded po intake 48% of meals, drinking Ensure   Medications: MVI   Electrolyte/Renal Profile and Glucose Profile:   Recent Labs Lab 12/10/14 1942 12/12/14 0005 12/12/14 0408 12/17/14 0921  NA 141 138 139  --   K 4.2 3.9 3.7  --   CL 100* 103 101  --   CO2 31 27 31   --   BUN 22* 17 16  --   CREATININE 0.77 0.94 0.88 0.94  CALCIUM 10.2 9.9 9.4  --   GLUCOSE 95 133* 119*  --    Protein Profile:   Recent Labs Lab 12/10/14 1942  ALBUMIN 4.0    Gastrointestinal Profile: Last BM: 6/26 after enema   Anthropometrics: Height:  Ht Readings from Last 1 Encounters:  12/12/14 5\' 5"  (1.651 m)    Weight:  Wt Readings from Last 1 Encounters:  12/17/14 229 lb (103.874 kg)    Filed Weights   12/15/14 0613 12/16/14 0436 12/17/14 0433  Weight: 229 lb 4.8 oz (104.01 kg) 225 lb 8 oz (102.286 kg) 229 lb (103.874 kg)    Wt Readings from Last 10 Encounters:  12/17/14 229 lb (103.874 kg)  12/10/14 240 lb  (108.863 kg)  08/09/12 230 lb 6.1 oz (104.5 kg)  08/02/12 230 lb 4.8 oz (104.463 kg)    BMI:  Body mass index is 38.11 kg/(m^2).  Diet Order:  DIET SOFT Room service appropriate?: Yes; Fluid consistency:: Thin   LOW Care Level  [facility] MS, RD, LDN [phone] Pager    Patient seen and examined.  Minimal discomfort left abdomen.  Hospitalization complicated by AF, PNA.  Will need colonoscopy when clinically feasible, once above is resolved as o/p.  Will follow at a distance.

## 2014-12-17 NOTE — Progress Notes (Signed)
Physical Therapy Treatment Patient Details Name: Laura Gonzalez MRN: [id number] DOB: 01-29-1941 Today's Date: 12/17/2014    History of Present Illness Laura Gonzalez is a 74 y.o. female who presents with diverticulitis. Patient came to the ED yesterday with several days complaint of left lower quadrant pain and was found on imaging to have diverticulitis. She was sent home with oral antibiotics. She returned today after spiking several fevers, having shaking chills, and nausea and vomiting to the point that she was not able to keep down her by mouth medications. She was tachycardic on arrival here. Hospitalists were called for admission for sepsis secondary to her diverticulitis, and for diverticulitis having failed outpatient therapy.    PT Comments    Pt tolerating treatment session well, motivated and able to complete entire PT session, but limited by SOB and dizziness. Pt progress toward goals limited by worsening O2 perfusion, however, since PT eval, pt diagnosed c PNA and has since started ABX, hence improvement in perfusion is expected soon. Patient presenting with impairment of strength, range of motion, balance, and activity tolerance, limiting ability to perform ADL and mobility tasks at  baseline level of function. Patient will benefit from skilled intervention to address the above impairments and limitations, in order to restore to prior level of function, improve patient safety upon discharge, and to decrease caregiver burden.    Follow Up Recommendations  SNF     Equipment Recommendations  Rolling walker with 5" wheels    Recommendations for Other Services       Precautions / Restrictions Precautions Precautions: Fall Restrictions Weight Bearing Restrictions: No    Mobility  Bed Mobility Overal bed mobility: Needs Assistance Bed Mobility: Supine to Sit     Supine to sit: Mod assist     General bed mobility comments: Requires a single arm to pull self up to sitting.    Transfers Overall transfer level: Needs assistance Equipment used: None Transfers: Sit to/from Stand Sit to Stand: Min guard            Ambulation/Gait Ambulation/Gait assistance: Supervision Ambulation Distance (Feet): 40 Feet Assistive device: 1 person hand held assist Gait Pattern/deviations: Antalgic Gait velocity: Pt unsteady on fet, c/o lightheadedness unrelenting.    General Gait Details: HR stable in 90's, and SaO2 stable between 86-87% on 8L.    Stairs            Wheelchair Mobility    Modified Rankin (Stroke Patients Only)       Balance Overall balance assessment: Needs assistance   Sitting balance-Leahy Scale: Normal     Standing balance support: Single extremity supported Standing balance-Leahy Scale: Fair                      Cognition Arousal/Alertness: Awake/alert Behavior During Therapy: WFL for tasks assessed/performed Overall Cognitive Status: Within Functional Limits for tasks assessed                      Exercises      General Comments        Pertinent Vitals/Pain Pain Assessment: No/denies pain    Home Living                      Prior Function            PT Goals (current goals can now be found in the care plan section) Acute Rehab PT Goals Patient Stated Goal: Return to PLOF in ADL,  IADL. PT Goal Formulation: With patient Time For Goal Achievement: 12/29/14 Potential to Achieve Goals: Good Progress towards PT goals: Not progressing toward goals - comment (Limited by O2 perfusion. )    Frequency  Min 2X/week    PT Plan Current plan remains appropriate    Co-evaluation             End of Session Equipment Utilized During Treatment: Oxygen Activity Tolerance: Patient limited by fatigue Patient left: in chair;with nursing/sitter in room     Time: 1439-1449 PT Time Calculation (min) (ACUTE ONLY): 10 min  Charges:  $Therapeutic Activity: 8-22 mins                    G  Codes:      Reighlynn Swiney C 01-03-15, 2:55 PM 2:57 PM  Etta Grandchild, PT, DPT [place] License # [id number]

## 2014-12-17 NOTE — Progress Notes (Addendum)
ANTIBIOTIC CONSULT NOTE - INITIAL  Pharmacy Consult for Vancomycin/Meropenem Indication: pneumonia  Allergies  Allergen Reactions  . Amoxicillin Other (See Comments)    Lips swelling, tingling  . Benzocaine-Menthol     Throat swelling  . Biafine [Wound Dressings] Swelling  . Chloraseptic Sore Throat [Acetaminophen] Other (See Comments)    throat swelling  . Fosamax [Alendronate Sodium]   . Neosporin [Neomycin-Bacitracin Zn-Polymyx] Other (See Comments)    Skin redness, puffy  . Other     "chlorotrimitron"-- rxn: throat swelling  . Oxycodone Other (See Comments)    Dizziness, lips tingling  . Statins Other (See Comments)    Muscle weakness, weak  . Tape Other (See Comments)    Skin redness    Patient Measurements: Height: 5\' 5"  (165.1 cm) Weight: 229 lb (103.874 kg) IBW/kg (Calculated) : 57 Adjusted Body Weight: 76 kg  Vital Signs: Temp: 99.5 F (37.5 C) (06/27 0433) Temp Source: Oral (06/27 0433) BP: 143/72 mmHg (06/27 0433) Pulse Rate: 92 (06/27 0433) Intake/Output from previous day: 06/26 0701 - 06/27 0700 In: -  Out: 625 [Urine:625] Intake/Output from this shift:    Labs: No results for input(s): WBC, HGB, PLT, LABCREA, CREATININE in the last 72 hours. Estimated Creatinine Clearance: 69.1 mL/min (by C-G formula based on Cr of 0.88). No results for input(s): VANCOTROUGH, VANCOPEAK, VANCORANDOM, GENTTROUGH, GENTPEAK, GENTRANDOM, TOBRATROUGH, TOBRAPEAK, TOBRARND, AMIKACINPEAK, AMIKACINTROU, AMIKACIN in the last 72 hours.   Microbiology: Recent Results (from the past 720 hour(s))  MRSA PCR Screening     Status: None   Collection Time: 12/11/14  4:50 AM  Result Value Ref Range Status   MRSA by PCR NEGATIVE NEGATIVE Final    Comment:        The GeneXpert MRSA Assay (FDA approved for NASAL specimens only), is one component of a comprehensive MRSA colonization surveillance program. It is not intended to diagnose MRSA infection nor to guide or monitor  treatment for MRSA infections.   Culture, blood (routine x 2)     Status: None   Collection Time: 12/12/14  1:10 AM  Result Value Ref Range Status   Specimen Description BLOOD  Final   Special Requests NONE  Final   Culture NO GROWTH 5 DAYS  Final   Report Status 12/17/2014 FINAL  Final  Culture, blood (routine x 2)     Status: None   Collection Time: 12/12/14  1:10 AM  Result Value Ref Range Status   Specimen Description BLOOD  Final   Special Requests NONE  Final   Culture NO GROWTH 5 DAYS  Final   Report Status 12/17/2014 FINAL  Final    Medical History: Past Medical History  Diagnosis Date  . Dysrhythmia     afib  . Pneumonia   . Asthma   . Numbness and tingling     arm to leg  . Arthritis     wrist and knees  . Hernia, umbilical   . Shortness of breath     only with astma attacks  . Sleep apnea   . Cancer     skin cancer  . Osteoporosis   . Hypertension   . GERD (gastroesophageal reflux disease)   . Diverticulosis      Assessment: 74 yo female here with sepsis from diverticulitis on cipro/metronidazole now with PNA to transition to vancomycin/Zosyn. Pt with allergy to amoxicillin. Per MD, after speaking to pt changing to meropenem.  Goal of Therapy:  Vancomycin trough level 15-20 mcg/ml  Plan:  1.  Will order vancomycin 1500 mg IV x1 -  Last SCr on 6/22, will order stat SCr and reassess 2. Will order meropenem 1 g IV q8h   Rayna Sexton L 12/17/2014,8:48 AM   Addendum: Scr 0.94, CrCl ~ 50 ml/min Will order vancomycin 1250 mg IV q18h Will order trough before 4th overall dose for 6/29 at 1730 SCr in AM to continue to monitor renal function as pt at risk for accumulation - if renal function worsens, will need to decrease dose  Of note, called RN ~30 min after meropenem infusion started. No allergic reactions noted at that time.    Rayna Sexton, PharmD, BCPS Clinical Pharmacist 12/17/2014 3:50 PM

## 2014-12-17 NOTE — Progress Notes (Signed)
[place] at [place] NAME: Laura Gonzalez    MR#:  [id number]  DATE OF BIRTH:  Nov 04, 1940  SUBJECTIVE:  Feels better today. Out in the chair. Still requiring high fio2  REVIEW OF SYSTEMS:   Review of Systems  Constitutional: Negative for fever, chills and weight loss.  HENT: Negative for ear discharge, ear pain and nosebleeds.   Eyes: Negative for blurred vision, pain and discharge.  Respiratory: Positive for shortness of breath. Negative for sputum production, wheezing and stridor.   Cardiovascular: Positive for palpitations. Negative for chest pain, orthopnea and PND.  Gastrointestinal: Positive for nausea and abdominal pain. Negative for vomiting and diarrhea.  Genitourinary: Negative for urgency and frequency.  Musculoskeletal: Negative for back pain and joint pain.  Neurological: Positive for weakness. Negative for sensory change, speech change and focal weakness.  Psychiatric/Behavioral: Negative for depression. The patient is not nervous/anxious.   All other systems reviewed and are negative.  Tolerating Diet:some Tolerating PT: rec rehab  DRUG ALLERGIES:   Allergies  Allergen Reactions  . Amoxicillin Other (See Comments)    Lips swelling, tingling  . Benzocaine-Menthol     Throat swelling  . Biafine [Wound Dressings] Swelling  . Chloraseptic Sore Throat [Acetaminophen] Other (See Comments)    throat swelling  . Fosamax [Alendronate Sodium]   . Neosporin [Neomycin-Bacitracin Zn-Polymyx] Other (See Comments)    Skin redness, puffy  . Other     "chlorotrimitron"-- rxn: throat swelling  . Oxycodone Other (See Comments)    Dizziness, lips tingling  . Statins Other (See Comments)    Muscle weakness, weak  . Tape Other (See Comments)    Skin redness    VITALS:  Blood pressure 119/60, pulse 88, temperature 99.3 F (37.4 C), temperature source Oral, resp. rate 20, height 5\' 5"  (1.651 m), weight 103.874 kg (229 lb),  SpO2 94 %.  PHYSICAL EXAMINATION:   Physical Exam  GENERAL:  74 y.o.-year-old patient lying in the bed with mild to mod distress due to sob obese EYES: Pupils equal, round, reactive to light and accommodation. No scleral icterus. Extraocular muscles intact.  HEENT: Head atraumatic, normocephalic. Oropharynx and nasopharynx clear.  NECK:  Supple, no jugular venous distention. No thyroid enlargement, no tenderness.  LUNGS: decreased breath sounds bilaterally, no wheezing, rales, rhonchi. No use of accessory muscles of respiration.  CARDIOVASCULAR: S1, S2 normal. Irregularly irregular HR No murmurs, rubs, or gallops.  ABDOMEN: Soft, nontender, nondistended. Bowel sounds present. No organomegaly or mass.  EXTREMITIES: No cyanosis, clubbing or edema b/l.    NEUROLOGIC: Cranial nerves II through XII are intact. No focal motor or sensory deficits b/l.   PSYCHIATRIC: The patient is alert and oriented x 3.  SKIN: No obvious rash, lesion, or ulcer.    LABORATORY PANEL:   CBC  Recent Labs Lab 12/12/14 0408 12/17/14 0921  WBC 10.8  --   HGB 14.6 15.2  HCT 44.0  --   PLT 184  --     Chemistries   Recent Labs Lab 12/10/14 1942  12/12/14 0408 12/17/14 0921  NA 141  < > 139  --   K 4.2  < > 3.7  --   CL 100*  < > 101  --   CO2 31  < > 31  --   GLUCOSE 95  < > 119*  --   BUN 22*  < > 16  --   CREATININE 0.77  < > 0.88 0.94  CALCIUM  10.2  < > 9.4  --   AST 20  --   --   --   ALT 15  --   --   --   ALKPHOS 89  --   --   --   BILITOT 0.5  --   --   --   < > = values in this interval not displayed.  Cardiac Enzymes No results for input(s): TROPONINI in the last 168 hours.  RADIOLOGY:  Dg Chest 1 View  12/16/2014   CLINICAL DATA:  Abdominal pain and nausea/diverticulitis. Shortness of breath.  EXAM: CHEST  1 VIEW  COMPARISON:  12/11/2014  FINDINGS: Lungs are moderately hypoinflated with bibasilar opacification suggesting small to moderate bilateral effusions with associated  basilar atelectasis. Cannot completely exclude infection in the lung bases. There is increased opacification in the right hilar/perihilar region. Cardiomediastinal silhouette and remainder of the exam is unchanged.  IMPRESSION: Moderate right hilar/ perihilar opacification which may be due to a pneumonia with evidence of small to moderate bibasilar effusions/ atelectasis.   Electronically Signed   By: Marin Olp M.D.   On: 12/16/2014 10:20   Ct Angio Chest Pe W/cm &/or Wo Cm  12/16/2014   CLINICAL DATA:  Admitted for acute diverticulitis with acute atrial fibrillation with RVR. Acute shortness of breath likely from pulmonary edema. Evaluate for pulmonary embolus. Abnormal chest x-ray.  EXAM: CT ANGIOGRAPHY CHEST WITH CONTRAST  TECHNIQUE: Multidetector CT imaging of the chest was performed using the standard protocol during bolus administration of intravenous contrast. Multiplanar CT image reconstructions and MIPs were obtained to evaluate the vascular anatomy.  CONTRAST:  117mL OMNIPAQUE IOHEXOL 350 MG/ML SOLN  COMPARISON:  Chest x-ray 12/16/2014  FINDINGS: Heart: Heart is mildly enlarged. No Pericardial effusion. A minimal coronary artery disease.  Vascular structures: The pulmonary arteries are well opacified. There is no evidence for acute pulmonary embolus.  Mediastinum/thyroid: The visualized portion of the thyroid gland has a normal appearance. No mediastinal, hilar, or axillary adenopathy.  Lungs/Airways: Bilateral pleural effusions, right greater than left. There is significant consolidation both lungs, involving right middle lobe and both lower lobes.  Upper abdomen: Within the region of the pancreatic tail there is a small cyst measuring 0.8 cm.  Chest wall/osseous structures:  Negative  Review of the MIP images confirms the above findings.  IMPRESSION: 1. Cardiomegaly. 2. Technically adequate exam.  No pulmonary embolus identified. 3. Moderate pleural effusions right greater than left. 4. Bilateral  lung consolidation involving right middle lobe and bilateral lower lobes. 5. Incidentally detected 8 mm cyst within the pancreatic tail, statistically likely benign. Per consensus recommendations, follow-up imaging study is recommended in 1 year, ideally MRI.   Electronically Signed   By: Nolon Nations M.D.   On: 12/16/2014 12:35     ASSESSMENT AND PLAN:  * Rapid A-fib - Acute on chronic, on chronic anticoagulation for this with warfarin. Pharmacy managing anticoagulation.  -pt went back in rapid afib last nite requiring po and IV push cardizem x2 -still HR 125-160 -will transfer to CCU for IV cardizem gtt -Cardiology consult with Dr Josefa Half -Echo today -Repeat CXR ?CHF -on warfarin with therapeutic INR  * Acute hypoxic respiratory failure due to bilateral pneumonia (noted on CT chest) appears HCAP -Vanc and meropenem(allergic to amoxicillin) -improving slowly -Wean oxygen as able -incentive spirometer -appreciate dr Zoila Shutter input  * Sepsis - present on admission likely secondary to diverticulitis. Switch to PO antibiotics, blood cultures neg so far.  * Diverticulitis -  failed outpatient therapy due to unable to tolerate oral antibiotics, we will have her on antiemetics, and change to PO antibiotics as her IV is infiltrated.  * HTN (hypertension) - continue home meds  * GERD (gastroesophageal reflux disease) - continue home dose PPI  * Asthma - continue home inhalers-sats 91-94% ON 4 LITER. CXR no infiltrate or edema  * constipation: on bowel regimen, needs BM. abd films ok -had BM  after enema - prn miralax  *gen weakness -PT recommneds rehab. Pt agreeable  Very slow improvement. Cont ensure. Pt advised to eat a bit more today   Case discussed with Education officer, museum. For Rehab when bed avialable Management plans discussed with the patient, family and they are in agreement.  CODE STATUS: full  DVT Prophylaxis: warfarin  TOTAL CRITICALTIME TAKING CARE OF THIS PATIENT:  40 mins >50% time spent on counselling and coordination of care   Glenice Ciccone M.D on 12/17/2014 at 12:05 PM  Between 7am to 6pm - Pager - [phone]  After 6pm go to [email or web address] - password [email or web address] [facility]  Office  [phone]  CC: Primary care physician; [name], MD

## 2014-12-18 DIAGNOSIS — I481 Persistent atrial fibrillation: Secondary | ICD-10-CM

## 2014-12-18 DIAGNOSIS — A419 Sepsis, unspecified organism: Principal | ICD-10-CM

## 2014-12-18 DIAGNOSIS — J189 Pneumonia, unspecified organism: Secondary | ICD-10-CM

## 2014-12-18 LAB — CREATININE, SERUM
CREATININE: 0.76 mg/dL (ref 0.44–1.00)
GFR calc Af Amer: >60 mL/min (ref >60)
GFR calc non Af Amer: >60 mL/min (ref >60)

## 2014-12-18 LAB — CBC
HEMATOCRIT: 45.9 % (ref 35.0–47.0)
Hemoglobin: 14.8 g/dL (ref 12.0–16.0)
MCH: 29.2 pg (ref 26.0–34.0)
MCHC: 32.2 g/dL (ref 32.0–36.0)
MCV: 90.5 fL (ref 80.0–100.0)
PLATELETS: 320 10*3/uL (ref 150–440)
RBC: 5.07 MIL/uL (ref 3.80–5.20)
RDW: 13.9 % (ref 11.5–14.5)
WBC: 12.6 10*3/uL — AB (ref 3.6–11.0)

## 2014-12-18 LAB — BASIC METABOLIC PANEL
Anion gap: 11 (ref 5–15)
BUN: 28 mg/dL — ABNORMAL HIGH (ref 6–20)
CALCIUM: 9.1 mg/dL (ref 8.9–10.3)
CO2: 33 mmol/L — ABNORMAL HIGH (ref 22–32)
CREATININE: 0.72 mg/dL (ref 0.44–1.00)
Chloride: 92 mmol/L — ABNORMAL LOW (ref 101–111)
GFR calc Af Amer: >60 mL/min (ref >60)
Glucose, Bld: 156 mg/dL — ABNORMAL HIGH (ref 65–99)
Potassium: 2.9 mmol/L — CL (ref 3.5–5.1)
Sodium: 136 mmol/L (ref 135–145)

## 2014-12-18 LAB — PROTIME-INR
INR: 2.18
Prothrombin Time: 24.4 seconds — ABNORMAL HIGH (ref 11.4–15.0)

## 2014-12-18 LAB — MAGNESIUM: Magnesium: 2.1 mg/dL (ref 1.7–2.4)

## 2014-12-18 LAB — POTASSIUM: Potassium: 4 mmol/L (ref 3.5–5.1)

## 2014-12-18 MED ORDER — POTASSIUM CHLORIDE 20 MEQ PO PACK
20.0000 meq | PACK | Freq: Once | ORAL | Status: AC
  Administered 2014-12-18: 20 meq via ORAL
  Filled 2014-12-18: qty 1

## 2014-12-18 MED ORDER — POTASSIUM CHLORIDE 2 MEQ/ML IV SOLN
Freq: Once | INTRAVENOUS | Status: AC
  Administered 2014-12-18: 14:00:00 via INTRAVENOUS
  Filled 2014-12-18: qty 20

## 2014-12-18 MED ORDER — METHYLPREDNISOLONE SODIUM SUCC 40 MG IJ SOLR
40.0000 mg | INTRAMUSCULAR | Status: DC
  Administered 2014-12-18 – 2014-12-19 (×2): 40 mg via INTRAVENOUS
  Filled 2014-12-18 (×2): qty 1

## 2014-12-18 NOTE — Progress Notes (Signed)
PT Cancellation Note  Patient Details Name: Laura Gonzalez MRN: [id number] DOB: 03/09/1941   Cancelled Treatment:    Reason Eval/Treat Not Completed: Patient not medically ready.  Critical potassium and just sent to ICU.  Will await medical disposition.   Ramond Dial 12/18/2014, 1:30 PM   Mee Hives, PT MS Acute Rehab Dept. Number: [facility] [id number] and [place] [phone]

## 2014-12-18 NOTE — Progress Notes (Signed)
[place] at [place] NAME: Laura Gonzalez    MR#:  [id number]  DATE OF BIRTH:  [birth year]  SUBJECTIVE:  Feels better today. Out in the chair. Still requiring high fio2 and now on HFNC, intermittent runs of NSVT  REVIEW OF SYSTEMS:   Review of Systems  Constitutional: Negative for fever, chills and weight loss.  HENT: Negative for ear discharge, ear pain and nosebleeds.   Eyes: Negative for blurred vision, pain and discharge.  Respiratory: Positive for shortness of breath. Negative for sputum production, wheezing and stridor.   Cardiovascular: Negative for chest pain, palpitations, orthopnea and PND.  Gastrointestinal: Negative for nausea, vomiting, abdominal pain and diarrhea.  Genitourinary: Negative for urgency and frequency.  Musculoskeletal: Negative for back pain and joint pain.  Neurological: Positive for weakness. Negative for sensory change, speech change and focal weakness.  Psychiatric/Behavioral: Negative for depression. The patient is not nervous/anxious.   All other systems reviewed and are negative.  Tolerating Diet:some Tolerating PT: rec rehab  DRUG ALLERGIES:   Allergies  Allergen Reactions  . Amoxicillin Other (See Comments)    Lips swelling, tingling  . Benzocaine-Menthol     Throat swelling  . Biafine [Wound Dressings] Swelling  . Chloraseptic Sore Throat [Acetaminophen] Other (See Comments)    throat swelling  . Fosamax [Alendronate Sodium]   . Neosporin [Neomycin-Bacitracin Zn-Polymyx] Other (See Comments)    Skin redness, puffy  . Other     "chlorotrimitron"-- rxn: throat swelling  . Oxycodone Other (See Comments)    Dizziness, lips tingling  . Statins Other (See Comments)    Muscle weakness, weak  . Tape Other (See Comments)    Skin redness    VITALS:  Blood pressure 100/64, pulse 114, temperature 98 F (36.7 C), temperature source Oral, resp. rate 18, height 5\' 5"  (1.651 m), weight 103.42 kg  (228 lb), SpO2 91 %.  PHYSICAL EXAMINATION:   Physical Exam  GENERAL:  74 y.o.-year-old patient lying in the bed with mild to mod distress due to sob obese EYES: Pupils equal, round, reactive to light and accommodation. No scleral icterus. Extraocular muscles intact.  HEENT: Head atraumatic, normocephalic. Oropharynx and nasopharynx clear.  NECK:  Supple, no jugular venous distention. No thyroid enlargement, no tenderness.  LUNGS: decreased breath sounds bilaterally, no wheezing, rales, rhonchi. No use of accessory muscles of respiration.  CARDIOVASCULAR: S1, S2 normal. Irregularly irregular HR No murmurs, rubs, or gallops.  ABDOMEN: Soft, nontender, nondistended. Bowel sounds present. No organomegaly or mass.  EXTREMITIES: No cyanosis, clubbing or edema b/l.    NEUROLOGIC: Cranial nerves II through XII are intact. No focal motor or sensory deficits b/l.   PSYCHIATRIC: alert and oriented x 3.  SKIN: No obvious rash, lesion, or ulcer.   LABORATORY PANEL:   CBC  Recent Labs Lab 12/12/14 0408 12/17/14 0921  WBC 10.8  --   HGB 14.6 15.2  HCT 44.0  --   PLT 184  --     Chemistries   Recent Labs Lab 12/12/14 0408  12/18/14 0342  NA 139  --   --   K 3.7  --   --   CL 101  --   --   CO2 31  --   --   GLUCOSE 119*  --   --   BUN 16  --   --   CREATININE 0.88  < > 0.76  CALCIUM 9.4  --   --   < > =  values in this interval not displayed.  Cardiac Enzymes No results for input(s): TROPONINI in the last 168 hours.  RADIOLOGY:  Dg Chest 1 View  12/16/2014   CLINICAL DATA:  Abdominal pain and nausea/diverticulitis. Shortness of breath.  EXAM: CHEST  1 VIEW  COMPARISON:  12/11/2014  FINDINGS: Lungs are moderately hypoinflated with bibasilar opacification suggesting small to moderate bilateral effusions with associated basilar atelectasis. Cannot completely exclude infection in the lung bases. There is increased opacification in the right hilar/perihilar region. Cardiomediastinal  silhouette and remainder of the exam is unchanged.  IMPRESSION: Moderate right hilar/ perihilar opacification which may be due to a pneumonia with evidence of small to moderate bibasilar effusions/ atelectasis.   Electronically Signed   By: Marin Olp M.D.   On: 12/16/2014 10:20   Ct Angio Chest Pe W/cm &/or Wo Cm  12/16/2014   CLINICAL DATA:  Admitted for acute diverticulitis with acute atrial fibrillation with RVR. Acute shortness of breath likely from pulmonary edema. Evaluate for pulmonary embolus. Abnormal chest x-ray.  EXAM: CT ANGIOGRAPHY CHEST WITH CONTRAST  TECHNIQUE: Multidetector CT imaging of the chest was performed using the standard protocol during bolus administration of intravenous contrast. Multiplanar CT image reconstructions and MIPs were obtained to evaluate the vascular anatomy.  CONTRAST:  135mL OMNIPAQUE IOHEXOL 350 MG/ML SOLN  COMPARISON:  Chest x-ray 12/16/2014  FINDINGS: Heart: Heart is mildly enlarged. No Pericardial effusion. A minimal coronary artery disease.  Vascular structures: The pulmonary arteries are well opacified. There is no evidence for acute pulmonary embolus.  Mediastinum/thyroid: The visualized portion of the thyroid gland has a normal appearance. No mediastinal, hilar, or axillary adenopathy.  Lungs/Airways: Bilateral pleural effusions, right greater than left. There is significant consolidation both lungs, involving right middle lobe and both lower lobes.  Upper abdomen: Within the region of the pancreatic tail there is a small cyst measuring 0.8 cm.  Chest wall/osseous structures:  Negative  Review of the MIP images confirms the above findings.  IMPRESSION: 1. Cardiomegaly. 2. Technically adequate exam.  No pulmonary embolus identified. 3. Moderate pleural effusions right greater than left. 4. Bilateral lung consolidation involving right middle lobe and bilateral lower lobes. 5. Incidentally detected 8 mm cyst within the pancreatic tail, statistically likely  benign. Per consensus recommendations, follow-up imaging study is recommended in 1 year, ideally MRI.   Electronically Signed   By: Nolon Nations M.D.   On: 12/16/2014 12:35    ASSESSMENT AND PLAN:  * Rapid A-fib - Acute on chronic, on chronic anticoagulation for this with warfarin. Pharmacy managing anticoagulation.  -intermittent tachycardia and now on sotalol and cardizem per cardiology -will transfer to step-down for close monitoring -Cardiology consult with Dr Josefa Half appreciated -Echo results pending -on warfarin with therapeutic INR  * Acute hypoxic respiratory failure due to bilateral pneumonia (noted on CT chest) appears HCAP with bilateral pleural effusions (right >left) -Vanc and meropenem(allergic to amoxicillin) -improving slowly -now on HFNC 45% Fio2.  -incentive spirometer -appreciate dr Zoila Shutter input. Will try to get therapeutic thoracentesis once INR is down. Hold coumdain from today. Pt aware risk's from not taking it and agreeable for thoracentesis  * Diverticulitis - failed outpatient therapy due to unable to tolerate oral antibiotics -completed her rx.  * HTN (hypertension) - continue home meds  * GERD (gastroesophageal reflux disease) - continue home dose PPI  * constipation: on bowel regimen, needs BM. abd films ok -had BM  after enema - prn miralax  *gen weakness -PT recommneds rehab. Pt  agreeable -Very slow improvement. Cont ensure.  *D/C planning to rehab once oxygen requirement decreases. Plan B ?LTAC  Case discussed with Education officer, museum. D/w dr Mortimer Fries Management plans discussed with the patient and in agreement.  CODE STATUS: full  DVT Prophylaxis: warfarin (now on hold) TEDS and SCD  TOTAL CRITICALTIME TAKING CARE OF THIS PATIENT: 40 mins >50% time spent on counselling and coordination of care   Teliyah Royal M.D on 12/18/2014 at 8:28 AM  Between 7am to 6pm - Pager - [phone]  After 6pm go to [email or web address] - password [email or web address] [place] [facility]  Office  [phone]  CC: Primary care physician; [name], MD

## 2014-12-18 NOTE — Care Management (Signed)
Patient continues to have difficulty maintaining  adequate 02 sats on nasal cannula.  Is now on HFNC.  Patient is agreeable per attending to consider LTAC.  At present no facility preference.  Initial heads up referral to [facility] for consideration.  Patient is being transferred back to icu

## 2014-12-18 NOTE — Progress Notes (Signed)
RT attempted to obtain sputum after neb treatment.  Patient unable to produce specimen at this time.  Sputum cup left in room and patient is aware to use if able to produce sputum sample.

## 2014-12-18 NOTE — Consult Note (Signed)
Date: 12/18/2014,   MRN# [id number] Laura Gonzalez 08/20/1940 Code Status:     Code Status Orders        Start     Ordered   12/12/14 0301  Full code   Continuous     12/12/14 0301         AdmissionWeight: 240 lb (108.863 kg)                 CurrentWeight: 228 lb (103.42 kg)   EVENTS OVER NIGHT    Acute afib with RVR with SOb, placedon high flow [place], transferred back to step down unit for closer monitoring Patient iniatially admitted for acute sepsis with diverticulitis, now with acute SOb and afib with RVR  Patient alert and awake, but feels SOb, Ct chets c/w b/l consolidation-started IV abx, will start Iv steroids     MEDICATIONS    Home Medication:  No current outpatient prescriptions on file.  Current Medication:   Current facility-administered medications:  .  acetaminophen (TYLENOL) tablet 650 mg, 650 mg, Oral, Q6H PRN, 650 mg at 12/18/14 0332 **OR** acetaminophen (TYLENOL) suppository 650 mg, 650 mg, Rectal, Q6H PRN, Lance Coon, MD .  bisacodyl (DULCOLAX) EC tablet 5 mg, 5 mg, Oral, Daily PRN, Max Sane, MD, 5 mg at 12/16/14 0233 .  budesonide (PULMICORT) nebulizer solution 0.5 mg, 0.5 mg, Nebulization, BID, Flora Lipps, MD, 0.5 mg at 12/18/14 0829 .  cyclobenzaprine (FLEXERIL) tablet 10 mg, 10 mg, Oral, TID PRN, Fritzi Mandes, MD .  diltiazem (CARDIZEM CD) 24 hr capsule 300 mg, 300 mg, Oral, Daily, Fritzi Mandes, MD, 300 mg at 12/18/14 0957 .  docusate sodium (COLACE) capsule 100 mg, 100 mg, Oral, Daily PRN, Max Sane, MD, 100 mg at 12/14/14 1801 .  ezetimibe (ZETIA) tablet 10 mg, 10 mg, Oral, QHS, Fritzi Mandes, MD, 10 mg at 12/17/14 2147 .  feeding supplement (ENSURE ENLIVE) (ENSURE ENLIVE) liquid 237 mL, 237 mL, Oral, TID BM, Fritzi Mandes, MD, 237 mL at 12/17/14 2000 .  hydrochlorothiazide (HYDRODIURIL) tablet 25 mg, 25 mg, Oral, Daily, Lance Coon, MD, 25 mg at 12/18/14 0950 .  HYDROcodone-acetaminophen (NORCO/VICODIN) 5-325 MG per tablet 1-2 tablet, 1-2 tablet,  Oral, Q4H PRN, Fritzi Mandes, MD .  ipratropium-albuterol (DUONEB) 0.5-2.5 (3) MG/3ML nebulizer solution 3 mL, 3 mL, Nebulization, Q4H, Flora Lipps, MD, 3 mL at 12/18/14 0829 .  loratadine (CLARITIN) tablet 10 mg, 10 mg, Oral, Daily, Max Sane, MD, 10 mg at 12/18/14 0951 .  magnesium oxide (MAG-OX) tablet 400 mg, 400 mg, Oral, Daily, Fritzi Mandes, MD, 400 mg at 12/18/14 0951 .  meropenem (MERREM) 1 g in sodium chloride 0.9 % 100 mL IVPB, 1 g, Intravenous, 3 times per day, Fritzi Mandes, MD, 1 g at 12/18/14 0506 .  methylPREDNISolone sodium succinate (SOLU-MEDROL) 40 mg/mL injection 40 mg, 40 mg, Intravenous, Q24H, Flora Lipps, MD, 40 mg at 12/18/14 1026 .  mometasone-formoterol (DULERA) 100-5 MCG/ACT inhaler 2 puff, 2 puff, Inhalation, BID, Lance Coon, MD, 2 puff at 12/18/14 0810 .  montelukast (SINGULAIR) tablet 10 mg, 10 mg, Oral, QHS, Lance Coon, MD, 10 mg at 12/17/14 2147 .  multivitamin with minerals tablet 1 tablet, 1 tablet, Oral, Daily, Fritzi Mandes, MD, 1 tablet at 12/18/14 0950 .  multivitamin-lutein (OCUVITE-LUTEIN) capsule, , Oral, Daily, Fritzi Mandes, MD .  omega-3 acid ethyl esters (LOVAZA) capsule 1 g, 1 g, Oral, Daily, Fritzi Mandes, MD, 1 g at 12/18/14 0951 .  ondansetron (ZOFRAN) tablet 4 mg, 4 mg, Oral, Q6H PRN,  4 mg at 12/13/14 0306 **OR** ondansetron (ZOFRAN) injection 4 mg, 4 mg, Intravenous, Q6H PRN, Lance Coon, MD, 4 mg at 12/16/14 1818 .  sodium chloride 0.9 % injection 3 mL, 3 mL, Intravenous, Q12H, Lance Coon, MD, 3 mL at 12/18/14 0951 .  sotalol (BETAPACE) tablet 80 mg, 80 mg, Oral, Q12H, Isaias Cowman, MD, 80 mg at 12/18/14 0950 .  theophylline (THEODUR) 12 hr tablet 300 mg, 300 mg, Oral, BID, Lance Coon, MD, 300 mg at 12/18/14 0950 .  traMADol (ULTRAM) tablet 50 mg, 50 mg, Oral, Q6H PRN, Fritzi Mandes, MD .  vancomycin (VANCOCIN) 1,250 mg in sodium chloride 0.9 % 250 mL IVPB, 1,250 mg, Intravenous, Q18H, Fritzi Mandes, MD, 1,250 mg at 12/18/14 0501 .  vitamin B-12  (CYANOCOBALAMIN) tablet 1,000 mcg, 1,000 mcg, Oral, Daily, Fritzi Mandes, MD, 1,000 mcg at 12/18/14 0957     ALLERGIES   Amoxicillin; Benzocaine-menthol; Biafine; Chloraseptic sore throat; Fosamax; Neosporin; Other; Oxycodone; Statins; and Tape     REVIEW OF SYSTEMS   Review of Systems  Constitutional: Positive for malaise/fatigue. Negative for fever, chills, weight loss and diaphoresis.  HENT: Negative for hearing loss and tinnitus.   Eyes: Negative for blurred vision, double vision and photophobia.  Respiratory: Positive for shortness of breath. Negative for cough, hemoptysis, sputum production and wheezing.   Cardiovascular: Positive for orthopnea. Negative for chest pain and palpitations.  Gastrointestinal: Negative for heartburn, nausea and vomiting.  Genitourinary: Negative for dysuria, urgency and frequency.  Musculoskeletal: Negative for myalgias and neck pain.  Skin: Negative for itching and rash.  Neurological: Negative for dizziness, tingling, tremors, weakness and headaches.  Endo/Heme/Allergies: Does not bruise/bleed easily.  Psychiatric/Behavioral: Negative for depression and suicidal ideas.     VS: BP 100/64 mmHg  Pulse 114  Temp(Src) 98 F (36.7 C) (Oral)  Resp 18  Ht 5\' 5"  (7.824 m)  Wt 228 lb (103.42 kg)  BMI 37.94 kg/m2  SpO2 91%     PHYSICAL EXAM   Physical Exam  Constitutional: She is oriented to person, place, and time. She appears well-developed and well-nourished. She appears distressed.  HENT:  Head: Normocephalic and atraumatic.  Mouth/Throat: No oropharyngeal exudate.  Eyes: EOM are normal. Pupils are equal, round, and reactive to light. No scleral icterus.  Neck: Normal range of motion. Neck supple.  Cardiovascular: Normal rate, regular rhythm and normal heart sounds.   No murmur heard. Pulmonary/Chest: No stridor. She is in respiratory distress. She has no wheezes. She has rales.  Abdominal: Soft. Bowel sounds are normal. She exhibits no  distension. There is no tenderness. There is no rebound.  Musculoskeletal: Normal range of motion. She exhibits no edema.  Neurological: She is alert and oriented to person, place, and time. She displays normal reflexes. Coordination normal.  Skin: Skin is warm. She is not diaphoretic.  Psychiatric: She has a normal mood and affect.        LABS    Recent Labs     12/16/14  0456  12/17/14  0359  12/17/14  0921  12/18/14  0342  HGB   --    --   15.2   --   CREATININE   --    --   0.94  0.76  INR  3.16  2.85   --   2.18  ,    No results for input(s): PH in the last 72 hours.  Invalid input(s): PCO2, PO2, BASEEXCESS, BASEDEFICITE, TFT    CULTURE RESULTS   Recent Results (from  the past 240 hour(s))  MRSA PCR Screening     Status: None   Collection Time: 12/11/14  4:50 AM  Result Value Ref Range Status   MRSA by PCR NEGATIVE NEGATIVE Final    Comment:        The GeneXpert MRSA Assay (FDA approved for NASAL specimens only), is one component of a comprehensive MRSA colonization surveillance program. It is not intended to diagnose MRSA infection nor to guide or monitor treatment for MRSA infections.   Culture, blood (routine x 2)     Status: None   Collection Time: 12/12/14  1:10 AM  Result Value Ref Range Status   Specimen Description BLOOD  Final   Special Requests NONE  Final   Culture NO GROWTH 5 DAYS  Final   Report Status 12/17/2014 FINAL  Final  Culture, blood (routine x 2)     Status: None   Collection Time: 12/12/14  1:10 AM  Result Value Ref Range Status   Specimen Description BLOOD  Final   Special Requests NONE  Final   Culture NO GROWTH 5 DAYS  Final   Report Status 12/17/2014 FINAL  Final  C difficile quick scan w PCR reflex ([facility] only)     Status: None   Collection Time: 12/17/14  8:59 PM  Result Value Ref Range Status   C Diff antigen NEGATIVE  Final   C Diff toxin NEGATIVE  Final   C Diff interpretation Negative for C. difficile  Final           IMAGING    No results found.          ASSESSMENT/PLAN   74 yo morbidly obese white female with acute diverticulitis with acute Afib with RVR with acute SOB likley from acute pulm edema and HCAP  pneumonia  1.oxygen as needed 2.continue iv abx as prescribed, obtain sputum samples 3.started dounebs and pulmicort nebs 4.lasix as tolerated, consider thoracentesis 5.follow up cardiology recs-check ECHO  I have personally obtained a history, examined the patient, evaluated laboratory and imaging results, formulated the assessment and plan and placed orders.  The Patient requires high complexity decision making for assessment and support, frequent evaluation and titration of therapies, application of advanced monitoring technologies and extensive interpretation of multiple databases.   Time spent 40 mins  Corrin Parker, M.D. [facility] [place] Director Intensive Care Unit

## 2014-12-18 NOTE — Care Management Note (Signed)
Case Management Note  Patient Details  Name: Laura Gonzalez MRN: [id number] Date of Birth: Feb 02, 1941  Subjective/Objective:    HR 145-150. HFNC at 45%. Steroids, IV antibiotics. Plan is therapeutic thoracentesis when INR down, (2.18)               Action/Plan: SNF when medically stable.  Expected Discharge Date:                  Expected Discharge Plan:  [place]  In-House Referral:  Clinical Social Work  Discharge planning Services     Post Acute Care Choice:    Choice offered to:     DME Arranged:    DME Agency:     HH Arranged:    [place] Agency:     Status of Service:     Medicare Important Message Given:  Yes-second notification given Date Medicare IM Given:  12/12/14 Medicare IM give by:  Shelbie Ammons RN MSN Date Additional Medicare IM Given:  12/14/14 Additional Medicare Important Message give by:  Orvan July  If discussed at Long Length of Stay Meetings, dates discussed:    Additional Comments:  Jolly Mango, RN 12/18/2014, 9:25 AM

## 2014-12-19 ENCOUNTER — Inpatient Hospital Stay: Payer: Medicare Other

## 2014-12-19 LAB — BASIC METABOLIC PANEL
Anion gap: 6 (ref 5–15)
BUN: 27 mg/dL — ABNORMAL HIGH (ref 6–20)
CHLORIDE: 96 mmol/L — AB (ref 101–111)
CO2: 34 mmol/L — AB (ref 22–32)
Calcium: 9 mg/dL (ref 8.9–10.3)
Creatinine, Ser: 0.56 mg/dL (ref 0.44–1.00)
GFR calc non Af Amer: >60 mL/min (ref >60)
Glucose, Bld: 130 mg/dL — ABNORMAL HIGH (ref 65–99)
POTASSIUM: 3.2 mmol/L — AB (ref 3.5–5.1)
SODIUM: 136 mmol/L (ref 135–145)

## 2014-12-19 LAB — BODY FLUID CELL COUNT WITH DIFFERENTIAL
EOS FL: 0 %
LYMPHS FL: 13 %
MONOCYTE-MACROPHAGE-SEROUS FLUID: 49 %
NEUTROPHIL FLUID: 38 %
Other Cells, Fluid: 0 %
WBC FLUID: 11081 uL

## 2014-12-19 LAB — CBC
HCT: 40.7 % (ref 35.0–47.0)
Hemoglobin: 13.9 g/dL (ref 12.0–16.0)
MCH: 30.4 pg (ref 26.0–34.0)
MCHC: 34.1 g/dL (ref 32.0–36.0)
MCV: 89.2 fL (ref 80.0–100.0)
PLATELETS: 319 10*3/uL (ref 150–440)
RBC: 4.56 MIL/uL (ref 3.80–5.20)
RDW: 13.7 % (ref 11.5–14.5)
WBC: 10.6 10*3/uL (ref 3.6–11.0)

## 2014-12-19 LAB — PROTEIN, BODY FLUID: Total protein, fluid: 3.6 g/dL

## 2014-12-19 LAB — PHOSPHORUS
Phosphorus: 2.2 mg/dL — ABNORMAL LOW (ref 2.5–4.6)
Phosphorus: 3.1 mg/dL (ref 2.5–4.6)

## 2014-12-19 LAB — VANCOMYCIN, TROUGH: VANCOMYCIN TR: 8 ug/mL — AB (ref 10–20)

## 2014-12-19 LAB — PROTIME-INR
INR: 2
Prothrombin Time: 22.8 seconds — ABNORMAL HIGH (ref 11.4–15.0)

## 2014-12-19 LAB — POTASSIUM: POTASSIUM: 3.9 mmol/L (ref 3.5–5.1)

## 2014-12-19 LAB — MAGNESIUM: Magnesium: 2 mg/dL (ref 1.7–2.4)

## 2014-12-19 MED ORDER — VANCOMYCIN HCL 10 G IV SOLR
1250.0000 mg | Freq: Two times a day (BID) | INTRAVENOUS | Status: DC
  Administered 2014-12-19 – 2014-12-20 (×2): 1250 mg via INTRAVENOUS
  Filled 2014-12-19 (×4): qty 1250

## 2014-12-19 MED ORDER — POTASSIUM PHOSPHATES 15 MMOLE/5ML IV SOLN
20.0000 mmol | Freq: Once | INTRAVENOUS | Status: AC
  Administered 2014-12-19: 20 mmol via INTRAVENOUS
  Filled 2014-12-19: qty 6.67

## 2014-12-19 MED ORDER — IPRATROPIUM-ALBUTEROL 0.5-2.5 (3) MG/3ML IN SOLN
3.0000 mL | Freq: Four times a day (QID) | RESPIRATORY_TRACT | Status: DC
  Administered 2014-12-20 (×2): 3 mL via RESPIRATORY_TRACT
  Filled 2014-12-19 (×2): qty 3

## 2014-12-19 NOTE — Progress Notes (Signed)
Physical Therapy Treatment Patient Details Name: Laura Gonzalez MRN: [id number] DOB: 1941-04-19 Today's Date: 12/19/2014    History of Present Illness Kalaya Infantino is a 74 y.o. female who presents with diverticulitis. Patient came to the ED yesterday with several days complaint of left lower quadrant pain and was found on imaging to have diverticulitis. She was sent home with oral antibiotics. She returned today after spiking several fevers, having shaking chills, and nausea and vomiting to the point that she was not able to keep down her by mouth medications. She was tachycardic on arrival here. Hospitalists were called for admission for sepsis secondary to her diverticulitis, and for diverticulitis having failed outpatient therapy.    PT Comments    Pt is primarily limited at this time due to cardiopulmonary status. She is able to ambulate with non-rebreather at 15L/min but occasionally desaturates to 86% requiring standing rest breaks to recover. Pt demonstrates reasonable strength and stability with mobility and transfers. Pt will benefit from skilled PT services to address deficits in strength, balance, and mobility in order to return to full function at home.     Follow Up Recommendations  SNF     Equipment Recommendations  Rolling walker with 5" wheels    Recommendations for Other Services       Precautions / Restrictions Precautions Precautions: Fall Restrictions Weight Bearing Restrictions: No    Mobility  Bed Mobility Overal bed mobility: Needs Assistance Bed Mobility: Supine to Sit     Supine to sit: Min assist     General bed mobility comments: Requires a single arm to pull self up to sitting.   Transfers Overall transfer level: Needs assistance Equipment used: Rolling walker (2 wheeled) Transfers: Sit to/from Stand Sit to Stand: Min guard         General transfer comment: Good strength noted although slightly increased time required to  perform  Ambulation/Gait Ambulation/Gait assistance: Min guard Ambulation Distance (Feet): 120 Feet Assistive device: Rolling walker (2 wheeled) Gait Pattern/deviations: Step-through pattern;Decreased step length - right;Decreased step length - left Gait velocity: Pt unsteady on fet, c/o lightheadedness unrelenting.  Gait velocity interpretation: <1.8 ft/sec, indicative of risk for recurrent falls General Gait Details: Pt ambulated in hallway outside room and near RN station. +2 required to assist with equipment. Once upright at EOB prior to ambulation pt unable to keep SaO2 above 88%. RT placed pt on nonrebreather at 15 L/min for rest of physical therapy session. Vitals continuously monitored throughout session. SaO2 intermittently drops to 86% and pt requires standing rest breaks with slow controlled breathing for SaO2 to rebound to >90%.   Stairs            Wheelchair Mobility    Modified Rankin (Stroke Patients Only)       Balance     Sitting balance-Leahy Scale: Normal       Standing balance-Leahy Scale: Fair                      Cognition Arousal/Alertness: Awake/alert Behavior During Therapy: WFL for tasks assessed/performed Overall Cognitive Status: Within Functional Limits for tasks assessed                      Exercises General Exercises - Lower Extremity Long Arc Quad: Strengthening;Both;15 reps;Seated Heel Slides: Strengthening;Both;15 reps;Seated Hip ABduction/ADduction: Strengthening;Both;15 reps;Seated Hip Flexion/Marching: Strengthening;Both;Seated;10 reps Heel Raises: Strengthening;Both;15 reps;Seated    General Comments        Pertinent Vitals/Pain Pain Assessment:  0-10 Pain Score: 5  Pain Location: IV site Pain Intervention(s): Monitored during session    Home Living                      Prior Function            PT Goals (current goals can now be found in the care plan section) Acute Rehab PT  Goals Patient Stated Goal: Return to PLOF in ADL, IADL. PT Goal Formulation: With patient Time For Goal Achievement: 12/29/14 Potential to Achieve Goals: Good Progress towards PT goals: Progressing toward goals    Frequency  Min 2X/week    PT Plan Current plan remains appropriate    Co-evaluation             End of Session Equipment Utilized During Treatment: Oxygen;Gait belt Activity Tolerance: Patient limited by fatigue (Cardiopulmonary endurance) Patient left: in chair;with call bell/phone within reach     Time: 1050-1129 PT Time Calculation (min) (ACUTE ONLY): 39 min  Charges:  $Gait Training: 23-37 mins $Therapeutic Exercise: 8-22 mins                    G Codes:      Lyndel Safe Samie Barclift PT, DPT   Fount Bahe 12/19/2014, 1:29 PM

## 2014-12-19 NOTE — Consult Note (Signed)
Subjective: Patient seen for diverticulitis.  Patient feeling much better, not short of breath today.  No chest or abdominal -pain. Tolerating regular diet.  bm still loose but improving.   Objective: Vital signs in last 24 hours: Temp:  [97.8 F (36.6 C)-98.3 F (36.8 C)] 97.8 F (36.6 C) (06/29 0500) Pulse Rate:  [63-91] 72 (06/29 1000) Resp:  [17-33] 27 (06/29 1000) BP: (71-127)/(32-111) 114/55 mmHg (06/29 1000) SpO2:  [88 %-93 %] 92 % (06/29 1139) FiO2 (%):  [42 %-45 %] 42 % (06/29 0737) Blood pressure 114/55, pulse 72, temperature 97.8 F (36.6 C), temperature source Oral, resp. rate 27, height 5\' 5"  (1.651 m), weight 103.42 kg (228 lb), SpO2 92 %.   Intake/Output from previous day: 06/28 0701 - 06/29 0700 In: 1200 [IV Piggyback:1200] Out: 750 [Urine:750]  Intake/Output this shift:     General appearance:  nad Resp: cta Cardio: IRR GI:  Soft non-tender, non-distended, bowel sounds positive, normoactive.  Extremities: no cce   Lab Results: Results for orders placed or performed during the hospital encounter of 12/11/14 (from the past 24 hour(s))  Potassium     Status: None   Collection Time: 12/18/14  8:35 PM  Result Value Ref Range   Potassium 4.0 3.5 - 5.1 mmol/L  Protime-INR     Status: Abnormal   Collection Time: 12/19/14  5:27 AM  Result Value Ref Range   Prothrombin Time 22.8 (H) 11.4 - 15.0 seconds   INR 8.78   Basic metabolic panel     Status: Abnormal   Collection Time: 12/19/14  5:27 AM  Result Value Ref Range   Sodium 136 135 - 145 mmol/L   Potassium 3.2 (L) 3.5 - 5.1 mmol/L   Chloride 96 (L) 101 - 111 mmol/L   CO2 34 (H) 22 - 32 mmol/L   Glucose, Bld 130 (H) 65 - 99 mg/dL   BUN 27 (H) 6 - 20 mg/dL   Creatinine, Ser 0.56 0.44 - 1.00 mg/dL   Calcium 9.0 8.9 - 10.3 mg/dL   GFR calc non Af Amer >60 >60 mL/min   GFR calc Af Amer >60 >60 mL/min   Anion gap 6 5 - 15  Magnesium     Status: None   Collection Time: 12/19/14  5:27 AM  Result Value Ref  Range   Magnesium 2.0 1.7 - 2.4 mg/dL  Phosphorus     Status: Abnormal   Collection Time: 12/19/14  5:27 AM  Result Value Ref Range   Phosphorus 2.2 (L) 2.5 - 4.6 mg/dL  CBC     Status: None   Collection Time: 12/19/14  5:27 AM  Result Value Ref Range   WBC 10.6 3.6 - 11.0 K/uL   RBC 4.56 3.80 - 5.20 MIL/uL   Hemoglobin 13.9 12.0 - 16.0 g/dL   HCT 40.7 35.0 - 47.0 %   MCV 89.2 80.0 - 100.0 fL   MCH 30.4 26.0 - 34.0 pg   MCHC 34.1 32.0 - 36.0 g/dL   RDW 13.7 11.5 - 14.5 %   Platelets 319 150 - 440 K/uL      Recent Labs  12/17/14 0921 12/18/14 1044 12/19/14 0527  WBC  --  12.6* 10.6  HGB 15.2 14.8 13.9  HCT  --  45.9 40.7  PLT  --  320 319   BMET  Recent Labs  12/18/14 0342 12/18/14 1044 12/18/14 2035 12/19/14 0527  NA  --  136  --  136  K  --  2.9* 4.0  3.2*  CL  --  92*  --  96*  CO2  --  33*  --  34*  GLUCOSE  --  156*  --  130*  BUN  --  28*  --  27*  CREATININE 0.76 0.72  --  0.56  CALCIUM  --  9.1  --  9.0   LFT No results for input(s): PROT, ALBUMIN, AST, ALT, ALKPHOS, BILITOT, BILIDIR, IBILI in the last 72 hours. PT/INR  Recent Labs  12/18/14 0342 12/19/14 0527  LABPROT 24.4* 22.8*  INR 2.18 2.00   Hepatitis Panel No results for input(s): HEPBSAG, HCVAB, HEPAIGM, HEPBIGM in the last 72 hours. C-Diff  Recent Labs  12/17/14 2059  CDIFFTOX NEGATIVE   No results for input(s): CDIFFPCR in the last 72 hours.   Studies/Results: No results found.  Scheduled Inpatient Medications:   . budesonide (PULMICORT) nebulizer solution  0.5 mg Nebulization BID  . diltiazem  300 mg Oral Daily  . ezetimibe  10 mg Oral QHS  . feeding supplement (ENSURE ENLIVE)  237 mL Oral TID BM  . hydrochlorothiazide  25 mg Oral Daily  . ipratropium-albuterol  3 mL Nebulization Q4H  . loratadine  10 mg Oral Daily  . magnesium oxide  400 mg Oral Daily  . meropenem (MERREM) IV  1 g Intravenous 3 times per day  . methylPREDNISolone (SOLU-MEDROL) injection  40 mg  Intravenous Q24H  . mometasone-formoterol  2 puff Inhalation BID  . montelukast  10 mg Oral QHS  . multivitamin with minerals  1 tablet Oral Daily  . multivitamin-lutein   Oral Daily  . omega-3 acid ethyl esters  1 g Oral Daily  . potassium phosphate IVPB (mmol)  20 mmol Intravenous Once  . sodium chloride  3 mL Intravenous Q12H  . sotalol  80 mg Oral Q12H  . theophylline  300 mg Oral BID  . vancomycin  1,250 mg Intravenous Q18H  . vitamin B-12  1,000 mcg Oral Daily    Continuous Inpatient Infusions:     PRN Inpatient Medications:  acetaminophen **OR** acetaminophen, bisacodyl, cyclobenzaprine, docusate sodium, HYDROcodone-acetaminophen, ondansetron **OR** ondansetron (ZOFRAN) IV, traMADol  Miscellaneous:   Assessment:  1) diverticulitis. Currently on vancomycin and meropenem. 2) AR-rate controlled 3) pna-improving.   Plan:  1) no new  GI recs.  Will need to have colonoscopy in about a month, hopefully resp sx will continue to improve.  O/P fu with GI in a couple weeks. Will s/o, reconsult if needed.   Lollie Sails MD 12/19/2014, 1:27 PM

## 2014-12-19 NOTE — Care Management Note (Signed)
Case Management Note  Patient Details  Name: Laura Gonzalez MRN: [id number] Date of Birth: 1941-03-30  Subjective/Objective:  [facility] in to evaluate patient. Patient does not meet criteria at this time for [facility] transfer due to medicare required  billing codes. If patient is in ICU for 3 midnights, she will be eligible for reevaluation.                   Action/Plan:   Expected Discharge Date:                  Expected Discharge Plan:  [place]  In-House Referral:  Clinical Social Work  Discharge planning Services     Post Acute Care Choice:    Choice offered to:     DME Arranged:    DME Agency:     HH Arranged:    [place] Agency:     Status of Service:     Medicare Important Message Given:  Yes-second notification given Date Medicare IM Given:  12/12/14 Medicare IM give by:  Shelbie Ammons RN MSN Date Additional Medicare IM Given:  12/14/14 Additional Medicare Important Message give by:  Orvan July  If discussed at Long Length of Stay Meetings, dates discussed:    Additional Comments:  Jolly Mango, RN 12/19/2014, 1:41 PM

## 2014-12-19 NOTE — Consult Note (Signed)
Date: 12/19/2014,   MRN# [id number] Laura Gonzalez [birth year] Code Status:     Code Status Orders        Start     Ordered   12/12/14 0301  Full code   Continuous     12/12/14 0301         AdmissionWeight: 240 lb (108.863 kg)                 CurrentWeight: 228 lb (103.42 kg)   EVENTS OVER NIGHT    Acute afib with RVR with SOb, placed on high flow [place], transferred back to step down unit for closer monitoring Patient iniatially admitted for acute sepsis with diverticulitis, now with acute SOb and afib with RVR-feels better today after starting IV steorids  Patient alert and awake,Ct chets c/w b/l consolidation-started IV abx, plan for thoracentesis today    MEDICATIONS    Home Medication:  No current outpatient prescriptions on file.  Current Medication:   Current facility-administered medications:  .  acetaminophen (TYLENOL) tablet 650 mg, 650 mg, Oral, Q6H PRN, 650 mg at 12/18/14 0332 **OR** acetaminophen (TYLENOL) suppository 650 mg, 650 mg, Rectal, Q6H PRN, Lance Coon, MD .  bisacodyl (DULCOLAX) EC tablet 5 mg, 5 mg, Oral, Daily PRN, Max Sane, MD, 5 mg at 12/16/14 0233 .  budesonide (PULMICORT) nebulizer solution 0.5 mg, 0.5 mg, Nebulization, BID, Flora Lipps, MD, 0.5 mg at 12/19/14 0736 .  cyclobenzaprine (FLEXERIL) tablet 10 mg, 10 mg, Oral, TID PRN, Fritzi Mandes, MD .  diltiazem (CARDIZEM CD) 24 hr capsule 300 mg, 300 mg, Oral, Daily, Fritzi Mandes, MD, 300 mg at 12/19/14 0848 .  docusate sodium (COLACE) capsule 100 mg, 100 mg, Oral, Daily PRN, Max Sane, MD, 100 mg at 12/14/14 1801 .  ezetimibe (ZETIA) tablet 10 mg, 10 mg, Oral, QHS, Fritzi Mandes, MD, 10 mg at 12/18/14 2113 .  feeding supplement (ENSURE ENLIVE) (ENSURE ENLIVE) liquid 237 mL, 237 mL, Oral, TID BM, Fritzi Mandes, MD, 237 mL at 12/19/14 0856 .  hydrochlorothiazide (HYDRODIURIL) tablet 25 mg, 25 mg, Oral, Daily, Lance Coon, MD, 25 mg at 12/19/14 0851 .  HYDROcodone-acetaminophen (NORCO/VICODIN) 5-325 MG per  tablet 1-2 tablet, 1-2 tablet, Oral, Q4H PRN, Fritzi Mandes, MD .  ipratropium-albuterol (DUONEB) 0.5-2.5 (3) MG/3ML nebulizer solution 3 mL, 3 mL, Nebulization, Q4H, Flora Lipps, MD, 3 mL at 12/19/14 0735 .  loratadine (CLARITIN) tablet 10 mg, 10 mg, Oral, Daily, Max Sane, MD, 10 mg at 12/19/14 0851 .  magnesium oxide (MAG-OX) tablet 400 mg, 400 mg, Oral, Daily, Fritzi Mandes, MD, 400 mg at 12/19/14 0848 .  meropenem (MERREM) 1 g in sodium chloride 0.9 % 100 mL IVPB, 1 g, Intravenous, 3 times per day, Fritzi Mandes, MD, 1 g at 12/19/14 0528 .  methylPREDNISolone sodium succinate (SOLU-MEDROL) 40 mg/mL injection 40 mg, 40 mg, Intravenous, Q24H, Flora Lipps, MD, 40 mg at 12/18/14 1026 .  mometasone-formoterol (DULERA) 100-5 MCG/ACT inhaler 2 puff, 2 puff, Inhalation, BID, Lance Coon, MD, 2 puff at 12/19/14 236 160 8112 .  montelukast (SINGULAIR) tablet 10 mg, 10 mg, Oral, QHS, Lance Coon, MD, 10 mg at 12/18/14 2113 .  multivitamin with minerals tablet 1 tablet, 1 tablet, Oral, Daily, Fritzi Mandes, MD, 1 tablet at 12/19/14 0847 .  multivitamin-lutein (OCUVITE-LUTEIN) capsule, , Oral, Daily, Fritzi Mandes, MD .  omega-3 acid ethyl esters (LOVAZA) capsule 1 g, 1 g, Oral, Daily, Fritzi Mandes, MD, 1 g at 12/19/14 0847 .  ondansetron (ZOFRAN) tablet 4 mg, 4 mg, Oral,  Q6H PRN, 4 mg at 12/13/14 0306 **OR** ondansetron (ZOFRAN) injection 4 mg, 4 mg, Intravenous, Q6H PRN, Lance Coon, MD, 4 mg at 12/16/14 1818 .  sodium chloride 0.9 % injection 3 mL, 3 mL, Intravenous, Q12H, Lance Coon, MD, 3 mL at 12/19/14 0856 .  sotalol (BETAPACE) tablet 80 mg, 80 mg, Oral, Q12H, Isaias Cowman, MD, 80 mg at 12/19/14 0853 .  theophylline (THEODUR) 12 hr tablet 300 mg, 300 mg, Oral, BID, Lance Coon, MD, 300 mg at 12/19/14 0848 .  traMADol (ULTRAM) tablet 50 mg, 50 mg, Oral, Q6H PRN, Fritzi Mandes, MD .  vancomycin (VANCOCIN) 1,250 mg in sodium chloride 0.9 % 250 mL IVPB, 1,250 mg, Intravenous, Q18H, Fritzi Mandes, MD, 1,250 mg at 12/19/14  0048 .  vitamin B-12 (CYANOCOBALAMIN) tablet 1,000 mcg, 1,000 mcg, Oral, Daily, Fritzi Mandes, MD, 1,000 mcg at 12/19/14 0851     ALLERGIES   Amoxicillin; Benzocaine-menthol; Biafine; Chloraseptic sore throat; Fosamax; Neosporin; Other; Oxycodone; Statins; and Tape     REVIEW OF SYSTEMS   Review of Systems  Constitutional: Positive for malaise/fatigue. Negative for fever, chills, weight loss and diaphoresis.  HENT: Negative for hearing loss and tinnitus.   Eyes: Negative for blurred vision, double vision and photophobia.  Respiratory: Positive for shortness of breath. Negative for cough, hemoptysis, sputum production and wheezing.   Cardiovascular: Positive for orthopnea. Negative for chest pain and palpitations.  Gastrointestinal: Negative for heartburn, nausea and vomiting.  Genitourinary: Negative for dysuria, urgency and frequency.  Musculoskeletal: Negative for myalgias and neck pain.  Skin: Negative for itching and rash.  Neurological: Negative for dizziness, tingling, tremors, weakness and headaches.  Endo/Heme/Allergies: Does not bruise/bleed easily.  Psychiatric/Behavioral: Negative for depression and suicidal ideas.     VS: BP 112/100 mmHg  Pulse 70  Temp(Src) 97.8 F (36.6 C) (Oral)  Resp 20  Ht 5\' 5"  (1.651 m)  Wt 228 lb (103.42 kg)  BMI 37.94 kg/m2  SpO2 92%     PHYSICAL EXAM   Physical Exam  Constitutional: She is oriented to person, place, and time. She appears well-developed and well-nourished. No distress.  HENT:  Head: Normocephalic and atraumatic.  Mouth/Throat: No oropharyngeal exudate.  Eyes: EOM are normal. Pupils are equal, round, and reactive to light. No scleral icterus.  Neck: Normal range of motion. Neck supple.  Cardiovascular: Normal rate, regular rhythm and normal heart sounds.   No murmur heard. Pulmonary/Chest: No stridor. No respiratory distress. She has no wheezes. She has rales.  Abdominal: Soft. Bowel sounds are normal. She  exhibits no distension. There is no tenderness. There is no rebound.  Musculoskeletal: Normal range of motion. She exhibits no edema.  Neurological: She is alert and oriented to person, place, and time. She displays normal reflexes. Coordination normal.  Skin: Skin is warm. She is not diaphoretic.  Psychiatric: She has a normal mood and affect.        LABS    Recent Labs     12/17/14  0359  12/17/14  0921  12/18/14  0342  12/18/14  1044  12/19/14  0527  HGB   --   15.2   --   14.8  13.9  HCT   --    --    --   45.9  40.7  MCV   --    --    --   90.5  89.2  WBC   --    --    --   12.6*  10.6  BUN   --    --    --  28*  27*  CREATININE   --   0.94  0.76  0.72  0.56  GLUCOSE   --    --    --   156*  130*  CALCIUM   --    --    --   9.1  9.0  INR  2.85   --   2.18   --   2.00  ,    No results for input(s): PH in the last 72 hours.  Invalid input(s): PCO2, PO2, BASEEXCESS, BASEDEFICITE, TFT    CULTURE RESULTS   Recent Results (from the past 240 hour(s))  MRSA PCR Screening     Status: None   Collection Time: 12/11/14  4:50 AM  Result Value Ref Range Status   MRSA by PCR NEGATIVE NEGATIVE Final    Comment:        The GeneXpert MRSA Assay (FDA approved for NASAL specimens only), is one component of a comprehensive MRSA colonization surveillance program. It is not intended to diagnose MRSA infection nor to guide or monitor treatment for MRSA infections.   Culture, blood (routine x 2)     Status: None   Collection Time: 12/12/14  1:10 AM  Result Value Ref Range Status   Specimen Description BLOOD  Final   Special Requests NONE  Final   Culture NO GROWTH 5 DAYS  Final   Report Status 12/17/2014 FINAL  Final  Culture, blood (routine x 2)     Status: None   Collection Time: 12/12/14  1:10 AM  Result Value Ref Range Status   Specimen Description BLOOD  Final   Special Requests NONE  Final   Culture NO GROWTH 5 DAYS  Final   Report Status 12/17/2014 FINAL   Final  C difficile quick scan w PCR reflex ([facility] only)     Status: None   Collection Time: 12/17/14  8:59 PM  Result Value Ref Range Status   C Diff antigen NEGATIVE  Final   C Diff toxin NEGATIVE  Final   C Diff interpretation Negative for C. difficile  Final          IMAGING    No results found.          ASSESSMENT/PLAN   74 yo morbidly obese white female with acute diverticulitis with acute Afib with RVR with acute SOB likley from acute pulm edema and HCAP  pneumonia  1.oxygen as needed 2.continue iv abx as prescribed, obtain sputum samples 3.started dounebs and pulmicort nebs, continue iv steroids 4.lasix as tolerated, plan for  thoracentesis 5.follow up cardiology recs-ECHO pending  I have personally obtained a history, examined the patient, evaluated laboratory and imaging results, formulated the assessment and plan and placed orders.  The Patient requires high complexity decision making for assessment and support, frequent evaluation and titration of therapies, application of advanced monitoring technologies and extensive interpretation of multiple databases.   Time spent 40 mins  Corrin Parker, M.D. Pulmonary & [place] Director Intensive Care Unit

## 2014-12-19 NOTE — Care Management (Signed)
Important Message  Patient Details  Name: Laura Gonzalez MRN: [id number] Date of Birth: 03-May-1941   Medicare Important Message Given:  Yes-third notification given    Juliann Pulse A Allmond 12/19/2014, 2:07 PM

## 2014-12-19 NOTE — Progress Notes (Signed)
Laura Gonzalez weaned off HFNC and is now on 6L nasal cannula. Thoracentesis performed today. 334ml removed.

## 2014-12-19 NOTE — Progress Notes (Signed)
[place] at [place] NAME: Laura Gonzalez    MR#:  [id number]  DATE OF BIRTH:  1940-12-12  SUBJECTIVE:  Feels better today. Out in the chair. Still requiring high fio2 and now on HFNC, intermittent runs of NSVT  REVIEW OF SYSTEMS:   Review of Systems  Constitutional: Negative for fever, chills and weight loss.  HENT: Negative for ear discharge, ear pain and nosebleeds.   Eyes: Negative for blurred vision, pain and discharge.  Respiratory: Positive for shortness of breath. Negative for sputum production, wheezing and stridor.   Cardiovascular: Negative for chest pain, palpitations, orthopnea and PND.  Gastrointestinal: Negative for nausea, vomiting, abdominal pain and diarrhea.  Genitourinary: Negative for urgency and frequency.  Musculoskeletal: Negative for back pain and joint pain.  Neurological: Positive for weakness. Negative for sensory change, speech change and focal weakness.  Psychiatric/Behavioral: Negative for depression. The patient is not nervous/anxious.   All other systems reviewed and are negative.  Tolerating Diet:some Tolerating PT: rec rehab  DRUG ALLERGIES:   Allergies  Allergen Reactions  . Amoxicillin Other (See Comments)    Lips swelling, tingling  . Benzocaine-Menthol     Throat swelling  . Biafine [Wound Dressings] Swelling  . Chloraseptic Sore Throat [Acetaminophen] Other (See Comments)    throat swelling  . Fosamax [Alendronate Sodium]   . Neosporin [Neomycin-Bacitracin Zn-Polymyx] Other (See Comments)    Skin redness, puffy  . Other     "chlorotrimitron"-- rxn: throat swelling  . Oxycodone Other (See Comments)    Dizziness, lips tingling  . Statins Other (See Comments)    Muscle weakness, weak  . Tape Other (See Comments)    Skin redness    VITALS:  Blood pressure 114/55, pulse 72, temperature 97.8 F (36.6 C), temperature source Oral, resp. rate 27, height 5\' 5"  (1.651 m), weight 103.42 kg  (228 lb), SpO2 89 %.  PHYSICAL EXAMINATION:   Physical Exam  GENERAL:  74 y.o.-year-old patient lying in the bed with mild to mod distress due to sob obese EYES: Pupils equal, round, reactive to light and accommodation. No scleral icterus. Extraocular muscles intact.  HEENT: Head atraumatic, normocephalic. Oropharynx and nasopharynx clear.  NECK:  Supple, no jugular venous distention. No thyroid enlargement, no tenderness.  LUNGS: decreased breath sounds bilaterally, no wheezing, rales, rhonchi. No use of accessory muscles of respiration.  CARDIOVASCULAR: S1, S2 normal. Irregularly irregular HR No murmurs, rubs, or gallops.  ABDOMEN: Soft, nontender, nondistended. Bowel sounds present. No organomegaly or mass.  EXTREMITIES: No cyanosis, clubbing or edema b/l.    NEUROLOGIC: Cranial nerves II through XII are intact. No focal motor or sensory deficits b/l.   PSYCHIATRIC: alert and oriented x 3.  SKIN: No obvious rash, lesion, or ulcer.   LABORATORY PANEL:   CBC  Recent Labs Lab 12/19/14 0527  WBC 10.6  HGB 13.9  HCT 40.7  PLT 319    Chemistries   Recent Labs Lab 12/19/14 0527  NA 136  K 3.2*  CL 96*  CO2 34*  GLUCOSE 130*  BUN 27*  CREATININE 0.56  CALCIUM 9.0  MG 2.0   ASSESSMENT AND PLAN:  * Rapid A-fib - Acute on chronic, on chronic anticoagulation for this with warfarin. - Pharmacy managing anticoagulation.  -intermittent tachycardia and  on sotalol and cardizem per cardiology -Cardiology consult with Dr Josefa Half appreciated -Echo results pending -on warfarin with therapeutic INR  * Acute hypoxic respiratory failure due to bilateral pneumonia (noted on  CT chest) appears HCAP with bilateral pleural effusions (right >left) -Vanc and meropenem(allergic to amoxicillin) -improving slowly -now on HFNC 45% Fio2.  -incentive spirometer -appreciate dr Zoila Shutter input. Will try to get therapeutic thoracentesis  INR down . Hold coumdain. Pt aware risk's from not  taking it and agreeable for thoracentesis  * Diverticulitis - failed outpatient therapy due to unable to tolerate oral antibiotics -completed her rx. -no more abdominal pain  * HTN (hypertension) - continue home meds  * GERD (gastroesophageal reflux disease) - continue home dose PPI  * constipation: on bowel regimen, needs BM. abd films ok -had BM  after enema - prn miralax  *gen weakness -PT recommneds rehab. Pt agreeable -Very slow improvement. Cont ensure.  *D/C planning to rehab once oxygen requirement decreases. Plan B ?LTAC  Case discussed with Education officer, museum. D/w dr Mortimer Fries Management plans discussed with the patient and in agreement.  CODE STATUS: full  DVT Prophylaxis: warfarin (now on hold) TEDS and SCD  TOTAL CRITICALTIME TAKING CARE OF THIS PATIENT: 40 mins >50% time spent on counselling and coordination of care   Jerrilynn Mikowski M.D on 12/19/2014 at 11:36 AM  Between 7am to 6pm - Pager - [phone]  After 6pm go to [email or web address] - password [email or web address] [facility]  Office  [phone]  CC: Primary care physician; [name], MD

## 2014-12-19 NOTE — Procedures (Signed)
Under US guidance, thoracentesis of right pleural effusion was performed. No immediate complications. CXR to follow.

## 2014-12-19 NOTE — Progress Notes (Signed)
Pt is alert and oriented. Getting o2 via HF  [place].Sao2 93%. Lung sound diminished. No complaints durning night. SR on Cm. Resting comfortably in bed without any distress.

## 2014-12-19 NOTE — Progress Notes (Signed)
ANTIBIOTIC CONSULT NOTE - FOLLOW UP  Pharmacy Consult for Vancomycin Indication: pneumonia  Allergies  Allergen Reactions  . Amoxicillin Other (See Comments)    Lips swelling, tingling  . Benzocaine-Menthol     Throat swelling  . Biafine [Wound Dressings] Swelling  . Chloraseptic Sore Throat [Acetaminophen] Other (See Comments)    throat swelling  . Fosamax [Alendronate Sodium]   . Neosporin [Neomycin-Bacitracin Zn-Polymyx] Other (See Comments)    Skin redness, puffy  . Other     "chlorotrimitron"-- rxn: throat swelling  . Oxycodone Other (See Comments)    Dizziness, lips tingling  . Statins Other (See Comments)    Muscle weakness, weak  . Tape Other (See Comments)    Skin redness    Patient Measurements: Height: 5\' 5"  (165.1 cm) Weight: 228 lb (103.42 kg) IBW/kg (Calculated) : 57  Vital Signs: BP: 116/60 mmHg (06/29 1600) Pulse Rate: 69 (06/29 1600) Intake/Output from previous day: 06/28 0701 - 06/29 0700 In: 1200 [IV Piggyback:1200] Out: 750 [Urine:750] Intake/Output from this shift:    Labs:  Recent Labs  12/17/14 0921 12/18/14 0342 12/18/14 1044 12/19/14 0527  WBC  --   --  12.6* 10.6  HGB 15.2  --  14.8 13.9  PLT  --   --  320 319  CREATININE 0.94 0.76 0.72 0.56   Estimated Creatinine Clearance: 75.9 mL/min (by C-G formula based on Cr of 0.56).  Recent Labs  12/19/14 1649  VANCOTROUGH 8*     Microbiology: Recent Results (from the past 720 hour(s))  MRSA PCR Screening     Status: None   Collection Time: 12/11/14  4:50 AM  Result Value Ref Range Status   MRSA by PCR NEGATIVE NEGATIVE Final    Comment:        The GeneXpert MRSA Assay (FDA approved for NASAL specimens only), is one component of a comprehensive MRSA colonization surveillance program. It is not intended to diagnose MRSA infection nor to guide or monitor treatment for MRSA infections.   Culture, blood (routine x 2)     Status: None   Collection Time: 12/12/14  1:10 AM   Result Value Ref Range Status   Specimen Description BLOOD  Final   Special Requests NONE  Final   Culture NO GROWTH 5 DAYS  Final   Report Status 12/17/2014 FINAL  Final  Culture, blood (routine x 2)     Status: None   Collection Time: 12/12/14  1:10 AM  Result Value Ref Range Status   Specimen Description BLOOD  Final   Special Requests NONE  Final   Culture NO GROWTH 5 DAYS  Final   Report Status 12/17/2014 FINAL  Final  C difficile quick scan w PCR reflex ([facility] only)     Status: None   Collection Time: 12/17/14  8:59 PM  Result Value Ref Range Status   C Diff antigen NEGATIVE  Final   C Diff toxin NEGATIVE  Final   C Diff interpretation Negative for C. difficile  Final    Anti-infectives    Start     Dose/Rate Route Frequency Ordered Stop   12/19/14 1900  vancomycin (VANCOCIN) 1,250 mg in sodium chloride 0.9 % 250 mL IVPB     1,250 mg 166.7 mL/hr over 90 Minutes Intravenous Every 12 hours 12/19/14 1808     12/18/14 0600  vancomycin (VANCOCIN) 1,250 mg in sodium chloride 0.9 % 250 mL IVPB  Status:  Discontinued     1,250 mg 166.7 mL/hr over 90  Minutes Intravenous Every 18 hours 12/17/14 1547 12/19/14 1808   12/17/14 0900  vancomycin (VANCOCIN) 1,500 mg in sodium chloride 0.9 % 500 mL IVPB     1,500 mg 250 mL/hr over 120 Minutes Intravenous  Once 12/17/14 0847 12/17/14 1336   12/17/14 0900  meropenem (MERREM) 1 g in sodium chloride 0.9 % 100 mL IVPB     1 g 200 mL/hr over 30 Minutes Intravenous 3 times per day 12/17/14 0854     12/13/14 2200  metroNIDAZOLE (FLAGYL) tablet 500 mg  Status:  Discontinued     500 mg Oral 3 times per day 12/13/14 1426 12/17/14 1151   12/13/14 2000  ciprofloxacin (CIPRO) tablet 500 mg  Status:  Discontinued     500 mg Oral 2 times daily 12/13/14 1426 12/17/14 0724   12/12/14 1300  ciprofloxacin (CIPRO) IVPB 400 mg  Status:  Discontinued     400 mg 200 mL/hr over 60 Minutes Intravenous Every 12 hours 12/12/14 0301 12/13/14 1426   12/12/14 1100   metroNIDAZOLE (FLAGYL) IVPB 500 mg  Status:  Discontinued     500 mg 100 mL/hr over 60 Minutes Intravenous Every 8 hours 12/12/14 0301 12/13/14 1426   12/12/14 0211  metroNIDAZOLE (FLAGYL) 5-0.79 MG/ML-% IVPB    Comments:  John Giovanni: cabinet override      12/12/14 0211 12/12/14 1414   12/11/14 2300  ciprofloxacin (CIPRO) IVPB 400 mg     400 mg 200 mL/hr over 60 Minutes Intravenous  Once 12/11/14 2247 12/12/14 0308   12/11/14 2300  metroNIDAZOLE (FLAGYL) IVPB 500 mg     500 mg 100 mL/hr over 60 Minutes Intravenous  Once 12/11/14 2247 12/12/14 0405      Assessment: Patient with respiratory failure due to B/L PNA on vancomycin and meropenem with subtherapeutic vancomycin trough with the 4th dose overall.   Goal of Therapy:  Vancomycin trough level 15-20 mcg/ml  Plan:  Will increase vancomycin to 1250 mg iv q 12 h and check another level with the 4th dose of this new regimen.   Ulice Dash D 12/19/2014,6:09 PM

## 2014-12-19 NOTE — Care Management Note (Signed)
Case Management Note  Patient Details  Name: Laura Gonzalez MRN: [id number] Date of Birth: 03-Nov-1940  Subjective/Objective:  Requested [facility] evaluate patient for [facility]. Updated team in progression rounds                  Action/Plan:   Expected Discharge Date:                  Expected Discharge Plan:  [place]  In-House Referral:  Clinical Social Work  Discharge planning Services     Post Acute Care Choice:    Choice offered to:     DME Arranged:    DME Agency:     HH Arranged:    [place] Agency:     Status of Service:     Medicare Important Message Given:  Yes-second notification given Date Medicare IM Given:  12/12/14 Medicare IM give by:  Shelbie Ammons RN MSN Date Additional Medicare IM Given:  12/14/14 Additional Medicare Important Message give by:  Orvan July  If discussed at Long Length of Stay Meetings, dates discussed:    Additional Comments:  Jolly Mango, RN 12/19/2014, 11:44 AM

## 2014-12-20 ENCOUNTER — Ambulatory Visit (HOSPITAL_COMMUNITY)
Admission: AD | Admit: 2014-12-20 | Discharge: 2014-12-20 | Disposition: A | Discharge status: Home / Self Care | Payer: Medicare Other | Source: Other Acute Inpatient Hospital | Attending: Internal Medicine | Admitting: Internal Medicine

## 2014-12-20 DIAGNOSIS — I4891 Unspecified atrial fibrillation: Secondary | ICD-10-CM | POA: Insufficient documentation

## 2014-12-20 DIAGNOSIS — J189 Pneumonia, unspecified organism: Secondary | ICD-10-CM | POA: Insufficient documentation

## 2014-12-20 LAB — BASIC METABOLIC PANEL
Anion gap: 6 (ref 5–15)
BUN: 25 mg/dL — ABNORMAL HIGH (ref 6–20)
CALCIUM: 9.1 mg/dL (ref 8.9–10.3)
CHLORIDE: 98 mmol/L — AB (ref 101–111)
CO2: 33 mmol/L — ABNORMAL HIGH (ref 22–32)
Creatinine, Ser: 0.63 mg/dL (ref 0.44–1.00)
GFR calc non Af Amer: >60 mL/min (ref >60)
Glucose, Bld: 123 mg/dL — ABNORMAL HIGH (ref 65–99)
POTASSIUM: 3.7 mmol/L (ref 3.5–5.1)
Sodium: 137 mmol/L (ref 135–145)

## 2014-12-20 LAB — PHOSPHORUS: Phosphorus: 2.9 mg/dL (ref 2.5–4.6)

## 2014-12-20 LAB — PROTIME-INR
INR: 1.79
Prothrombin Time: 21 seconds — ABNORMAL HIGH (ref 11.4–15.0)

## 2014-12-20 LAB — MAGNESIUM: Magnesium: 1.9 mg/dL (ref 1.7–2.4)

## 2014-12-20 MED ORDER — WARFARIN SODIUM 1 MG PO TABS
2.0000 mg | ORAL_TABLET | Freq: Every day | ORAL | Status: DC
  Administered 2014-12-20: 2 mg via ORAL
  Filled 2014-12-20: qty 2

## 2014-12-20 MED ORDER — ENSURE ENLIVE PO LIQD
237.0000 mL | Freq: Three times a day (TID) | ORAL | Status: DC
Start: 2014-12-20 — End: 2015-02-19

## 2014-12-20 MED ORDER — PREDNISONE 20 MG PO TABS
20.0000 mg | ORAL_TABLET | Freq: Every day | ORAL | Status: DC
  Administered 2014-12-20: 20 mg via ORAL
  Filled 2014-12-20: qty 1

## 2014-12-20 MED ORDER — WARFARIN SODIUM 2 MG PO TABS: 2.0000 mg | ORAL_TABLET | Freq: Every day | ORAL | Status: DC

## 2014-12-20 MED ORDER — WARFARIN - PHARMACIST DOSING INPATIENT: Freq: Every day | Status: DC

## 2014-12-20 MED ORDER — ALBUTEROL SULFATE (2.5 MG/3ML) 0.083% IN NEBU
2.5000 mg | INHALATION_SOLUTION | RESPIRATORY_TRACT | Status: DC | PRN
  Administered 2014-12-20: 2.5 mg via RESPIRATORY_TRACT
  Filled 2014-12-20: qty 3

## 2014-12-20 MED ORDER — LEVOFLOXACIN 750 MG PO TABS: 750.0000 mg | ORAL_TABLET | ORAL | Status: DC

## 2014-12-20 MED ORDER — LEVOFLOXACIN 750 MG PO TABS: 750.0000 mg | ORAL_TABLET | Freq: Every day | ORAL | Status: DC

## 2014-12-20 MED ORDER — PREDNISONE 20 MG PO TABS
20.0000 mg | ORAL_TABLET | Freq: Every day | ORAL | Status: DC
Start: 2014-12-20 — End: 2015-02-19

## 2014-12-20 NOTE — Progress Notes (Signed)
A&Ox4. Denies pain. o2 sats 88-91% on 6L nasal cannula. VSS. Afebrile. Report given to [facility] prior to their arrival. Patient loaded on stretcher by [facility] link staff. Paper work given to [employer]. Patient left ICU by stretcher, alert with no distress noted on care link's monitoring equipment and o2.  Patient being transferred to [facility].  Jalaina Salyers B

## 2014-12-20 NOTE — Progress Notes (Deleted)
Performed spontaneous breathing trails on pressure support 8 and peep of 5 30%.  Patient had good volumes of approx. 500cc.  ETT pulled ( per Dr. Mortimer Fries who was present at time ).  Pt currently on room air doing well HR 104 RR 15 oxygen saturations 97%. No adverse reactions noted.

## 2014-12-20 NOTE — Care Management Note (Signed)
Case Management Note  Patient Details  Name: Laura Gonzalez MRN: [id number] Date of Birth: 1941-02-12  Subjective/Objective:  Discussed LTACH with Seth Bake @ [facility]. They are able to accept patient patient today. Discussed with Dr. Posey Pronto who feels this is the best plan for patient. Met with patient, explained LTACH in detail and she is agreeable to POC. Primary nurse updated. Will transfer later today.                   Action/Plan: [facility]  Expected Discharge Date:   12/20/2014               Expected Discharge Plan:  [facility]  In-House Referral:  Clinical Social Work  Discharge planning Services     Post Acute Care Choice:    Choice offered to:     DME Arranged:    DME Agency:     HH Arranged:    [place] Agency:     Status of Service:     Medicare Important Message Given:  Yes-third notification given Date Medicare IM Given:  12/12/14 Medicare IM give by:  Shelbie Ammons RN MSN Date Additional Medicare IM Given:  12/14/14 Additional Medicare Important Message give by:  Orvan July  If discussed at Long Length of Stay Meetings, dates discussed:    Additional Comments:  Jolly Mango, RN 12/20/2014, 8:45 AM

## 2014-12-20 NOTE — Discharge Summary (Signed)
[place] [facility] NAME: Jeanell Mangan    MR#:  [id number]  DATE OF BIRTH:  20-Mar-1941  DATE OF ADMISSION:  12/11/2014 ADMITTING PHYSICIAN: Lance Coon, MD  DATE OF DISCHARGE: 12/20/2014  PRIMARY CARE PHYSICIAN: [name], MD    ADMISSION DIAGNOSIS:  SIRS (systemic inflammatory response syndrome) [A41.9] Diverticulitis of large intestine without perforation or abscess without bleeding [K57.32]  DISCHARGE DIAGNOSIS:  Sepsis due to Bilateral pneumonia(HCAP) Bilateral pleural effusion s/p Thoracentesis right side on June 29th,2016 Acute hypoxic respiratory failure now on oxygen Acute on chronic afib (on sotalol and cardizem) with chronic anticoagulation with warfarin Diverticulitis-completed rx  SECONDARY DIAGNOSIS:   Past Medical History  Diagnosis Date  . Dysrhythmia     afib  . Pneumonia   . Asthma   . Numbness and tingling     arm to leg  . Arthritis     wrist and knees  . Hernia, umbilical   . Shortness of breath     only with astma attacks  . Sleep apnea   . Cancer     skin cancer  . Osteoporosis   . Hypertension   . GERD (gastroesophageal reflux disease)   . Diverticulosis     HOSPITAL COURSE:  Mae Denunzio is a 74 y.o. female who presents with diverticulitis. Patient came to the ED yesterday with several days complaint of left lower quadrant pain and was found on imaging to have diverticulitis  * Rapid A-fib - Acute on chronic, on chronic anticoagulation for this with warfarin. - Pharmacy managing anticoagulation.  -intermittent tachycardia and on sotalol and cardizem per cardiology. HR now improved -Cardiology consult with Dr Josefa Half appreciated -Echo \\done  -resumed back warfarin and follow INR  * Acute hypoxic respiratory failure due to bilateral pneumonia (noted on CT chest) appears HCAP with bilateral pleural effusions (right >left) -Vanc and meropenem(allergic to  amoxicillin)--->change to po levaquin for 7 more days -improving slowly -now on HFNC 45% Fio2--->weans to 6 liter Tatums sats 88-92%.  -incentive spirometer -appreciate dr Zoila Shutter input.  -s/p right sided pleural tap with removal of 300 cc fluid. Appears due to pneumonia  * Diverticulitis - failed outpatient therapy due to unable to tolerate oral antibiotics -completed her rx. -no more abdominal pain  * HTN (hypertension) - continue home meds  * GERD (gastroesophageal reflux disease) - continue home dose PPI  * constipation: on bowel regimem - abd films ok -had BM after enema few days ago - prn miralax  *gen weakness with multiple co morbitdities and still requiring high oxygen pt will benefit from [facility] facility. Pt will be discharged later today. Spoke with Dr Mortimer Fries and agrees with th plan  DISCHARGE CONDITIONS:   fair  CONSULTS OBTAINED:  Treatment Team:  Lollie Sails, MD Flora Lipps, MD Isaias Cowman, MD  DRUG ALLERGIES:   Allergies  Allergen Reactions  . Amoxicillin Other (See Comments)    Lips swelling, tingling  . Benzocaine-Menthol     Throat swelling  . Biafine [Wound Dressings] Swelling  . Chloraseptic Sore Throat [Acetaminophen] Other (See Comments)    throat swelling  . Fosamax [Alendronate Sodium]   . Neosporin [Neomycin-Bacitracin Zn-Polymyx] Other (See Comments)    Skin redness, puffy  . Other     "chlorotrimitron"-- rxn: throat swelling  . Oxycodone Other (See Comments)    Dizziness, lips tingling  . Statins Other (See Comments)    Muscle weakness, weak  . Tape Other (See Comments)  Skin redness    DISCHARGE MEDICATIONS:   Current Discharge Medication List    START taking these medications   Details  feeding supplement, ENSURE ENLIVE, (ENSURE ENLIVE) LIQD Take 237 mLs by mouth 3 (three) times daily between meals. Qty: 237 mL, Refills: 12    levofloxacin (LEVAQUIN) 750 MG tablet Take 1 tablet (750 mg total) by mouth  daily. Qty: 7 tablet, Refills: 0    predniSONE (DELTASONE) 20 MG tablet Take 1 tablet (20 mg total) by mouth daily with breakfast. Qty: 10 tablet, Refills: 0      CONTINUE these medications which have CHANGED   Details  warfarin (COUMADIN) 2 MG tablet Take 1 tablet (2 mg total) by mouth daily. Qty: 30 tablet, Refills: 0      CONTINUE these medications which have NOT CHANGED   Details  Calcium Citrate (CITRACAL PO) Take 1 tablet by mouth 2 (two) times daily.    diltiazem (CARDIZEM CD) 300 MG 24 hr capsule Take 300 mg by mouth daily.    ezetimibe (ZETIA) 10 MG tablet Take 10 mg by mouth at bedtime.    Fluticasone-Salmeterol (ADVAIR) 100-50 MCG/DOSE AEPB Inhale 1 puff into the lungs every 12 (twelve) hours.    hydrochlorothiazide (HYDRODIURIL) 25 MG tablet Take 25 mg by mouth daily.    magnesium oxide (MAG-OX) 400 MG tablet Take 400 mg by mouth daily.    Misc Natural Products (OSTEO BI-FLEX JOINT SHIELD PO) Take 1 tablet by mouth daily.    montelukast (SINGULAIR) 10 MG tablet Take 10 mg by mouth at bedtime.    !! Multiple Vitamin (MULTIVITAMIN WITH MINERALS) TABS Take 1 tablet by mouth daily.    !! Multiple Vitamins-Minerals (PRESERVISION AREDS PO) Take 1 tablet by mouth daily.    Omega-3 Fatty Acids (FISH OIL) 1200 MG CAPS Take 1,200 mg by mouth daily.    OVER THE COUNTER MEDICATION Take 3,000 Units by mouth daily. Vitamin D3    potassium chloride SA (K-DUR,KLOR-CON) 20 MEQ tablet Take 20 mEq by mouth 3 (three) times daily.    theophylline (THEODUR) 300 MG 12 hr tablet Take 300 mg by mouth 2 (two) times daily.    vitamin B-12 (CYANOCOBALAMIN) 1000 MCG tablet Take 1,000 mcg by mouth daily.    albuterol (PROVENTIL HFA;VENTOLIN HFA) 108 (90 BASE) MCG/ACT inhaler Inhale 2 puffs into the lungs every 6 (six) hours as needed. For shortness of breath    cyclobenzaprine (FLEXERIL) 10 MG tablet Take 1 tablet (10 mg total) by mouth 3 (three) times daily as needed for muscle  spasms. Qty: 40 tablet, Refills: 1    diltiazem (CARDIZEM) 60 MG tablet Take 120 mg by mouth every 6 (six) hours as needed. Takes if has palpitation after taking diltiazem 300mg  dose    loratadine (CLARITIN) 10 MG tablet Take 10 mg by mouth daily.    mometasone (NASONEX) 50 MCG/ACT nasal spray Place 1 spray into the nose daily.    traMADol (ULTRAM) 50 MG tablet Take 1 tablet (50 mg total) by mouth every 6 (six) hours as needed. Qty: 20 tablet, Refills: 0     !! - Potential duplicate medications found. Please discuss with provider.    STOP taking these medications     ciprofloxacin (CIPRO) 500 MG tablet      metroNIDAZOLE (FLAGYL) 500 MG tablet      albuterol (PROVENTIL) (2.5 MG/3ML) 0.083% nebulizer solution      traMADol-acetaminophen (ULTRACET) 37.5-325 MG per tablet        If you  experience worsening of your admission symptoms, develop shortness of breath, life threatening emergency, suicidal or homicidal thoughts you must seek medical attention immediately by calling 911 or calling your MD immediately  if symptoms less severe.  You Must read complete instructions/literature along with all the possible adverse reactions/side effects for all the Medicines you take and that have been prescribed to you. Take any new Medicines after you have completely understood and accept all the possible adverse reactions/side effects.   Please note  You were cared for by a hospitalist during your hospital stay. If you have any questions about your discharge medications or the care you received while you were in the hospital after you are discharged, you can call the unit and asked to speak with the hospitalist on call if the hospitalist that took care of you is not available. Once you are discharged, your primary care physician will handle any further medical issues. Please note that NO REFILLS for any discharge medications will be authorized once you are discharged, as it is imperative that you  return to your primary care physician (or establish a relationship with a primary care physician if you do not have one) for your aftercare needs so that they can reassess your need for medications and monitor your lab values. Today   SUBJECTIVE   Feeling better. Tolerating diet. On 6L [place]  VITAL SIGNS:  Blood pressure 112/52, pulse 73, temperature 98.4 F (36.9 C), temperature source Oral, resp. rate 25, height 5\' 5"  (1.651 m), weight 103.42 kg (228 lb), SpO2 93 %.  I/O:   Intake/Output Summary (Last 24 hours) at 12/20/14 0923 Last data filed at 12/20/14 0900  Gross per 24 hour  Intake    243 ml  Output   1250 ml  Net  -1007 ml    PHYSICAL EXAMINATION:   GENERAL: 74 y.o.-year-old patient lying in the bed with mild distress due to sob obese EYES: Pupils equal, round, reactive to light and accommodation. No scleral icterus. Extraocular muscles intact.  HEENT: Head atraumatic, normocephalic. Oropharynx and nasopharynx clear.  NECK: Supple, no jugular venous distention. No thyroid enlargement, no tenderness.  LUNGS: decreased breath sounds bilaterally, no wheezing, rales, rhonchi. No use of accessory muscles of respiration.  CARDIOVASCULAR: S1, S2 normal. Irregularly irregular HR No murmurs, rubs, or gallops.  ABDOMEN: Soft, nontender, nondistended. Bowel sounds present. No organomegaly or mass.  EXTREMITIES: No cyanosis, clubbing or edema b/l.  NEUROLOGIC: Cranial nerves II through XII are intact. No focal motor or sensory deficits b/l.  PSYCHIATRIC: alert and oriented x 3.  SKIN: No obvious rash, lesion, or ulcer.  DATA REVIEW:   CBC   Recent Labs Lab 12/19/14 0527  WBC 10.6  HGB 13.9  HCT 40.7  PLT 319    Chemistries   Recent Labs Lab 12/20/14 0552  NA 137  K 3.7  CL 98*  CO2 33*  GLUCOSE 123*  BUN 25*  CREATININE 0.63  CALCIUM 9.1  MG 1.9    Microbiology Results   Recent Results (from the past 240 hour(s))  MRSA PCR Screening      Status: None   Collection Time: 12/11/14  4:50 AM  Result Value Ref Range Status   MRSA by PCR NEGATIVE NEGATIVE Final    Comment:        The GeneXpert MRSA Assay (FDA approved for NASAL specimens only), is one component of a comprehensive MRSA colonization surveillance program. It is not intended to diagnose MRSA infection nor to guide or  monitor treatment for MRSA infections.   Culture, blood (routine x 2)     Status: None   Collection Time: 12/12/14  1:10 AM  Result Value Ref Range Status   Specimen Description BLOOD  Final   Special Requests NONE  Final   Culture NO GROWTH 5 DAYS  Final   Report Status 12/17/2014 FINAL  Final  Culture, blood (routine x 2)     Status: None   Collection Time: 12/12/14  1:10 AM  Result Value Ref Range Status   Specimen Description BLOOD  Final   Special Requests NONE  Final   Culture NO GROWTH 5 DAYS  Final   Report Status 12/17/2014 FINAL  Final  C difficile quick scan w PCR reflex ([facility] only)     Status: None   Collection Time: 12/17/14  8:59 PM  Result Value Ref Range Status   C Diff antigen NEGATIVE  Final   C Diff toxin NEGATIVE  Final   C Diff interpretation Negative for C. difficile  Final  Body fluid culture     Status: None (Preliminary result)   Collection Time: 12/19/14  2:40 PM  Result Value Ref Range Status   Specimen Description PLEURAL  Final   Special Requests NONE  Final   Gram Stain PENDING  Incomplete   Culture NO GROWTH < 12 HOURS  Final   Report Status PENDING  Incomplete    RADIOLOGY:  Dg Chest 1 View  12/19/2014   CLINICAL DATA:  Status post right-sided thoracentesis.  EXAM: CHEST  1 VIEW  COMPARISON:  December 16, 2014.  FINDINGS: Stable cardiomediastinal silhouette. Stable left pleural effusion. Right pleural effusion is nearly resolved status post thoracentesis. No pneumothorax is noted.  IMPRESSION: No pneumothorax status post right-sided thoracentesis. Right pleural effusion is nearly resolved.   Electronically  Signed   By: Marijo Conception, M.D.   On: 12/19/2014 15:14   US Thoracentesis Asp Pleural Space W/img Guide  12/19/2014   CLINICAL DATA:  Right pleural effusion.  EXAM: ULTRASOUND GUIDED right THORACENTESIS  COMPARISON:  CT scan of December 16, 2014.  PROCEDURE: An ultrasound guided thoracentesis was thoroughly discussed with the patient and questions answered. The benefits, risks, alternatives and complications were also discussed. The patient understands and wishes to proceed with the procedure. Written consent was obtained.  Ultrasound was performed to localize and mark an adequate pocket of fluid in the right chest. The area was then prepped and draped in the normal sterile fashion. 1% Lidocaine was used for local anesthesia. Under ultrasound guidance a Safe-T-Centesis catheter was introduced. Thoracentesis was performed. The catheter was removed and a dressing applied.  Complications:  None immediate.  FINDINGS: A total of approximately 300 mL of serosanguineous fluid was removed. A fluid sample was notsent for laboratory analysis.  IMPRESSION: Successful ultrasound guided right thoracentesis yielding 300 mL of pleural fluid.   Electronically Signed   By: Marijo Conception, M.D.   On: 12/19/2014 15:12   Management plans discussed with the patient and  in agreement.  CODE STATUS:     Code Status Orders        Start     Ordered   12/12/14 0301  Full code   Continuous     12/12/14 0301      TOTAL TIME TAKING CARE OF THIS PATIENT: 40 minutes.    Ryana Montecalvo M.D on 12/20/2014 at 9:23 AM  Between 7am to 6pm - Pager - [phone] After 6pm go to [email or web address] -  password [email or web address] [facility]  [facility]  Office  [phone]  CC: Primary care physician; [name], MD

## 2014-12-20 NOTE — Clinical Social Work Note (Signed)
CSW has confirmed with RN CM that patient is to go to [facility] today. Shela Leff MSW,LCSWA [phone]

## 2014-12-20 NOTE — Progress Notes (Signed)
Report called to Minna Merritts, RN at [facility].

## 2014-12-22 LAB — BODY FLUID CULTURE: Culture: NO GROWTH

## 2015-01-16 ENCOUNTER — Other Ambulatory Visit: Payer: Self-pay | Admitting: Internal Medicine

## 2015-01-16 DIAGNOSIS — Z1231 Encounter for screening mammogram for malignant neoplasm of breast: Secondary | ICD-10-CM

## 2015-02-12 ENCOUNTER — Encounter
Admission: RE | Admit: 2015-02-12 | Discharge: 2015-02-12 | Disposition: A | Discharge status: Home / Self Care | Payer: Medicare Other | Source: Ambulatory Visit | Attending: Surgery | Admitting: Surgery

## 2015-02-12 DIAGNOSIS — Z01810 Encounter for preprocedural cardiovascular examination: Secondary | ICD-10-CM | POA: Diagnosis not present

## 2015-02-12 DIAGNOSIS — Z01812 Encounter for preprocedural laboratory examination: Secondary | ICD-10-CM | POA: Diagnosis present

## 2015-02-12 LAB — CBC
HEMATOCRIT: 42.6 % (ref 35.0–47.0)
Hemoglobin: 13.7 g/dL (ref 12.0–16.0)
MCH: 29.4 pg (ref 26.0–34.0)
MCHC: 32.1 g/dL (ref 32.0–36.0)
MCV: 91.6 fL (ref 80.0–100.0)
PLATELETS: 277 10*3/uL (ref 150–440)
RBC: 4.65 MIL/uL (ref 3.80–5.20)
RDW: 15.9 % — ABNORMAL HIGH (ref 11.5–14.5)
WBC: 8.3 10*3/uL (ref 3.6–11.0)

## 2015-02-12 LAB — POTASSIUM: POTASSIUM: 4.2 mmol/L (ref 3.5–5.1)

## 2015-02-12 NOTE — Patient Instructions (Signed)
  Your procedure is scheduled on: February 19, 2015 (Tuesday)Report to Day Surgery. To find out your arrival time please call [phone] between 1PM - 3PM on August 29. 2016 (Monday).  Remember: Instructions that are not followed completely may result in serious medical risk, up to and including death, or upon the discretion of your surgeon and anesthesiologist your surgery may need to be rescheduled.    __x__ 1. Do not eat food or drink liquids after midnight. No gum chewing or hard candies.     ____ 2. No Alcohol for 24 hours before or after surgery.   ____ 3. Bring all medications with you on the day of surgery if instructed.    __x__ 4. Notify your doctor if there is any change in your medical condition     (cold, fever, infections).     Do not wear jewelry, make-up, hairpins, clips or nail polish.  Do not wear lotions, powders, or perfumes. You may wear deodorant.  Do not shave 48 hours prior to surgery. Men may shave face and neck.  Do not bring valuables to the hospital.    [facility] is not responsible for any belongings or valuables.               Contacts, dentures or bridgework may not be worn into surgery.  Leave your suitcase in the car. After surgery it may be brought to your room.  For patients admitted to the hospital, discharge time is determined by your                treatment team.   Patients discharged the day of surgery will not be allowed to drive home.   Please read over the following fact sheets that you were given:   Surgical Site Infection Sheet   __x__ Take these medicines the morning of surgery with A SIP OF WATER:    1. Metoprolol  2. Magnesium Oxide  3. Diltiazem  4.  5.  6.  ____ Fleet Enema (as directed)   _x___ Use CHG Soap as directed  _x__ Use inhalers on the day of surgery (Ipratropium-Albuterol Nebulizer the morning of surgery prior to coming for surgery) ____ Stop metformin 2 days prior to surgery    ____ Take 1/2 of usual insulin  dose the night before surgery and none on the morning of surgery.   _x___ Stop Coumadin/Plavix/aspirin on (STOP WARFARIN FIVE DAYS PRIOR TO SURGERY PER DR SMITH OFFICE)  ____ Stop Anti-inflammatories on    _x ___ Stop supplements until after surgery. (STOP ALL SUPPLEMENTS NOW)   _x___ Bring C-Pap to the hospital.

## 2015-02-13 NOTE — OR Nursing (Signed)
Spoke with Dr Rosey Bath about preop ekg 02/12/15 with history of prior mi. Ok to proceed

## 2015-02-19 ENCOUNTER — Encounter
Admission: RE | Disposition: A | Discharge status: Home / Self Care | Payer: Self-pay | Source: Ambulatory Visit | Attending: Surgery

## 2015-02-19 ENCOUNTER — Ambulatory Visit
Admission: RE | Admit: 2015-02-19 | Discharge: 2015-02-19 | Disposition: A | Discharge status: Home / Self Care | Payer: Medicare Other | Source: Ambulatory Visit | Attending: Surgery | Admitting: Surgery

## 2015-02-19 ENCOUNTER — Ambulatory Visit: Payer: Medicare Other | Admitting: Anesthesiology

## 2015-02-19 ENCOUNTER — Encounter: Payer: Self-pay | Admitting: *Deleted

## 2015-02-19 DIAGNOSIS — Z9889 Other specified postprocedural states: Secondary | ICD-10-CM | POA: Insufficient documentation

## 2015-02-19 DIAGNOSIS — E669 Obesity, unspecified: Secondary | ICD-10-CM | POA: Insufficient documentation

## 2015-02-19 DIAGNOSIS — Z981 Arthrodesis status: Secondary | ICD-10-CM | POA: Insufficient documentation

## 2015-02-19 DIAGNOSIS — K219 Gastro-esophageal reflux disease without esophagitis: Secondary | ICD-10-CM | POA: Diagnosis not present

## 2015-02-19 DIAGNOSIS — Z7901 Long term (current) use of anticoagulants: Secondary | ICD-10-CM | POA: Insufficient documentation

## 2015-02-19 DIAGNOSIS — Z8489 Family history of other specified conditions: Secondary | ICD-10-CM | POA: Diagnosis not present

## 2015-02-19 DIAGNOSIS — Z888 Allergy status to other drugs, medicaments and biological substances status: Secondary | ICD-10-CM | POA: Insufficient documentation

## 2015-02-19 DIAGNOSIS — Z79899 Other long term (current) drug therapy: Secondary | ICD-10-CM | POA: Insufficient documentation

## 2015-02-19 DIAGNOSIS — Z885 Allergy status to narcotic agent status: Secondary | ICD-10-CM | POA: Diagnosis not present

## 2015-02-19 DIAGNOSIS — M810 Age-related osteoporosis without current pathological fracture: Secondary | ICD-10-CM | POA: Diagnosis not present

## 2015-02-19 DIAGNOSIS — Z8261 Family history of arthritis: Secondary | ICD-10-CM | POA: Diagnosis not present

## 2015-02-19 DIAGNOSIS — Z806 Family history of leukemia: Secondary | ICD-10-CM | POA: Insufficient documentation

## 2015-02-19 DIAGNOSIS — J45909 Unspecified asthma, uncomplicated: Secondary | ICD-10-CM | POA: Diagnosis not present

## 2015-02-19 DIAGNOSIS — Z800 Family history of malignant neoplasm of digestive organs: Secondary | ICD-10-CM | POA: Diagnosis not present

## 2015-02-19 DIAGNOSIS — I4891 Unspecified atrial fibrillation: Secondary | ICD-10-CM | POA: Insufficient documentation

## 2015-02-19 DIAGNOSIS — Z96659 Presence of unspecified artificial knee joint: Secondary | ICD-10-CM | POA: Insufficient documentation

## 2015-02-19 DIAGNOSIS — G4730 Sleep apnea, unspecified: Secondary | ICD-10-CM | POA: Diagnosis not present

## 2015-02-19 DIAGNOSIS — M170 Bilateral primary osteoarthritis of knee: Secondary | ICD-10-CM | POA: Insufficient documentation

## 2015-02-19 DIAGNOSIS — R1900 Intra-abdominal and pelvic swelling, mass and lump, unspecified site: Secondary | ICD-10-CM | POA: Diagnosis present

## 2015-02-19 DIAGNOSIS — E785 Hyperlipidemia, unspecified: Secondary | ICD-10-CM | POA: Insufficient documentation

## 2015-02-19 DIAGNOSIS — Z6838 Body mass index (BMI) 38.0-38.9, adult: Secondary | ICD-10-CM | POA: Insufficient documentation

## 2015-02-19 DIAGNOSIS — K654 Sclerosing mesenteritis: Secondary | ICD-10-CM | POA: Diagnosis not present

## 2015-02-19 DIAGNOSIS — Z803 Family history of malignant neoplasm of breast: Secondary | ICD-10-CM | POA: Diagnosis not present

## 2015-02-19 DIAGNOSIS — Z86010 Personal history of colonic polyps: Secondary | ICD-10-CM | POA: Insufficient documentation

## 2015-02-19 DIAGNOSIS — Z8249 Family history of ischemic heart disease and other diseases of the circulatory system: Secondary | ICD-10-CM | POA: Diagnosis not present

## 2015-02-19 DIAGNOSIS — I10 Essential (primary) hypertension: Secondary | ICD-10-CM | POA: Diagnosis not present

## 2015-02-19 DIAGNOSIS — Z9012 Acquired absence of left breast and nipple: Secondary | ICD-10-CM | POA: Insufficient documentation

## 2015-02-19 DIAGNOSIS — Z881 Allergy status to other antibiotic agents status: Secondary | ICD-10-CM | POA: Insufficient documentation

## 2015-02-19 DIAGNOSIS — Z7951 Long term (current) use of inhaled steroids: Secondary | ICD-10-CM | POA: Insufficient documentation

## 2015-02-19 DIAGNOSIS — Z85828 Personal history of other malignant neoplasm of skin: Secondary | ICD-10-CM | POA: Diagnosis not present

## 2015-02-19 DIAGNOSIS — J302 Other seasonal allergic rhinitis: Secondary | ICD-10-CM | POA: Insufficient documentation

## 2015-02-19 LAB — PROTIME-INR
INR: 1.05
PROTHROMBIN TIME: 13.9 s (ref 11.4–15.0)

## 2015-02-19 SURGERY — EXCISION, NEOPLASM, ABDOMINAL WALL
Anesthesia: Monitor Anesthesia Care | Wound class: Clean

## 2015-02-19 MED ORDER — LACTATED RINGERS IV SOLN
INTRAVENOUS | Status: DC
  Administered 2015-02-19 (×2): via INTRAVENOUS

## 2015-02-19 MED ORDER — BUPIVACAINE-EPINEPHRINE (PF) 0.5% -1:200000 IJ SOLN
INTRAMUSCULAR | Status: AC
  Filled 2015-02-19: qty 30

## 2015-02-19 MED ORDER — FENTANYL CITRATE (PF) 100 MCG/2ML IJ SOLN
INTRAMUSCULAR | Status: DC | PRN
  Administered 2015-02-19 (×2): 50 ug via INTRAVENOUS

## 2015-02-19 MED ORDER — FAMOTIDINE 20 MG PO TABS
ORAL_TABLET | ORAL | Status: AC
  Administered 2015-02-19: 20 mg via ORAL
  Filled 2015-02-19: qty 1

## 2015-02-19 MED ORDER — MIDAZOLAM HCL 2 MG/2ML IJ SOLN
INTRAMUSCULAR | Status: DC | PRN
  Administered 2015-02-19 (×2): 1 mg via INTRAVENOUS

## 2015-02-19 MED ORDER — LIDOCAINE-EPINEPHRINE (PF) 1 %-1:200000 IJ SOLN
INTRAMUSCULAR | Status: AC
  Filled 2015-02-19: qty 30

## 2015-02-19 MED ORDER — LIDOCAINE-EPINEPHRINE (PF) 1 %-1:200000 IJ SOLN
INTRAMUSCULAR | Status: DC | PRN
  Administered 2015-02-19: 6 mL

## 2015-02-19 MED ORDER — ONDANSETRON HCL 4 MG/2ML IJ SOLN: 4.0000 mg | Freq: Once | INTRAMUSCULAR | Status: DC | PRN

## 2015-02-19 MED ORDER — FAMOTIDINE 20 MG PO TABS
20.0000 mg | ORAL_TABLET | Freq: Once | ORAL | Status: AC
  Administered 2015-02-19: 20 mg via ORAL

## 2015-02-19 MED ORDER — PROPOFOL 1000 MG/100ML IV EMUL
INTRAVENOUS | Status: DC | PRN
  Administered 2015-02-19: 50 ug/kg/min via INTRAVENOUS

## 2015-02-19 MED ORDER — FENTANYL CITRATE (PF) 100 MCG/2ML IJ SOLN: 25.0000 ug | INTRAMUSCULAR | Status: DC | PRN

## 2015-02-19 MED ORDER — LIDOCAINE-EPINEPHRINE 1 %-1:100000 IJ SOLN
INTRAMUSCULAR | Status: AC
  Filled 2015-02-19: qty 1

## 2015-02-19 MED ORDER — PROPOFOL 10 MG/ML IV BOLUS
INTRAVENOUS | Status: DC | PRN
  Administered 2015-02-19: 20 mg via INTRAVENOUS

## 2015-02-19 SURGICAL SUPPLY — 24 items
CANISTER SUCT 1200ML W/VALVE (MISCELLANEOUS) ×3 IMPLANT
CATH TRAY 16F METER LATEX (MISCELLANEOUS) ×3 IMPLANT
CHLORAPREP W/TINT 26ML (MISCELLANEOUS) ×3 IMPLANT
DRAPE LAPAROTOMY 100X77 ABD (DRAPES) ×3 IMPLANT
GLOVE BIO SURGEON STRL SZ7.5 (GLOVE) ×6 IMPLANT
GOWN STRL REUS W/ TWL LRG LVL3 (GOWN DISPOSABLE) ×2 IMPLANT
GOWN STRL REUS W/TWL LRG LVL3 (GOWN DISPOSABLE) ×4
KIT RM TURNOVER STRD PROC AR (KITS) ×3 IMPLANT
LABEL OR SOLS (LABEL) ×3 IMPLANT
LIQUID BAND (GAUZE/BANDAGES/DRESSINGS) ×3 IMPLANT
NEEDLE HYPO 25X1 1.5 SAFETY (NEEDLE) ×3 IMPLANT
NS IRRIG 1000ML POUR BTL (IV SOLUTION) ×3 IMPLANT
PACK BASIN MAJOR ARMC (MISCELLANEOUS) ×3 IMPLANT
PACK COLON CLEAN CLOSURE (MISCELLANEOUS) ×3 IMPLANT
PAD GROUND ADULT SPLIT (MISCELLANEOUS) ×3 IMPLANT
SPONGE LAP 18X18 5 PK (GAUZE/BANDAGES/DRESSINGS) ×3 IMPLANT
SUT CHROMIC 0 CT 1 (SUTURE) IMPLANT
SUT CHROMIC 2 0 SH (SUTURE) IMPLANT
SUT CHROMIC 3 0 SH 27 (SUTURE) IMPLANT
SUT CHROMIC BR 1/2CLE 2-0 54IN (SUTURE) IMPLANT
SUT MAXON ABS #0 GS21 30IN (SUTURE) IMPLANT
SUT MNCRL AB 4-0 PS2 18 (SUTURE) ×3 IMPLANT
SUT VIC AB 5-0 RB1 27 (SUTURE) IMPLANT
SYRINGE 10CC LL (SYRINGE) ×3 IMPLANT

## 2015-02-19 NOTE — Op Note (Signed)
OPERATIVE REPORT  PREOPERATIVE  DIAGNOSIS: . Abdominal wall mass, possible organizing hematoma  POSTOPERATIVE DIAGNOSIS: Abdominal wall mass possible organizing hematoma.  PROCEDURE: . Excision of abdominal wall mass  ANESTHESIA:  Local with monitored anesthesia care  SURGEON: Rochel Brome  MD   INDICATIONS: . She has a history of Lovenox injections in the subcutaneous tissues of the abdomen. She has developed a painful mass desiring excision.  With the patient on the operating table in the supine position she was monitored and sedated by the anesthesia staff. The marked site of the left mid abdominal wall was prepared with ChloraPrep and draped in a sterile manner  The mass was palpable. The skin was infiltrated with 1% Xylocaine with epinephrine. A transversely oriented 4 cm incision was made and carried down through subcutaneous tissues extended approximately 1 cm deep to the skin to encounter the mass which had a dark color. Some surrounding fatty tissue was excised with the mass. The ex vivo measurement was some 1.8 cm in dimension. The mass was submitted in formalin for routine pathology. The wound was inspected and several small blood points were cauterized. Hemostasis was subsequently intact. The wound was closed with a running 4-0 Monocryl subcuticular suture and LiquiBand. The patient tolerated surgery satisfactorily and was then prepared for transfer to the recovery room.  Rochel Brome M.D.

## 2015-02-19 NOTE — Transfer of Care (Signed)
Immediate Anesthesia Transfer of Care Note  Patient: Laura Gonzalez  Procedure(s) Performed: Procedure(s): EXCISION OF ABDOMINAL WALL TUMOR/MASS (N/A)  Patient Location: PACU  Anesthesia Type:MAC  Level of Consciousness: awake, alert  and oriented  Airway & Oxygen Therapy: Patient Spontanous Breathing  Post-op Assessment: Report given to RN and Post -op Vital signs reviewed and stable  Post vital signs: Reviewed and stable  Last Vitals:  Filed Vitals:   02/19/15 0607  BP: 127/62  Pulse: 76  Temp: 36.4 C  Resp: 16    Complications: No apparent anesthesia complications

## 2015-02-19 NOTE — Anesthesia Postprocedure Evaluation (Signed)
  Anesthesia Post-op Note  Patient: Laura Gonzalez  Procedure(s) Performed: Procedure(s): EXCISION OF ABDOMINAL WALL TUMOR/MASS (N/A)  Anesthesia type:MAC  Patient location: PACU  Post pain: Pain level controlled  Post assessment: Post-op Vital signs reviewed, Patient's Cardiovascular Status Stable, Respiratory Function Stable, Patent Airway and No signs of Nausea or vomiting  Post vital signs: Reviewed and stable  Last Vitals:  Filed Vitals:   02/19/15 1013  BP: 112/49  Pulse: 67  Temp:   Resp: 18    Level of consciousness: awake, alert  and patient cooperative  Complications: No apparent anesthesia complications

## 2015-02-19 NOTE — Discharge Instructions (Addendum)
Take Tylenol if needed for pain. May shower and blot dry. Resume usual activities as tolerated.

## 2015-02-19 NOTE — H&P (Signed)
  She has been off coumadin for surgery. She reports no change in condition since office visit.  She points to the small tender mass in left abdominal wall.  This site was marked with black ink.  Discussed plan for excision.

## 2015-02-19 NOTE — Anesthesia Procedure Notes (Signed)
Performed by: Jenetta Downer Oxygen Delivery Method: Simple face mask

## 2015-02-19 NOTE — Anesthesia Preprocedure Evaluation (Signed)
Anesthesia Evaluation  Patient identified by MRN, date of birth, ID band Patient awake    Reviewed: Allergy & Precautions, NPO status , Patient's Chart, lab work & pertinent test results  Airway Mallampati: III  TM Distance: >3 FB     Dental  (+) Caps   Pulmonary shortness of breath and with exertion, asthma , sleep apnea , pneumonia -, resolved,    Pulmonary exam normal       Cardiovascular hypertension, Normal cardiovascular exam+ dysrhythmias Atrial Fibrillation     Neuro/Psych negative neurological ROS  negative psych ROS   GI/Hepatic Neg liver ROS, GERD-  Medicated and Controlled,  Endo/Other  negative endocrine ROS  Renal/GU negative Renal ROS  negative genitourinary   Musculoskeletal  (+) Arthritis -, Osteoarthritis,    Abdominal Normal abdominal exam  (+)   Peds negative pediatric ROS (+)  Hematology negative hematology ROS (+)   Anesthesia Other Findings   Reproductive/Obstetrics                             Anesthesia Physical Anesthesia Plan  ASA: III  Anesthesia Plan: MAC   Post-op Pain Management:    Induction: Intravenous  Airway Management Planned: Nasal Cannula  Additional Equipment:   Intra-op Plan:   Post-operative Plan:   Informed Consent: I have reviewed the patients History and Physical, chart, labs and discussed the procedure including the risks, benefits and alternatives for the proposed anesthesia with the patient or authorized representative who has indicated his/her understanding and acceptance.   Dental advisory given  Plan Discussed with: CRNA and Surgeon  Anesthesia Plan Comments:         Anesthesia Quick Evaluation

## 2015-02-20 LAB — SURGICAL PATHOLOGY

## 2015-05-13 ENCOUNTER — Encounter: Admission: RE | Payer: Self-pay | Source: Ambulatory Visit

## 2015-05-13 ENCOUNTER — Ambulatory Visit
Admission: RE | Admit: 2015-05-13 | Payer: Medicare Other | Source: Ambulatory Visit | Admitting: Unknown Physician Specialty

## 2015-05-13 SURGERY — COLONOSCOPY WITH PROPOFOL
Anesthesia: General

## 2015-06-23 DIAGNOSIS — H25019 Cortical age-related cataract, unspecified eye: Secondary | ICD-10-CM

## 2015-06-23 HISTORY — DX: Cortical age-related cataract, unspecified eye: H25.019

## 2015-06-28 ENCOUNTER — Encounter: Payer: Self-pay | Admitting: *Deleted

## 2015-07-01 ENCOUNTER — Ambulatory Visit
Admission: RE | Admit: 2015-07-01 | Payer: Medicare Other | Source: Ambulatory Visit | Admitting: Unknown Physician Specialty

## 2015-07-01 ENCOUNTER — Encounter: Admission: RE | Payer: Self-pay | Source: Ambulatory Visit

## 2015-07-01 SURGERY — COLONOSCOPY WITH PROPOFOL
Anesthesia: General

## 2015-07-26 ENCOUNTER — Encounter: Payer: Self-pay | Admitting: *Deleted

## 2015-07-26 NOTE — OR Nursing (Signed)
07/26/15 -  Patient has allergy to Neomycin.  Warning in Epic regarding Gentamicin risk.  Spoke with Dr. Vira Agar.  Order is to proceed with Gentamicin administration on 07/29/15.  Norlene Campbell, RN

## 2015-07-29 ENCOUNTER — Ambulatory Visit: Payer: Medicare Other | Admitting: Anesthesiology

## 2015-07-29 ENCOUNTER — Ambulatory Visit
Admission: RE | Admit: 2015-07-29 | Discharge: 2015-07-29 | Disposition: A | Discharge status: Home / Self Care | Payer: Medicare Other | Source: Ambulatory Visit | Attending: Unknown Physician Specialty | Admitting: Unknown Physician Specialty

## 2015-07-29 ENCOUNTER — Encounter
Admission: RE | Disposition: A | Discharge status: Home / Self Care | Payer: Self-pay | Source: Ambulatory Visit | Attending: Unknown Physician Specialty

## 2015-07-29 DIAGNOSIS — Z800 Family history of malignant neoplasm of digestive organs: Secondary | ICD-10-CM | POA: Diagnosis not present

## 2015-07-29 DIAGNOSIS — K219 Gastro-esophageal reflux disease without esophagitis: Secondary | ICD-10-CM | POA: Insufficient documentation

## 2015-07-29 DIAGNOSIS — G4730 Sleep apnea, unspecified: Secondary | ICD-10-CM | POA: Insufficient documentation

## 2015-07-29 DIAGNOSIS — K640 First degree hemorrhoids: Secondary | ICD-10-CM | POA: Diagnosis not present

## 2015-07-29 DIAGNOSIS — D124 Benign neoplasm of descending colon: Secondary | ICD-10-CM | POA: Diagnosis not present

## 2015-07-29 DIAGNOSIS — Z7951 Long term (current) use of inhaled steroids: Secondary | ICD-10-CM | POA: Diagnosis not present

## 2015-07-29 DIAGNOSIS — Z91048 Other nonmedicinal substance allergy status: Secondary | ICD-10-CM | POA: Diagnosis not present

## 2015-07-29 DIAGNOSIS — Z803 Family history of malignant neoplasm of breast: Secondary | ICD-10-CM | POA: Diagnosis not present

## 2015-07-29 DIAGNOSIS — M170 Bilateral primary osteoarthritis of knee: Secondary | ICD-10-CM | POA: Insufficient documentation

## 2015-07-29 DIAGNOSIS — Z8261 Family history of arthritis: Secondary | ICD-10-CM | POA: Diagnosis not present

## 2015-07-29 DIAGNOSIS — M810 Age-related osteoporosis without current pathological fracture: Secondary | ICD-10-CM | POA: Insufficient documentation

## 2015-07-29 DIAGNOSIS — Z885 Allergy status to narcotic agent status: Secondary | ICD-10-CM | POA: Diagnosis not present

## 2015-07-29 DIAGNOSIS — Z96651 Presence of right artificial knee joint: Secondary | ICD-10-CM | POA: Insufficient documentation

## 2015-07-29 DIAGNOSIS — Z8719 Personal history of other diseases of the digestive system: Secondary | ICD-10-CM | POA: Insufficient documentation

## 2015-07-29 DIAGNOSIS — K5730 Diverticulosis of large intestine without perforation or abscess without bleeding: Secondary | ICD-10-CM | POA: Diagnosis not present

## 2015-07-29 DIAGNOSIS — Z7901 Long term (current) use of anticoagulants: Secondary | ICD-10-CM | POA: Diagnosis not present

## 2015-07-29 DIAGNOSIS — Z85828 Personal history of other malignant neoplasm of skin: Secondary | ICD-10-CM | POA: Insufficient documentation

## 2015-07-29 DIAGNOSIS — Z79899 Other long term (current) drug therapy: Secondary | ICD-10-CM | POA: Diagnosis not present

## 2015-07-29 DIAGNOSIS — I10 Essential (primary) hypertension: Secondary | ICD-10-CM | POA: Insufficient documentation

## 2015-07-29 DIAGNOSIS — I4891 Unspecified atrial fibrillation: Secondary | ICD-10-CM | POA: Insufficient documentation

## 2015-07-29 DIAGNOSIS — Z888 Allergy status to other drugs, medicaments and biological substances status: Secondary | ICD-10-CM | POA: Insufficient documentation

## 2015-07-29 DIAGNOSIS — Z8249 Family history of ischemic heart disease and other diseases of the circulatory system: Secondary | ICD-10-CM | POA: Diagnosis not present

## 2015-07-29 DIAGNOSIS — E785 Hyperlipidemia, unspecified: Secondary | ICD-10-CM | POA: Insufficient documentation

## 2015-07-29 DIAGNOSIS — Z881 Allergy status to other antibiotic agents status: Secondary | ICD-10-CM | POA: Diagnosis not present

## 2015-07-29 DIAGNOSIS — J45909 Unspecified asthma, uncomplicated: Secondary | ICD-10-CM | POA: Insufficient documentation

## 2015-07-29 HISTORY — DX: Bilateral primary osteoarthritis of knee: M17.0

## 2015-07-29 HISTORY — PX: COLONOSCOPY WITH PROPOFOL: SHX5780

## 2015-07-29 HISTORY — DX: Hyperlipidemia, unspecified: E78.5

## 2015-07-29 HISTORY — DX: Varicella without complication: B01.9

## 2015-07-29 HISTORY — DX: Diverticulitis of intestine, part unspecified, without perforation or abscess without bleeding: K57.92

## 2015-07-29 HISTORY — DX: Carpal tunnel syndrome, unspecified upper limb: G56.00

## 2015-07-29 SURGERY — COLONOSCOPY WITH PROPOFOL
Anesthesia: General

## 2015-07-29 MED ORDER — VANCOMYCIN HCL IN DEXTROSE 1-5 GM/200ML-% IV SOLN
1000.0000 mg | Freq: Once | INTRAVENOUS | Status: AC
  Administered 2015-07-29 (×2): 1000 mg via INTRAVENOUS
  Filled 2015-07-29: qty 200

## 2015-07-29 MED ORDER — SODIUM CHLORIDE 0.9 % IV SOLN
INTRAVENOUS | Status: DC
Start: 2015-07-29 — End: 2015-07-29

## 2015-07-29 MED ORDER — GENTAMICIN SULFATE 40 MG/ML IJ SOLN
100.0000 mg | Freq: Once | INTRAVENOUS | Status: AC
  Administered 2015-07-29: 100 mg via INTRAVENOUS
  Filled 2015-07-29: qty 2.5

## 2015-07-29 MED ORDER — MIDAZOLAM HCL 5 MG/5ML IJ SOLN
INTRAMUSCULAR | Status: DC | PRN
  Administered 2015-07-29: 1 mg via INTRAVENOUS

## 2015-07-29 MED ORDER — SODIUM CHLORIDE 0.9 % IV SOLN
INTRAVENOUS | Status: DC
Start: 2015-07-29 — End: 2015-07-29
  Administered 2015-07-29: 08:00:00 via INTRAVENOUS
  Administered 2015-07-29: 1000 mL via INTRAVENOUS

## 2015-07-29 MED ORDER — FENTANYL CITRATE (PF) 100 MCG/2ML IJ SOLN
INTRAMUSCULAR | Status: DC | PRN
  Administered 2015-07-29: 50 ug via INTRAVENOUS

## 2015-07-29 MED ORDER — PROPOFOL 10 MG/ML IV BOLUS
INTRAVENOUS | Status: DC | PRN
  Administered 2015-07-29: 57.2 mg via INTRAVENOUS

## 2015-07-29 MED ORDER — GENTAMICIN IN SALINE 1-0.9 MG/ML-% IV SOLN
100.0000 mg | Freq: Once | INTRAVENOUS | Status: DC
  Filled 2015-07-29: qty 100

## 2015-07-29 MED ORDER — PROPOFOL 500 MG/50ML IV EMUL
INTRAVENOUS | Status: DC | PRN
  Administered 2015-07-29: 120 ug/kg/min via INTRAVENOUS

## 2015-07-29 NOTE — Anesthesia Preprocedure Evaluation (Addendum)
Anesthesia Evaluation  Patient identified by MRN, date of birth, ID band Patient awake    Reviewed: Allergy & Precautions, NPO status , Patient's Chart, lab work & pertinent test results  History of Anesthesia Complications Negative for: history of anesthetic complications  Airway Mallampati: III       Dental   Pulmonary asthma , sleep apnea and Continuous Positive Airway Pressure Ventilation ,           Cardiovascular hypertension, Pt. on medications and Pt. on home beta blockers + dysrhythmias Atrial Fibrillation      Neuro/Psych    GI/Hepatic GERD (hx, no problems recently)  ,  Endo/Other    Renal/GU      Musculoskeletal  (+) Arthritis , Osteoarthritis,    Abdominal   Peds  Hematology   Anesthesia Other Findings   Reproductive/Obstetrics                            Anesthesia Physical Anesthesia Plan  ASA: III  Anesthesia Plan: General   Post-op Pain Management:    Induction: Intravenous  Airway Management Planned: Nasal Cannula  Additional Equipment:   Intra-op Plan:   Post-operative Plan:   Informed Consent: I have reviewed the patients History and Physical, chart, labs and discussed the procedure including the risks, benefits and alternatives for the proposed anesthesia with the patient or authorized representative who has indicated his/her understanding and acceptance.     Plan Discussed with:   Anesthesia Plan Comments:         Anesthesia Quick Evaluation

## 2015-07-29 NOTE — H&P (Signed)
Primary Care Physician:  [name], MD Primary Gastroenterologist:  Dr. Vira Agar  Pre-Procedure History & Physical: HPI:  Laura Gonzalez is a 75 y.o. female is here for an colonoscopy.   Past Medical History  Diagnosis Date  . Dysrhythmia     afib  . Pneumonia   . Asthma   . Numbness and tingling     arm to leg  . Arthritis     wrist and knees  . Hernia, umbilical   . Shortness of breath     only with astma attacks  . Sleep apnea   . Cancer ([place])     skin cancer  . Osteoporosis   . Hypertension   . GERD (gastroesophageal reflux disease)   . Diverticulosis   . Carpal tunnel syndrome   . Chicken pox   . Degenerative arthritis of knee, bilateral   . Diverticulitis   . Hyperlipidemia     Past Surgical History  Procedure Laterality Date  . Tonsillectomy  1960  . Umbilical hernia repair  [id number]  . Wisdom tooth extraction  1963  . Wrist surgery Right 1998    external fixator  . Colonscopy  2000,2007,2012  . Rotator cuff repair Bilateral right- 2009, left 2011  . Skin cancer excision  2009    back of neck and right cheek  . Lipoma excision Right 2010    back  . Varicose vein surgery Left 04/2009 rt 2011  . Anterior cervical decomp/discectomy fusion N/A 08/09/2012    Procedure: ANTERIOR CERVICAL DECOMPRESSION/DISCECTOMY FUSION 2 LEVELS;  Surgeon: [name], MD;  Location: [facility]S;  Service: Neurosurgery;  Laterality: N/A;  C3-4 C4-5 Anterior cervical decompression/diskectomy/fusion/LifeNet Bone/Trestle plate  . Hernia repair    . Breast surgery    . Joint replacement Right     Total Knee Replacement  . Mastectomy partial / lumpectomy Left 1980s  . Tracheostomy    . Colonoscopy    . Esophagogastroduodenoscopy      Prior to Admission medications   Medication Sig Start Date End Date Taking? Authorizing Provider  acetaminophen (TYLENOL) 325 MG tablet Take 650 mg by mouth every 6 (six) hours as needed for mild pain, moderate pain or headache.    Yes Historical Provider, MD  albuterol (PROVENTIL HFA;VENTOLIN HFA) 108 (90 BASE) MCG/ACT inhaler Inhale 2 puffs into the lungs every 6 (six) hours as needed. For shortness of breath   Yes Historical Provider, MD  bisacodyl (BISACODYL) 5 MG EC tablet Take 5 mg by mouth daily as needed for moderate constipation.   Yes Historical Provider, MD  Calcium Citrate (CITRACAL PO) Take 1 tablet by mouth 2 (two) times daily.   Yes Historical Provider, MD  Cholecalciferol (VITAMIN D3) 5000 UNITS CAPS Take 1 capsule by mouth daily.   Yes Historical Provider, MD  diltiazem (CARDIZEM CD) 300 MG 24 hr capsule Take 300 mg by mouth daily.   Yes Historical Provider, MD  diltiazem (CARDIZEM) 60 MG tablet Take 120 mg by mouth every 6 (six) hours as needed. Takes if has palpitation after taking diltiazem 300mg  dose   Yes Historical Provider, MD  ezetimibe (ZETIA) 10 MG tablet Take 10 mg by mouth at bedtime.   Yes Historical Provider, MD  fluticasone (FLONASE) 50 MCG/ACT nasal spray Place 1 spray into both nostrils 2 (two) times daily.   Yes Historical Provider, MD  hydrochlorothiazide (HYDRODIURIL) 25 MG tablet Take 25 mg by mouth daily.   Yes Historical Provider, MD  ipratropium-albuterol (DUONEB)  0.5-2.5 (3) MG/3ML SOLN Take 3 mLs by nebulization every 6 (six) hours as needed.   Yes Historical Provider, MD  loratadine (CLARITIN) 10 MG tablet Take 10 mg by mouth daily.   Yes Historical Provider, MD  magnesium oxide (MAG-OX) 400 MG tablet Take 400 mg by mouth daily.   Yes Historical Provider, MD  MEGARED OMEGA-3 KRILL OIL 500 MG CAPS Take 1 capsule by mouth daily.   Yes Historical Provider, MD  metoprolol tartrate (LOPRESSOR) 25 MG tablet Take 25 mg by mouth 2 (two) times daily.   Yes Historical Provider, MD  montelukast (SINGULAIR) 10 MG tablet Take 10 mg by mouth at bedtime.   Yes Historical Provider, MD  Multiple Vitamin (MULTIVITAMIN WITH MINERALS) TABS Take 1 tablet by mouth daily.   Yes Historical Provider, MD   Multiple Vitamins-Minerals (PRESERVISION AREDS PO) Take 1 tablet by mouth daily.   Yes Historical Provider, MD  potassium chloride SA (K-DUR,KLOR-CON) 20 MEQ tablet Take 20 mEq by mouth 3 (three) times daily.   Yes Historical Provider, MD  vitamin B-12 (CYANOCOBALAMIN) 1000 MCG tablet Take 3,000 mcg by mouth daily.   Yes Historical Provider, MD  warfarin (COUMADIN) 2 MG tablet Take 1 tablet (2 mg total) by mouth daily. Patient taking differently: Take 2-4 mg by mouth See admin instructions. Alternating 2mg  and 4mg  every other day 12/20/14  Yes Fritzi Mandes, MD    Allergies as of 07/08/2015 - Review Complete 06/28/2015  Allergen Reaction Noted  . Amoxicillin Other (See Comments) 07/28/2012  . Benzocaine-menthol  12/11/2014  . Biafine [wound dressings] Swelling 12/11/2014  . Chloraseptic sore throat [acetaminophen] Other (See Comments) 07/28/2012  . Fosamax [alendronate sodium]  12/11/2014  . Neosporin [neomycin-bacitracin zn-polymyx] Other (See Comments) 07/28/2012  . Other  07/28/2012  . Oxycodone Other (See Comments) 07/28/2012  . Statins Other (See Comments) 07/28/2012  . Tape Other (See Comments) 07/28/2012    Family History  Problem Relation Age of Onset  . Breast cancer Mother   . Pulmonary embolism Mother   . Arthritis Mother   . Hypertension Mother   . Heart attack Mother   . Stomach cancer Maternal Aunt   . Throat cancer Maternal Uncle   . Stomach cancer Maternal Grandfather     Social History   Social History  . Marital Status: Single    Spouse Name: N/A  . Number of Children: N/A  . Years of Education: N/A   Occupational History  . Not on file.   Social History Main Topics  . Smoking status: Never Smoker   . Smokeless tobacco: Not on file  . Alcohol Use: No  . Drug Use: No  . Sexual Activity: Not Currently   Other Topics Concern  . Not on file   Social History Narrative    Review of Systems: See HPI, otherwise negative ROS  Physical Exam: BP  107/56 mmHg  Pulse 77  Temp(Src) 96.9 F (36.1 C) (Tympanic)  Resp 16  Ht 5\' 5"  (1.651 m)  Wt 114.306 kg (252 lb)  BMI 41.93 kg/m2  SpO2 96% General:   Alert,  pleasant and cooperative in NAD Head:  Normocephalic and atraumatic. Neck:  Supple; no masses or thyromegaly. Lungs:  Clear throughout to auscultation.    Heart:  Regular rate and rhythm. Abdomen:  Soft, nontender and nondistended. Normal bowel sounds, without guarding, and without rebound.  Very obese Neurologic:  Alert and  oriented x4;  grossly normal neurologically.  Impression/Plan: Laura Gonzalez is here for an  colonoscopy to be performed for follow up diverticulitis  Risks, benefits, limitations, and alternatives regarding  colonoscopy have been reviewed with the patient.  Questions have been answered.  All parties agreeable.   Gaylyn Cheers, MD  07/29/2015, 8:03 AM

## 2015-07-29 NOTE — Op Note (Signed)
[facility] Gastroenterology Patient Name: Laura Gonzalez Procedure Date: 07/29/2015 8:00 AM MRN: [id number] Account #: [id number] Date of Birth: [birth year] Admit Type: Outpatient Age: 75 Room: [facility] ENDO ROOM 1 Gender: Female Note Status: Finalized Procedure:         Colonoscopy Indications:       follow up diverticulitis. Providers:         [name], MD Referring MD:      Hewitt Blade. Sarina Ser, MD (Referring MD) Medicines:         Propofol per Anesthesia Complications:     No immediate complications. Procedure:         Pre-Anesthesia Assessment:                    - After reviewing the risks and benefits, the patient was                     deemed in satisfactory condition to undergo the procedure.                    After obtaining informed consent, the colonoscope was                     passed under direct vision. Throughout the procedure, the                     patient's blood pressure, pulse, and oxygen saturations                     were monitored continuously. The Colonoscope was                     introduced through the anus and advanced to the the cecum,                     identified by appendiceal orifice and ileocecal valve. The                     colonoscopy was performed without difficulty. The patient                     tolerated the procedure well. The quality of the bowel                     preparation was adequate to identify polyps. Findings:      A 8 mm polyp was found in the distal descending colon. The polyp was       sessile. The polyp was removed with a hot snare. Resection and retrieval       were complete. To prevent bleeding after the polypectomy, two hemostatic       clips were successfully placed.      Multiple small and large-mouthed diverticula were found in the sigmoid       colon, in the descending colon, at the splenic flexure, in the       transverse colon, at the hepatic flexure and in the ascending colon.  Internal hemorrhoids were found during endoscopy. The hemorrhoids were       small and Grade I (internal hemorrhoids that do not prolapse). Impression:        - One 8 mm polyp in the distal descending colon. Resected                     and retrieved.  Clips were placed.                    - Diverticulosis in the sigmoid colon, in the descending                     colon, at the splenic flexure, in the transverse colon, at                     the hepatic flexure and in the ascending colon.                    - Internal hemorrhoids. Recommendation:    - Await pathology results. [name], MD 07/29/2015 8:35:12 AM This report has been signed electronically. Number of Addenda: 0 Note Initiated On: 07/29/2015 8:00 AM Scope Withdrawal Time: 0 hours 11 minutes 7 seconds  Total Procedure Duration: 0 hours 16 minutes 54 seconds       [facility]

## 2015-07-29 NOTE — Transfer of Care (Signed)
Immediate Anesthesia Transfer of Care Note  Patient: Laura Gonzalez  Procedure(s) Performed: Procedure(s): COLONOSCOPY WITH PROPOFOL (N/A)  Patient Location: PACU  Anesthesia Type:General  Level of Consciousness: sedated  Airway & Oxygen Therapy: Patient Spontanous Breathing  Post-op Assessment: Report given to RN  Post vital signs: Reviewed  Last Vitals:  Filed Vitals:   07/29/15 0630 07/29/15 0837  BP: 107/56   Pulse: 77 60  Temp: 36.1 C   Resp: 16 17    Complications: No apparent anesthesia complications

## 2015-07-29 NOTE — Anesthesia Postprocedure Evaluation (Signed)
Anesthesia Post Note  Patient: Laura Gonzalez  Procedure(s) Performed: Procedure(s) (LRB): COLONOSCOPY WITH PROPOFOL (N/A)  Patient location during evaluation: Endoscopy Anesthesia Type: General Level of consciousness: awake and alert Pain management: pain level controlled Vital Signs Assessment: post-procedure vital signs reviewed and stable Respiratory status: spontaneous breathing and respiratory function stable Cardiovascular status: stable Anesthetic complications: no    Last Vitals:  Filed Vitals:   07/29/15 0630 07/29/15 0837  BP: 107/56   Pulse: 77 60  Temp: 36.1 C 37 C  Resp: 16 17    Last Pain: There were no vitals filed for this visit.               KEPHART,WILLIAM K

## 2015-07-30 ENCOUNTER — Encounter: Payer: Self-pay | Admitting: Unknown Physician Specialty

## 2015-07-30 LAB — SURGICAL PATHOLOGY

## 2015-09-03 ENCOUNTER — Other Ambulatory Visit: Payer: Self-pay | Admitting: Internal Medicine

## 2015-09-03 ENCOUNTER — Ambulatory Visit
Admission: RE | Admit: 2015-09-03 | Discharge: 2015-09-03 | Disposition: A | Discharge status: Home / Self Care | Payer: Medicare Other | Source: Ambulatory Visit | Attending: Internal Medicine | Admitting: Internal Medicine

## 2015-09-03 DIAGNOSIS — Z1231 Encounter for screening mammogram for malignant neoplasm of breast: Secondary | ICD-10-CM

## 2016-03-24 ENCOUNTER — Inpatient Hospital Stay
Admission: EM | Admit: 2016-03-24 | Discharge: 2016-04-01 | DRG: 189 | Disposition: A | Discharge status: Home Health | Payer: Medicare Other | Attending: Internal Medicine | Admitting: Internal Medicine

## 2016-03-24 ENCOUNTER — Emergency Department: Payer: Medicare Other

## 2016-03-24 ENCOUNTER — Encounter: Payer: Self-pay | Admitting: Emergency Medicine

## 2016-03-24 DIAGNOSIS — R262 Difficulty in walking, not elsewhere classified: Secondary | ICD-10-CM

## 2016-03-24 DIAGNOSIS — Z981 Arthrodesis status: Secondary | ICD-10-CM

## 2016-03-24 DIAGNOSIS — J45901 Unspecified asthma with (acute) exacerbation: Secondary | ICD-10-CM | POA: Diagnosis present

## 2016-03-24 DIAGNOSIS — J189 Pneumonia, unspecified organism: Secondary | ICD-10-CM

## 2016-03-24 DIAGNOSIS — R000 Tachycardia, unspecified: Secondary | ICD-10-CM | POA: Diagnosis not present

## 2016-03-24 DIAGNOSIS — I110 Hypertensive heart disease with heart failure: Secondary | ICD-10-CM | POA: Diagnosis present

## 2016-03-24 DIAGNOSIS — J209 Acute bronchitis, unspecified: Secondary | ICD-10-CM | POA: Diagnosis present

## 2016-03-24 DIAGNOSIS — I952 Hypotension due to drugs: Secondary | ICD-10-CM | POA: Diagnosis not present

## 2016-03-24 DIAGNOSIS — I480 Paroxysmal atrial fibrillation: Secondary | ICD-10-CM | POA: Diagnosis present

## 2016-03-24 DIAGNOSIS — M6281 Muscle weakness (generalized): Secondary | ICD-10-CM

## 2016-03-24 DIAGNOSIS — R2681 Unsteadiness on feet: Secondary | ICD-10-CM

## 2016-03-24 DIAGNOSIS — Z96651 Presence of right artificial knee joint: Secondary | ICD-10-CM | POA: Diagnosis present

## 2016-03-24 DIAGNOSIS — J4520 Mild intermittent asthma, uncomplicated: Secondary | ICD-10-CM | POA: Diagnosis present

## 2016-03-24 DIAGNOSIS — J9621 Acute and chronic respiratory failure with hypoxia: Secondary | ICD-10-CM | POA: Diagnosis not present

## 2016-03-24 DIAGNOSIS — E782 Mixed hyperlipidemia: Secondary | ICD-10-CM | POA: Diagnosis present

## 2016-03-24 DIAGNOSIS — Z6841 Body Mass Index (BMI) 40.0 and over, adult: Secondary | ICD-10-CM

## 2016-03-24 DIAGNOSIS — J9601 Acute respiratory failure with hypoxia: Secondary | ICD-10-CM | POA: Diagnosis not present

## 2016-03-24 DIAGNOSIS — Z7901 Long term (current) use of anticoagulants: Secondary | ICD-10-CM

## 2016-03-24 DIAGNOSIS — R739 Hyperglycemia, unspecified: Secondary | ICD-10-CM | POA: Diagnosis present

## 2016-03-24 DIAGNOSIS — E669 Obesity, unspecified: Secondary | ICD-10-CM | POA: Diagnosis present

## 2016-03-24 DIAGNOSIS — K219 Gastro-esophageal reflux disease without esophagitis: Secondary | ICD-10-CM | POA: Diagnosis present

## 2016-03-24 DIAGNOSIS — R0600 Dyspnea, unspecified: Secondary | ICD-10-CM

## 2016-03-24 DIAGNOSIS — I5031 Acute diastolic (congestive) heart failure: Secondary | ICD-10-CM | POA: Diagnosis present

## 2016-03-24 DIAGNOSIS — R079 Chest pain, unspecified: Secondary | ICD-10-CM | POA: Diagnosis not present

## 2016-03-24 DIAGNOSIS — I248 Other forms of acute ischemic heart disease: Secondary | ICD-10-CM | POA: Diagnosis present

## 2016-03-24 DIAGNOSIS — M810 Age-related osteoporosis without current pathological fracture: Secondary | ICD-10-CM | POA: Diagnosis present

## 2016-03-24 DIAGNOSIS — R6510 Systemic inflammatory response syndrome (SIRS) of non-infectious origin without acute organ dysfunction: Secondary | ICD-10-CM

## 2016-03-24 DIAGNOSIS — A419 Sepsis, unspecified organism: Secondary | ICD-10-CM | POA: Diagnosis not present

## 2016-03-24 DIAGNOSIS — I959 Hypotension, unspecified: Secondary | ICD-10-CM | POA: Diagnosis present

## 2016-03-24 DIAGNOSIS — G4730 Sleep apnea, unspecified: Secondary | ICD-10-CM | POA: Diagnosis present

## 2016-03-24 DIAGNOSIS — J9600 Acute respiratory failure, unspecified whether with hypoxia or hypercapnia: Secondary | ICD-10-CM

## 2016-03-24 DIAGNOSIS — N179 Acute kidney failure, unspecified: Secondary | ICD-10-CM | POA: Diagnosis present

## 2016-03-24 DIAGNOSIS — Z79899 Other long term (current) drug therapy: Secondary | ICD-10-CM

## 2016-03-24 DIAGNOSIS — I950 Idiopathic hypotension: Secondary | ICD-10-CM | POA: Diagnosis not present

## 2016-03-24 DIAGNOSIS — Z91048 Other nonmedicinal substance allergy status: Secondary | ICD-10-CM

## 2016-03-24 DIAGNOSIS — Z888 Allergy status to other drugs, medicaments and biological substances status: Secondary | ICD-10-CM

## 2016-03-24 DIAGNOSIS — I482 Chronic atrial fibrillation: Secondary | ICD-10-CM | POA: Diagnosis present

## 2016-03-24 DIAGNOSIS — Z881 Allergy status to other antibiotic agents status: Secondary | ICD-10-CM

## 2016-03-24 DIAGNOSIS — Z8249 Family history of ischemic heart disease and other diseases of the circulatory system: Secondary | ICD-10-CM

## 2016-03-24 LAB — URINALYSIS COMPLETE WITH MICROSCOPIC (ARMC ONLY)
Bilirubin Urine: NEGATIVE
Glucose, UA: NEGATIVE mg/dL
Hgb urine dipstick: NEGATIVE
Ketones, ur: NEGATIVE mg/dL
Nitrite: NEGATIVE
Protein, ur: NEGATIVE mg/dL
Specific Gravity, Urine: 1.032 — ABNORMAL HIGH (ref 1.005–1.030)
pH: 6 (ref 5.0–8.0)

## 2016-03-24 LAB — LIPASE, BLOOD: Lipase: 20 U/L (ref 11–51)

## 2016-03-24 LAB — TROPONIN I: Troponin I: <0.03 ng/mL (ref <0.03)

## 2016-03-24 LAB — CBC WITH DIFFERENTIAL/PLATELET
Basophils Absolute: 0 10*3/uL (ref 0–0.1)
Basophils Relative: 0 %
Eosinophils Absolute: 0 10*3/uL (ref 0–0.7)
Eosinophils Relative: 0 %
HCT: 46.2 % (ref 35.0–47.0)
HEMOGLOBIN: 15.5 g/dL (ref 12.0–16.0)
LYMPHS ABS: 0.8 10*3/uL — AB (ref 1.0–3.6)
LYMPHS PCT: 4 %
MCH: 31.6 pg (ref 26.0–34.0)
MCHC: 33.5 g/dL (ref 32.0–36.0)
MCV: 94.5 fL (ref 80.0–100.0)
Monocytes Absolute: 1.3 10*3/uL — ABNORMAL HIGH (ref 0.2–0.9)
Monocytes Relative: 7 %
NEUTROS PCT: 89 %
Neutro Abs: 16.2 10*3/uL — ABNORMAL HIGH (ref 1.4–6.5)
Platelets: 218 10*3/uL (ref 150–440)
RBC: 4.89 MIL/uL (ref 3.80–5.20)
RDW: 14 % (ref 11.5–14.5)
WBC: 18.3 10*3/uL — AB (ref 3.6–11.0)

## 2016-03-24 LAB — COMPREHENSIVE METABOLIC PANEL
ALT: 28 U/L (ref 14–54)
ANION GAP: 10 (ref 5–15)
AST: 34 U/L (ref 15–41)
Albumin: 3.8 g/dL (ref 3.5–5.0)
Alkaline Phosphatase: 71 U/L (ref 38–126)
BUN: 19 mg/dL (ref 6–20)
CHLORIDE: 104 mmol/L (ref 101–111)
CO2: 25 mmol/L (ref 22–32)
Calcium: 9.9 mg/dL (ref 8.9–10.3)
Creatinine, Ser: 0.96 mg/dL (ref 0.44–1.00)
GFR calc non Af Amer: 57 mL/min — ABNORMAL LOW (ref >60)
Glucose, Bld: 149 mg/dL — ABNORMAL HIGH (ref 65–99)
Potassium: 4.4 mmol/L (ref 3.5–5.1)
SODIUM: 139 mmol/L (ref 135–145)
Total Bilirubin: 0.8 mg/dL (ref 0.3–1.2)
Total Protein: 7.5 g/dL (ref 6.5–8.1)

## 2016-03-24 LAB — MRSA PCR SCREENING: MRSA by PCR: NEGATIVE

## 2016-03-24 LAB — FIBRIN DERIVATIVES D-DIMER (ARMC ONLY): Fibrin derivatives D-dimer (ARMC): 439 (ref 0–499)

## 2016-03-24 LAB — GLUCOSE, CAPILLARY: GLUCOSE-CAPILLARY: 117 mg/dL — AB (ref 65–99)

## 2016-03-24 LAB — PROTIME-INR
INR: 2.36
Prothrombin Time: 26.2 s — ABNORMAL HIGH (ref 11.4–15.2)

## 2016-03-24 LAB — AMYLASE: AMYLASE: 36 U/L (ref 28–100)

## 2016-03-24 LAB — LACTIC ACID, PLASMA: Lactic Acid, Venous: 2.8 mmol/L (ref 0.5–1.9)

## 2016-03-24 LAB — PROCALCITONIN

## 2016-03-24 MED ORDER — NOREPINEPHRINE BITARTRATE 1 MG/ML IV SOLN: 0.0000 ug/min | Freq: Once | INTRAVENOUS | Status: DC

## 2016-03-24 MED ORDER — BISACODYL 5 MG PO TBEC
5.0000 mg | DELAYED_RELEASE_TABLET | Freq: Every day | ORAL | Status: DC | PRN
  Administered 2016-03-27: 5 mg via ORAL
  Filled 2016-03-24: qty 1

## 2016-03-24 MED ORDER — DEXTROSE 5 % IV SOLN: 1.0000 g | Freq: Once | INTRAVENOUS | Status: DC

## 2016-03-24 MED ORDER — PHENYLEPHRINE HCL 10 MG/ML IJ SOLN
0.0000 ug/min | INTRAMUSCULAR | Status: DC
  Filled 2016-03-24: qty 1

## 2016-03-24 MED ORDER — IPRATROPIUM-ALBUTEROL 0.5-2.5 (3) MG/3ML IN SOLN
RESPIRATORY_TRACT | Status: AC
  Administered 2016-03-24: 3 mL via RESPIRATORY_TRACT
  Filled 2016-03-24: qty 3

## 2016-03-24 MED ORDER — MONTELUKAST SODIUM 10 MG PO TABS
10.0000 mg | ORAL_TABLET | Freq: Every day | ORAL | Status: DC
  Administered 2016-03-24 – 2016-03-31 (×8): 10 mg via ORAL
  Filled 2016-03-24 (×8): qty 1

## 2016-03-24 MED ORDER — WARFARIN - PHARMACIST DOSING INPATIENT
Freq: Every day | Status: DC
  Administered 2016-03-24 – 2016-03-27 (×2)

## 2016-03-24 MED ORDER — WARFARIN SODIUM 3 MG PO TABS
3.0000 mg | ORAL_TABLET | Freq: Every day | ORAL | Status: DC
  Administered 2016-03-24 – 2016-03-26 (×3): 3 mg via ORAL
  Filled 2016-03-24 (×4): qty 1

## 2016-03-24 MED ORDER — IPRATROPIUM-ALBUTEROL 0.5-2.5 (3) MG/3ML IN SOLN
3.0000 mL | Freq: Four times a day (QID) | RESPIRATORY_TRACT | Status: DC
  Administered 2016-03-24 – 2016-03-25 (×5): 3 mL via RESPIRATORY_TRACT
  Filled 2016-03-24 (×3): qty 3

## 2016-03-24 MED ORDER — LEVOFLOXACIN IN D5W 750 MG/150ML IV SOLN
INTRAVENOUS | Status: AC
  Administered 2016-03-24: 750 mg via INTRAVENOUS
  Filled 2016-03-24: qty 150

## 2016-03-24 MED ORDER — METOPROLOL TARTRATE 5 MG/5ML IV SOLN
2.5000 mg | INTRAVENOUS | Status: DC | PRN
  Administered 2016-03-25: 5 mg via INTRAVENOUS
  Filled 2016-03-24: qty 5

## 2016-03-24 MED ORDER — ACETAMINOPHEN 325 MG PO TABS: 650.0000 mg | ORAL_TABLET | ORAL | Status: DC | PRN

## 2016-03-24 MED ORDER — ASPIRIN EC 325 MG PO TBEC
325.0000 mg | DELAYED_RELEASE_TABLET | Freq: Every day | ORAL | Status: DC
  Administered 2016-03-25 – 2016-03-27 (×3): 325 mg via ORAL
  Filled 2016-03-24 (×3): qty 1

## 2016-03-24 MED ORDER — TRAMADOL HCL 50 MG PO TABS
50.0000 mg | ORAL_TABLET | Freq: Four times a day (QID) | ORAL | Status: DC | PRN
  Administered 2016-03-24 – 2016-03-27 (×3): 50 mg via ORAL
  Filled 2016-03-24 (×3): qty 1

## 2016-03-24 MED ORDER — ONDANSETRON HCL 4 MG/2ML IJ SOLN: 4.0000 mg | Freq: Four times a day (QID) | INTRAMUSCULAR | Status: DC | PRN

## 2016-03-24 MED ORDER — NOREPINEPHRINE 4 MG/250ML-% IV SOLN: 0.0000 ug/min | INTRAVENOUS | Status: DC

## 2016-03-24 MED ORDER — FAMOTIDINE IN NACL 20-0.9 MG/50ML-% IV SOLN
INTRAVENOUS | Status: AC
  Filled 2016-03-24: qty 50

## 2016-03-24 MED ORDER — DEXTROSE 5 % IV SOLN
500.0000 mg | Freq: Once | INTRAVENOUS | Status: AC
  Administered 2016-03-24: 500 mg via INTRAVENOUS
  Filled 2016-03-24: qty 500

## 2016-03-24 MED ORDER — METOPROLOL TARTRATE 5 MG/5ML IV SOLN
5.0000 mg | Freq: Once | INTRAVENOUS | Status: AC
  Administered 2016-03-24: 5 mg via INTRAVENOUS

## 2016-03-24 MED ORDER — SODIUM CHLORIDE 0.9 % IV BOLUS (SEPSIS)
1000.0000 mL | Freq: Once | INTRAVENOUS | Status: AC
  Administered 2016-03-24: 1000 mL via INTRAVENOUS

## 2016-03-24 MED ORDER — NOREPINEPHRINE 4 MG/250ML-% IV SOLN
INTRAVENOUS | Status: AC
  Administered 2016-03-24: 2 ug/min
  Filled 2016-03-24: qty 250

## 2016-03-24 MED ORDER — IOPAMIDOL (ISOVUE-370) INJECTION 76%
100.0000 mL | Freq: Once | INTRAVENOUS | Status: AC | PRN
  Administered 2016-03-24: 100 mL via INTRAVENOUS

## 2016-03-24 MED ORDER — ASPIRIN EC 325 MG PO TBEC: 325.0000 mg | DELAYED_RELEASE_TABLET | Freq: Every day | ORAL | Status: DC

## 2016-03-24 MED ORDER — SODIUM CHLORIDE 0.9 % IV SOLN: 250.0000 mL | INTRAVENOUS | Status: DC | PRN

## 2016-03-24 MED ORDER — FAMOTIDINE IN NACL 20-0.9 MG/50ML-% IV SOLN
20.0000 mg | Freq: Two times a day (BID) | INTRAVENOUS | Status: DC
  Administered 2016-03-24 – 2016-03-25 (×2): 20 mg via INTRAVENOUS
  Filled 2016-03-24 (×2): qty 50

## 2016-03-24 MED ORDER — LEVOFLOXACIN IN D5W 750 MG/150ML IV SOLN
750.0000 mg | INTRAVENOUS | Status: DC
  Administered 2016-03-24 – 2016-03-25 (×2): 750 mg via INTRAVENOUS
  Filled 2016-03-24 (×2): qty 150

## 2016-03-24 NOTE — ED Notes (Signed)
Zoll pads placed on patient.  

## 2016-03-24 NOTE — ED Triage Notes (Signed)
Patient presents to the ED via EMS from home.  Patient reports centralized chest pain that began last night around midnight.  Patient reports heaviness/pressure to central chest that radiates into her back and up her neck.  Patient reports shortness of breath with history of asthma.  Patient has history of afib and heart rate is 140s during triage.  Patient is alert and oriented and in no obvious distress at this time.  Speaking in full sentences.

## 2016-03-24 NOTE — Consult Note (Signed)
[facility] Cardiology Consultation Note  Patient ID: Laura Gonzalez, MRN: [id number], DOB/AGE: 1940/08/03 75 y.o. Admit date: 03/24/2016   Date of Consult: 03/24/2016 Primary Physician: [name], MD Primary Cardiologist:   Fath  Chief Complaint:  Chief Complaint  Patient presents with  . Chest Pain   Reason for Consult: chest pain with abnormal EKG  HPI: 75 y.o. female with known paroxysmal nonvalvular atrial fibrillation essential hypertension mixed hyperlipidemia having had new onset of significant episode of substernal chest discomfort radiating into her neck as well as back for the last 12 hours. She claims that it started and feels worse when deep breathing and improves a slight amount at when not. This is relatively constant in the 6 out of 10 all night long. She comes in with some palpitations and an EKG currently showing sinus tachycardia with the nonspecific ST changes. There is no apparent tenderness significant criteria at this time for an ST elevation myocardial infarction. The patient has not had any other significant symptoms including diaphoresis or shortness of breath congestive heart failure type symptoms at this time despite tachycardia. White blood cell count appears to be elevated consistent with possible infection. Initial troponin is normal consistent with the current diagnosis of chest pain from possible infection rather than acute coronary syndrome due to the lasting over the last 12 hours. The patient is comfortable with hydration at this time and blood pressure is stable depressed right heart rate continuing to be 140 bpm. She has been on warfarin at this time without current bleeding complications and some concerns that the patient would have bleeding complications with further intervention  Past Medical History:  Diagnosis Date  . Arthritis    wrist and knees  . Asthma   . Cancer ([place])    skin cancer  . Carpal tunnel syndrome   . Chicken pox   .  Degenerative arthritis of knee, bilateral   . Diverticulitis   . Diverticulosis   . Dysrhythmia    afib  . GERD (gastroesophageal reflux disease)   . Hernia, umbilical   . Hyperlipidemia   . Hypertension   . Numbness and tingling    arm to leg  . Osteoporosis   . Pneumonia   . Shortness of breath    only with astma attacks  . Sleep apnea       Surgical History:  Past Surgical History:  Procedure Laterality Date  . ANTERIOR CERVICAL DECOMP/DISCECTOMY FUSION N/A 08/09/2012   Procedure: ANTERIOR CERVICAL DECOMPRESSION/DISCECTOMY FUSION 2 LEVELS;  Surgeon: [name], MD;  Location: [place] NEURO ORS;  Service: Neurosurgery;  Laterality: N/A;  C3-4 C4-5 Anterior cervical decompression/diskectomy/fusion/LifeNet Bone/Trestle plate  . BREAST BIOPSY Left 1984   EXCISIONAL - NEG  . BREAST BIOPSY Left 1987   EXCISIONAL - NEG  . BREAST SURGERY    . COLONOSCOPY    . COLONOSCOPY WITH PROPOFOL N/A 07/29/2015   Procedure: COLONOSCOPY WITH PROPOFOL;  Surgeon: [name], MD;  Location: [facility] END[facility]COPY;  Service: Endoscopy;  Laterality: N/A;  . colonscopy  2000,2007,2012  . ESOPHAGOGASTRODUODENOSCOPY    . HERNIA REPAIR    . JOINT REPLACEMENT Right    Total Knee Replacement  . LIPOMA EXCISION Right 2010   back  . MASTECTOMY PARTIAL / LUMPECTOMY Left 1980s  . ROTATOR CUFF REPAIR Bilateral right- 2009, left 2011  . SKIN CANCER EXCISION  2009   back of neck and right cheek  . TONSILLECTOMY  1960  . TRACHEOSTOMY    .  UMBILICAL HERNIA REPAIR  [id number]  . VARICOSE VEIN SURGERY Left 04/2009 rt 2011  . [place]  . WRIST SURGERY Right 1998   external fixator     Home Meds: Prior to Admission medications   Medication Sig Start Date End Date Taking? Authorizing Provider  acetaminophen (TYLENOL) 325 MG tablet Take 650 mg by mouth every 6 (six) hours as needed for mild pain, moderate pain or headache.    Historical Provider, MD  albuterol (PROVENTIL HFA;VENTOLIN HFA)  108 (90 BASE) MCG/ACT inhaler Inhale 2 puffs into the lungs every 6 (six) hours as needed. For shortness of breath    Historical Provider, MD  bisacodyl (BISACODYL) 5 MG EC tablet Take 5 mg by mouth daily as needed for moderate constipation.    Historical Provider, MD  Calcium Citrate (CITRACAL PO) Take 1 tablet by mouth 2 (two) times daily.    Historical Provider, MD  Cholecalciferol (VITAMIN D3) 5000 UNITS CAPS Take 1 capsule by mouth daily.    Historical Provider, MD  diltiazem (CARDIZEM CD) 300 MG 24 hr capsule Take 300 mg by mouth daily.    Historical Provider, MD  diltiazem (CARDIZEM) 60 MG tablet Take 120 mg by mouth every 6 (six) hours as needed. Takes if has palpitation after taking diltiazem 300mg  dose    Historical Provider, MD  ezetimibe (ZETIA) 10 MG tablet Take 10 mg by mouth at bedtime.    Historical Provider, MD  fluticasone (FLONASE) 50 MCG/ACT nasal spray Place 1 spray into both nostrils 2 (two) times daily.    Historical Provider, MD  hydrochlorothiazide (HYDRODIURIL) 25 MG tablet Take 25 mg by mouth daily.    Historical Provider, MD  ipratropium-albuterol (DUONEB) 0.5-2.5 (3) MG/3ML SOLN Take 3 mLs by nebulization every 6 (six) hours as needed.    Historical Provider, MD  loratadine (CLARITIN) 10 MG tablet Take 10 mg by mouth daily.    Historical Provider, MD  magnesium oxide (MAG-OX) 400 MG tablet Take 400 mg by mouth daily.    Historical Provider, MD  MEGARED OMEGA-3 KRILL OIL 500 MG CAPS Take 1 capsule by mouth daily.    Historical Provider, MD  metoprolol tartrate (LOPRESSOR) 25 MG tablet Take 25 mg by mouth 2 (two) times daily.    Historical Provider, MD  montelukast (SINGULAIR) 10 MG tablet Take 10 mg by mouth at bedtime.    Historical Provider, MD  Multiple Vitamin (MULTIVITAMIN WITH MINERALS) TABS Take 1 tablet by mouth daily.    Historical Provider, MD  Multiple Vitamins-Minerals (PRESERVISION AREDS PO) Take 1 tablet by mouth daily.    Historical Provider, MD  potassium  chloride SA (K-DUR,KLOR-CON) 20 MEQ tablet Take 20 mEq by mouth 3 (three) times daily.    Historical Provider, MD  vitamin B-12 (CYANOCOBALAMIN) 1000 MCG tablet Take 3,000 mcg by mouth daily.    Historical Provider, MD  warfarin (COUMADIN) 2 MG tablet Take 1 tablet (2 mg total) by mouth daily. Patient taking differently: Take 2-4 mg by mouth See admin instructions. Alternating 2mg  and 4mg  every other day 12/20/14   Fritzi Mandes, MD    Inpatient Medications:    . azithromycin (ZITHROMAX) 500 MG IVPB    . cefTRIAXone (ROCEPHIN)  IV      Allergies:  Allergies  Allergen Reactions  . Amoxicillin Other (See Comments)    Lips swelling, tingling  . Benzocaine-Menthol     Throat swelling  . Biafine [Wound Dressings] Swelling  . Chloraseptic Sore Throat [Acetaminophen] Other (See  Comments)    throat swelling  . Fosamax [Alendronate Sodium]   . Neosporin [Neomycin-Bacitracin Zn-Polymyx] Other (See Comments)    Skin redness, puffy  . Other     "chlorotrimitron"-- rxn: throat swelling  . Oxycodone Other (See Comments)    Dizziness, lips tingling  . Statins Other (See Comments)    Muscle weakness, weak  . Tape Other (See Comments)    Skin redness    Social History   Social History  . Marital status: Single    Spouse name: N/A  . Number of children: N/A  . Years of education: N/A   Occupational History  . Not on file.   Social History Main Topics  . Smoking status: Never Smoker  . Smokeless tobacco: Never Used  . Alcohol use No  . Drug use: No  . Sexual activity: Not Currently   Other Topics Concern  . Not on file   Social History Narrative  . No narrative on file     Family History  Problem Relation Age of Onset  . Pulmonary embolism Mother   . Arthritis Mother   . Hypertension Mother   . Heart attack Mother   . Breast cancer Mother 86  . Stomach cancer Maternal Aunt   . Throat cancer Maternal Uncle   . Stomach cancer Maternal Grandfather      Review of  Systems Positive forShortness of breath chest pain Negative for: General:  chills, fever, night sweats or weight changes.  Cardiovascular: PND orthopnea syncope dizziness  Dermatological skin lesions rashes Respiratory: Cough congestion Urologic: Frequent urination urination at night and hematuria Abdominal: negative for nausea, vomiting, diarrhea, bright red blood per rectum, melena, or hematemesis Neurologic: negative for visual changes, and/or hearing changes  All other systems reviewed and are otherwise negative except as noted above.  Labs:  Recent Labs  03/24/16 1150  TROPONINI <0.03   Lab Results  Component Value Date   WBC 18.3 (H) 03/24/2016   HGB 15.5 03/24/2016   HCT 46.2 03/24/2016   MCV 94.5 03/24/2016   PLT 218 03/24/2016    Recent Labs Lab 03/24/16 1150  NA 139  K 4.4  CL 104  CO2 25  BUN 19  CREATININE 0.96  CALCIUM 9.9  PROT 7.5  BILITOT 0.8  ALKPHOS 71  ALT 28  AST 34  GLUCOSE 149*   No results found for: CHOL, HDL, LDLCALC, TRIG No results found for: DDIMER  Radiology/Studies:  Dg Chest Portable 1 View  Result Date: 03/24/2016 CLINICAL DATA:  Chest pain, worsening shortness of breath EXAM: PORTABLE CHEST 1 VIEW COMPARISON:  12/19/2014 FINDINGS: There is no focal parenchymal opacity. There is no pleural effusion or pneumothorax. The heart and mediastinal contours are stable. The osseous structures are unremarkable. IMPRESSION: No active disease. Electronically Signed   By: Kathreen Devoid   On: 03/24/2016 12:00    [id number] tachycardia with nonspecific ST and T-wave changes  Weights: Filed Weights   03/24/16 1138  Weight: 117.9 kg (260 lb)     Physical Exam: Blood pressure 108/62, pulse (!) 142, temperature (!) 100.5 F (38.1 C), temperature source Oral, resp. rate (!) 23, height 5' 5.5" (1.664 m), weight 117.9 kg (260 lb), SpO2 97 %. Body mass index is 42.61 kg/m. General: Well developed, well nourished, in no acute distress. Head  eyes ears nose throat: Normocephalic, atraumatic, sclera non-icteric, no xanthomas, nares are without discharge. No apparent thyromegaly and/or mass  Lungs: Normal respiratory effort.Few wheezes, no rales, no rhonchi.  Heart:Irregular with normal S1 S2. no murmur gallop, no rub, PMI is normal size and placement, carotid upstroke normal without bruit, jugular venous pressure is normal Abdomen: Soft, non-tender, non-distended with normoactive bowel sounds. No hepatomegaly. No rebound/guarding. No obvious abdominal masses. Abdominal aorta is normal size without bruit Extremities: Trace to 1+ edema. no cyanosis, no clubbing, no ulcers  Peripheral : 2+ bilateral upper extremity pulses, 2+ bilateral femoral pulses, 2+ bilateral dorsal pedal pulse Neuro: Alert and oriented. No facial asymmetry. No focal deficit. Moves all extremities spontaneously. Musculoskeletal: Normal muscle tone without kyphosis Psych:  Responds to questions appropriately with a normal affect.    Assessment: 75 year old female with essential hypertension mixed hyperlipidemia and paroxysmal nonvalvular atrial fibrillation with now some atypical chest discomfort and no current evidence of ST elevation myocardial infarction and/or acute coronary syndrome  Plan: 1. Intravenous beta blocker for heart rate control of tachycardia  2. Further investigation of chest pain including possible CAT scan and or other evaluation consistent with possible infection 3. Abstain from other antihypertensive due to concerns of hypotension 4. Hydration 5. Serial ECG and enzymes to assess for possible non-ST elevation myocardial infarction 6. Further diagnostic testing and treatment options after above  Signed, Corey Skains M.D. [facility] 03/24/2016, 12:41 PM

## 2016-03-24 NOTE — Progress Notes (Signed)
ANTICOAGULATION CONSULT NOTE - Initial Consult  Pharmacy Consult for Warfarin Indication: atrial fibrillation  Allergies  Allergen Reactions  . Amoxicillin Other (See Comments)    Lips swelling, tingling  . Benzocaine-Menthol     Throat swelling  . Biafine [Wound Dressings] Swelling  . Chloraseptic Sore Throat [Acetaminophen] Other (See Comments)    throat swelling  . Fosamax [Alendronate Sodium]   . Neosporin [Neomycin-Bacitracin Zn-Polymyx] Other (See Comments)    Skin redness, puffy  . Other     "chlorotrimitron"-- rxn: throat swelling  . Oxycodone Other (See Comments)    Dizziness, lips tingling  . Statins Other (See Comments)    Muscle weakness, weak  . Tape Other (See Comments)    Skin redness    Patient Measurements: Height: 5' 5.5" (166.4 cm) Weight: 260 lb (117.9 kg) IBW/kg (Calculated) : 58.15  Vital Signs: Temp: 100.5 F (38.1 C) (10/03 1137) Temp Source: Oral (10/03 1137) BP: 105/57 (10/03 1500) Pulse Rate: 94 (10/03 1500)  Labs:  Recent Labs  03/24/16 1150  HGB 15.5  HCT 46.2  PLT 218  LABPROT 26.2*  INR 2.36  CREATININE 0.96  TROPONINI <0.03    Estimated Creatinine Clearance: 66.6 mL/min (by C-G formula based on SCr of 0.96 mg/dL).   Medical History: Past Medical History:  Diagnosis Date  . Arthritis    wrist and knees  . Asthma   . Cancer ([place])    skin cancer  . Carpal tunnel syndrome   . Chicken pox   . Degenerative arthritis of knee, bilateral   . Diverticulitis   . Diverticulosis   . Dysrhythmia    afib  . GERD (gastroesophageal reflux disease)   . Hernia, umbilical   . Hyperlipidemia   . Hypertension   . Numbness and tingling    arm to leg  . Osteoporosis   . Pneumonia   . Shortness of breath    only with astma attacks  . Sleep apnea     Medications:   (Not in a hospital admission)  Assessment: 75 y/o F with a h/o AF admitted with CP. Patient on warfarin 4 mg daily PTA with therapeutic INR on admission.    Goal of Therapy:  INR 2-3   Plan:  Patient started on Levaquin which may increase INR while on warfarin. Will start warfarin dosing slightly lower than PTA dosing at 3 mg daily and f/u AM INR.   Ulice Dash D 03/24/2016,3:19 PM

## 2016-03-24 NOTE — ED Provider Notes (Addendum)
[facility] Emergency Department Provider Note   ____________________________________________   First MD Initiated Contact with Patient 03/24/16 1135     (approximate)  I have reviewed the triage vital signs and the nursing notes.   HISTORY  Chief Complaint Chest Pain   HPI Laura Gonzalez is a 75 y.o. female who reports an earache starting last night that went into her throat and then her chest and her back and her left arm. She reports chest heaviness and tightness. He feels a little bit short of breath. She has had some shortness of breath in the past because of asthma. She also reports she has pleuritic chest pain has an going on for quite some time. She took a half of a metoprolol at home and then had a sublingual nitroglycerin spray. In the emergency room her heart rate was 141 initial EKG showed ST elevation inferiorly in II, III, and F. Stressed the patient with Dr. Ellyn Hack and Martyn Malay. He reviewed the EKG is not sure that it is an MI. He found an old EKG from 2015 looks somewhat similar although it was A. fib. Dr. Ellyn Hack has another case going on at [facility] so we call Dr. Nehemiah Massed to come down and he came down and looked at the EKGs including the old ones and he is not sure there is no my going on either at this point especially since the patient's been having chest pain for approximately 12 hours really hasn't worse. Agents blood pressures dropped in the meantime we had to give her a fluid bolus this brought the pressure back up he is now planning on giving her a little bit of beta blocker to try and bring the heart rate down. Chest x-ray looked okay to me and the radiologist. Patient says she has an occasional cough does not bring anything up but feels like when she had pneumonia previously. Her white blood count is elevated she has a low-grade fever 100.5.   Past Medical History:  Diagnosis Date  . Arthritis    wrist and knees  . Asthma   . Cancer  ([place])    skin cancer  . Carpal tunnel syndrome   . Chicken pox   . Degenerative arthritis of knee, bilateral   . Diverticulitis   . Diverticulosis   . Dysrhythmia    afib  . GERD (gastroesophageal reflux disease)   . Hernia, umbilical   . Hyperlipidemia   . Hypertension   . Numbness and tingling    arm to leg  . Osteoporosis   . Pneumonia   . Shortness of breath    only with astma attacks  . Sleep apnea     Patient Active Problem List   Diagnosis Date Noted  . Diverticulitis 12/11/2014  . Sepsis ([place]) 12/11/2014  . Persistent atrial fibrillation ([place]) 12/11/2014  . HTN (hypertension) 12/11/2014  . GERD (gastroesophageal reflux disease) 12/11/2014  . Asthma 12/11/2014    Past Surgical History:  Procedure Laterality Date  . ANTERIOR CERVICAL DECOMP/DISCECTOMY FUSION N/A 08/09/2012   Procedure: ANTERIOR CERVICAL DECOMPRESSION/DISCECTOMY FUSION 2 LEVELS;  Surgeon: [name], MD;  Location: [facility] NEURO ORS;  Service: Neurosurgery;  Laterality: N/A;  C3-4 C4-5 Anterior cervical decompression/diskectomy/fusion/[employer] Bone/Trestle plate  . BREAST BIOPSY Left 1984   EXCISIONAL - NEG  . BREAST BIOPSY Left 1987   EXCISIONAL - NEG  . BREAST SURGERY    . COLONOSCOPY    . COLONOSCOPY WITH PROPOFOL N/A 07/29/2015   Procedure: COLONOSCOPY  WITH PROPOFOL;  Surgeon: [name], MD;  Location: [facility] ENDOSCOPY;  Service: Endoscopy;  Laterality: N/A;  . colonscopy  2000,2007,2012  . ESOPHAGOGASTRODUODENOSCOPY    . HERNIA REPAIR    . JOINT REPLACEMENT Right    Total Knee Replacement  . LIPOMA EXCISION Right 2010   back  . MASTECTOMY PARTIAL / LUMPECTOMY Left 1980s  . ROTATOR CUFF REPAIR Bilateral right- 2009, left 2011  . SKIN CANCER EXCISION  2009   back of neck and right cheek  . TONSILLECTOMY  1960  . TRACHEOSTOMY    . UMBILICAL HERNIA REPAIR  [id number]  . VARICOSE VEIN SURGERY Left 04/2009 rt 2011  . [place]  . WRIST SURGERY Right 1998   external  fixator    Prior to Admission medications   Medication Sig Start Date End Date Taking? Authorizing Provider  acetaminophen (TYLENOL) 325 MG tablet Take 650 mg by mouth every 6 (six) hours as needed for mild pain, moderate pain or headache.   Yes Historical Provider, MD  albuterol (PROVENTIL HFA;VENTOLIN HFA) 108 (90 BASE) MCG/ACT inhaler Inhale 2 puffs into the lungs every 6 (six) hours as needed. For shortness of breath   Yes Historical Provider, MD  Calcium Citrate (CITRACAL PO) Take 1 tablet by mouth 2 (two) times daily.   Yes Historical Provider, MD  Cholecalciferol (VITAMIN D3) 5000 UNITS CAPS Take 1 capsule by mouth daily.   Yes Historical Provider, MD  diltiazem (CARDIZEM CD) 300 MG 24 hr capsule Take 300 mg by mouth daily.   Yes Historical Provider, MD  diltiazem (CARDIZEM) 60 MG tablet Take 120 mg by mouth every 6 (six) hours as needed. Takes if has palpitation after taking diltiazem 300mg  dose   Yes Historical Provider, MD  ezetimibe (ZETIA) 10 MG tablet Take 10 mg by mouth at bedtime.   Yes Historical Provider, MD  fluticasone (FLONASE) 50 MCG/ACT nasal spray Place 1 spray into both nostrils 2 (two) times daily.   Yes Historical Provider, MD  hydrochlorothiazide (HYDRODIURIL) 25 MG tablet Take 25 mg by mouth daily.   Yes Historical Provider, MD  ipratropium-albuterol (DUONEB) 0.5-2.5 (3) MG/3ML SOLN Take 3 mLs by nebulization every 6 (six) hours as needed.   Yes Historical Provider, MD  magnesium oxide (MAG-OX) 400 MG tablet Take 400 mg by mouth daily.   Yes Historical Provider, MD  MEGARED OMEGA-3 KRILL OIL 500 MG CAPS Take 1 capsule by mouth daily.   Yes Historical Provider, MD  metoprolol tartrate (LOPRESSOR) 25 MG tablet Take 12.5 mg by mouth 2 (two) times daily.    Yes Historical Provider, MD  montelukast (SINGULAIR) 10 MG tablet Take 10 mg by mouth at bedtime.   Yes Historical Provider, MD  Multiple Vitamin (MULTIVITAMIN WITH MINERALS) TABS Take 1 tablet by mouth daily.   Yes  Historical Provider, MD  Multiple Vitamins-Minerals (PRESERVISION AREDS PO) Take 1 tablet by mouth daily.   Yes Historical Provider, MD  potassium chloride SA (K-DUR,KLOR-CON) 20 MEQ tablet Take 20 mEq by mouth 3 (three) times daily.   Yes Historical Provider, MD  vitamin B-12 (CYANOCOBALAMIN) 1000 MCG tablet Take 3,000 mcg by mouth daily.   Yes Historical Provider, MD  warfarin (COUMADIN) 2 MG tablet Take 1 tablet (2 mg total) by mouth daily. Patient taking differently: Take 4 mg by mouth daily.  12/20/14  Yes Fritzi Mandes, MD  bisacodyl (BISACODYL) 5 MG EC tablet Take 5 mg by mouth daily as needed for moderate constipation.  Historical Provider, MD  loratadine (CLARITIN) 10 MG tablet Take 10 mg by mouth daily.    Historical Provider, MD    Allergies Amoxicillin; Benzocaine-menthol; Biafine [wound dressings]; Chloraseptic sore throat [acetaminophen]; Fosamax [alendronate sodium]; Neosporin [neomycin-bacitracin zn-polymyx]; Other; Oxycodone; Statins; and Tape  Family History  Problem Relation Age of Onset  . Pulmonary embolism Mother   . Arthritis Mother   . Hypertension Mother   . Heart attack Mother   . Breast cancer Mother 5  . Stomach cancer Maternal Aunt   . Throat cancer Maternal Uncle   . Stomach cancer Maternal Grandfather     Social History Social History  Substance Use Topics  . Smoking status: Never Smoker  . Smokeless tobacco: Never Used  . Alcohol use No    Review of Systems Constitutional: Subjectively No fever/chills Eyes: No visual changes. ENT: No sore throat. Cardiovascular:  chest pain. Respiratory:shortness of breath. Gastrointestinal: No abdominal pain.  No nausea, no vomiting.  No diarrhea.  No constipation. Genitourinary: Negative for dysuria. Musculoskeletal: Negative for back pain. Skin: Negative for rash. Neurological: Negative for headaches, focal weakness or numbness.  10-point ROS otherwise  negative.  ____________________________________________   PHYSICAL EXAM:  VITAL SIGNS: ED Triage Vitals  Enc Vitals Group     BP 03/24/16 1135 (!) 109/49     Pulse Rate 03/24/16 1135 (!) 141     Resp 03/24/16 1137 (!) 24     Temp 03/24/16 1137 (!) 100.5 F (38.1 C)     Temp Source 03/24/16 1137 Oral     SpO2 03/24/16 1135 93 %     Weight 03/24/16 1138 260 lb (117.9 kg)     Height 03/24/16 1138 5' 5.5" (1.664 m)     Head Circumference --      Peak Flow --      Pain Score 03/24/16 1139 7     Pain Loc --      Pain Edu? --      Excl. in [place]? --     Constitutional: Alert and oriented. Well appearing and in no acute distress. Eyes: Conjunctivae are normal. PERRL. EOMI. Head: Atraumatic. Nose: No congestion/rhinnorhea. Mouth/Throat: Mucous membranes are moist.  Oropharynx non-erythematous. Neck: No stridor.  Cardiovascular: Normal rate, regular rhythm. Grossly normal heart sounds.  Good peripheral circulation. Respiratory: Normal respiratory effort.  No retractions. Lungs CTAB. Gastrointestinal: Soft and nontender. No distention. No abdominal bruits. No CVA tenderness. Musculoskeletal: No lower extremity tenderness nor edema.  No joint effusions. Neurologic:  Normal speech and language. No gross focal neurologic deficits are appreciated. No gait instability. Skin:  Skin is warm, dry and intact. No rash noted. Psychiatric: Mood and affect are normal. Speech and behavior are normal.  ____________________________________________   LABS (all labs ordered are listed, but only abnormal results are displayed)  Labs Reviewed  COMPREHENSIVE METABOLIC PANEL - Abnormal; Notable for the following:       Result Value   Glucose, Bld 149 (*)    GFR calc non Af Amer 57 (*)    All other components within normal limits  CBC WITH DIFFERENTIAL/PLATELET - Abnormal; Notable for the following:    WBC 18.3 (*)    Neutro Abs 16.2 (*)    Lymphs Abs 0.8 (*)    Monocytes Absolute 1.3 (*)    All  other components within normal limits  LACTIC ACID, PLASMA - Abnormal; Notable for the following:    Lactic Acid, Venous 2.8 (*)    All other components within normal limits  PROTIME-INR - Abnormal; Notable for the following:    Prothrombin Time 26.2 (*)    All other components within normal limits  CULTURE, BLOOD (ROUTINE X 2)  CULTURE, BLOOD (ROUTINE X 2)  URINE CULTURE  TROPONIN I  FIBRIN DERIVATIVES D-DIMER ([facility] ONLY)  CBC  LACTIC ACID, PLASMA  URINALYSIS COMPLETEWITH MICROSCOPIC ([facility] ONLY)   ____________________________________________  EKG  EKG read and interpreted by me shows sinus tach rate of 141 there is ST elevation of at least a millimeter in 23 and aVF. There really aren't any reciprocal changes is looks like it could be an acute MI AG #2 read and interpreted by me shows sinus tachycardia rate of 144 ST elevation in previous EKG appears to have resolved or improved drastically  ____________________________________________  RADIOLOGY  Chest x-ray read by both myself and the radiologist as no acute disease  ____________________________________________   PROCEDURES  Procedure(s) performed:   Procedures  Critical Care performed: Critical care time one and a half hours including talking to Dr. Nehemiah Massed and Dr. Ellyn Hack in the ambulance crew and working with the patient and drop in blood pressure  ____________________________________________   INITIAL IMPRESSION / ASSESSMENT AND PLAN / ED COURSE  Pertinent labs & imaging results that were available during my care of the patient were reviewed by me and considered in my medical decision making (see chart for details).    Clinical Course     ____________________________________________   FINAL CLINICAL IMPRESSION(S) / ED DIAGNOSES  Final diagnoses:  Chest pain, unspecified type  Sepsis, due to unspecified organism [facility])      NEW MEDICATIONS STARTED DURING THIS VISIT:  New Prescriptions   No  medications on file     Note:  This document was prepared using Dragon voice recognition software and may include unintentional dictation errors.    Nena Polio, MD 03/24/16 [place], MD 03/24/16 [phone]

## 2016-03-24 NOTE — Plan of Care (Signed)
Problem: Activity: Goal: Risk for activity intolerance will decrease Outcome: Progressing  Arrival Method: Bed accompanied by 1 Rns Mental Orientation: A&O x 4 Telemetry: Pt placed on monitor. Central tele and Elink aware of pt's transfer Assessment: Completed Skin: wnl  IV: 2 PIV flushes easily, no pain, no blood return Pain: mild chest  Pain residual  Environmental changes completed to facilitate rest and relaxation.  Safety Measures: Bed alarm obn, 2/4 bed rails up.  Unit Orientation: Pt and family oriented to room, has received patient guide, and taught how to use call bell system.  Family: Family  at bedside with pt.   BP 102/87   Pulse (!) 52   Temp 98.3 F (36.8 C) (Oral)   Resp 15   Ht 5' 5.5" (1.664 m)   Wt 117.9 kg (260 lb)   SpO2 95%   BMI 42.61 kg/m   on 2 l/min nasal canula   Intake/Output Summary (Last 24 hours) at 03/24/16 1813 Last data filed at 03/24/16 1800  Gross per 24 hour  Intake              820 ml  Output              400 ml  Net              420 ml    Lactic Acid, Venous    Component Value Date/Time   LATICACIDVEN 2.8 (HH) 03/24/2016 1205   CMP Latest Ref Rng & Units 03/24/2016 02/12/2015 12/20/2014  Glucose 65 - 99 mg/dL 149(H) - 123(H)  BUN 6 - 20 mg/dL 19 - 25(H)  Creatinine 0.44 - 1.00 mg/dL 0.96 - 0.63  Sodium 135 - 145 mmol/L 139 - 137  Potassium 3.5 - 5.1 mmol/L 4.4 4.2 3.7  Chloride 101 - 111 mmol/L 104 - 98(L)  CO2 22 - 32 mmol/L 25 - 33(H)  Calcium 8.9 - 10.3 mg/dL 9.9 - 9.1  Total Protein 6.5 - 8.1 g/dL 7.5 - -  Total Bilirubin 0.3 - 1.2 mg/dL 0.8 - -  Alkaline Phos 38 - 126 U/L 71 - -  AST 15 - 41 U/L 34 - -  ALT 14 - 54 U/L 28 - -      pcr mrsa pending  Foam applied to sacrum

## 2016-03-24 NOTE — ED Notes (Signed)
Cardiology at bedside.

## 2016-03-24 NOTE — H&P (Signed)
PULMONARY / CRITICAL CARE MEDICINE   Name: Laura Gonzalez MRN: [id number] DOB: 1940/09/12    ADMISSION DATE:  03/24/2016  HISTORY OF PRESENT ILLNESS:   72 F with hx of CAF and prior pneumonias. She developed B otalgia on the evening prior to admission with pain descending into her neck. She then around [place] last night with substernal sharp chest pain radiating through to her back and later developed mild dyspnea. This morning, she noted tachycardia and called EMS to come to ED for further eval where she reported this CP as being similar to that which she experienced with prior pneumonias. She also notes NP cough. When EMS initially arrived, she was hypertensive. In the ED, there was concern for an acute coronary syndrome and she was given metoprolol with subsequent hypotension. She has subsequently received 2 liters NS with persistent hypotension and has been started on  Norepinephrine at very low dose. She underwent CTA chest with results as below - no PE noted. She has been seen by Cardiology Nehemiah Massed). It is felt that her CP does not represent an acute coronary syndrome. At the time of this evaluation, she feels better but CP persists. Her serum lactate is mildly elevated  PAST MEDICAL HISTORY :  She  has a past medical history of Arthritis; Asthma; Cancer ([place]); Carpal tunnel syndrome; Chicken pox; Degenerative arthritis of knee, bilateral; Diverticulitis; Diverticulosis; Dysrhythmia; GERD (gastroesophageal reflux disease); Hernia, umbilical; Hyperlipidemia; Hypertension; Numbness and tingling; Osteoporosis; Pneumonia; Shortness of breath; and Sleep apnea.  PAST SURGICAL HISTORY: She  has a past surgical history that includes Tonsillectomy (1960); Umbilical hernia repair [id number]); Wisdom tooth extraction (1963); Wrist surgery (Right, 1998); colonscopy (2000,2007,2012); Rotator cuff repair (Bilateral, right- 2009, left 2011); Skin cancer excision (2009); Lipoma excision (Right, 2010); Varicose vein  surgery (Left, 04/2009 rt 2011); Anterior cervical decomp/discectomy fusion (N/A, 08/09/2012); Hernia repair; Breast surgery; Joint replacement (Right); Mastectomy partial / lumpectomy (Left, 1980s); Tracheostomy; Colonoscopy; Esophagogastroduodenoscopy; Colonoscopy with propofol (N/A, 07/29/2015); Breast biopsy (Left, 1984); and Breast biopsy (Left, 1987).  Allergies  Allergen Reactions  . Amoxicillin Other (See Comments)    Lips swelling, tingling  . Benzocaine-Menthol     Throat swelling  . Biafine [Wound Dressings] Swelling  . Chloraseptic Sore Throat [Acetaminophen] Other (See Comments)    throat swelling  . Fosamax [Alendronate Sodium]   . Neosporin [Neomycin-Bacitracin Zn-Polymyx] Other (See Comments)    Skin redness, puffy  . Other     "chlorotrimitron"-- rxn: throat swelling  . Oxycodone Other (See Comments)    Dizziness, lips tingling  . Statins Other (See Comments)    Muscle weakness, weak  . Tape Other (See Comments)    Skin redness    No current facility-administered medications on file prior to encounter.    Current Outpatient Prescriptions on File Prior to Encounter  Medication Sig  . acetaminophen (TYLENOL) 325 MG tablet Take 650 mg by mouth every 6 (six) hours as needed for mild pain, moderate pain or headache.  . albuterol (PROVENTIL HFA;VENTOLIN HFA) 108 (90 BASE) MCG/ACT inhaler Inhale 2 puffs into the lungs every 6 (six) hours as needed. For shortness of breath  . Calcium Citrate (CITRACAL PO) Take 1 tablet by mouth 2 (two) times daily.  . Cholecalciferol (VITAMIN D3) 5000 UNITS CAPS Take 1 capsule by mouth daily.  Marland Kitchen diltiazem (CARDIZEM CD) 300 MG 24 hr capsule Take 300 mg by mouth daily.  Marland Kitchen diltiazem (CARDIZEM) 60 MG tablet Take 120 mg by mouth every 6 (six) hours as needed.  Takes if has palpitation after taking diltiazem 300mg  dose  . ezetimibe (ZETIA) 10 MG tablet Take 10 mg by mouth at bedtime.  . fluticasone (FLONASE) 50 MCG/ACT nasal spray Place 1 spray into  both nostrils 2 (two) times daily.  . hydrochlorothiazide (HYDRODIURIL) 25 MG tablet Take 25 mg by mouth daily.  Marland Kitchen ipratropium-albuterol (DUONEB) 0.5-2.5 (3) MG/3ML SOLN Take 3 mLs by nebulization every 6 (six) hours as needed.  . magnesium oxide (MAG-OX) 400 MG tablet Take 400 mg by mouth daily.  Marland Kitchen MEGARED OMEGA-3 KRILL OIL 500 MG CAPS Take 1 capsule by mouth daily.  . metoprolol tartrate (LOPRESSOR) 25 MG tablet Take 12.5 mg by mouth 2 (two) times daily.   . montelukast (SINGULAIR) 10 MG tablet Take 10 mg by mouth at bedtime.  . Multiple Vitamin (MULTIVITAMIN WITH MINERALS) TABS Take 1 tablet by mouth daily.  . Multiple Vitamins-Minerals (PRESERVISION AREDS PO) Take 1 tablet by mouth daily.  . potassium chloride SA (K-DUR,KLOR-CON) 20 MEQ tablet Take 20 mEq by mouth 3 (three) times daily.  . vitamin B-12 (CYANOCOBALAMIN) 1000 MCG tablet Take 3,000 mcg by mouth daily.  Marland Kitchen warfarin (COUMADIN) 2 MG tablet Take 1 tablet (2 mg total) by mouth daily. (Patient taking differently: Take 4 mg by mouth daily. )  . bisacodyl (BISACODYL) 5 MG EC tablet Take 5 mg by mouth daily as needed for moderate constipation.  Marland Kitchen loratadine (CLARITIN) 10 MG tablet Take 10 mg by mouth daily.    FAMILY HISTORY:  She indicated that her mother is deceased. She indicated that her father is deceased. She indicated that the status of her maternal grandfather is unknown. She indicated that the status of her maternal aunt is unknown. She indicated that the status of her maternal uncle is unknown.    SOCIAL HISTORY: She  reports that she has never smoked. She has never used smokeless tobacco. She reports that she does not drink alcohol or use drugs.  REVIEW OF SYSTEMS:   As per HPI. Otherwise, a detailed ROS is noncontributory  SUBJECTIVE:    VITAL SIGNS: BP (!) 105/57   Pulse 94   Temp (!) 100.5 F (38.1 C) (Oral)   Resp 19   Ht 5' 5.5" (1.664 m)   Wt 260 lb (117.9 kg)   SpO2 99%   BMI 42.61 kg/m    HEMODYNAMICS:    VENTILATOR SETTINGS:    INTAKE / OUTPUT: No intake/output data recorded.  PHYSICAL EXAMINATION: General: Obese, RASS 0, NAD Neuro: CNs intact, motor intact, DTRs symmetric HEENT: NCAT, mucus membranes moist Cardiovascular: IRIR, rate controlled, no M noted Lungs: distant BS, few basilar crackles on R posteriorly, no wheezes Abdomen: Obese, soft, NT, +BS Ext: warm, no edema Skin: no lesions noted  LABS:  BMET  Recent Labs Lab 03/24/16 1150  NA 139  K 4.4  CL 104  CO2 25  BUN 19  CREATININE 0.96  GLUCOSE 149*    Electrolytes  Recent Labs Lab 03/24/16 1150  CALCIUM 9.9    CBC  Recent Labs Lab 03/24/16 1150  WBC 18.3*  HGB 15.5  HCT 46.2  PLT 218    Coag's  Recent Labs Lab 03/24/16 1150  INR 2.36    Sepsis Markers  Recent Labs Lab 03/24/16 1205  LATICACIDVEN 2.8*    ABG No results for input(s): PHART, PCO2ART, PO2ART in the last 168 hours.  Liver Enzymes  Recent Labs Lab 03/24/16 1150  AST 34  ALT 28  ALKPHOS 71  BILITOT 0.8  ALBUMIN 3.8    Cardiac Enzymes  Recent Labs Lab 03/24/16 1150  TROPONINI <0.03    Glucose No results for input(s): GLUCAP in the last 168 hours.  Imaging Ct Angio Chest Pe W And/or Wo Contrast  Result Date: 03/24/2016 CLINICAL DATA:  Pleuritic chest pain, tachycardia, shortness of breath EXAM: CT ANGIOGRAPHY CHEST WITH CONTRAST TECHNIQUE: Multidetector CT imaging of the chest was performed using the standard protocol during bolus administration of intravenous contrast. Multiplanar CT image reconstructions and MIPs were obtained to evaluate the vascular anatomy. CONTRAST:  100 cc Isovue 370 COMPARISON:  Portable chest x-ray of 03/24/2016 and CT chest of 12/16/2014 FINDINGS: Cardiovascular: The pulmonary arteries are well opacified. There is no evidence of acute pulmonary embolism. The thoracic aorta is not as well opacified but no acute abnormality is seen. The heart is mildly  enlarged. No pericardial effusion is noted. Mediastinum/Nodes: No mediastinal or hilar adenopathy is seen. The thyroid gland is unremarkable. No abnormality of the thoracic esophagus is seen. Lungs/Pleura: Bibasilar linear atelectasis is noted. No parenchymal infiltrate is seen and there is no evidence of pleural effusion. The central airway is patent. Upper Abdomen: The liver appears somewhat prominent and low in attenuation possibly indicating fatty infiltration. A calcified hepatic granuloma is noted within the left lobe. Musculoskeletal: The thoracic vertebrae are in normal alignment with diffuse degenerative change present. No compression deformity is seen. Review of the MIP images confirms the above findings. IMPRESSION: 1. Negative CT angiogram of the chest. No evidence of acute pulmonary embolism. No active process is seen. 2. Question mild fatty infiltration of the liver pre Electronically Signed   By: Ivar Drape M.D.   On: 03/24/2016 13:44   Dg Chest Portable 1 View  Result Date: 03/24/2016 CLINICAL DATA:  Chest pain, worsening shortness of breath EXAM: PORTABLE CHEST 1 VIEW COMPARISON:  12/19/2014 FINDINGS: There is no focal parenchymal opacity. There is no pleural effusion or pneumothorax. The heart and mediastinal contours are stable. The osseous structures are unremarkable. IMPRESSION: No active disease. Electronically Signed   By: Kathreen Devoid   On: 03/24/2016 12:00     ASSESSMENT / PLAN: CARDIOVASCULAR A:  - Hypotension - unclear etiology. Onset appears to be associated with administration of metoprolol. Possible component of SIRS. Received 2 liters NS in ED - Chronic AF - rate now controlled - Atypical chest pain without ischemic changes on EKG P:  Phenylephrine to maintain MAP > 60 mmHg and SBP > 90 mmHg Low dose PRN metoprolol to maintain HR < 115/min Cycle cardiac markers Echocardiogram ordered to assess LV function  Empiric antacid therapy - assess response of CP to  this  PULMONARY A: Prior history of PNA Dyspnea Vague infiltrates on CT chest - possible History of mild intermittent asthma P:   Supplemental oxygen PRN albuterol nebs  INFECTIOUS A:   SIRS - suspected sepsis Possible PNA R/O noninfectious causes of SIRS P:   Monitor temp, WBC count Micro and abx as above PCT algortihm Check amylase/lipase  RENAL A:   Mild AKI P:   Monitor BMET intermittently Monitor I/Os Correct electrolytes as indicated  GASTROINTESTINAL A:   No issues identified P:   SUP: IV famotidine Carb/heart diet  HEMATOLOGIC A:   No issues P:  DVT px: warfarin (chronic) Monitor CBC intermittently Transfuse per usual guidelines   ENDOCRINE A:   Mild hyperglycemia without prior history of DM P:   Monitor glu on chem panels Consider SSI for glu > 180  NEUROLOGIC A:  Pain P:   RASS goal: 0 PRN acetaminophen PRN tramadol   Merton Border, MD PCCM service Mobile [phone] Pager [phone] 03/24/2016, 3:13 PM

## 2016-03-25 ENCOUNTER — Inpatient Hospital Stay: Payer: Medicare Other

## 2016-03-25 ENCOUNTER — Inpatient Hospital Stay
Admit: 2016-03-25 | Discharge: 2016-03-25 | Disposition: A | Discharge status: Home / Self Care | Payer: Medicare Other | Attending: Pulmonary Disease | Admitting: Pulmonary Disease

## 2016-03-25 DIAGNOSIS — I952 Hypotension due to drugs: Secondary | ICD-10-CM

## 2016-03-25 LAB — BASIC METABOLIC PANEL
Anion gap: 7 (ref 5–15)
BUN: 14 mg/dL (ref 6–20)
CALCIUM: 8.6 mg/dL — AB (ref 8.9–10.3)
CHLORIDE: 105 mmol/L (ref 101–111)
CO2: 26 mmol/L (ref 22–32)
Creatinine, Ser: 0.83 mg/dL (ref 0.44–1.00)
GLUCOSE: 128 mg/dL — AB (ref 65–99)
POTASSIUM: 3.7 mmol/L (ref 3.5–5.1)
Sodium: 138 mmol/L (ref 135–145)

## 2016-03-25 LAB — CBC
HCT: 41.8 % (ref 35.0–47.0)
HEMOGLOBIN: 14.2 g/dL (ref 12.0–16.0)
MCH: 31.9 pg (ref 26.0–34.0)
MCHC: 34 g/dL (ref 32.0–36.0)
MCV: 93.9 fL (ref 80.0–100.0)
Platelets: 185 10*3/uL (ref 150–440)
RBC: 4.45 MIL/uL (ref 3.80–5.20)
RDW: 13.7 % (ref 11.5–14.5)
WBC: 15.6 10*3/uL — ABNORMAL HIGH (ref 3.6–11.0)

## 2016-03-25 LAB — ECHOCARDIOGRAM COMPLETE
HEIGHTINCHES: 65.5 in
WEIGHTICAEL: 4160 [oz_av]

## 2016-03-25 LAB — PROTIME-INR
INR: 2.55
PROTHROMBIN TIME: 27.9 s — AB (ref 11.4–15.2)

## 2016-03-25 LAB — PROCALCITONIN: PROCALCITONIN: 0.13 ng/mL

## 2016-03-25 LAB — CORTISOL: Cortisol, Plasma: 27.3 ug/dL

## 2016-03-25 MED ORDER — OMEGA-3-ACID ETHYL ESTERS 1 G PO CAPS
1.0000 g | ORAL_CAPSULE | Freq: Two times a day (BID) | ORAL | Status: DC
  Administered 2016-03-25 – 2016-04-01 (×14): 1 g via ORAL
  Filled 2016-03-25 (×14): qty 1

## 2016-03-25 MED ORDER — AMIODARONE IV BOLUS ONLY 150 MG/100ML
150.0000 mg | Freq: Once | INTRAVENOUS | Status: DC
  Filled 2016-03-25: qty 100

## 2016-03-25 MED ORDER — DILTIAZEM HCL ER COATED BEADS 120 MG PO CP24: 300.0000 mg | ORAL_CAPSULE | Freq: Every day | ORAL | Status: DC

## 2016-03-25 MED ORDER — METOPROLOL TARTRATE 25 MG PO TABS
12.5000 mg | ORAL_TABLET | Freq: Two times a day (BID) | ORAL | Status: DC
  Administered 2016-03-25 – 2016-04-01 (×14): 12.5 mg via ORAL
  Filled 2016-03-25 (×14): qty 1

## 2016-03-25 MED ORDER — MAGNESIUM OXIDE 400 (241.3 MG) MG PO TABS
400.0000 mg | ORAL_TABLET | Freq: Every day | ORAL | Status: DC
  Administered 2016-03-26 – 2016-04-01 (×7): 400 mg via ORAL
  Filled 2016-03-25 (×7): qty 1

## 2016-03-25 MED ORDER — POTASSIUM CHLORIDE CRYS ER 20 MEQ PO TBCR
20.0000 meq | EXTENDED_RELEASE_TABLET | Freq: Three times a day (TID) | ORAL | Status: DC
  Administered 2016-03-25 – 2016-03-27 (×8): 20 meq via ORAL
  Filled 2016-03-25 (×8): qty 1

## 2016-03-25 MED ORDER — LORATADINE 10 MG PO TABS
10.0000 mg | ORAL_TABLET | Freq: Every day | ORAL | Status: DC
  Administered 2016-03-26 – 2016-04-01 (×7): 10 mg via ORAL
  Filled 2016-03-25 (×7): qty 1

## 2016-03-25 MED ORDER — FLUTICASONE PROPIONATE 50 MCG/ACT NA SUSP
1.0000 | Freq: Every day | NASAL | Status: DC
  Administered 2016-03-26 – 2016-04-01 (×6): 1 via NASAL
  Filled 2016-03-25: qty 16

## 2016-03-25 MED ORDER — DILTIAZEM HCL 25 MG/5ML IV SOLN
10.0000 mg | Freq: Once | INTRAVENOUS | Status: AC
  Administered 2016-03-25: 10 mg via INTRAVENOUS
  Filled 2016-03-25: qty 5

## 2016-03-25 MED ORDER — VITAMIN B-12 1000 MCG PO TABS
1000.0000 ug | ORAL_TABLET | Freq: Every day | ORAL | Status: DC
  Administered 2016-03-26 – 2016-04-01 (×7): 1000 ug via ORAL
  Filled 2016-03-25 (×7): qty 1

## 2016-03-25 MED ORDER — DILTIAZEM HCL ER COATED BEADS 180 MG PO CP24
300.0000 mg | ORAL_CAPSULE | Freq: Every day | ORAL | Status: DC
  Administered 2016-03-25 – 2016-04-01 (×8): 300 mg via ORAL
  Filled 2016-03-25 (×8): qty 1

## 2016-03-25 MED ORDER — HYDROCHLOROTHIAZIDE 25 MG PO TABS
25.0000 mg | ORAL_TABLET | Freq: Every day | ORAL | Status: DC
  Administered 2016-03-26 – 2016-03-27 (×2): 25 mg via ORAL
  Filled 2016-03-25 (×2): qty 1

## 2016-03-25 MED ORDER — CALCIUM CITRATE-VITAMIN D 500-400 MG-UNIT PO CHEW
1.0000 | CHEWABLE_TABLET | Freq: Two times a day (BID) | ORAL | Status: DC
  Administered 2016-03-25 – 2016-04-01 (×14): 1 via ORAL
  Filled 2016-03-25 (×14): qty 1

## 2016-03-25 MED ORDER — EZETIMIBE 10 MG PO TABS
10.0000 mg | ORAL_TABLET | Freq: Every day | ORAL | Status: DC
  Administered 2016-03-26 – 2016-04-01 (×7): 10 mg via ORAL
  Filled 2016-03-25 (×7): qty 1

## 2016-03-25 MED ORDER — DILTIAZEM HCL 100 MG IV SOLR
5.0000 mg/h | INTRAVENOUS | Status: DC
  Administered 2016-03-25: 5 mg/h via INTRAVENOUS
  Administered 2016-03-26 (×2): 15 mg/h via INTRAVENOUS
  Administered 2016-03-27 (×2): 5 mg/h via INTRAVENOUS
  Filled 2016-03-25 (×5): qty 100

## 2016-03-25 MED ORDER — ALBUTEROL SULFATE (2.5 MG/3ML) 0.083% IN NEBU
2.5000 mg | INHALATION_SOLUTION | RESPIRATORY_TRACT | Status: DC
  Filled 2016-03-25: qty 3

## 2016-03-25 MED ORDER — ALPRAZOLAM 0.5 MG PO TABS
0.5000 mg | ORAL_TABLET | Freq: Once | ORAL | Status: AC
  Administered 2016-03-25: 0.5 mg via ORAL
  Filled 2016-03-25: qty 1

## 2016-03-25 MED ORDER — POTASSIUM CHLORIDE CRYS ER 20 MEQ PO TBCR
20.0000 meq | EXTENDED_RELEASE_TABLET | Freq: Once | ORAL | Status: AC
  Administered 2016-03-25: 20 meq via ORAL
  Filled 2016-03-25: qty 1

## 2016-03-25 MED ORDER — FUROSEMIDE 10 MG/ML IJ SOLN
20.0000 mg | Freq: Once | INTRAMUSCULAR | Status: AC
  Administered 2016-03-25: 20 mg via INTRAVENOUS
  Filled 2016-03-25: qty 2

## 2016-03-25 MED ORDER — LEVALBUTEROL HCL 1.25 MG/0.5ML IN NEBU
1.2500 mg | INHALATION_SOLUTION | RESPIRATORY_TRACT | Status: DC
  Administered 2016-03-25 – 2016-03-27 (×9): 1.25 mg via RESPIRATORY_TRACT
  Filled 2016-03-25 (×9): qty 0.5

## 2016-03-25 MED ORDER — LEVALBUTEROL HCL 1.25 MG/0.5ML IN NEBU
INHALATION_SOLUTION | RESPIRATORY_TRACT | Status: AC
  Administered 2016-03-25: 1.25 mg
  Filled 2016-03-25: qty 0.5

## 2016-03-25 NOTE — Progress Notes (Signed)
[facility] Cardiology [facility] Encounter Note  Patient: Laura Gonzalez / Admit Date: 03/24/2016 / Date of Encounter: 03/25/2016, 8:50 AM   Subjective: Patient has improved chest pain with less concern. Shortness of breath also improved. Telemetry shows sinus tachycardia but no EKG changes. No evidence of myocardial infarction with normal troponin  Review of Systems: Positive for: Shortness of breath weakness Negative for: Vision change, hearing change, syncope, dizziness, nausea, vomiting,diarrhea, bloody stool, stomach pain, cough, congestion, diaphoresis, urinary frequency, urinary pain,skin lesions, skin rashes Others previously listed  Objective: Telemetry: Sinus tachycardia Physical Exam: Blood pressure 135/72, pulse 99, temperature 98.3 F (36.8 C), temperature source Oral, resp. rate (!) 28, height 5' 5.5" (1.664 m), weight 117.9 kg (260 lb), SpO2 95 %. Body mass index is 42.61 kg/m. General: Well developed, well nourished, in no acute distress. Head: Normocephalic, atraumatic, sclera non-icteric, no xanthomas, nares are without discharge. Neck: No apparent masses Lungs: Normal respirations with few wheezes, some rhonchi, no rales , no crackles   Heart: Regular rate and rhythm, normal S1 S2, no murmur, no rub, no gallop, PMI is normal size and placement, carotid upstroke normal without bruit, jugular venous pressure normal Abdomen: Soft, non-tender,  distended with normoactive bowel sounds. No hepatosplenomegaly. Abdominal aorta is normal size without bruit Extremities trace edema, no clubbing, no cyanosis, no ulcers,  Peripheral: 2+ radial, 2+ femoral, 2+ dorsal pedal pulses Neuro: Alert and oriented. Moves all extremities spontaneously. Psych:  Responds to questions appropriately with a normal affect.   Intake/Output Summary (Last 24 hours) at 03/25/16 0850 Last data filed at 03/25/16 0729  Gross per 24 hour  Intake              880 ml  Output             2275 ml  Net             -1395 ml    Inpatient Medications:  . aspirin EC  325 mg Oral Daily  . famotidine (PEPCID) IV  20 mg Intravenous Q12H  . ipratropium-albuterol  3 mL Nebulization Q6H  . levofloxacin (LEVAQUIN) IV  750 mg Intravenous Q24H  . montelukast  10 mg Oral QHS  . warfarin  3 mg Oral q1800  . Warfarin - Pharmacist Dosing Inpatient   Does not apply q1800   Infusions:  . phenylephrine (NEO-SYNEPHRINE) Adult infusion      Labs:  Recent Labs  03/24/16 1150 03/25/16 0330  NA 139 138  K 4.4 3.7  CL 104 105  CO2 25 26  GLUCOSE 149* 128*  BUN 19 14  CREATININE 0.96 0.83  CALCIUM 9.9 8.6*    Recent Labs  03/24/16 1150  AST 34  ALT 28  ALKPHOS 71  BILITOT 0.8  PROT 7.5  ALBUMIN 3.8    Recent Labs  03/24/16 1150 03/25/16 0330  WBC 18.3* 15.6*  NEUTROABS 16.2*  --   HGB 15.5 14.2  HCT 46.2 41.8  MCV 94.5 93.9  PLT 218 185    Recent Labs  03/24/16 1150  TROPONINI <0.03   Invalid input(s): POCBNP No results for input(s): HGBA1C in the last 72 hours.   Weights: Filed Weights   03/24/16 1138  Weight: 117.9 kg (260 lb)     Radiology/Studies:  Ct Angio Chest Pe W And/or Wo Contrast  Result Date: 03/24/2016 CLINICAL DATA:  Pleuritic chest pain, tachycardia, shortness of breath EXAM: CT ANGIOGRAPHY CHEST WITH CONTRAST TECHNIQUE: Multidetector CT imaging of the chest was performed using  the standard protocol during bolus administration of intravenous contrast. Multiplanar CT image reconstructions and MIPs were obtained to evaluate the vascular anatomy. CONTRAST:  100 cc Isovue 370 COMPARISON:  Portable chest x-ray of 03/24/2016 and CT chest of 12/16/2014 FINDINGS: Cardiovascular: The pulmonary arteries are well opacified. There is no evidence of acute pulmonary embolism. The thoracic aorta is not as well opacified but no acute abnormality is seen. The heart is mildly enlarged. No pericardial effusion is noted. Mediastinum/Nodes: No mediastinal or hilar adenopathy is  seen. The thyroid gland is unremarkable. No abnormality of the thoracic esophagus is seen. Lungs/Pleura: Bibasilar linear atelectasis is noted. No parenchymal infiltrate is seen and there is no evidence of pleural effusion. The central airway is patent. Upper Abdomen: The liver appears somewhat prominent and low in attenuation possibly indicating fatty infiltration. A calcified hepatic granuloma is noted within the left lobe. Musculoskeletal: The thoracic vertebrae are in normal alignment with diffuse degenerative change present. No compression deformity is seen. Review of the MIP images confirms the above findings. IMPRESSION: 1. Negative CT angiogram of the chest. No evidence of acute pulmonary embolism. No active process is seen. 2. Question mild fatty infiltration of the liver pre Electronically Signed   By: Ivar Drape M.D.   On: 03/24/2016 13:44   Dg Chest Port 1 View  Result Date: 03/25/2016 CLINICAL DATA:  Worsening chest pain and shortness of breath. EXAM: PORTABLE CHEST 1 VIEW COMPARISON:  03/24/2016 FINDINGS: Shallow inspiration with atelectasis in the lung bases. Probable small pleural effusions. Cardiac enlargement. No pulmonary vascular congestion or edema. No pneumothorax. Postoperative changes in the cervical spine and right shoulder. IMPRESSION: Cardiac enlargement. Probable small pleural effusions with atelectasis in the lung bases. Electronically Signed   By: Lucienne Capers M.D.   On: 03/25/2016 05:39   Dg Chest Portable 1 View  Result Date: 03/24/2016 CLINICAL DATA:  Chest pain, worsening shortness of breath EXAM: PORTABLE CHEST 1 VIEW COMPARISON:  12/19/2014 FINDINGS: There is no focal parenchymal opacity. There is no pleural effusion or pneumothorax. The heart and mediastinal contours are stable. The osseous structures are unremarkable. IMPRESSION: No active disease. Electronically Signed   By: Kathreen Devoid   On: 03/24/2016 12:00     Assessment and Recommendation  75 y.o. female  with the known cardiovascular disease with paroxysmal nonvalvular atrial fibrillation currently remaining in normal sinus rhythm with some tachycardia on metoprolol without evidence of myocardial infarction although chest pain is relieved with concerns of possible infection or inflammation 1. Continue high medication management including metoprolol for heart rate control and maintenance of normal rhythm 2. No change in warfarin for further risk reduction in stroke with atrial fibrillation 3. Echocardiogram for LV systolic dysfunction valvular heart disease 4. Further treatment of and/or a supportive care of illness and/or inflammatory infection causing symptoms listed above 5. Ambulate and further adjustments of medications but no other further cardiac interventions at this time  Signed, Serafina Royals M.D. FACC

## 2016-03-25 NOTE — Progress Notes (Signed)
*  PRELIMINARY RESULTS* Echocardiogram 2D Echocardiogram has been performed.  Sherrie Sport 03/25/2016, 9:59 AM

## 2016-03-25 NOTE — Progress Notes (Signed)
PT complaining of being anxious and she thinks if she can get calmed down, her heart rate will come down. MD paged. Dr. Jannifer Franklin to put in orders for PO xanax. Will continue to monitor. Conley Simmonds, RN

## 2016-03-25 NOTE — Progress Notes (Signed)
ANTICOAGULATION CONSULT NOTE - Initial Consult  Pharmacy Consult for Warfarin Indication: atrial fibrillation  Allergies  Allergen Reactions  . Amoxicillin Other (See Comments)    Lips swelling, tingling  . Benzocaine-Menthol     Throat swelling  . Biafine [Wound Dressings] Swelling  . Chloraseptic Sore Throat [Acetaminophen] Other (See Comments)    throat swelling  . Fosamax [Alendronate Sodium]   . Neosporin [Neomycin-Bacitracin Zn-Polymyx] Other (See Comments)    Skin redness, puffy  . Other     "chlorotrimitron"-- rxn: throat swelling  . Oxycodone Other (See Comments)    Dizziness, lips tingling  . Statins Other (See Comments)    Muscle weakness, weak  . Tape Other (See Comments)    Skin redness    Patient Measurements: Height: 5' 5.5" (166.4 cm) Weight: 260 lb (117.9 kg) IBW/kg (Calculated) : 58.15  Vital Signs: Temp: 98.9 F (37.2 C) (10/04 1040) Temp Source: Oral (10/04 1040) BP: 130/68 (10/04 1040) Pulse Rate: 97 (10/04 1040)  Labs:  Recent Labs  03/24/16 1150 03/25/16 0330  HGB 15.5 14.2  HCT 46.2 41.8  PLT 218 185  LABPROT 26.2* 27.9*  INR 2.36 2.55  CREATININE 0.96 0.83  TROPONINI <0.03  --     Estimated Creatinine Clearance: 77.1 mL/min (by C-G formula based on SCr of 0.83 mg/dL).   Medical History: Past Medical History:  Diagnosis Date  . Arthritis    wrist and knees  . Asthma   . Cancer ([place])    skin cancer  . Carpal tunnel syndrome   . Chicken pox   . Degenerative arthritis of knee, bilateral   . Diverticulitis   . Diverticulosis   . Dysrhythmia    afib  . GERD (gastroesophageal reflux disease)   . Hernia, umbilical   . Hyperlipidemia   . Hypertension   . Numbness and tingling    arm to leg  . Osteoporosis   . Pneumonia   . Shortness of breath    only with astma attacks  . Sleep apnea     Medications:  Prescriptions Prior to Admission  Medication Sig Dispense Refill Last Dose  . acetaminophen (TYLENOL) 325 MG  tablet Take 650 mg by mouth every 6 (six) hours as needed for mild pain, moderate pain or headache.   prn at prn  . albuterol (PROVENTIL HFA;VENTOLIN HFA) 108 (90 BASE) MCG/ACT inhaler Inhale 2 puffs into the lungs every 6 (six) hours as needed. For shortness of breath   03/24/2016 at am  . Calcium Citrate (CITRACAL PO) Take 1 tablet by mouth 2 (two) times daily.   03/24/2016 at am  . Cholecalciferol (VITAMIN D3) 5000 UNITS CAPS Take 1 capsule by mouth daily.   03/24/2016 at am  . diltiazem (CARDIZEM CD) 300 MG 24 hr capsule Take 300 mg by mouth daily.   03/24/2016 at 0900  . diltiazem (CARDIZEM) 60 MG tablet Take 120 mg by mouth every 6 (six) hours as needed. Takes if has palpitation after taking diltiazem 300mg  dose   prn at prn  . ezetimibe (ZETIA) 10 MG tablet Take 10 mg by mouth at bedtime.   03/23/2016 at pm  . fluticasone (FLONASE) 50 MCG/ACT nasal spray Place 1 spray into both nostrils 2 (two) times daily.   03/24/2016 at Unknown time  . hydrochlorothiazide (HYDRODIURIL) 25 MG tablet Take 25 mg by mouth daily.   03/24/2016 at Unknown time  . ipratropium-albuterol (DUONEB) 0.5-2.5 (3) MG/3ML SOLN Take 3 mLs by nebulization every 6 (six) hours as needed.  03/24/2016 at am  . magnesium oxide (MAG-OX) 400 MG tablet Take 400 mg by mouth daily.   03/24/2016 at am  . MEGARED OMEGA-3 KRILL OIL 500 MG CAPS Take 1 capsule by mouth daily.   03/24/2016 at Unknown time  . metoprolol tartrate (LOPRESSOR) 25 MG tablet Take 12.5 mg by mouth 2 (two) times daily.    03/24/2016 at 0900  . montelukast (SINGULAIR) 10 MG tablet Take 10 mg by mouth at bedtime.   03/23/2016 at pm  . Multiple Vitamin (MULTIVITAMIN WITH MINERALS) TABS Take 1 tablet by mouth daily.   03/24/2016 at am  . Multiple Vitamins-Minerals (PRESERVISION AREDS PO) Take 1 tablet by mouth daily.   03/24/2016 at Unknown time  . potassium chloride SA (K-DUR,KLOR-CON) 20 MEQ tablet Take 20 mEq by mouth 3 (three) times daily.   03/24/2016 at 0900  . vitamin B-12  (CYANOCOBALAMIN) 1000 MCG tablet Take 3,000 mcg by mouth daily.   03/24/2016 at 0900  . warfarin (COUMADIN) 2 MG tablet Take 1 tablet (2 mg total) by mouth daily. (Patient taking differently: Take 4 mg by mouth daily. ) 30 tablet 0 03/23/2016 at 1700  . bisacodyl (BISACODYL) 5 MG EC tablet Take 5 mg by mouth daily as needed for moderate constipation.   Not Taking at Unknown time  . loratadine (CLARITIN) 10 MG tablet Take 10 mg by mouth daily.   Not Taking at Unknown time    Assessment: 75 y/o F with a h/o AF admitted with CP. Patient on warfarin 4 mg daily PTA with therapeutic INR on admission.   Goal of Therapy:  INR 2-3   Plan:  Patient started on Levaquin which may increase INR while on warfarin. Will continue warfarin dosing slightly lower than PTA dosing at 3 mg daily and f/u AM INR.   Ulice Dash D 03/25/2016,12:02 PM

## 2016-03-25 NOTE — H&P (Signed)
PULMONARY / CRITICAL CARE MEDICINE   Name: Laura Gonzalez MRN: [id number] DOB: 15-Mar-1941    ADMISSION DATE:  03/24/2016  HISTORY OF PRESENT ILLNESS:   54 F with hx of CAF and prior pneumonias. She developed B otalgia on the evening prior to admission with pain descending into her neck. She then around [place] last night with substernal sharp chest pain radiating through to her back and later developed mild dyspnea. This morning, she noted tachycardia and called EMS to come to ED for further eval where she reported this CP as being similar to that which she experienced with prior pneumonias. She also notes NP cough. When EMS initially arrived, she was hypertensive. In the ED, there was concern for an acute coronary syndrome and she was given metoprolol with subsequent hypotension. She has subsequently received 2 liters NS with persistent hypotension and has been started on  Norepinephrine at very low dose. She underwent CTA chest with results as below - no PE noted. She has been seen by Cardiology Nehemiah Massed). It is felt that her CP does not represent an acute coronary syndrome. At the time of this evaluation, she feels better but CP persists. Her serum lactate is mildly elevated   Review of Systems:  Gen:  Denies  fever, sweats, chills weigh loss   HEENT: Denies blurred vision, double vision, ear pain, eye pain, hearing loss, nose bleeds, sore throat  Cardiac:  +chest pain  +chest tightness  Resp:   +shortness of breath  Gi: Denies swallowing difficulty, stomach pain, nausea or vomiting, diarrhea, constipation, bowel incontinence  Gu:  Denies bladder incontinence, burning urine  Ext:   Denies Joint pain, stiffness or swelling  Skin: Denies  skin rash, easy bruising or bleeding or hives  Other:  All other systems negative   SUBJECTIVE:  Alert and awake, NAD, off vasopressors BP stable Still having chest pain  VITAL SIGNS: BP 135/72 (BP Location: Right Arm)   Pulse 99   Temp 98.3 F  (36.8 C) (Oral)   Resp (!) 28   Ht 5' 5.5" (1.664 m)   Wt 260 lb (117.9 kg)   SpO2 95%   BMI 42.61 kg/m   HEMODYNAMICS:    VENTILATOR SETTINGS: FiO2 (%):  [21 %] 21 %  INTAKE / OUTPUT: I/O last 3 completed shifts: In: 880 [P.O.:180; IV Piggyback:700] Out: 2125 [Urine:2125]  PHYSICAL EXAMINATION: General: Obese, RASS 0, NAD Neuro: CNs intact, motor intact, DTRs symmetric HEENT: NCAT, mucus membranes moist Cardiovascular: IRIR, rate controlled, no M noted Lungs: distant BS, few basilar crackles on R posteriorly, no wheezes Abdomen: Obese, soft, NT, +BS Ext: warm, no edema Skin: no lesions noted  LABS:  BMET  Recent Labs Lab 03/24/16 1150 03/25/16 0330  NA 139 138  K 4.4 3.7  CL 104 105  CO2 25 26  BUN 19 14  CREATININE 0.96 0.83  GLUCOSE 149* 128*    Electrolytes  Recent Labs Lab 03/24/16 1150 03/25/16 0330  CALCIUM 9.9 8.6*    CBC  Recent Labs Lab 03/24/16 1150 03/25/16 0330  WBC 18.3* 15.6*  HGB 15.5 14.2  HCT 46.2 41.8  PLT 218 185    Coag's  Recent Labs Lab 03/24/16 1150 03/25/16 0330  INR 2.36 2.55    Sepsis Markers  Recent Labs Lab 03/24/16 1150 03/24/16 1205 03/25/16 0330  LATICACIDVEN  --  2.8*  --   PROCALCITON <0.10  --  0.13    ABG No results for input(s): PHART, PCO2ART, PO2ART in the  last 168 hours.  Liver Enzymes  Recent Labs Lab 03/24/16 1150  AST 34  ALT 28  ALKPHOS 71  BILITOT 0.8  ALBUMIN 3.8    Cardiac Enzymes  Recent Labs Lab 03/24/16 1150  TROPONINI <0.03    Glucose  Recent Labs Lab 03/24/16 1649  GLUCAP 117*    Imaging Ct Angio Chest Pe W And/or Wo Contrast  Result Date: 03/24/2016 CLINICAL DATA:  Pleuritic chest pain, tachycardia, shortness of breath EXAM: CT ANGIOGRAPHY CHEST WITH CONTRAST TECHNIQUE: Multidetector CT imaging of the chest was performed using the standard protocol during bolus administration of intravenous contrast. Multiplanar CT image reconstructions and  MIPs were obtained to evaluate the vascular anatomy. CONTRAST:  100 cc Isovue 370 COMPARISON:  Portable chest x-ray of 03/24/2016 and CT chest of 12/16/2014 FINDINGS: Cardiovascular: The pulmonary arteries are well opacified. There is no evidence of acute pulmonary embolism. The thoracic aorta is not as well opacified but no acute abnormality is seen. The heart is mildly enlarged. No pericardial effusion is noted. Mediastinum/Nodes: No mediastinal or hilar adenopathy is seen. The thyroid gland is unremarkable. No abnormality of the thoracic esophagus is seen. Lungs/Pleura: Bibasilar linear atelectasis is noted. No parenchymal infiltrate is seen and there is no evidence of pleural effusion. The central airway is patent. Upper Abdomen: The liver appears somewhat prominent and low in attenuation possibly indicating fatty infiltration. A calcified hepatic granuloma is noted within the left lobe. Musculoskeletal: The thoracic vertebrae are in normal alignment with diffuse degenerative change present. No compression deformity is seen. Review of the MIP images confirms the above findings. IMPRESSION: 1. Negative CT angiogram of the chest. No evidence of acute pulmonary embolism. No active process is seen. 2. Question mild fatty infiltration of the liver pre Electronically Signed   By: Ivar Drape M.D.   On: 03/24/2016 13:44   Dg Chest Port 1 View  Result Date: 03/25/2016 CLINICAL DATA:  Worsening chest pain and shortness of breath. EXAM: PORTABLE CHEST 1 VIEW COMPARISON:  03/24/2016 FINDINGS: Shallow inspiration with atelectasis in the lung bases. Probable small pleural effusions. Cardiac enlargement. No pulmonary vascular congestion or edema. No pneumothorax. Postoperative changes in the cervical spine and right shoulder. IMPRESSION: Cardiac enlargement. Probable small pleural effusions with atelectasis in the lung bases. Electronically Signed   By: Lucienne Capers M.D.   On: 03/25/2016 05:39   Dg Chest Portable 1  View  Result Date: 03/24/2016 CLINICAL DATA:  Chest pain, worsening shortness of breath EXAM: PORTABLE CHEST 1 VIEW COMPARISON:  12/19/2014 FINDINGS: There is no focal parenchymal opacity. There is no pleural effusion or pneumothorax. The heart and mediastinal contours are stable. The osseous structures are unremarkable. IMPRESSION: No active disease. Electronically Signed   By: Kathreen Devoid   On: 03/24/2016 12:00     ASSESSMENT / PLAN: CARDIOVASCULAR-stable BP at this time A:  - Hypotension - unclear etiology. Onset appears to be associated with administration of metoprolol. Possible component of SIRS. Received 2 liters NS in ED - Chronic AF - rate now controlled - Atypical chest pain without ischemic changes on EKG P:  Low dose PRN metoprolol to maintain HR < 115/min Cycle cardiac markers Echocardiogram ordered to assess LV function  Empiric antacid therapy - assess response of CP to this Cardiology consult  PULMONARY A: Prior history of PNA Dyspnea Vague infiltrates on CT chest - possible History of mild intermittent asthma P:   Supplemental oxygen PRN albuterol nebs   RENAL A:   Mild AKI P:  Monitor BMET intermittently Monitor I/Os Correct electrolytes as indicated  GASTROINTESTINAL A:   No issues identified P:   SUP: IV famotidine Carb/heart diet  HEMATOLOGIC A:   No issues P:  DVT px: warfarin (chronic) Monitor CBC intermittently Transfuse per usual guidelines   ENDOCRINE A:   Mild hyperglycemia without prior history of DM P:   Monitor glu on chem panels Consider SSI for glu > 180  NEUROLOGIC A:   Pain P:   RASS goal: 0 PRN acetaminophen PRN tramadol   I have personally obtained a history, examined the patient, evaluated Pertinent laboratory and RadioGraphic/imaging results, and  formulated the assessment and plan   The Patient requires high complexity decision making for assessment and support, frequent evaluation and titration of  therapies.   Patient are satisfied with Plan of action and management. All questions answered Patient to be transferred to [facility], Dr. Fritzi Mandes notified Ok to transfer to gen med floor   Jissel Slavens Patricia Pesa, M.D.  [facility]  Medical Director [place] Director [facility] Cardio-Pulmonary Department

## 2016-03-25 NOTE — Progress Notes (Signed)
Pulse elevated to 160s again.  Spoke with Dr. Ether Griffins.  IV Cardizem 10 mg given.  Oral cardizem 300 mg - her usual daily dose - given.  Continues to wheeze, obtaining order for prn albuterol - the type of rescue she uses at home.  Bp stable at 122/81

## 2016-03-25 NOTE — Progress Notes (Signed)
Heart Rate elevated to 160's with minimal movement in the bed.  Metoprolol 5 mg given slow IVP.  Pulse decreased to 130's

## 2016-03-26 ENCOUNTER — Inpatient Hospital Stay: Payer: Medicare Other

## 2016-03-26 LAB — URINE CULTURE: Culture: >=100000 — AB

## 2016-03-26 LAB — PROCALCITONIN: PROCALCITONIN: 1.26 ng/mL

## 2016-03-26 LAB — PROTIME-INR
INR: 2.56
PROTHROMBIN TIME: 28 s — AB (ref 11.4–15.2)

## 2016-03-26 MED ORDER — ALPRAZOLAM 0.5 MG PO TABS
0.5000 mg | ORAL_TABLET | Freq: Once | ORAL | Status: AC
  Administered 2016-03-26: 0.5 mg via ORAL
  Filled 2016-03-26: qty 1

## 2016-03-26 MED ORDER — METHYLPREDNISOLONE SODIUM SUCC 125 MG IJ SOLR
125.0000 mg | Freq: Once | INTRAMUSCULAR | Status: AC
  Administered 2016-03-26: 125 mg via INTRAVENOUS
  Filled 2016-03-26: qty 2

## 2016-03-26 MED ORDER — PREDNISONE 20 MG PO TABS: 40.0000 mg | ORAL_TABLET | Freq: Every day | ORAL | Status: DC

## 2016-03-26 MED ORDER — DIGOXIN 0.25 MG/ML IJ SOLN
0.2500 mg | Freq: Once | INTRAMUSCULAR | Status: AC
  Administered 2016-03-26: 0.25 mg via INTRAVENOUS
  Filled 2016-03-26: qty 1

## 2016-03-26 MED ORDER — PREDNISONE 10 MG PO TABS
30.0000 mg | ORAL_TABLET | Freq: Every day | ORAL | Status: DC
Start: 2016-03-29 — End: 2016-03-26

## 2016-03-26 MED ORDER — DOXYCYCLINE HYCLATE 100 MG PO TABS
100.0000 mg | ORAL_TABLET | Freq: Two times a day (BID) | ORAL | Status: DC
  Administered 2016-03-26 – 2016-03-27 (×3): 100 mg via ORAL
  Filled 2016-03-26 (×3): qty 1

## 2016-03-26 MED ORDER — PREDNISONE 20 MG PO TABS: 20.0000 mg | ORAL_TABLET | Freq: Every day | ORAL | Status: DC

## 2016-03-26 MED ORDER — SODIUM CHLORIDE 0.9 % IV SOLN
500.0000 mg | Freq: Four times a day (QID) | INTRAVENOUS | Status: DC
  Administered 2016-03-26 – 2016-03-27 (×4): 500 mg via INTRAVENOUS
  Filled 2016-03-26 (×8): qty 500

## 2016-03-26 MED ORDER — PREDNISONE 10 MG PO TABS: 10.0000 mg | ORAL_TABLET | Freq: Every day | ORAL | Status: DC

## 2016-03-26 MED ORDER — FUROSEMIDE 10 MG/ML IJ SOLN
40.0000 mg | Freq: Once | INTRAMUSCULAR | Status: AC
  Administered 2016-03-26: 40 mg via INTRAVENOUS
  Filled 2016-03-26: qty 4

## 2016-03-26 MED ORDER — ENSURE ENLIVE PO LIQD
237.0000 mL | Freq: Two times a day (BID) | ORAL | Status: DC
  Administered 2016-03-26 – 2016-04-01 (×13): 237 mL via ORAL

## 2016-03-26 MED ORDER — PREDNISONE 50 MG PO TABS: 50.0000 mg | ORAL_TABLET | Freq: Every day | ORAL | Status: DC

## 2016-03-26 MED ORDER — METHYLPREDNISOLONE SODIUM SUCC 125 MG IJ SOLR
60.0000 mg | Freq: Four times a day (QID) | INTRAMUSCULAR | Status: DC
  Administered 2016-03-26 – 2016-03-28 (×8): 60 mg via INTRAVENOUS
  Filled 2016-03-26 (×9): qty 2

## 2016-03-26 NOTE — Progress Notes (Signed)
[facility] Encounter Note  Patient: Laura Gonzalez / Admit Date: 03/24/2016 / Date of Encounter: 03/26/2016, 8:55 AM   Subjective: Patient has improved chest pain with less concern although intermittent episodes when moving around. Shortness of breath also improved. Telemetry shows sinus tachycardia but no EKG changes. No evidence of myocardial infarction with normal troponin Echocardiogram shows normal LV systolic function and no current evidence of significant valvular heart disease Review of Systems: Positive for: Shortness of breath weakness Negative for: Vision change, hearing change, syncope, dizziness, nausea, vomiting,diarrhea, bloody stool, stomach pain, cough, congestion, diaphoresis, urinary frequency, urinary pain,skin lesions, skin rashes Others previously listed  Objective: Telemetry: Sinus tachycardia Physical Exam: Blood pressure 124/66, pulse (!) 102, temperature 98.6 F (37 C), temperature source Oral, resp. rate (!) 31, height 5' 5.5" (1.664 m), weight 117.9 kg (260 lb), SpO2 92 %. Body mass index is 42.61 kg/m. General: Well developed, well nourished, in no acute distress. Head: Normocephalic, atraumatic, sclera non-icteric, no xanthomas, nares are without discharge. Neck: No apparent masses Lungs: Normal respirations with few wheezes, some rhonchi, no rales , no crackles   Heart: Regular rate and rhythm, normal S1 S2, no murmur, no rub, no gallop, PMI is normal size and placement, carotid upstroke normal without bruit, jugular venous pressure normal Abdomen: Soft, non-tender,  distended with normoactive bowel sounds. No hepatosplenomegaly. Abdominal aorta is normal size without bruit Extremities trace edema, no clubbing, no cyanosis, no ulcers,  Peripheral: 2+ radial, 2+ femoral, 2+ dorsal pedal pulses Neuro: Alert and oriented. Moves all extremities spontaneously. Psych:  Responds to questions appropriately with a normal  affect.   Intake/Output Summary (Last 24 hours) at 03/26/16 0855 Last data filed at 03/26/16 0700  Gross per 24 hour  Intake           753.38 ml  Output              675 ml  Net            78.38 ml    Inpatient Medications:  . amiodarone  150 mg Intravenous Once  . aspirin EC  325 mg Oral Daily  . calcium citrate-vitamin D  1 tablet Oral BID  . diltiazem  300 mg Oral Daily  . ezetimibe  10 mg Oral Daily  . fluticasone  1 spray Each Nare Daily  . hydrochlorothiazide  25 mg Oral Daily  . levalbuterol  1.25 mg Nebulization Q4H  . levofloxacin (LEVAQUIN) IV  750 mg Intravenous Q24H  . loratadine  10 mg Oral Daily  . magnesium oxide  400 mg Oral Daily  . metoprolol tartrate  12.5 mg Oral BID  . montelukast  10 mg Oral QHS  . omega-3 acid ethyl esters  1 g Oral BID  . potassium chloride  20 mEq Oral TID  . [START ON 03/27/2016] predniSONE  50 mg Oral Q breakfast   Followed by  . [START ON 03/28/2016] predniSONE  40 mg Oral Q breakfast   Followed by  . [START ON 03/29/2016] predniSONE  30 mg Oral Q breakfast   Followed by  . [START ON 03/30/2016] predniSONE  20 mg Oral Q breakfast   Followed by  . [START ON 03/31/2016] predniSONE  10 mg Oral Q breakfast  . vitamin B-12  1,000 mcg Oral Daily  . warfarin  3 mg Oral q1800  . Warfarin - Pharmacist Dosing Inpatient   Does not apply q1800   Infusions:  . diltiazem (CARDIZEM) infusion 15 mg/hr (03/26/16  0230)  . phenylephrine (NEO-SYNEPHRINE) Adult infusion      Labs:  Recent Labs  03/24/16 1150 03/25/16 0330  NA 139 138  K 4.4 3.7  CL 104 105  CO2 25 26  GLUCOSE 149* 128*  BUN 19 14  CREATININE 0.96 0.83  CALCIUM 9.9 8.6*    Recent Labs  03/24/16 1150  AST 34  ALT 28  ALKPHOS 71  BILITOT 0.8  PROT 7.5  ALBUMIN 3.8    Recent Labs  03/24/16 1150 03/25/16 0330  WBC 18.3* 15.6*  NEUTROABS 16.2*  --   HGB 15.5 14.2  HCT 46.2 41.8  MCV 94.5 93.9  PLT 218 185    Recent Labs  03/24/16 1150  TROPONINI  <0.03   Invalid input(s): POCBNP No results for input(s): HGBA1C in the last 72 hours.   Weights: Filed Weights   03/24/16 1138  Weight: 117.9 kg (260 lb)     Radiology/Studies:  Ct Angio Chest Pe W And/or Wo Contrast  Result Date: 03/24/2016 CLINICAL DATA:  Pleuritic chest pain, tachycardia, shortness of breath EXAM: CT ANGIOGRAPHY CHEST WITH CONTRAST TECHNIQUE: Multidetector CT imaging of the chest was performed using the standard protocol during bolus administration of intravenous contrast. Multiplanar CT image reconstructions and MIPs were obtained to evaluate the vascular anatomy. CONTRAST:  100 cc Isovue 370 COMPARISON:  Portable chest x-ray of 03/24/2016 and CT chest of 12/16/2014 FINDINGS: Cardiovascular: The pulmonary arteries are well opacified. There is no evidence of acute pulmonary embolism. The thoracic aorta is not as well opacified but no acute abnormality is seen. The heart is mildly enlarged. No pericardial effusion is noted. Mediastinum/Nodes: No mediastinal or hilar adenopathy is seen. The thyroid gland is unremarkable. No abnormality of the thoracic esophagus is seen. Lungs/Pleura: Bibasilar linear atelectasis is noted. No parenchymal infiltrate is seen and there is no evidence of pleural effusion. The central airway is patent. Upper Abdomen: The liver appears somewhat prominent and low in attenuation possibly indicating fatty infiltration. A calcified hepatic granuloma is noted within the left lobe. Musculoskeletal: The thoracic vertebrae are in normal alignment with diffuse degenerative change present. No compression deformity is seen. Review of the MIP images confirms the above findings. IMPRESSION: 1. Negative CT angiogram of the chest. No evidence of acute pulmonary embolism. No active process is seen. 2. Question mild fatty infiltration of the liver pre Electronically Signed   By: Ivar Drape M.D.   On: 03/24/2016 13:44   Dg Chest Port 1 View  Result Date:  03/26/2016 CLINICAL DATA:  Dyspnea EXAM: PORTABLE CHEST 1 VIEW COMPARISON:  03/25/2016 FINDINGS: Cardiac enlargement. Small bilateral pleural effusions. Atelectasis in the lung bases. No pulmonary vascular congestion or edema. No pneumothorax. No significant changes since previous study. IMPRESSION: Shallow inspiration with small bilateral pleural effusions and atelectasis in the lung bases similar to previous study. Electronically Signed   By: Lucienne Capers M.D.   On: 03/26/2016 06:15   Dg Chest Port 1 View  Result Date: 03/25/2016 CLINICAL DATA:  Worsening chest pain and shortness of breath. EXAM: PORTABLE CHEST 1 VIEW COMPARISON:  03/24/2016 FINDINGS: Shallow inspiration with atelectasis in the lung bases. Probable small pleural effusions. Cardiac enlargement. No pulmonary vascular congestion or edema. No pneumothorax. Postoperative changes in the cervical spine and right shoulder. IMPRESSION: Cardiac enlargement. Probable small pleural effusions with atelectasis in the lung bases. Electronically Signed   By: Lucienne Capers M.D.   On: 03/25/2016 05:39   Dg Chest Portable 1 View  Result Date:  03/24/2016 CLINICAL DATA:  Chest pain, worsening shortness of breath EXAM: PORTABLE CHEST 1 VIEW COMPARISON:  12/19/2014 FINDINGS: There is no focal parenchymal opacity. There is no pleural effusion or pneumothorax. The heart and mediastinal contours are stable. The osseous structures are unremarkable. IMPRESSION: No active disease. Electronically Signed   By: Kathreen Devoid   On: 03/24/2016 12:00     Assessment and Recommendation  75 y.o. female with the known cardiovascular disease with paroxysmal nonvalvular atrial fibrillation currently remaining in normal sinus rhythm with some tachycardia on metoprolol without evidence of myocardial infarction although chest pain is relieved with concerns of possible infection or inflammation 1. Continue high medication management including metoprolol for heart rate  control and maintenance of normal rhythmAs long as patient doesn't have episodes of hypotension 2. No change in warfarin for further risk reduction in stroke with atrial fibrillation 3. No further cardiac intervention or diagnostics necessary at this time 4. Further treatment of and/or a supportive care of illness and/or inflammatory infection causing symptoms listed above 5. Ambulate and further adjustments of medications but no other further cardiac interventions at this time  Signed, Serafina Royals M.D. FACC

## 2016-03-26 NOTE — Progress Notes (Signed)
Pt. Placed on BIPAP 14/7 FiO2 100%  due to increased work of breathing. Shortness of breath. Pt. Uses cpap at home at night.  Pt. Tolerating well SpO2 upper 90's. Pt. Resting comfortably without distress. Will continue to wean FiO2 as tolerated.

## 2016-03-26 NOTE — Progress Notes (Signed)
Cardizem drip decreased by 5 mg. To 5 mg/ hr.  Heart rate in the last hour was 63 - 79.  SBP 116 - 127.  Dr. Earleen Newport would like the cardizem dose reduced further.

## 2016-03-26 NOTE — Progress Notes (Signed)
Pharmacy Antibiotic Note  Laura Gonzalez is a 75 y.o. female admitted on 03/24/2016 with UTI.  Pharmacy has been consulted for Primaxin dosing.  Plan: Imipenem 500 mg IV q6h. Of note MD noted in his note that he is aware that pt has an allergy to PCN and chose imipenem. Tried to discuss with RN but RN covering for lunch said pt's RN was not available and would not address questions; RN also stated pt was transferring to the unit soon.   Height: 5' 5.5" (166.4 cm) Weight: 260 lb (117.9 kg) IBW/kg (Calculated) : 58.15  Temp (24hrs), Avg:98.9 F (37.2 C), Min:98.6 F (37 C), Max:99.3 F (37.4 C)   Recent Labs Lab 03/24/16 1150 03/24/16 1205 03/25/16 0330  WBC 18.3*  --  15.6*  CREATININE 0.96  --  0.83  LATICACIDVEN  --  2.8*  --     Estimated Creatinine Clearance: 77.1 mL/min (by C-G formula based on SCr of 0.83 mg/dL).    Allergies  Allergen Reactions  . Amoxicillin Other (See Comments)    Lips swelling, tingling  . Benzocaine-Menthol     Throat swelling  . Biafine [Wound Dressings] Swelling  . Chloraseptic Sore Throat [Acetaminophen] Other (See Comments)    throat swelling  . Fosamax [Alendronate Sodium]   . Neosporin [Neomycin-Bacitracin Zn-Polymyx] Other (See Comments)    Skin redness, puffy  . Other     "chlorotrimitron"-- rxn: throat swelling  . Oxycodone Other (See Comments)    Dizziness, lips tingling  . Statins Other (See Comments)    Muscle weakness, weak  . Tape Other (See Comments)    Skin redness    Thank you for allowing pharmacy to be a part of this patient's care.  Rocky Morel 03/26/2016 1:34 PM

## 2016-03-26 NOTE — Progress Notes (Signed)
PT Cancellation Note  Patient Details Name: Laura Gonzalez MRN: [id number] DOB: [birth year]   Cancelled Treatment:    Reason Eval/Treat Not Completed: Patient not medically ready; Pt transferred out of ICU on 10/4 and now transferring back to ICU today 10/5.  Patient currently on 100% nonrebreather and using asessory muscles to breathe per MD note.  Pt not appropriate at this time.  Please re-consult when medically appropriate for PT.  Olander Friedl A Alida Greiner, PT 03/26/2016, 2:08 PM

## 2016-03-26 NOTE — Progress Notes (Signed)
Transferred to the ICU for better management of respiratory distress.  Cardizem drip continues at 5 mg/ her.  Patient is alert and oriented and denies pain.

## 2016-03-26 NOTE — Care Management (Signed)
Patient was transferred to 2A from icu 10.4.2017.  At present time, patient is so short of breath that she is not abe to carry over verbal conversation.  She is on 100% non rebreather.  she is also on cardizem drip to control heart rate.

## 2016-03-26 NOTE — Progress Notes (Signed)
Pt transferred to stepdown unit due to acute on chronic hypoxic respiratory failure secondary to pulmonary edema.  Pt mildly tachypneic with c/o shortness of breath currently on nonrebreather with O2 sats 93 to 94%, she is alert and oriented, atrial fibb rate controlled on cardiac monitor.  Stat CXR, 40 mg iv lasix x1, continue Xopenex, and prn Bipap for dyspnea.  Will repeat CXR in the am 03-27-16.  Marda Stalker, [place] Pager [phone] (please enter 7 digits) [place] Pager [phone] (please enter 7 digits)    Corrin Parker, M.D.  [facility]  Medical Director [place] Director [place] Department

## 2016-03-26 NOTE — Progress Notes (Signed)
Pt cannot tolerate BiPAP any longer, pt placed back on non rebreather. Respiratory therapy called, respiratory to give breathing treatment early, RT suggested to call MD as well to see if there was anything else they wanted to do/add. MD paged. Dr. Marcille Blanco updated on pt, and wanted to start pt on steroids & will come up to see pt as well. .will give IV steroid & continue to monitor. Conley Simmonds, RN

## 2016-03-26 NOTE — Care Management Important Message (Signed)
Important Message  Patient Details  Name: KENETRA FUJIMURA MRN: [id number] Date of Birth: [birth year]   Medicare Important Message Given:  Yes    Katrina Stack, RN 03/26/2016, 9:49 AM

## 2016-03-26 NOTE — Progress Notes (Signed)
Patient transferred from 2A for respiratory distress to be on bipap.  Patient currently on 5L [place] with O2 sat 88 and above.  .  Continues to be afib on cardiac monitor, rate controlled on 70ml/hr on cardizem gtt.  Foley in place and patent, output adequate urine this shift. Denies pain and discomfort, currently in bed watching TV in no apparent distress.  Plan to go back on bipap later.

## 2016-03-26 NOTE — Progress Notes (Signed)
Heart rate 76 - 102 since 0700. SBP  112 - 142.  Cardizem drip decreased to 10 mg / hr.  Will monitor HR and SBP q 15 min x 4.

## 2016-03-26 NOTE — Progress Notes (Signed)
Pt HR sustaining in the 170's while laying in bed. Pt also having increased work of breathing while on 3L O2, pt had been given IV metoprolol & IV cardizem by day shift RN, but is still having heart rates in the 170's. amiodarone bolus, IV lasix, and albuterol orders are in the [facility] to be given but have not been given by day shift RN, I paged MD, because pt does not need albuterol since albuterol will increase pt's heart rate. Dr. Darvin Neighbours called back, he will D/C albuterol & change to xopenex, I also asked him if he wanted to continue with the amiodarone bolus or if he wanted to do something else since heart rate is so high. Dr. Darvin Neighbours would like to start patient on cardizem drip at 50ml/hr. I also got an order for a foley since patient will be getting lasix & does not need to be ambulating to the bathroom with her heart rate being that high. Dr. Darvin Neighbours will place order. All orders will be carried out & will continue to monitor. Conley Simmonds, RN

## 2016-03-26 NOTE — Progress Notes (Signed)
ANTICOAGULATION CONSULT NOTE - Follow up [place] for Warfarin Indication: atrial fibrillation  Allergies  Allergen Reactions  . Amoxicillin Other (See Comments)    Lips swelling, tingling  . Benzocaine-Menthol     Throat swelling  . Biafine [Wound Dressings] Swelling  . Chloraseptic Sore Throat [Acetaminophen] Other (See Comments)    throat swelling  . Fosamax [Alendronate Sodium]   . Neosporin [Neomycin-Bacitracin Zn-Polymyx] Other (See Comments)    Skin redness, puffy  . Other     "chlorotrimitron"-- rxn: throat swelling  . Oxycodone Other (See Comments)    Dizziness, lips tingling  . Statins Other (See Comments)    Muscle weakness, weak  . Tape Other (See Comments)    Skin redness    Patient Measurements: Height: 5' 5.5" (166.4 cm) Weight: 260 lb (117.9 kg) IBW/kg (Calculated) : 58.15  Vital Signs: Temp: 98.6 F (37 C) (10/05 0802) Temp Source: Oral (10/05 0802) BP: 122/63 (10/05 1002) Pulse Rate: 101 (10/05 1002)  Labs:  Recent Labs  03/24/16 1150 03/25/16 0330 03/26/16 0427  HGB 15.5 14.2  --   HCT 46.2 41.8  --   PLT 218 185  --   LABPROT 26.2* 27.9* 28.0*  INR 2.36 2.55 2.56  CREATININE 0.96 0.83  --   TROPONINI <0.03  --   --     Estimated Creatinine Clearance: 77.1 mL/min (by C-G formula based on SCr of 0.83 mg/dL).   Assessment: 75 y/o F with a h/o AFib admitted with CP. Patient on warfarin 4 mg daily PTA with therapeutic INR on admission. Pt also currently ordered levofloxacin.   10/3  INR 2.36 - warfarin 3 mg 10/4  INR 2.55 - warfarin 3 mg 10/5  INR 2.56  Goal of Therapy:  INR 2-3   Plan:  INR therapeutic today. Patient started on Levaquin which may increase INR while on warfarin. Will continue warfarin dosing slightly lower than PTA dosing at 3 mg daily and f/u AM INR.   Rayna Sexton L 03/26/2016,11:22 AM

## 2016-03-26 NOTE — Progress Notes (Signed)
Patient ID: [name], female   DOB: Jun 29, 1940, 75 y.o.   MRN: [id number]  [facility] PROGRESS NOTE  Laura Gonzalez [id number] DOB: 1941-04-07 DOA: 03/24/2016 PCP: [name], MD  HPI/Subjective: Patient on 100% nonrebreather and using asessory muscles to breath, feels a little bit better than yesterday, still with wheezing. Came in with fever, cough and shortness of breath and rapid atrial fibrillation.  Objective: Vitals:   03/26/16 1232 03/26/16 1249  BP: (!) 127/51 (!) 119/52  Pulse: 79 76  Resp:    Temp:      Filed Weights   03/24/16 1138  Weight: 117.9 kg (260 lb)    ROS: Review of Systems  Constitutional: Negative for chills and fever.  Eyes: Negative for blurred vision.  Respiratory: Positive for cough, shortness of breath and wheezing.   Cardiovascular: Positive for palpitations. Negative for chest pain.  Gastrointestinal: Negative for abdominal pain, constipation, diarrhea, nausea and vomiting.  Genitourinary: Negative for dysuria.  Musculoskeletal: Positive for joint pain.  Neurological: Negative for dizziness and headaches.   Exam: Physical Exam  Constitutional: She is oriented to person, place, and time.  HENT:  Nose: No mucosal edema.  Mouth/Throat: No oropharyngeal exudate or posterior oropharyngeal edema.  Eyes: Conjunctivae, EOM and lids are normal. Pupils are equal, round, and reactive to light.  Neck: No JVD present. Carotid bruit is not present. No edema present. No thyroid mass and no thyromegaly present.  Cardiovascular: S1 normal and S2 normal.  An irregularly irregular rhythm present. Exam reveals no gallop.   Murmur heard.  Systolic murmur is present with a grade of 2/6  Pulses:      Dorsalis pedis pulses are 2+ on the right side, and 2+ on the left side.  Respiratory: Accessory muscle usage present. She is in respiratory distress. She has decreased breath sounds in the right middle field, the right lower field, the left  middle field and the left lower field. She has wheezes in the right middle field, the right lower field, the left middle field and the left lower field. She has no rhonchi. She has no rales.  GI: Soft. Bowel sounds are normal. There is no tenderness.  Musculoskeletal:       Right ankle: She exhibits swelling.       Left ankle: She exhibits swelling.  Lymphadenopathy:    She has no cervical adenopathy.  Neurological: She is alert and oriented to person, place, and time. No cranial nerve deficit.  Skin: Skin is warm. No rash noted. Nails show no clubbing.  Psychiatric: She has a normal mood and affect.      Data Reviewed: Basic Metabolic Panel:  Recent Labs Lab 03/24/16 1150 03/25/16 0330  NA 139 138  K 4.4 3.7  CL 104 105  CO2 25 26  GLUCOSE 149* 128*  BUN 19 14  CREATININE 0.96 0.83  CALCIUM 9.9 8.6*   Liver Function Tests:  Recent Labs Lab 03/24/16 1150  AST 34  ALT 28  ALKPHOS 71  BILITOT 0.8  PROT 7.5  ALBUMIN 3.8    Recent Labs Lab 03/24/16 1150  LIPASE 20  AMYLASE 36   CBC:  Recent Labs Lab 03/24/16 1150 03/25/16 0330  WBC 18.3* 15.6*  NEUTROABS 16.2*  --   HGB 15.5 14.2  HCT 46.2 41.8  MCV 94.5 93.9  PLT 218 185   Cardiac Enzymes:  Recent Labs Lab 03/24/16 1150  TROPONINI <0.03    CBG:  Recent Labs Lab 03/24/16  [place]    Recent Results (from the past 240 hour(s))  Blood culture (routine x 2)     Status: None (Preliminary result)   Collection Time: 03/24/16 11:50 AM  Result Value Ref Range Status   Specimen Description BLOOD LEFT AC  Final   Special Requests BOTTLES DRAWN AEROBIC AND ANAEROBIC 4ML  Final   Culture NO GROWTH 2 DAYS  Final   Report Status PENDING  Incomplete  Blood culture (routine x 2)     Status: None (Preliminary result)   Collection Time: 03/24/16 11:50 AM  Result Value Ref Range Status   Specimen Description BLOOD RIGHT HAND  Final   Special Requests   Final    BOTTLES DRAWN AEROBIC AND  ANAEROBIC AER 7ML ANA 6ML   Culture NO GROWTH 2 DAYS  Final   Report Status PENDING  Incomplete  Urine culture     Status: Abnormal   Collection Time: 03/24/16  2:09 PM  Result Value Ref Range Status   Specimen Description URINE, CLEAN CATCH  Final   Special Requests NONE  Final   Culture >=100,000 COLONIES/mL ESCHERICHIA COLI (A)  Final   Report Status 03/26/2016 FINAL  Final   Organism ID, Bacteria ESCHERICHIA COLI (A)  Final      Susceptibility   Escherichia coli - MIC*    AMPICILLIN 8 SENSITIVE Sensitive     CEFAZOLIN <=4 SENSITIVE Sensitive     CEFTRIAXONE <=1 SENSITIVE Sensitive     CIPROFLOXACIN >=4 RESISTANT Resistant     GENTAMICIN <=1 SENSITIVE Sensitive     IMIPENEM <=0.25 SENSITIVE Sensitive     NITROFURANTOIN 32 SENSITIVE Sensitive     TRIMETH/SULFA <=20 SENSITIVE Sensitive     AMPICILLIN/SULBACTAM 4 SENSITIVE Sensitive     PIP/TAZO <=4 SENSITIVE Sensitive     Extended ESBL NEGATIVE Sensitive     * >=100,000 COLONIES/mL ESCHERICHIA COLI  MRSA PCR Screening     Status: None   Collection Time: 03/24/16  5:20 PM  Result Value Ref Range Status   MRSA by PCR NEGATIVE NEGATIVE Final    Comment:        The GeneXpert MRSA Assay (FDA approved for NASAL specimens only), is one component of a comprehensive MRSA colonization surveillance program. It is not intended to diagnose MRSA infection nor to guide or monitor treatment for MRSA infections.      Studies: Ct Angio Chest Pe W And/or Wo Contrast  Result Date: 03/24/2016 CLINICAL DATA:  Pleuritic chest pain, tachycardia, shortness of breath EXAM: CT ANGIOGRAPHY CHEST WITH CONTRAST TECHNIQUE: Multidetector CT imaging of the chest was performed using the standard protocol during bolus administration of intravenous contrast. Multiplanar CT image reconstructions and MIPs were obtained to evaluate the vascular anatomy. CONTRAST:  100 cc Isovue 370 COMPARISON:  Portable chest x-ray of 03/24/2016 and CT chest of 12/16/2014  FINDINGS: Cardiovascular: The pulmonary arteries are well opacified. There is no evidence of acute pulmonary embolism. The thoracic aorta is not as well opacified but no acute abnormality is seen. The heart is mildly enlarged. No pericardial effusion is noted. Mediastinum/Nodes: No mediastinal or hilar adenopathy is seen. The thyroid gland is unremarkable. No abnormality of the thoracic esophagus is seen. Lungs/Pleura: Bibasilar linear atelectasis is noted. No parenchymal infiltrate is seen and there is no evidence of pleural effusion. The central airway is patent. Upper Abdomen: The liver appears somewhat prominent and low in attenuation possibly indicating fatty infiltration. A calcified hepatic granuloma is noted within the left lobe.  Musculoskeletal: The thoracic vertebrae are in normal alignment with diffuse degenerative change present. No compression deformity is seen. Review of the MIP images confirms the above findings. IMPRESSION: 1. Negative CT angiogram of the chest. No evidence of acute pulmonary embolism. No active process is seen. 2. Question mild fatty infiltration of the liver pre Electronically Signed   By: Ivar Drape M.D.   On: 03/24/2016 13:44   Dg Chest Port 1 View  Result Date: 03/26/2016 CLINICAL DATA:  Dyspnea EXAM: PORTABLE CHEST 1 VIEW COMPARISON:  03/25/2016 FINDINGS: Cardiac enlargement. Small bilateral pleural effusions. Atelectasis in the lung bases. No pulmonary vascular congestion or edema. No pneumothorax. No significant changes since previous study. IMPRESSION: Shallow inspiration with small bilateral pleural effusions and atelectasis in the lung bases similar to previous study. Electronically Signed   By: Lucienne Capers M.D.   On: 03/26/2016 06:15   Dg Chest Port 1 View  Result Date: 03/25/2016 CLINICAL DATA:  Worsening chest pain and shortness of breath. EXAM: PORTABLE CHEST 1 VIEW COMPARISON:  03/24/2016 FINDINGS: Shallow inspiration with atelectasis in the lung bases.  Probable small pleural effusions. Cardiac enlargement. No pulmonary vascular congestion or edema. No pneumothorax. Postoperative changes in the cervical spine and right shoulder. IMPRESSION: Cardiac enlargement. Probable small pleural effusions with atelectasis in the lung bases. Electronically Signed   By: Lucienne Capers M.D.   On: 03/25/2016 05:39    Scheduled Meds: . aspirin EC  325 mg Oral Daily  . calcium citrate-vitamin D  1 tablet Oral BID  . diltiazem  300 mg Oral Daily  . ezetimibe  10 mg Oral Daily  . feeding supplement (ENSURE ENLIVE)  237 mL Oral BID BM  . fluticasone  1 spray Each Nare Daily  . hydrochlorothiazide  25 mg Oral Daily  . levalbuterol  1.25 mg Nebulization Q4H  . levofloxacin (LEVAQUIN) IV  750 mg Intravenous Q24H  . loratadine  10 mg Oral Daily  . magnesium oxide  400 mg Oral Daily  . methylPREDNISolone (SOLU-MEDROL) injection  60 mg Intravenous Q6H  . metoprolol tartrate  12.5 mg Oral BID  . montelukast  10 mg Oral QHS  . omega-3 acid ethyl esters  1 g Oral BID  . potassium chloride  20 mEq Oral TID  . vitamin B-12  1,000 mcg Oral Daily  . warfarin  3 mg Oral q1800  . Warfarin - Pharmacist Dosing Inpatient   Does not apply q1800   Continuous Infusions: . diltiazem (CARDIZEM) infusion 5 mg/hr (03/26/16 1234)    Assessment/Plan:  1. Acute respiratory failure with hypoxia. Patient on 100% nonrebreather on the floor using accessory muscles to breathe. Transfer back to the ICU for closer monitoring of respiratory status. Reconsult critical care team.respiratory status still too tenuous for floor care at this point. 2. Asthma exacerbation. Switch redness own back to Solu-Medrol 60 mg every 6 with first dose now. Nebulizer treatments. Add budesonide nebulizers. 3. Clinical sepsis upon presentation with hypotension and likely bronchitis. Patient was on Levaquin but her urinary tract infection is resistant area and switched to imipenem at this point since the  patient does have a allergy to penicillin. 4. Rapid atrial fibrillation on presentation. Heart rate tter controlled. Asked nurse to taper down Cardizem drip hopefully to off. Continue metoprolol and Cardizem orally.patient therapeutic on Coumadin 5. Morbid obesity weight loss needed 6. History of sleep apnea. Weight loss needed. Patient unable to tolerate BiPAP last night 7. Hyperlipidemia unspecified on Zetia 8. Essential hypertension on hydrochlorothiazide  Code Status:     Code Status Orders        Start     Ordered   03/24/16 1507  Full code  Continuous     03/24/16 1511    Code Status History    Date Active Date Inactive Code Status Order ID Comments User Context   12/12/2014  3:01 AM 12/20/2014  7:58 PM Full Code [id number]  Lance Coon, MD Inpatient     Disposition Plan: to be determined  Consultants:  cardiology  Critical care specialist  Antibiotics: - imipenem  Time spent: 35 minutes. Patient will go back to the critical care unit for closer monitoring of respiratory status.  Loletha Grayer  [employer]

## 2016-03-27 ENCOUNTER — Inpatient Hospital Stay: Payer: Medicare Other

## 2016-03-27 DIAGNOSIS — J9601 Acute respiratory failure with hypoxia: Secondary | ICD-10-CM

## 2016-03-27 LAB — BASIC METABOLIC PANEL
ANION GAP: 6 (ref 5–15)
BUN: 28 mg/dL — AB (ref 6–20)
CHLORIDE: 105 mmol/L (ref 101–111)
CO2: 28 mmol/L (ref 22–32)
Calcium: 9.8 mg/dL (ref 8.9–10.3)
Creatinine, Ser: 0.84 mg/dL (ref 0.44–1.00)
GFR calc Af Amer: >60 mL/min (ref >60)
GLUCOSE: 183 mg/dL — AB (ref 65–99)
POTASSIUM: 4.3 mmol/L (ref 3.5–5.1)
Sodium: 139 mmol/L (ref 135–145)

## 2016-03-27 LAB — CBC
HEMATOCRIT: 40.9 % (ref 35.0–47.0)
HEMOGLOBIN: 13.8 g/dL (ref 12.0–16.0)
MCH: 31.9 pg (ref 26.0–34.0)
MCHC: 33.7 g/dL (ref 32.0–36.0)
MCV: 94.8 fL (ref 80.0–100.0)
Platelets: 189 10*3/uL (ref 150–440)
RBC: 4.31 MIL/uL (ref 3.80–5.20)
RDW: 14.4 % (ref 11.5–14.5)
WBC: 16.2 10*3/uL — ABNORMAL HIGH (ref 3.6–11.0)

## 2016-03-27 LAB — PROTIME-INR
INR: 2.88
Prothrombin Time: 30.8 seconds — ABNORMAL HIGH (ref 11.4–15.2)

## 2016-03-27 MED ORDER — DIGOXIN 0.25 MG/ML IJ SOLN
0.5000 mg | Freq: Once | INTRAMUSCULAR | Status: AC
  Administered 2016-03-27: 0.5 mg via INTRAVENOUS
  Filled 2016-03-27: qty 2

## 2016-03-27 MED ORDER — WARFARIN SODIUM 1 MG PO TABS
1.0000 mg | ORAL_TABLET | Freq: Every day | ORAL | Status: DC
  Administered 2016-03-27 – 2016-03-28 (×2): 1 mg via ORAL
  Filled 2016-03-27 (×2): qty 1

## 2016-03-27 MED ORDER — METOPROLOL TARTRATE 5 MG/5ML IV SOLN
5.0000 mg | Freq: Once | INTRAVENOUS | Status: AC
  Administered 2016-03-27: 5 mg via INTRAVENOUS
  Filled 2016-03-27: qty 5

## 2016-03-27 MED ORDER — DEXTROSE 5 % IV SOLN
1.0000 g | Freq: Every day | INTRAVENOUS | Status: AC
  Administered 2016-03-27 – 2016-03-30 (×4): 1 g via INTRAVENOUS
  Filled 2016-03-27 (×4): qty 10

## 2016-03-27 MED ORDER — DEXTROSE 5 % IV SOLN
500.0000 mg | Freq: Every day | INTRAVENOUS | Status: DC
  Administered 2016-03-27 – 2016-03-29 (×3): 500 mg via INTRAVENOUS
  Filled 2016-03-27 (×4): qty 500

## 2016-03-27 MED ORDER — IPRATROPIUM-ALBUTEROL 0.5-2.5 (3) MG/3ML IN SOLN
3.0000 mL | RESPIRATORY_TRACT | Status: DC
  Administered 2016-03-27 – 2016-03-30 (×21): 3 mL via RESPIRATORY_TRACT
  Filled 2016-03-27 (×20): qty 3

## 2016-03-27 NOTE — Plan of Care (Signed)
Problem: Tissue Perfusion: Goal: Risk factors for ineffective tissue perfusion will decrease Outcome: Progressing BP 117/68   Pulse (!) 102   Temp 97.8 F (36.6 C) (Axillary)   Resp (!) 26   Ht 5' 5.5" (1.664 m)   Wt 117.9 kg (260 lb)   SpO2 91%   BMI 42.61 kg/m   POC reviewed with patient, cont to encourage q2 repositioning while in bed, vital signs closely monitored and any concerns addressed at this time. Will continue to monitor.  CMP Latest Ref Rng & Units 03/27/2016 03/25/2016 03/24/2016  Glucose 65 - 99 mg/dL 183(H) 128(H) 149(H)  BUN 6 - 20 mg/dL 28(H) 14 19  Creatinine 0.44 - 1.00 mg/dL 0.84 0.83 0.96  Sodium 135 - 145 mmol/L 139 138 139  Potassium 3.5 - 5.1 mmol/L 4.3 3.7 4.4  Chloride 101 - 111 mmol/L 105 105 104  CO2 22 - 32 mmol/L 28 26 25   Calcium 8.9 - 10.3 mg/dL 9.8 8.6(L) 9.9  Total Protein 6.5 - 8.1 g/dL - - 7.5  Total Bilirubin 0.3 - 1.2 mg/dL - - 0.8  Alkaline Phos 38 - 126 U/L - - 71  AST 15 - 41 U/L - - 34  ALT 14 - 54 U/L - - 28    Pt on 5 l/min nasal canula and tolerating it well- humidified  Intermittent pain to neck and chest, uses prn tramadol with relief      Mental Orientation: A&O x 4 Telemetry: Pt placed on monitor. Central tele and Elink aware  Assessment: Completed Skin: wnl  IV: flushes easily, no pain, no blood return Pain: no pain  Environmental changes completed to facilitate rest and relaxation.  Safety Measures: Bed alarm obn, 2/4 bed rails up.  Unit Orientation: Pt  oriented to room, has received patient guide, and taught how to use call bell system.   Good meal appetite

## 2016-03-27 NOTE — Plan of Care (Signed)
Contacted MD concerned about pt increase in HR 135-148 this afternoon, unable to titrate the cardizem drip due to SBP being less than 110 per orders. Awaiting feedback

## 2016-03-27 NOTE — Progress Notes (Signed)
ANTICOAGULATION CONSULT NOTE - Follow up [place] for Warfarin Indication: atrial fibrillation  Allergies  Allergen Reactions  . Amoxicillin Other (See Comments)    Lips swelling, tingling  . Benzocaine-Menthol     Throat swelling  . Biafine [Wound Dressings] Swelling  . Chloraseptic Sore Throat [Acetaminophen] Other (See Comments)    throat swelling  . Fosamax [Alendronate Sodium]   . Neosporin [Neomycin-Bacitracin Zn-Polymyx] Other (See Comments)    Skin redness, puffy  . Other     "chlorotrimitron"-- rxn: throat swelling  . Oxycodone Other (See Comments)    Dizziness, lips tingling  . Statins Other (See Comments)    Muscle weakness, weak  . Tape Other (See Comments)    Skin redness    Patient Measurements: Height: 5' 5.5" (166.4 cm) Weight: 260 lb (117.9 kg) IBW/kg (Calculated) : 58.15  Vital Signs: Temp: 97.7 F (36.5 C) (10/06 1100) Temp Source: Oral (10/06 1100) BP: 122/68 (10/06 1510) Pulse Rate: 63 (10/06 1510)  Labs:  Recent Labs  03/25/16 0330 03/26/16 0427 03/27/16 0342  HGB 14.2  --  13.8  HCT 41.8  --  40.9  PLT 185  --  189  LABPROT 27.9* 28.0* 30.8*  INR 2.55 2.56 2.88  CREATININE 0.83  --  0.84    Estimated Creatinine Clearance: 76.2 mL/min (by C-G formula based on SCr of 0.84 mg/dL).   Assessment: 75 y/o F with a h/o AFib admitted with CP. Patient on warfarin 4 mg daily PTA with therapeutic INR on admission. Pt also currently ordered levofloxacin.   10/3  INR 2.36 - warfarin 3 mg 10/4  INR 2.55 - warfarin 3 mg 10/5  INR 2.56 - warfarin 3 mg 10/6  INR 2.88  Goal of Therapy:  INR 2-3   Plan:  INR increasing and patient started on azithromycin. Also may require amiodarone if unable to control heart rate with digoxin and metoprolol. Will decrease Coumadin dosing to 1 mg daily and f/u AM INR.   Ulice Dash D 03/27/2016,3:58 PM

## 2016-03-27 NOTE — Plan of Care (Signed)
After digoxin hr went down into the 110s and then overtime went back up to 150s. Contacted MD and got an order for metoprolol 5mg  IVP and it was administered  HR went down into the 70s and holding  Will cont to monitor pt.  Dr Ubaldo Glassing stated he will come by to see pt

## 2016-03-27 NOTE — Progress Notes (Signed)
PULMONARY / CRITICAL CARE MEDICINE   Name: Laura Gonzalez MRN: [id number] DOB: Sep 11, 1940    ADMISSION DATE:  03/24/2016  HISTORY OF PRESENT ILLNESS:   34 F with hx of CAF and prior pneumonias. She developed B otalgia on the evening prior to admission with pain descending into her neck. She then around MN last night with substernal sharp chest pain radiating through to her back and later developed mild dyspnea. This morning, she noted tachycardia and called EMS to come to ED for further eval where she reported this CP as being similar to that which she experienced with prior pneumonias. She also notes NP cough. When EMS initially arrived, she was hypertensive. In the ED, there was concern for an acute coronary syndrome and she was given metoprolol with subsequent hypotension. She has subsequently received 2 liters NS with persistent hypotension and has been started on  Norepinephrine at very low dose. She underwent CTA chest with results as below - no PE noted. She has been seen by Cardiology Nehemiah Massed). It is felt that her CP does not represent an acute coronary syndrome. At the time of this evaluation, she feels better but CP persists. Her serum lactate is mildly elevated    SUBJECTIVE Transferred back to ICU/SD for acute resp failure from acute CHF exacerbation Remains on  BiPAP, fio2 at 60%, critically ill High risk for intubation  Review of Systems:  ROS unabtainable due to critical illness  10/3 admission 10/6 transferred back to ICU for biPAP  VITAL SIGNS: BP 111/60   Pulse (!) 108   Temp 97.8 F (36.6 C) (Axillary)   Resp 15   Ht 5' 5.5" (1.664 m)   Wt 260 lb (117.9 kg)   SpO2 91%   BMI 42.61 kg/m      VENTILATOR SETTINGS: FiO2 (%):  [60 %] 60 %  INTAKE / OUTPUT: I/O last 3 completed shifts: In: 1054.6 [P.O.:240; I.V.:289.6; [id number]; IV [id number] Out: 2155 [Urine:2155]  PHYSICAL EXAMINATION: General: Obese, RASS 0, +resp distress Neuro: CNs intact, motor  intact, DTRs symmetric HEENT: NCAT, mucus membranes moist Cardiovascular: IRIR, rate controlled, no M noted Lungs: distant BS, few basilar crackles on R posteriorly, no wheezes Abdomen: Obese, soft, NT, +BS Ext: warm,+ edema Skin: no lesions noted  LABS:  BMET  Recent Labs Lab 03/24/16 1150 03/25/16 0330 03/27/16 0342  NA 139 138 139  K 4.4 3.7 4.3  CL 104 105 105  CO2 25 26 28   BUN 19 14 28*  CREATININE 0.96 0.83 0.84  GLUCOSE 149* 128* 183*    Electrolytes  Recent Labs Lab 03/24/16 1150 03/25/16 0330 03/27/16 0342  CALCIUM 9.9 8.6* 9.8    CBC  Recent Labs Lab 03/24/16 1150 03/25/16 0330 03/27/16 0342  WBC 18.3* 15.6* 16.2*  HGB 15.5 14.2 13.8  HCT 46.2 41.8 40.9  PLT 218 185 189    Coag's  Recent Labs Lab 03/25/16 0330 03/26/16 0427 03/27/16 0342  INR 2.55 2.56 2.88    Sepsis Markers  Recent Labs Lab 03/24/16 1150 03/24/16 1205 03/25/16 0330 03/26/16 0427  LATICACIDVEN  --  2.8*  --   --   PROCALCITON <0.10  --  0.13 1.26    ABG No results for input(s): PHART, PCO2ART, PO2ART in the last 168 hours.  Liver Enzymes  Recent Labs Lab 03/24/16 1150  AST 34  ALT 28  ALKPHOS 71  BILITOT 0.8  ALBUMIN 3.8    Cardiac Enzymes  Recent Labs Lab 03/24/16 1150  TROPONINI <0.03  Glucose  Recent Labs Lab 03/24/16 1649  GLUCAP 117*    Imaging Dg Chest Port 1 View  Result Date: 03/27/2016 CLINICAL DATA:  Acute respiratory failure.  Followup exam. EXAM: PORTABLE CHEST 1 VIEW COMPARISON:  03/26/2016 FINDINGS: Lung volumes are low. There is opacity in both lung bases consistent with combination of atelectasis and pleural fluid. Pneumonia/infection is possible. No convincing pulmonary edema. No pneumothorax. Cardiac silhouette is partly obscured but grossly normal in size. No mediastinal or hilar masses. IMPRESSION: 1. Persistent lung base opacity likely due to a combination of pleural fluid and atelectasis. Pneumonia should be  considered if there are consistent clinical findings. 2. No evidence of pulmonary edema. 3. Lung aeration appears mildly improved from the previous day's study. Electronically Signed   By: Lajean Manes M.D.   On: 03/27/2016 07:37   Dg Chest Port 1 View  Result Date: 03/26/2016 CLINICAL DATA:  Acute respiratory failure. EXAM: PORTABLE CHEST 1 VIEW COMPARISON:  Same day. FINDINGS: Hypoinflation of the lungs is noted. Stable cardiomegaly. No pneumothorax is noted. Stable mild left pleural effusion is noted. Stable bibasilar opacities are noted concerning for edema or atelectasis. Bony thorax is unremarkable. IMPRESSION: Bibasilar edema or atelectasis is noted. Stable mild left pleural effusion is noted. Electronically Signed   By: Marijo Conception, M.D.   On: 03/26/2016 14:33     ASSESSMENT / PLAN: CARDIOVASCULAR- A:  Acute CHF exacerbation from aryythmias Patient still having chest pain - chest pain without ischemic changes on EKG P:  Low dose PRN metoprolol to maintain HR < 115/min Cycle cardiac markers Echocardiogram ordered to assess LV function  Follow up Cardiology consult   PULMONARY Acute resp failure from pulm edema Oxygen and biPAP as needed High risk for intubation    RENAL A:   Mild AKI P:   Monitor BMET intermittently Monitor I/Os Correct electrolytes as indicated Lasix as tolerated  GASTROINTESTINAL Npo for now  HEMATOLOGIC A:   No issues P:  DVT px: warfarin (chronic) Monitor CBC intermittently Transfuse per usual guidelines   ENDOCRINE A:   Mild hyperglycemia without prior history of DM P:   Monitor glu on chem panels Consider SSI for glu > 180  NEUROLOGIC A:   Pain P:   RASS goal: 0 PRN acetaminophen PRN tramadol   I have personally obtained a history, examined the patient, evaluated Pertinent laboratory and [employer]/imaging results, and  formulated the assessment and plan   The Patient requires high complexity decision making for  assessment and support, frequent evaluation and titration of therapies, application of advanced monitoring technologies and extensive interpretation of multiple databases. Critical Care Time devoted to patient care services described in this note is 45 minutes.   Overall, patient is critically ill, prognosis is guarded.  Patient with Multiorgan failure and at high risk for cardiac arrest and death.    Corrin Parker, M.D.  [facility]  Medical Director [place] Director [facility] Cardio-Pulmonary Department

## 2016-03-27 NOTE — Progress Notes (Signed)
Patient ID: Laura Gonzalez, female   DOB: 1941-06-13, 75 y.o.   MRN: [id number]   [facility] PROGRESS NOTE  Laura Gonzalez [id number] DOB: 06-15-1941 DOA: 03/24/2016 PCP: [name], MD  HPI/Subjective: Patient seen a little bit earlier. Was on 6 L at that time. Nurse trying to reach cardiology about fast heart rate. Patient breathing better than yesterday.  Objective: Vitals:   03/27/16 1400 03/27/16 1415  BP: 114/62 105/66  Pulse: (!) 118 (!) 147  Resp: (!) 26 (!) 25  Temp:      Filed Weights   03/24/16 1138  Weight: 117.9 kg (260 lb)    ROS: Review of Systems  Constitutional: Negative for chills and fever.  Eyes: Negative for blurred vision.  Respiratory: Positive for cough, shortness of breath and wheezing.   Cardiovascular: Positive for palpitations. Negative for chest pain.  Gastrointestinal: Negative for abdominal pain, constipation, diarrhea, nausea and vomiting.  Genitourinary: Negative for dysuria.  Musculoskeletal: Positive for joint pain.  Neurological: Negative for dizziness and headaches.   Exam: Physical Exam  Constitutional: She is oriented to person, place, and time.  HENT:  Nose: No mucosal edema.  Mouth/Throat: No oropharyngeal exudate or posterior oropharyngeal edema.  Eyes: Conjunctivae, EOM and lids are normal. Pupils are equal, round, and reactive to light.  Neck: No JVD present. Carotid bruit is not present. No edema present. No thyroid mass and no thyromegaly present.  Cardiovascular: S1 normal and S2 normal.  An irregularly irregular rhythm present. Tachycardia present.  Exam reveals no gallop.   Murmur heard.  Systolic murmur is present with a grade of 2/6  Pulses:      Dorsalis pedis pulses are 2+ on the right side, and 2+ on the left side.  Respiratory: No accessory muscle usage. No respiratory distress. She has decreased breath sounds in the right lower field and the left lower field. She has wheezes in the right lower field  and the left middle field. She has no rhonchi. She has no rales.  GI: Soft. Bowel sounds are normal. There is no tenderness.  Musculoskeletal:       Right ankle: She exhibits swelling.       Left ankle: She exhibits swelling.  Lymphadenopathy:    She has no cervical adenopathy.  Neurological: She is alert and oriented to person, place, and time. No cranial nerve deficit.  Skin: Skin is warm. No rash noted. Nails show no clubbing.  Psychiatric: She has a normal mood and affect.      Data Reviewed: Basic Metabolic Panel:  Recent Labs Lab 03/24/16 1150 03/25/16 0330 03/27/16 0342  NA 139 138 139  K 4.4 3.7 4.3  CL 104 105 105  CO2 25 26 28   GLUCOSE 149* 128* 183*  BUN 19 14 28*  CREATININE 0.96 0.83 0.84  CALCIUM 9.9 8.6* 9.8   Liver Function Tests:  Recent Labs Lab 03/24/16 1150  AST 34  ALT 28  ALKPHOS 71  BILITOT 0.8  PROT 7.5  ALBUMIN 3.8    Recent Labs Lab 03/24/16 1150  LIPASE 20  AMYLASE 36   CBC:  Recent Labs Lab 03/24/16 1150 03/25/16 0330 03/27/16 0342  WBC 18.3* 15.6* 16.2*  NEUTROABS 16.2*  --   --   HGB 15.5 14.2 13.8  HCT 46.2 41.8 40.9  MCV 94.5 93.9 94.8  PLT 218 185 189   Cardiac Enzymes:  Recent Labs Lab 03/24/16 1150  TROPONINI <0.03    CBG:  Recent Labs  Lab 03/24/16 1649  GLUCAP 117*    Recent Results (from the past 240 hour(s))  Blood culture (routine x 2)     Status: None (Preliminary result)   Collection Time: 03/24/16 11:50 AM  Result Value Ref Range Status   Specimen Description BLOOD LEFT AC  Final   Special Requests BOTTLES DRAWN AEROBIC AND ANAEROBIC 4ML  Final   Culture NO GROWTH 3 DAYS  Final   Report Status PENDING  Incomplete  Blood culture (routine x 2)     Status: None (Preliminary result)   Collection Time: 03/24/16 11:50 AM  Result Value Ref Range Status   Specimen Description BLOOD RIGHT HAND  Final   Special Requests   Final    BOTTLES DRAWN AEROBIC AND ANAEROBIC AER 7ML ANA 6ML   Culture  NO GROWTH 3 DAYS  Final   Report Status PENDING  Incomplete  Urine culture     Status: Abnormal   Collection Time: 03/24/16  2:09 PM  Result Value Ref Range Status   Specimen Description URINE, CLEAN CATCH  Final   Special Requests NONE  Final   Culture >=100,000 COLONIES/mL ESCHERICHIA COLI (A)  Final   Report Status 03/26/2016 FINAL  Final   Organism ID, Bacteria ESCHERICHIA COLI (A)  Final      Susceptibility   Escherichia coli - MIC*    AMPICILLIN 8 SENSITIVE Sensitive     CEFAZOLIN <=4 SENSITIVE Sensitive     CEFTRIAXONE <=1 SENSITIVE Sensitive     CIPROFLOXACIN >=4 RESISTANT Resistant     GENTAMICIN <=1 SENSITIVE Sensitive     IMIPENEM <=0.25 SENSITIVE Sensitive     NITROFURANTOIN 32 SENSITIVE Sensitive     TRIMETH/SULFA <=20 SENSITIVE Sensitive     AMPICILLIN/SULBACTAM 4 SENSITIVE Sensitive     PIP/TAZO <=4 SENSITIVE Sensitive     Extended ESBL NEGATIVE Sensitive     * >=100,000 COLONIES/mL ESCHERICHIA COLI  MRSA PCR Screening     Status: None   Collection Time: 03/24/16  5:20 PM  Result Value Ref Range Status   MRSA by PCR NEGATIVE NEGATIVE Final    Comment:        The GeneXpert MRSA Assay (FDA approved for NASAL specimens only), is one component of a comprehensive MRSA colonization surveillance program. It is not intended to diagnose MRSA infection nor to guide or monitor treatment for MRSA infections.      Studies: Dg Chest Port 1 View  Result Date: 03/27/2016 CLINICAL DATA:  Acute respiratory failure.  Followup exam. EXAM: PORTABLE CHEST 1 VIEW COMPARISON:  03/26/2016 FINDINGS: Lung volumes are low. There is opacity in both lung bases consistent with combination of atelectasis and pleural fluid. Pneumonia/infection is possible. No convincing pulmonary edema. No pneumothorax. Cardiac silhouette is partly obscured but grossly normal in size. No mediastinal or hilar masses. IMPRESSION: 1. Persistent lung base opacity likely due to a combination of pleural fluid and  atelectasis. Pneumonia should be considered if there are consistent clinical findings. 2. No evidence of pulmonary edema. 3. Lung aeration appears mildly improved from the previous day's study. Electronically Signed   By: Lajean Manes M.D.   On: 03/27/2016 07:37   Dg Chest Port 1 View  Result Date: 03/26/2016 CLINICAL DATA:  Acute respiratory failure. EXAM: PORTABLE CHEST 1 VIEW COMPARISON:  Same day. FINDINGS: Hypoinflation of the lungs is noted. Stable cardiomegaly. No pneumothorax is noted. Stable mild left pleural effusion is noted. Stable bibasilar opacities are noted concerning for edema or atelectasis. Bony thorax is  unremarkable. IMPRESSION: Bibasilar edema or atelectasis is noted. Stable mild left pleural effusion is noted. Electronically Signed   By: Marijo Conception, M.D.   On: 03/26/2016 14:33   Dg Chest Port 1 View  Result Date: 03/26/2016 CLINICAL DATA:  Dyspnea EXAM: PORTABLE CHEST 1 VIEW COMPARISON:  03/25/2016 FINDINGS: Cardiac enlargement. Small bilateral pleural effusions. Atelectasis in the lung bases. No pulmonary vascular congestion or edema. No pneumothorax. No significant changes since previous study. IMPRESSION: Shallow inspiration with small bilateral pleural effusions and atelectasis in the lung bases similar to previous study. Electronically Signed   By: Lucienne Capers M.D.   On: 03/26/2016 06:15    Scheduled Meds: . aspirin EC  325 mg Oral Daily  . azithromycin  500 mg Intravenous Daily  . calcium citrate-vitamin D  1 tablet Oral BID  . cefTRIAXone (ROCEPHIN)  IV  1 g Intravenous Daily  . digoxin  0.5 mg Intravenous Once  . diltiazem  300 mg Oral Daily  . ezetimibe  10 mg Oral Daily  . feeding supplement (ENSURE ENLIVE)  237 mL Oral BID BM  . fluticasone  1 spray Each Nare Daily  . hydrochlorothiazide  25 mg Oral Daily  . ipratropium-albuterol  3 mL Nebulization Q4H  . loratadine  10 mg Oral Daily  . magnesium oxide  400 mg Oral Daily  . methylPREDNISolone  (SOLU-MEDROL) injection  60 mg Intravenous Q6H  . metoprolol tartrate  12.5 mg Oral BID  . montelukast  10 mg Oral QHS  . omega-3 acid ethyl esters  1 g Oral BID  . potassium chloride  20 mEq Oral TID  . vitamin B-12  1,000 mcg Oral Daily  . warfarin  3 mg Oral q1800  . Warfarin - Pharmacist Dosing Inpatient   Does not apply q1800   Continuous Infusions: . diltiazem (CARDIZEM) infusion 5 mg/hr (03/27/16 1200)    Assessment/Plan:  1. Acute respiratory failure with hypoxia. Patient on 6 L nasal cannula with saturations of 88%. I asked the nursing staff to try high flow nasal cannula. 2. Asthma exacerbation. Continue Solu-Medrol 60 mg every 6 hours. Nebulizer treatments. Added budesonide nebulizers. 3. Clinical sepsis upon presentation with hypotension and likely bronchitis. Patient switched to Rocephin and Zithromax 4. Rapid atrial fibrillation with relative hypotension. Patient on Cardizem drip, oral Cardizem, oral metoprolol. Heart rate currently 140s. I will give IV digoxin now. Trying to reach Dr. Nehemiah Massed cardiology. 5. Morbid obesity weight loss needed 6. History of sleep apnea. Bipap qhs 7. Hyperlipidemia unspecified on Zetia   Code Status:     Code Status Orders        Start     Ordered   03/24/16 1507  Full code  Continuous     03/24/16 1511    Code Status History    Date Active Date Inactive Code Status Order ID Comments User Context   12/12/2014  3:01 AM 12/20/2014  7:58 PM Full Code [id number]  Lance Coon, MD Inpatient     Disposition Plan: to be determined  Consultants:  cardiology  Critical care specialist  Antibiotics: - Rocephin and Zithromax  Time spent: 35 minutes. Continue ICU monitoring for heart rate and respiratory status.  Loletha Grayer  [employer]

## 2016-03-27 NOTE — Plan of Care (Signed)
Administered 0.5mg  of digoxin awaiting improvement  HR 138

## 2016-03-27 NOTE — Progress Notes (Signed)
Rate imporved after iv digoxin dose and sbp improved with rate control. Would continue to titrate iv dilt for now and use iv dig for hemodynamically significant breakthroughs. Will follow with you

## 2016-03-28 ENCOUNTER — Inpatient Hospital Stay: Payer: Medicare Other

## 2016-03-28 DIAGNOSIS — I950 Idiopathic hypotension: Secondary | ICD-10-CM

## 2016-03-28 LAB — BASIC METABOLIC PANEL
Anion gap: 7 (ref 5–15)
BUN: 43 mg/dL — AB (ref 6–20)
CO2: 28 mmol/L (ref 22–32)
Calcium: 9.8 mg/dL (ref 8.9–10.3)
Chloride: 105 mmol/L (ref 101–111)
Creatinine, Ser: 0.79 mg/dL (ref 0.44–1.00)
GFR calc Af Amer: >60 mL/min (ref >60)
GLUCOSE: 203 mg/dL — AB (ref 65–99)
POTASSIUM: 4.8 mmol/L (ref 3.5–5.1)
Sodium: 140 mmol/L (ref 135–145)

## 2016-03-28 LAB — GLUCOSE, CAPILLARY
GLUCOSE-CAPILLARY: 281 mg/dL — AB (ref 65–99)
GLUCOSE-CAPILLARY: 199 mg/dL — AB (ref 65–99)
Glucose-Capillary: 202 mg/dL — ABNORMAL HIGH (ref 65–99)
Glucose-Capillary: 249 mg/dL — ABNORMAL HIGH (ref 65–99)
Glucose-Capillary: 274 mg/dL — ABNORMAL HIGH (ref 65–99)
Glucose-Capillary: 310 mg/dL — ABNORMAL HIGH (ref 65–99)
Glucose-Capillary: 324 mg/dL — ABNORMAL HIGH (ref 65–99)

## 2016-03-28 LAB — CBC
HCT: 39.4 % (ref 35.0–47.0)
Hemoglobin: 13.2 g/dL (ref 12.0–16.0)
MCH: 31.9 pg (ref 26.0–34.0)
MCHC: 33.5 g/dL (ref 32.0–36.0)
MCV: 95.1 fL (ref 80.0–100.0)
PLATELETS: 218 10*3/uL (ref 150–440)
RBC: 4.14 MIL/uL (ref 3.80–5.20)
RDW: 14.2 % (ref 11.5–14.5)
WBC: 14.3 10*3/uL — ABNORMAL HIGH (ref 3.6–11.0)

## 2016-03-28 LAB — PROTIME-INR
INR: 2.86
Prothrombin Time: 30.6 seconds — ABNORMAL HIGH (ref 11.4–15.2)

## 2016-03-28 LAB — PHOSPHORUS: PHOSPHORUS: 2.2 mg/dL — AB (ref 2.5–4.6)

## 2016-03-28 LAB — MAGNESIUM: Magnesium: 2.1 mg/dL (ref 1.7–2.4)

## 2016-03-28 MED ORDER — DIGOXIN 0.25 MG/ML IJ SOLN
0.1250 mg | Freq: Every day | INTRAMUSCULAR | Status: DC
  Administered 2016-03-28: 0.125 mg via INTRAVENOUS
  Filled 2016-03-28: qty 2

## 2016-03-28 MED ORDER — POLYETHYLENE GLYCOL 3350 17 G PO PACK
17.0000 g | PACK | Freq: Every day | ORAL | Status: AC
  Administered 2016-03-28 – 2016-03-29 (×2): 17 g via ORAL
  Filled 2016-03-28 (×2): qty 1

## 2016-03-28 MED ORDER — METHYLPREDNISOLONE SODIUM SUCC 125 MG IJ SOLR
60.0000 mg | Freq: Two times a day (BID) | INTRAMUSCULAR | Status: DC
  Administered 2016-03-28: 60 mg via INTRAVENOUS
  Administered 2016-03-29: 125 mg via INTRAVENOUS
  Administered 2016-03-29: 60 mg via INTRAVENOUS
  Filled 2016-03-28 (×3): qty 2

## 2016-03-28 MED ORDER — INSULIN ASPART 100 UNIT/ML ~~LOC~~ SOLN
0.0000 [IU] | Freq: Three times a day (TID) | SUBCUTANEOUS | Status: DC
  Administered 2016-03-28: 5 [IU] via SUBCUTANEOUS
  Administered 2016-03-28: 3 [IU] via SUBCUTANEOUS
  Administered 2016-03-28: 7 [IU] via SUBCUTANEOUS
  Administered 2016-03-29 (×2): 2 [IU] via SUBCUTANEOUS
  Administered 2016-03-29: 7 [IU] via SUBCUTANEOUS
  Administered 2016-03-30: 3 [IU] via SUBCUTANEOUS
  Administered 2016-03-30 (×2): 2 [IU] via SUBCUTANEOUS
  Administered 2016-03-31: 1 [IU] via SUBCUTANEOUS
  Administered 2016-03-31: 3 [IU] via SUBCUTANEOUS
  Administered 2016-03-31: 5 [IU] via SUBCUTANEOUS
  Administered 2016-04-01: 1 [IU] via SUBCUTANEOUS
  Administered 2016-04-01: 2 [IU] via SUBCUTANEOUS
  Filled 2016-03-28 (×2): qty 5
  Filled 2016-03-28 (×2): qty 3
  Filled 2016-03-28 (×2): qty 1
  Filled 2016-03-28: qty 5
  Filled 2016-03-28 (×3): qty 2
  Filled 2016-03-28: qty 7
  Filled 2016-03-28: qty 2
  Filled 2016-03-28: qty 3
  Filled 2016-03-28: qty 7
  Filled 2016-03-28: qty 2

## 2016-03-28 MED ORDER — FUROSEMIDE 10 MG/ML IJ SOLN
60.0000 mg | Freq: Once | INTRAMUSCULAR | Status: AC
  Administered 2016-03-28: 60 mg via INTRAVENOUS
  Filled 2016-03-28: qty 6

## 2016-03-28 MED ORDER — INSULIN ASPART 100 UNIT/ML ~~LOC~~ SOLN
0.0000 [IU] | Freq: Every day | SUBCUTANEOUS | Status: DC
  Administered 2016-03-29 – 2016-03-30 (×2): 2 [IU] via SUBCUTANEOUS
  Filled 2016-03-28 (×2): qty 2

## 2016-03-28 NOTE — Progress Notes (Addendum)
Laura Gonzalez  SUBJECTIVE: Some sob but feels better .  Alert and oriented   Vitals:   03/28/16 1300 03/28/16 1400 03/28/16 1600 03/28/16 1603  BP: (!) 125/106 120/62 (!) 111/49   Pulse: (!) 102 95 (!) 123   Resp: (!) 27 (!) 22 (!) 32   Temp:    98.2 F (36.8 C)  TempSrc:    Oral  SpO2: 92% 90% 90%   Weight:      Height:        Intake/Output Summary (Last 24 hours) at 03/28/16 1618 Last data filed at 03/28/16 1521  Gross per 24 hour  Intake           1322.5 ml  Output             2675 ml  Net          -1352.5 ml    LABS: Basic Metabolic Panel:  Recent Labs  03/27/16 0342 03/28/16 0429  NA 139 140  K 4.3 4.8  CL 105 105  CO2 28 28  GLUCOSE 183* 203*  BUN 28* 43*  CREATININE 0.84 0.79  CALCIUM 9.8 9.8  MG  --  2.1  PHOS  --  2.2*   Liver Function Tests: No results for input(s): AST, ALT, ALKPHOS, BILITOT, PROT, ALBUMIN in the last 72 hours. No results for input(s): LIPASE, AMYLASE in the last 72 hours. CBC:  Recent Labs  03/27/16 0342 03/28/16 0429  WBC 16.2* 14.3*  HGB 13.8 13.2  HCT 40.9 39.4  MCV 94.8 95.1  PLT 189 218   Cardiac Enzymes: No results for input(s): CKTOTAL, CKMB, CKMBINDEX, TROPONINI in the last 72 hours. BNP: Invalid input(s): POCBNP D-Dimer: No results for input(s): DDIMER in the last 72 hours. Hemoglobin A1C: No results for input(s): HGBA1C in the last 72 hours. Fasting Lipid Panel: No results for input(s): CHOL, HDL, LDLCALC, TRIG, CHOLHDL, LDLDIRECT in the last 72 hours. Thyroid Function Tests: No results for input(s): TSH, T4TOTAL, T3FREE, THYROIDAB in the last 72 hours.  Invalid input(s): FREET3 Anemia Panel: No results for input(s): VITAMINB12, FOLATE, FERRITIN, TIBC, IRON, RETICCTPCT in the last 72 hours.   Physical Exam: Blood pressure (!) 111/49, pulse (!) 123, temperature 98.2 F (36.8 C), temperature source Oral, resp. rate (!) 32, height 5' 5.5" (1.664 m), weight 117.9 kg  (260 lb), SpO2 90 %.   Wt Readings from Last 1 Encounters:  03/24/16 117.9 kg (260 lb)     General appearance: alert, cooperative and high flow oxygen in place Resp: rhonchi bilaterally Cardio: irregularly irregular rhythm Extremities: extremities normal, atraumatic, no cyanosis or edema  TELEMETRY: Reviewed telemetry pt in afib with variable response:  ASSESSMENT AND PLAN:  Active Problems:   Hypotension-hemodynamically stable at present afib-has had intermitant rvr. Will continue with po cardizem and attempt to stop iv cardizem and metoprolol.  Will add daily digoxin. Conitnue careful diuresis.     Teodoro Spray., MD, [facility] 03/28/2016 4:18 PM

## 2016-03-28 NOTE — Progress Notes (Addendum)
Pt slept with bipap and tolerated, no issues.

## 2016-03-28 NOTE — Progress Notes (Signed)
[place] [facility] [place] NAME: Laura Gonzalez    MR#:  [id number]  DATE OF BIRTH:  01-May-1941  SUBJECTIVE:  CHIEF COMPLAINT:   Chief Complaint  Patient presents with  . Chest Pain   - on Bipap this morning. Starts to feel better. -Resting heart rate in 90- 100 range  REVIEW OF SYSTEMS:  Review of Systems  Constitutional: Negative for chills, fever and malaise/fatigue.  HENT: Negative for ear discharge, ear pain and nosebleeds.   Eyes: Negative for blurred vision and double vision.  Respiratory: Positive for shortness of breath. Negative for cough and wheezing.   Cardiovascular: Positive for palpitations. Negative for chest pain and leg swelling.  Gastrointestinal: Negative for abdominal pain, constipation, diarrhea, nausea and vomiting.  Genitourinary: Negative for dysuria and urgency.  Musculoskeletal: Negative for myalgias.  Neurological: Negative for dizziness, sensory change, speech change, focal weakness, seizures and headaches.  Psychiatric/Behavioral: Negative for depression.    DRUG ALLERGIES:   Allergies  Allergen Reactions  . Amoxicillin Other (See Comments)    Lips swelling, tingling  . Benzocaine-Menthol     Throat swelling  . Biafine [Wound Dressings] Swelling  . Chloraseptic Sore Throat [Acetaminophen] Other (See Comments)    throat swelling  . Fosamax [Alendronate Sodium]   . Neosporin [Neomycin-Bacitracin Zn-Polymyx] Other (See Comments)    Skin redness, puffy  . Other     "chlorotrimitron"-- rxn: throat swelling  . Oxycodone Other (See Comments)    Dizziness, lips tingling  . Statins Other (See Comments)    Muscle weakness, weak  . Tape Other (See Comments)    Skin redness    VITALS:  Blood pressure 121/60, pulse 100, temperature 97.8 F (36.6 C), temperature source Axillary, resp. rate (!) 30, height 5' 5.5" (1.664 m), weight 117.9 kg (260 lb), SpO2 94 %.  PHYSICAL EXAMINATION:  Physical  Exam  GENERAL:  75 y.o.-year-old patient lying in the bed with no acute distress.  EYES: Pupils equal, round, reactive to light and accommodation. No scleral icterus. Extraocular muscles intact.  HEENT: Head atraumatic, normocephalic. Oropharynx and nasopharynx clear.  NECK:  Supple, no jugular venous distention. No thyroid enlargement, no tenderness.  LUNGS: Breath sounds much better bilaterally, scattered expiratory wheezing, no  rales,rhonchi or crepitation. No use of accessory muscles of respiration.  CARDIOVASCULAR: S1, S2 normal. Rapid and irregular. No murmurs, rubs, or gallops.  ABDOMEN: Soft, nontender, nondistended. Bowel sounds present. No organomegaly or mass.  EXTREMITIES: No pedal edema, cyanosis, or clubbing.  NEUROLOGIC: Cranial nerves II through XII are intact. Muscle strength 5/5 in all extremities. Sensation intact. Gait not checked.  PSYCHIATRIC: The patient is alert and oriented x 3.  SKIN: No obvious rash, lesion, or ulcer.    LABORATORY PANEL:   CBC  Recent Labs Lab 03/28/16 0429  WBC 14.3*  HGB 13.2  HCT 39.4  PLT 218   ------------------------------------------------------------------------------------------------------------------  Chemistries   Recent Labs Lab 03/24/16 1150  03/28/16 0429  NA 139  < > 140  K 4.4  < > 4.8  CL 104  < > 105  CO2 25  < > 28  GLUCOSE 149*  < > 203*  BUN 19  < > 43*  CREATININE 0.96  < > 0.79  CALCIUM 9.9  < > 9.8  MG  --   --  2.1  AST 34  --   --   ALT 28  --   --   ALKPHOS 71  --   --  BILITOT 0.8  --   --   < > = values in this interval not displayed. ------------------------------------------------------------------------------------------------------------------  Cardiac Enzymes  Recent Labs Lab 03/24/16 1150  TROPONINI <0.03   ------------------------------------------------------------------------------------------------------------------  RADIOLOGY:  Dg Chest Port 1 View  Result Date:  03/28/2016 CLINICAL DATA:  Acute respiratory failure EXAM: PORTABLE CHEST 1 VIEW COMPARISON:  March 27, 2016 FINDINGS: There is patchy bibasilar atelectasis. There are small pleural effusions. Lungs elsewhere clear. Heart is mildly enlarged with pulmonary venous hypertension. No adenopathy. There is postoperative change in the lower cervical spine. IMPRESSION: Findings indicative of a degree of congestive heart failure. No airspace consolidation evident. There is bibasilar atelectasis. Appearance essentially stable compared to 1 day prior. Electronically Signed   By: Lowella Grip III M.D.   On: 03/28/2016 07:25   Dg Chest Port 1 View  Result Date: 03/27/2016 CLINICAL DATA:  Acute respiratory failure.  Followup exam. EXAM: PORTABLE CHEST 1 VIEW COMPARISON:  03/26/2016 FINDINGS: Lung volumes are low. There is opacity in both lung bases consistent with combination of atelectasis and pleural fluid. Pneumonia/infection is possible. No convincing pulmonary edema. No pneumothorax. Cardiac silhouette is partly obscured but grossly normal in size. No mediastinal or hilar masses. IMPRESSION: 1. Persistent lung base opacity likely due to a combination of pleural fluid and atelectasis. Pneumonia should be considered if there are consistent clinical findings. 2. No evidence of pulmonary edema. 3. Lung aeration appears mildly improved from the previous day's study. Electronically Signed   By: Lajean Manes M.D.   On: 03/27/2016 07:37   Dg Chest Port 1 View  Result Date: 03/26/2016 CLINICAL DATA:  Acute respiratory failure. EXAM: PORTABLE CHEST 1 VIEW COMPARISON:  Same day. FINDINGS: Hypoinflation of the lungs is noted. Stable cardiomegaly. No pneumothorax is noted. Stable mild left pleural effusion is noted. Stable bibasilar opacities are noted concerning for edema or atelectasis. Bony thorax is unremarkable. IMPRESSION: Bibasilar edema or atelectasis is noted. Stable mild left pleural effusion is noted.  Electronically Signed   By: Marijo Conception, M.D.   On: 03/26/2016 14:33    EKG:   Orders placed or performed during the hospital encounter of 03/24/16  . ED EKG  . ED EKG  . ED EKG within 10 minutes  . ED EKG within 10 minutes  . EKG 12-Lead  . EKG 12-Lead    ASSESSMENT AND PLAN:   75 year old female with past medical history significant for paroxysmal atrial fibrillation, asthma, arthritis, GERD, hypertension presents to hospital secondary to worsening shortness of breath.  #1 acute on chronic hypoxic respiratory failure-secondary to asthma exacerbation. -Remains on BiPAP with FiO2 of 60%. Clinically improving otherwise. -Wean to nasal cannula as tolerated.  -appreciate pulmonary consult. -Decrease dose of IV steroids -On empiric antibiotics with Rocephin and azithromycin. Follow-up chest x-ray tomorrow  #2 atrial fibrillation with rapid ventricular response- likely triggered by respiratory status. -Appreciate cardiology input. Responded well to IV digoxin once yesterday. -Remains on IV Cardizem drip. Also on oral Cardizem and metoprolol. -Wean IV Cardizem as tolerated. -Remains on warfarin and INR is therapeutic  #3 elevated troponin-likely demand ischemia  #4 DVT prophylaxis-on Coumadin  Monitor in stepdown today. Transfer to telemetry once respiratory status is improved   All the records are reviewed and case discussed with Care Management/Social Workerr. Management plans discussed with the patient, family and they are in agreement.  CODE STATUS: Full code  TOTAL CRITICAL CARE TIME SPENT IN TAKING CARE OF THIS PATIENT: 40 minutes.   POSSIBLE D/C  IN 2 DAYS, DEPENDING ON CLINICAL CONDITION.   Courtnee Myer M.D on 03/28/2016 at 11:53 AM  Between 7am to 6pm - Pager - [phone]  After 6pm go to [email or web address] - password [email or web address] [facility]  Office  [phone]  CC: Primary care physician; [name], MD

## 2016-03-28 NOTE — Progress Notes (Signed)
Placed patient on a 6l [place] per md request

## 2016-03-28 NOTE — Progress Notes (Signed)
Patient placed back on HFNC of 40L and 50% per her request. Sats are 90-91%

## 2016-03-28 NOTE — Progress Notes (Signed)
ANTICOAGULATION CONSULT NOTE - Follow up [place] for Warfarin Indication: atrial fibrillation  Allergies  Allergen Reactions  . Amoxicillin Other (See Comments)    Lips swelling, tingling  . Benzocaine-Menthol     Throat swelling  . Biafine [Wound Dressings] Swelling  . Chloraseptic Sore Throat [Acetaminophen] Other (See Comments)    throat swelling  . Fosamax [Alendronate Sodium]   . Neosporin [Neomycin-Bacitracin Zn-Polymyx] Other (See Comments)    Skin redness, puffy  . Other     "chlorotrimitron"-- rxn: throat swelling  . Oxycodone Other (See Comments)    Dizziness, lips tingling  . Statins Other (See Comments)    Muscle weakness, weak  . Tape Other (See Comments)    Skin redness    Patient Measurements: Height: 5' 5.5" (166.4 cm) Weight: 260 lb (117.9 kg) IBW/kg (Calculated) : 58.15  Vital Signs: Temp: 97.8 F (36.6 C) (10/07 0950) Temp Source: Axillary (10/07 0950) BP: 121/60 (10/07 0600) Pulse Rate: 100 (10/07 0600)  Labs:  Recent Labs  03/26/16 0427 03/27/16 0342 03/28/16 0429  HGB  --  13.8 13.2  HCT  --  40.9 39.4  PLT  --  189 218  LABPROT 28.0* 30.8* 30.6*  INR 2.56 2.88 2.86  CREATININE  --  0.84 0.79    Estimated Creatinine Clearance: 80 mL/min (by C-G formula based on SCr of 0.79 mg/dL).   Assessment: 75 y/o F with a h/o AFib admitted with CP. Patient on warfarin 4 mg daily PTA with therapeutic INR on admission. Pt also currently ordered levofloxacin.   10/3  INR 2.36 - warfarin 3 mg 10/4  INR 2.55 - warfarin 3 mg 10/5  INR 2.56 - warfarin 3 mg 10/6  INR 2.88 - warfarin 1 mg 10/7  INR 2.86  Goal of Therapy:  INR 2-3   Plan:  INR increasing and patient started on azithromycin. Also may require amiodarone if unable to control heart rate with digoxin and metoprolol. Will continue with Coumadin 1 mg daily and f/u AM INR.   Paulina Fusi, PharmD, BCPS 03/28/2016 10:16 AM

## 2016-03-28 NOTE — Progress Notes (Deleted)
Spoke with Dr. Marcille Blanco re transitional insulin. MD to place order.

## 2016-03-28 NOTE — Progress Notes (Signed)
PULMONARY / CRITICAL CARE MEDICINE   Name: Laura Gonzalez MRN: [id number] DOB: 26-Jul-1940    ADMISSION DATE:  03/24/2016  HISTORY OF PRESENT ILLNESS:   89 F with hx of CAF and prior pneumonias. She developed B otalgia on the evening prior to admission with pain descending into her neck. She then around [place] last night with substernal sharp chest pain radiating through to her back and later developed mild dyspnea. This morning, she noted tachycardia and called EMS to come to ED for further eval where she reported this CP as being similar to that which she experienced with prior pneumonias. She also notes NP cough. When EMS initially arrived, she was hypertensive. In the ED, there was concern for an acute coronary syndrome and she was given metoprolol with subsequent hypotension. She has subsequently received 2 liters NS with persistent hypotension and has been started on  Norepinephrine at very low dose. She underwent CTA chest with results as below - no PE noted. She has been seen by Cardiology Nehemiah Massed). It is felt that her CP does not represent an acute coronary syndrome. At the time of this evaluation, she feels better but CP persists. Her serum lactate is mildly elevated    SUBJECTIVE Transferred back to ICU/SD for acute resp failure from acute CHF exacerbation Remains on  BiPAP, fio2 at 60%, critically ill High risk for intubation Cardiology gave digoxin yesterday-HR somewhat improved  Review of Systems:  ROS unabtainable due to critical illness  10/3 admission 10/6 transferred back to ICU for biPAP  VITAL SIGNS: BP 121/60   Pulse 100   Temp 97.9 F (36.6 C) (Axillary)   Resp (!) 30   Ht 5' 5.5" (1.664 m)   Wt 260 lb (117.9 kg)   SpO2 94%   BMI 42.61 kg/m      VENTILATOR SETTINGS: FiO2 (%):  [50 %-60 %] 50 %  INTAKE / OUTPUT: I/O last 3 completed shifts: In: 2144.2 [P.O.:1560; I.V.:184.2; IV Piggyback:400] Out: 1940 [Urine:1940]  PHYSICAL EXAMINATION: General:  Obese, RASS 0, +resp distress Neuro: CNs intact, motor intact, DTRs symmetric HEENT: NCAT, mucus membranes moist Cardiovascular: IRIR, rate controlled, no M noted Lungs: distant BS, few basilar crackles on R posteriorly, no wheezes Abdomen: Obese, soft, NT, +BS Ext: warm,+ edema Skin: no lesions noted  LABS:  BMET  Recent Labs Lab 03/25/16 0330 03/27/16 0342 03/28/16 0429  NA 138 139 140  K 3.7 4.3 4.8  CL 105 105 105  CO2 26 28 28   BUN 14 28* 43*  CREATININE 0.83 0.84 0.79  GLUCOSE 128* 183* 203*    Electrolytes  Recent Labs Lab 03/25/16 0330 03/27/16 0342 03/28/16 0429  CALCIUM 8.6* 9.8 9.8  MG  --   --  2.1  PHOS  --   --  2.2*    CBC  Recent Labs Lab 03/25/16 0330 03/27/16 0342 03/28/16 0429  WBC 15.6* 16.2* 14.3*  HGB 14.2 13.8 13.2  HCT 41.8 40.9 39.4  PLT 185 189 218    Coag's  Recent Labs Lab 03/26/16 0427 03/27/16 0342 03/28/16 0429  INR 2.56 2.88 2.86    Sepsis Markers  Recent Labs Lab 03/24/16 1150 03/24/16 1205 03/25/16 0330 03/26/16 0427  LATICACIDVEN  --  2.8*  --   --   PROCALCITON <0.10  --  0.13 1.26    ABG No results for input(s): PHART, PCO2ART, PO2ART in the last 168 hours.  Liver Enzymes  Recent Labs Lab 03/24/16 1150  AST 34  ALT  28  ALKPHOS 71  BILITOT 0.8  ALBUMIN 3.8    Cardiac Enzymes  Recent Labs Lab 03/24/16 1150  TROPONINI <0.03    Glucose  Recent Labs Lab 03/24/16 1649 03/28/16 0745 03/28/16 0932  GLUCAP 117* 202* 249*    Imaging Dg Chest Port 1 View  Result Date: 03/28/2016 CLINICAL DATA:  Acute respiratory failure EXAM: PORTABLE CHEST 1 VIEW COMPARISON:  March 27, 2016 FINDINGS: There is patchy bibasilar atelectasis. There are small pleural effusions. Lungs elsewhere clear. Heart is mildly enlarged with pulmonary venous hypertension. No adenopathy. There is postoperative change in the lower cervical spine. IMPRESSION: Findings indicative of a degree of congestive heart  failure. No airspace consolidation evident. There is bibasilar atelectasis. Appearance essentially stable compared to 1 day prior. Electronically Signed   By: Lowella Grip III M.D.   On: 03/28/2016 07:25     ASSESSMENT / PLAN: CARDIOVASCULAR- A:  Acute CHF exacerbation from aryythmias Patient still having chest pain - chest pain without ischemic changes on EKG P:  Low dose PRN metoprolol to maintain HR < 115/min Cycle cardiac markers Will give lasix 60 mg x 1  PULMONARY Acute resp failure from pulm edema Oxygen and biPAP as needed High risk for intubation    RENAL A:   Mild AKI P:   Monitor BMET intermittently Monitor I/Os Correct electrolytes as indicated Lasix as tolerated  GASTROINTESTINAL Npo for now  HEMATOLOGIC A:   No issues P:  DVT px: warfarin (chronic) Monitor CBC intermittently Transfuse per usual guidelines   ENDOCRINE A:   Mild hyperglycemia without prior history of DM P:   Monitor glu on chem panels Consider SSI for glu > 180  NEUROLOGIC A:   Pain P:   RASS goal: 0 PRN acetaminophen PRN tramadol   I have personally obtained a history, examined the patient, evaluated Pertinent laboratory and RadioGraphic/imaging results, and  formulated the assessment and plan   The Patient requires high complexity decision making for assessment and support, frequent evaluation and titration of therapies, application of advanced monitoring technologies and extensive interpretation of multiple databases. Critical Care Time devoted to patient care services described in this note is 35 minutes.   Overall, patient is critically ill, prognosis is guarded.  Patient with Multiorgan failure and at high risk for cardiac arrest and death.    Corrin Parker, M.D.  [facility]  Medical Director [place] Director [facility] Cardio-Pulmonary Department

## 2016-03-29 ENCOUNTER — Inpatient Hospital Stay: Payer: Medicare Other

## 2016-03-29 DIAGNOSIS — I5031 Acute diastolic (congestive) heart failure: Secondary | ICD-10-CM

## 2016-03-29 LAB — BASIC METABOLIC PANEL
Anion gap: 8 (ref 5–15)
BUN: 38 mg/dL — AB (ref 6–20)
CHLORIDE: 102 mmol/L (ref 101–111)
CO2: 31 mmol/L (ref 22–32)
Calcium: 9.3 mg/dL (ref 8.9–10.3)
Creatinine, Ser: 0.71 mg/dL (ref 0.44–1.00)
Glucose, Bld: 199 mg/dL — ABNORMAL HIGH (ref 65–99)
POTASSIUM: 4.5 mmol/L (ref 3.5–5.1)
SODIUM: 141 mmol/L (ref 135–145)

## 2016-03-29 LAB — GLUCOSE, CAPILLARY
GLUCOSE-CAPILLARY: 320 mg/dL — AB (ref 65–99)
GLUCOSE-CAPILLARY: 196 mg/dL — AB (ref 65–99)
GLUCOSE-CAPILLARY: 208 mg/dL — AB (ref 65–99)
Glucose-Capillary: 190 mg/dL — ABNORMAL HIGH (ref 65–99)

## 2016-03-29 LAB — CULTURE, BLOOD (ROUTINE X 2)
Culture: NO GROWTH
Culture: NO GROWTH

## 2016-03-29 LAB — PROTIME-INR
INR: 2.37
Prothrombin Time: 26.3 seconds — ABNORMAL HIGH (ref 11.4–15.2)

## 2016-03-29 LAB — PHOSPHORUS: PHOSPHORUS: 3.2 mg/dL (ref 2.5–4.6)

## 2016-03-29 LAB — HEMOGLOBIN A1C
Hgb A1c MFr Bld: 5.9 % — ABNORMAL HIGH (ref 4.8–5.6)
Mean Plasma Glucose: 123 mg/dL

## 2016-03-29 LAB — MAGNESIUM: MAGNESIUM: 2.1 mg/dL (ref 1.7–2.4)

## 2016-03-29 MED ORDER — PANTOPRAZOLE SODIUM 40 MG PO TBEC
40.0000 mg | DELAYED_RELEASE_TABLET | Freq: Every day | ORAL | Status: DC
  Administered 2016-03-29 – 2016-04-01 (×4): 40 mg via ORAL
  Filled 2016-03-29 (×4): qty 1

## 2016-03-29 MED ORDER — WARFARIN SODIUM 1 MG PO TABS
2.0000 mg | ORAL_TABLET | Freq: Every day | ORAL | Status: DC
  Administered 2016-03-29: 2 mg via ORAL
  Filled 2016-03-29: qty 2

## 2016-03-29 MED ORDER — DIGOXIN 125 MCG PO TABS
0.1250 mg | ORAL_TABLET | Freq: Every day | ORAL | Status: DC
  Administered 2016-03-29 – 2016-04-01 (×4): 0.125 mg via ORAL
  Filled 2016-03-29 (×4): qty 1

## 2016-03-29 NOTE — Progress Notes (Addendum)
Report called to Laura Schwartz RN on 2A. Patient to move to room 250 after finishing breakfast. Patient aware of transfer. Foley removed before transfer (300 was in bag and charted). Patient was able to roll self and boost up hips to go on bedpan.

## 2016-03-29 NOTE — Progress Notes (Signed)
Pt slept on BIPAP this shift, was on HFNC at the beginning of the shift. Tolerated BIPAP. NP wants pt to be weaned back to HFNC this AM.

## 2016-03-29 NOTE — Progress Notes (Signed)
PULMONARY / CRITICAL CARE MEDICINE   Name: Laura Gonzalez MRN: [id number] DOB: [birth year]    ADMISSION DATE:  03/24/2016  HISTORY OF PRESENT ILLNESS:   45 F with hx of CAF and prior pneumonias. She developed B otalgia on the evening prior to admission with pain descending into her neck. She then around [place] last night with substernal sharp chest pain radiating through to her back and later developed mild dyspnea. This morning, she noted tachycardia and called EMS to come to ED for further eval where she reported this CP as being similar to that which she experienced with prior pneumonias. She also notes NP cough. When EMS initially arrived, she was hypertensive. In the ED, there was concern for an acute coronary syndrome and she was given metoprolol with subsequent hypotension. She has subsequently received 2 liters NS with persistent hypotension and has been started on  Norepinephrine at very low dose. She underwent CTA chest with results as below - no PE noted. She has been seen by Cardiology Nehemiah Massed). It is felt that her CP does not represent an acute coronary syndrome. At the time of this evaluation, she feels better but CP persists. Her serum lactate is mildly elevated    SUBJECTIVE HR improved REsp status improved Case discussed with Dr Roxan Diesel transfer to gen med floor  Review of Systems:  ROS unabtainable due to critical illness  10/3 admission 10/6 transferred back to ICU for biPAP 10/8 transfer out of ICU  VITAL SIGNS: BP (!) 91/48   Pulse 97   Temp 97.5 F (36.4 C) (Axillary)   Resp (!) 26   Ht 5' 5.5" (1.664 m)   Wt 260 lb (117.9 kg)   SpO2 94%   BMI 42.61 kg/m      VENTILATOR SETTINGS: FiO2 (%):  [50 %-60 %] 60 %  INTAKE / OUTPUT: I/O last 3 completed shifts: In: [id number] [P.O.:840; I.V.:105; IV Piggyback:300] Out: 3925 [Urine:3925]  PHYSICAL EXAMINATION: General: Obese, RASS 0, no resp distress Neuro: CNs intact, motor intact, DTRs symmetric HEENT:  NCAT, mucus membranes moist Cardiovascular: IRIR, rate controlled, no M noted Lungs: distant BS, few basilar crackles on R posteriorly, no wheezes Abdomen: Obese, soft, NT, +BS Ext: warm,+ edema Skin: no lesions noted  LABS:  BMET  Recent Labs Lab 03/27/16 0342 03/28/16 0429 03/29/16 0439  NA 139 140 141  K 4.3 4.8 4.5  CL 105 105 102  CO2 28 28 31   BUN 28* 43* 38*  CREATININE 0.84 0.79 0.71  GLUCOSE 183* 203* 199*    Electrolytes  Recent Labs Lab 03/27/16 0342 03/28/16 0429 03/29/16 0439  CALCIUM 9.8 9.8 9.3  MG  --  2.1 2.1  PHOS  --  2.2* 3.2    CBC  Recent Labs Lab 03/25/16 0330 03/27/16 0342 03/28/16 0429  WBC 15.6* 16.2* 14.3*  HGB 14.2 13.8 13.2  HCT 41.8 40.9 39.4  PLT 185 189 218    Coag's  Recent Labs Lab 03/27/16 0342 03/28/16 0429 03/29/16 0439  INR 2.88 2.86 2.37    Sepsis Markers  Recent Labs Lab 03/24/16 1150 03/24/16 1205 03/25/16 0330 03/26/16 0427  LATICACIDVEN  --  2.8*  --   --   PROCALCITON <0.10  --  0.13 1.26    ABG No results for input(s): PHART, PCO2ART, PO2ART in the last 168 hours.  Liver Enzymes  Recent Labs Lab 03/24/16 1150  AST 34  ALT 28  ALKPHOS 71  BILITOT 0.8  ALBUMIN 3.8  Cardiac Enzymes  Recent Labs Lab 03/24/16 1150  TROPONINI <0.03    Glucose  Recent Labs Lab 03/28/16 0932 03/28/16 1116 03/28/16 1623 03/28/16 1813 03/28/16 2132 03/29/16 0805  GLUCAP 249* 274* 281* 310*  324* 199* 190*    Imaging Dg Chest Port 1 View  Result Date: 03/29/2016 CLINICAL DATA:  Followup pneumonia. EXAM: PORTABLE CHEST 1 VIEW COMPARISON:  In 12/2015 FINDINGS: Persistent lower lobe collapse with associated effusions. Upper lobes remain clear. No unchanged since yesterday. IMPRESSION: Unchanged since yesterday. Persistent pleural effusions. Persistent lower lobe collapse and could be simple volume loss with or without pneumonia. Electronically Signed   By: Nelson Chimes M.D.   On: 03/29/2016  07:40     ASSESSMENT / PLAN: CARDIOVASCULAR- A:  Acute CHF exacerbation from aryythmias Patient still having chest pain - chest pain without ischemic changes on EKG P:  Low dose PRN metoprolol to maintain HR < 115/min  PULMONARY Acute resp failure from pulm edema Oxygen and biPAP as needed  RENAL A:   Mild AKI P:   Monitor BMET intermittently Monitor I/Os Correct electrolytes as indicated Lasix as tolerated   HEMATOLOGIC A:   No issues P:  DVT px: warfarin (chronic) Monitor CBC intermittently Transfuse per usual guidelines   ENDOCRINE A:   Mild hyperglycemia without prior history of DM P:   Monitor glu on chem panels Consider SSI for glu > 180   I have personally obtained a history, examined the patient, evaluated Pertinent laboratory and RadioGraphic/imaging results, and  formulated the assessment and plan   The Patient requires high complexity decision making for assessment and support, frequent evaluation and titration of therapies, application of advanced monitoring technologies and extensive interpretation of multiple databases.  Corrin Parker, M.D.  [facility]  Medical Director [place] Director [facility] Cardio-Pulmonary Department

## 2016-03-29 NOTE — Care Management Important Message (Signed)
Important Message  Patient Details  Name: Laura Gonzalez MRN: [id number] Date of Birth: 09/18/1940   Medicare Important Message Given:  Yes    Ramar Nobrega A, RN 03/29/2016, 2:39 PM

## 2016-03-29 NOTE — Progress Notes (Signed)
Placed patient on 6l [place] from bipap, pt tol well at this time.

## 2016-03-29 NOTE — Progress Notes (Signed)
ANTICOAGULATION CONSULT NOTE - Follow up [place] for Warfarin Indication: atrial fibrillation  Allergies  Allergen Reactions  . Amoxicillin Other (See Comments)    Lips swelling, tingling  . Benzocaine-Menthol     Throat swelling  . Biafine [Wound Dressings] Swelling  . Chloraseptic Sore Throat [Acetaminophen] Other (See Comments)    throat swelling  . Fosamax [Alendronate Sodium]   . Neosporin [Neomycin-Bacitracin Zn-Polymyx] Other (See Comments)    Skin redness, puffy  . Other     "chlorotrimitron"-- rxn: throat swelling  . Oxycodone Other (See Comments)    Dizziness, lips tingling  . Statins Other (See Comments)    Muscle weakness, weak  . Tape Other (See Comments)    Skin redness    Patient Measurements: Height: 5\' 5"  (165.1 cm) Weight: 268 lb 3.2 oz (121.7 kg) IBW/kg (Calculated) : 57  Vital Signs: Temp: 97.7 F (36.5 C) (10/08 1120) Temp Source: Oral (10/08 1120) BP: 149/71 (10/08 0952) Pulse Rate: 107 (10/08 1120)  Labs:  Recent Labs  03/27/16 0342 03/28/16 0429 03/29/16 0439  HGB 13.8 13.2  --   HCT 40.9 39.4  --   PLT 189 218  --   LABPROT 30.8* 30.6* 26.3*  INR 2.88 2.86 2.37  CREATININE 0.84 0.79 0.71    Estimated Creatinine Clearance: 80.7 mL/min (by C-G formula based on SCr of 0.71 mg/dL).   Assessment: 75 y/o F with a h/o AFib admitted with CP. Patient on warfarin 4 mg daily PTA with therapeutic INR on admission. Pt was receiving levofloxacin, now receiving Ceftriaxone and Azithromycin.  10/3  INR 2.36 - warfarin 3 mg 10/4  INR 2.55 - warfarin 3 mg 10/5  INR 2.56 - warfarin 3 mg 10/6  INR 2.88 - warfarin 1 mg 10/7  INR 2.86 - Warfarin 1mg   10/8  INR 2.37-   Goal of Therapy:  INR 2-3   Plan:  INR decreasing now.  Patient may require amiodarone if unable to control heart rate with digoxin and metoprolol. Will give with Coumadin 2 mg tonight and f/u AM INR since INR trended down.   Nancy Fetter, PharmD Clinical  Pharmacist 03/29/2016 3:24 PM

## 2016-03-29 NOTE — Progress Notes (Signed)
[place] [facility] [place] NAME: Laura Gonzalez    MR#:  [id number]  DATE OF BIRTH:  [birth year]  SUBJECTIVE:  CHIEF COMPLAINT:   Chief Complaint  Patient presents with  . Chest Pain   - on Bipap again starting last night. Was on high flow nasal cannula last evening. - HR better, off cardizem drip. Foley placed yesterday for lasix.  REVIEW OF SYSTEMS:  Review of Systems  Constitutional: Negative for chills, fever and malaise/fatigue.  HENT: Negative for ear discharge, ear pain and nosebleeds.   Eyes: Negative for blurred vision and double vision.  Respiratory: Positive for shortness of breath. Negative for cough and wheezing.   Cardiovascular: Negative for chest pain, palpitations and leg swelling.  Gastrointestinal: Negative for abdominal pain, constipation, diarrhea, nausea and vomiting.  Genitourinary: Negative for dysuria and urgency.  Musculoskeletal: Positive for joint pain. Negative for myalgias.       Both knee pain from arthritis  Neurological: Negative for dizziness, sensory change, speech change, focal weakness, seizures and headaches.  Psychiatric/Behavioral: Negative for depression.    DRUG ALLERGIES:   Allergies  Allergen Reactions  . Amoxicillin Other (See Comments)    Lips swelling, tingling  . Benzocaine-Menthol     Throat swelling  . Biafine [Wound Dressings] Swelling  . Chloraseptic Sore Throat [Acetaminophen] Other (See Comments)    throat swelling  . Fosamax [Alendronate Sodium]   . Neosporin [Neomycin-Bacitracin Zn-Polymyx] Other (See Comments)    Skin redness, puffy  . Other     "chlorotrimitron"-- rxn: throat swelling  . Oxycodone Other (See Comments)    Dizziness, lips tingling  . Statins Other (See Comments)    Muscle weakness, weak  . Tape Other (See Comments)    Skin redness    VITALS:  Blood pressure (!) 91/48, pulse 97, temperature 97.5 F (36.4 C), temperature source Axillary, resp. rate (!)  26, height 5' 5.5" (1.664 m), weight 117.9 kg (260 lb), SpO2 94 %.  PHYSICAL EXAMINATION:  Physical Exam  GENERAL:  75 y.o.-year-old patient lying in the bed with no acute distress. On Bipap EYES: Pupils equal, round, reactive to light and accommodation. No scleral icterus. Extraocular muscles intact.  HEENT: Head atraumatic, normocephalic. Oropharynx and nasopharynx clear.  NECK:  Supple, no jugular venous distention. No thyroid enlargement, no tenderness.  LUNGS: Breath sounds much better bilaterally, improved expiratory wheezing, no  rales,rhonchi or crepitation. No use of accessory muscles of respiration. Decreased at the bases. CARDIOVASCULAR: S1, S2 normal. irregular. No murmurs, rubs, or gallops.  ABDOMEN: Soft, nontender, nondistended. Bowel sounds present. No organomegaly or mass.  EXTREMITIES: No pedal edema, cyanosis, or clubbing.  NEUROLOGIC: Cranial nerves II through XII are intact. Muscle strength 5/5 in all extremities. Sensation intact. Gait not checked.  PSYCHIATRIC: The patient is alert and oriented x 3.  SKIN: No obvious rash, lesion, or ulcer.    LABORATORY PANEL:   CBC  Recent Labs Lab 03/28/16 0429  WBC 14.3*  HGB 13.2  HCT 39.4  PLT 218   ------------------------------------------------------------------------------------------------------------------  Chemistries   Recent Labs Lab 03/24/16 1150  03/29/16 0439  NA 139  < > 141  K 4.4  < > 4.5  CL 104  < > 102  CO2 25  < > 31  GLUCOSE 149*  < > 199*  BUN 19  < > 38*  CREATININE 0.96  < > 0.71  CALCIUM 9.9  < > 9.3  MG  --   < >  2.1  AST 34  --   --   ALT 28  --   --   ALKPHOS 71  --   --   BILITOT 0.8  --   --   < > = values in this interval not displayed. ------------------------------------------------------------------------------------------------------------------  Cardiac Enzymes  Recent Labs Lab 03/24/16 1150  TROPONINI <0.03    ------------------------------------------------------------------------------------------------------------------  RADIOLOGY:  Dg Chest Port 1 View  Result Date: 03/28/2016 CLINICAL DATA:  Acute respiratory failure EXAM: PORTABLE CHEST 1 VIEW COMPARISON:  March 27, 2016 FINDINGS: There is patchy bibasilar atelectasis. There are small pleural effusions. Lungs elsewhere clear. Heart is mildly enlarged with pulmonary venous hypertension. No adenopathy. There is postoperative change in the lower cervical spine. IMPRESSION: Findings indicative of a degree of congestive heart failure. No airspace consolidation evident. There is bibasilar atelectasis. Appearance essentially stable compared to 1 day prior. Electronically Signed   By: Lowella Grip III M.D.   On: 03/28/2016 07:25    EKG:   Orders placed or performed during the hospital encounter of 03/24/16  . ED EKG  . ED EKG  . ED EKG within 10 minutes  . ED EKG within 10 minutes  . EKG 12-Lead  . EKG 12-Lead    ASSESSMENT AND PLAN:   75 year old female with past medical history significant for paroxysmal atrial fibrillation, asthma, arthritis, GERD, hypertension presents to hospital secondary to worsening shortness of breath.  #1 acute on chronic hypoxic respiratory failure-secondary to asthma exacerbation and pneumonia/bronchitis. -Remains on BiPAP at nights and high flow during days with FiO2 of 60%.  -Wean to nasal cannula as tolerated. Started incentive spirometry - encourage to sit in a chair today -appreciate pulmonary consult. -BID IV steroids -On empiric antibiotics with Rocephin and azithromycin. Follow-up chest x-ray today  #2 atrial fibrillation with rapid ventricular response- likely triggered by respiratory status. -Appreciate cardiology input. Off cardizem drip - started daily digoxin- change to PO -Also on oral Cardizem and metoprolol. -Remains on warfarin and INR is therapeutic  #3 elevated troponin-likely  demand ischemia  #4 DVT prophylaxis-on Coumadin   Monitor in stepdown. Transfer to telemetry once respiratory status is improved   All the records are reviewed and case discussed with Care Management/Social Workerr. Management plans discussed with the patient, family and they are in agreement.  CODE STATUS: Full code  TOTAL TIME SPENT IN TAKING CARE OF THIS PATIENT: 38 minutes.   POSSIBLE D/C IN 2 DAYS, DEPENDING ON CLINICAL CONDITION.   Gladstone Lighter M.D on 03/29/2016 at 7:23 AM  Between 7am to 6pm - Pager - [phone]  After 6pm go to [email or web address] - password [email or web address] [facility]  Office  [phone]  CC: Primary care physician; [name], MD

## 2016-03-30 LAB — GLUCOSE, CAPILLARY
GLUCOSE-CAPILLARY: 196 mg/dL — AB (ref 65–99)
Glucose-Capillary: 236 mg/dL — ABNORMAL HIGH (ref 65–99)
Glucose-Capillary: 184 mg/dL — ABNORMAL HIGH (ref 65–99)
Glucose-Capillary: 238 mg/dL — ABNORMAL HIGH (ref 65–99)

## 2016-03-30 LAB — PROTIME-INR
INR: 2.07
PROTHROMBIN TIME: 23.6 s — AB (ref 11.4–15.2)

## 2016-03-30 MED ORDER — METHYLPREDNISOLONE SODIUM SUCC 40 MG IJ SOLR
20.0000 mg | Freq: Two times a day (BID) | INTRAMUSCULAR | Status: DC
  Administered 2016-03-30 – 2016-04-01 (×6): 20 mg via INTRAVENOUS
  Filled 2016-03-30 (×5): qty 1

## 2016-03-30 MED ORDER — IPRATROPIUM-ALBUTEROL 0.5-2.5 (3) MG/3ML IN SOLN
3.0000 mL | Freq: Four times a day (QID) | RESPIRATORY_TRACT | Status: DC
  Administered 2016-03-31 – 2016-04-01 (×6): 3 mL via RESPIRATORY_TRACT
  Filled 2016-03-30 (×6): qty 3

## 2016-03-30 MED ORDER — WARFARIN SODIUM 4 MG PO TABS
4.0000 mg | ORAL_TABLET | Freq: Every day | ORAL | Status: DC
  Administered 2016-03-30 – 2016-03-31 (×2): 4 mg via ORAL
  Filled 2016-03-30 (×2): qty 1

## 2016-03-30 MED ORDER — AZITHROMYCIN 250 MG PO TABS
500.0000 mg | ORAL_TABLET | Freq: Every day | ORAL | Status: AC
  Administered 2016-03-30: 500 mg via ORAL
  Filled 2016-03-30: qty 2

## 2016-03-30 NOTE — Progress Notes (Signed)
[facility] [place] NAME: Laura Gonzalez    MR#:  [id number]  DATE OF BIRTH:  [birth year]  SUBJECTIVE:  CHIEF COMPLAINT:   Chief Complaint  Patient presents with  . Chest Pain   - slept with her CPAP last night, HR in 80-90 range today - still feels short of breath, on 5L o2 during daytime.  REVIEW OF SYSTEMS:  Review of Systems  Constitutional: Negative for chills, fever and malaise/fatigue.  HENT: Negative for ear discharge, ear pain and nosebleeds.   Eyes: Negative for blurred vision and double vision.  Respiratory: Positive for shortness of breath. Negative for cough and wheezing.   Cardiovascular: Negative for chest pain, palpitations and leg swelling.  Gastrointestinal: Negative for abdominal pain, constipation, diarrhea, nausea and vomiting.  Genitourinary: Negative for dysuria and urgency.  Musculoskeletal: Positive for joint pain. Negative for myalgias.       Both knee pain from arthritis  Neurological: Negative for dizziness, sensory change, speech change, focal weakness, seizures and headaches.  Psychiatric/Behavioral: Negative for depression.    DRUG ALLERGIES:   Allergies  Allergen Reactions  . Amoxicillin Other (See Comments)    Lips swelling, tingling  . Benzocaine-Menthol     Throat swelling  . Biafine [Wound Dressings] Swelling  . Chloraseptic Sore Throat [Acetaminophen] Other (See Comments)    throat swelling  . Fosamax [Alendronate Sodium]   . Neosporin [Neomycin-Bacitracin Zn-Polymyx] Other (See Comments)    Skin redness, puffy  . Other     "chlorotrimitron"-- rxn: throat swelling  . Oxycodone Other (See Comments)    Dizziness, lips tingling  . Statins Other (See Comments)    Muscle weakness, weak  . Tape Other (See Comments)    Skin redness    VITALS:  Blood pressure (!) 143/75, pulse 79, temperature 97.8 F (36.6 C), temperature source Oral, resp. rate 16, height 5\' 5"  (1.651 m), weight 121.7 kg  (268 lb 3.2 oz), SpO2 92 %.  PHYSICAL EXAMINATION:  Physical Exam  GENERAL:  75 y.o.-year-old patient lying in the bed with no acute distress.  EYES: Pupils equal, round, reactive to light and accommodation. No scleral icterus. Extraocular muscles intact.  HEENT: Head atraumatic, normocephalic. Oropharynx and nasopharynx clear.  NECK:  Supple, no jugular venous distention. No thyroid enlargement, no tenderness.  LUNGS: Breath sounds much better bilaterally, improved expiratory wheezing, no  rales,rhonchi or crepitation. No use of accessory muscles of respiration. Decreased at the bases. CARDIOVASCULAR: S1, S2 normal. irregular. No murmurs, rubs, or gallops.  ABDOMEN: Soft, nontender, nondistended. Bowel sounds present. No organomegaly or mass.  EXTREMITIES: No pedal edema, cyanosis, or clubbing.  NEUROLOGIC: Cranial nerves II through XII are intact. Muscle strength 5/5 in all extremities. Sensation intact. Gait not checked.  PSYCHIATRIC: The patient is alert and oriented x 3.  SKIN: No obvious rash, lesion, or ulcer.    LABORATORY PANEL:   CBC  Recent Labs Lab 03/28/16 0429  WBC 14.3*  HGB 13.2  HCT 39.4  PLT 218   ------------------------------------------------------------------------------------------------------------------  Chemistries   Recent Labs Lab 03/24/16 1150  03/29/16 0439  NA 139  < > 141  K 4.4  < > 4.5  CL 104  < > 102  CO2 25  < > 31  GLUCOSE 149*  < > 199*  BUN 19  < > 38*  CREATININE 0.96  < > 0.71  CALCIUM 9.9  < > 9.3  MG  --   < > 2.1  AST 34  --   --   ALT 28  --   --   ALKPHOS 71  --   --   BILITOT 0.8  --   --   < > = values in this interval not displayed. ------------------------------------------------------------------------------------------------------------------  Cardiac Enzymes  Recent Labs Lab 03/24/16 1150  TROPONINI <0.03    ------------------------------------------------------------------------------------------------------------------  RADIOLOGY:  Dg Chest Port 1 View  Result Date: 03/29/2016 CLINICAL DATA:  Followup pneumonia. EXAM: PORTABLE CHEST 1 VIEW COMPARISON:  In 12/2015 FINDINGS: Persistent lower lobe collapse with associated effusions. Upper lobes remain clear. No unchanged since yesterday. IMPRESSION: Unchanged since yesterday. Persistent pleural effusions. Persistent lower lobe collapse and could be simple volume loss with or without pneumonia. Electronically Signed   By: Nelson Chimes M.D.   On: 03/29/2016 07:40    EKG:   Orders placed or performed during the hospital encounter of 03/24/16  . ED EKG  . ED EKG  . ED EKG within 10 minutes  . ED EKG within 10 minutes  . EKG 12-Lead  . EKG 12-Lead    ASSESSMENT AND PLAN:   75 year old female with past medical history significant for paroxysmal atrial fibrillation, asthma, arthritis, GERD, hypertension presents to hospital secondary to worsening shortness of breath.  #1 acute on chronic hypoxic respiratory failure-secondary to asthma, CHF exacerbation and pneumonia/bronchitis. -off Bipap and high flow, on CPAP at bedtime and 5L nasal cannula during day today.  -Wean to as tolerated. Started incentive spirometry - encourage to sit in a chair today -appreciate pulmonary consult. -BID IV steroids- dose reduced -On empiric antibiotics with Rocephin and azithromycin. Follow-up chest x-ray with bibasilar effusions- finish ABX course- total 5 days each  #2 atrial fibrillation with rapid ventricular response- likely triggered by respiratory status. -Appreciate cardiology input. Off cardizem drip - started daily digoxin -Also on oral Cardizem and metoprolol. -Remains on warfarin and INR is therapeutic  #3 elevated troponin-likely demand ischemia  #4 DVT prophylaxis-on Coumadin  #5 GERD- protonix   Plan to continue to wean o2 and encourage  physical therapy. PT consult   All the records are reviewed and case discussed with Care Management/Social Workerr. Management plans discussed with the patient, family and they are in agreement.  CODE STATUS: Full code  TOTAL TIME SPENT IN TAKING CARE OF THIS PATIENT: 38 minutes.   POSSIBLE D/C IN 2 DAYS, DEPENDING ON CLINICAL CONDITION.   Laronica Bhagat M.D on 03/30/2016 at 8:39 AM  Between 7am to 6pm - Pager - [phone]  After 6pm go to [email or web address] - password [email or web address] [facility]  Office  [phone]  CC: Primary care physician; [name], MD

## 2016-03-30 NOTE — Clinical Social Work Note (Signed)
MSW received consult that patient will need SNF for short term rehab.  Patient states she has been to rehab before at [facility] and would like to return.  MSW informed patient that if [facility] is full she will have to choose a different SNF.  MSW was given permission by patient to fax information to other SNFs in [facility]  Formal assessment to follow.  Jones Broom. Norval Morton, MSW [phone]  Mon-Fri 8a-4:30p 03/30/2016 4:33 PM

## 2016-03-30 NOTE — Progress Notes (Signed)
The chaplain knocked at the patient's door and introduced himself. The patient declined the service of the chaplain.

## 2016-03-30 NOTE — Progress Notes (Signed)
Inpatient Diabetes Program Recommendations  AACE/ADA: New Consensus Statement on Inpatient Glycemic Control (2015)  Target Ranges:  Prepandial:   less than 140 mg/dL      Peak postprandial:   less than 180 mg/dL (1-2 hours)      Critically ill patients:  140 - 180 mg/dL   Results for SABRYN, LAFORTE (MRN [id number]) as of 03/30/2016 13:14  Ref. Range 03/29/2016 08:05 03/29/2016 11:19 03/29/2016 16:13 03/29/2016 21:01  Glucose-Capillary Latest Ref Range: 65 - 99 mg/dL 190 (H) 320 (H) 196 (H) 208 (H)   Results for YANIAH, NORFLEET (MRN [id number]) as of 03/30/2016 13:14  Ref. Range 03/30/2016 07:35 03/30/2016 11:38  Glucose-Capillary Latest Ref Range: 65 - 99 mg/dL 196 (H) 236 (H)    Admit w/ CP  NO History of DM noted.   Pt got 125 mg IV Solumedrol X 1 dose yesterday at 9pm and now getting IV Solumedrol 20 mg BID.   Current Insulin Orders: Novolog Sensitive Correction Scale/ SSI (0-9 units) TID AC + HS      MD- Please consider increasing Novolog Correction Scale/ SSI to Moderate scale (0-15 units) TID AC + HS       --Will follow patient during hospitalization--  Wyn Quaker RN, MSN, CDE Diabetes Coordinator Inpatient Glycemic Control Team Team Pager: [phone] (8a-5p)

## 2016-03-30 NOTE — Progress Notes (Signed)
PHARMACIST - PHYSICIAN COMMUNICATION DR:   Tressia Miners CONCERNING: Antibiotic IV to Oral Route Change Policy  RECOMMENDATION: This patient is receiving azithromycin by the intravenous route.  Based on criteria approved by the Pharmacy and Therapeutics Committee, the antibiotic(s) is/are being converted to the equivalent oral dose form(s).   DESCRIPTION: These criteria include:  Patient being treated for a respiratory tract infection, urinary tract infection, cellulitis or clostridium difficile associated diarrhea if on metronidazole  The patient is not neutropenic and does not exhibit a GI malabsorption state  The patient is eating (either orally or via tube) and/or has been taking other orally administered medications for a least 24 hours  The patient is improving clinically and has a Tmax < 100.5  If you have questions about this conversion, please contact the [place], PharmD 03/30/16 8:47 AM

## 2016-03-30 NOTE — Care Management (Addendum)
Patient moved out of icu again on 10/8.   Patient is currently on 5 liters 02.  PT consult is pending.  She is experiencing elevated heart rate with exertion.  Physical therapy has been consulted.  Patient is agreeable to skilled nursing if it is recommended and would want to go to [facility].  She is on the waiting list to move into the community when there is an opening.  UPdated clinical Education officer, museum.

## 2016-03-30 NOTE — Progress Notes (Signed)
ANTICOAGULATION CONSULT NOTE - Follow up [facility] for Warfarin Indication: atrial fibrillation  Allergies  Allergen Reactions  . Amoxicillin Other (See Comments)    Lips swelling, tingling  . Benzocaine-Menthol     Throat swelling  . Biafine [Wound Dressings] Swelling  . Chloraseptic Sore Throat [Acetaminophen] Other (See Comments)    throat swelling  . Fosamax [Alendronate Sodium]   . Neosporin [Neomycin-Bacitracin Zn-Polymyx] Other (See Comments)    Skin redness, puffy  . Other     "chlorotrimitron"-- rxn: throat swelling  . Oxycodone Other (See Comments)    Dizziness, lips tingling  . Statins Other (See Comments)    Muscle weakness, weak  . Tape Other (See Comments)    Skin redness    Patient Measurements: Height: 5\' 5"  (165.1 cm) Weight: 268 lb 3.2 oz (121.7 kg) IBW/kg (Calculated) : 57  Vital Signs: Temp: 98.1 F (36.7 C) (10/09 1136) Temp Source: Oral (10/09 1136) BP: 150/77 (10/09 1136) Pulse Rate: 81 (10/09 1136)  Labs:  Recent Labs  03/28/16 0429 03/29/16 0439 03/30/16 0449  HGB 13.2  --   --   HCT 39.4  --   --   PLT 218  --   --   LABPROT 30.6* 26.3* 23.6*  INR 2.86 2.37 2.07  CREATININE 0.79 0.71  --     Estimated Creatinine Clearance: 80.7 mL/min (by C-G formula based on SCr of 0.71 mg/dL).   Assessment: 75 y/o F with a h/o AFib admitted with CP. Patient on warfarin 4 mg daily PTA with therapeutic INR on admission. Pt was receiving levofloxacin, now receiving Ceftriaxone and Azithromycin.  10/3  INR 2.36 - warfarin 3 mg 10/4  INR 2.55 - warfarin 3 mg 10/5  INR 2.56 - warfarin 3 mg 10/6  INR 2.88 - warfarin 1 mg 10/7  INR 2.86 - Warfarin 1mg   10/8  INR 2.37- Warfarin 2 mg  Goal of Therapy:  INR 2-3   Plan:  INR = 2.07 today. Antibiotics stopped today after adequate duration of therapy. Will resume patient's home dose of warfarin 4 mg PO daily.  Lenis Noon, PharmD, BCPS Clinical Pharmacist 03/30/2016 2:34  PM

## 2016-03-30 NOTE — Evaluation (Signed)
Physical Therapy Evaluation Patient Details Name: Laura Gonzalez MRN: [id number] DOB: Sep 10, 1940 Today's Date: 03/30/2016   History of Present Illness  Pt admitted for chest pain. Pt with complicated stay secondary to bipap intervention bringing her to CCU. Pt is now back on unit on 5L of O2 via [place]. Pt with history of arthritis, GERD, asthma, and sleep apnea.  Clinical Impression  Pt is a pleasant 75 year old female who was admitted for chest pain. Pt performs bed mobility, transfers, and ambulation with with min assist and rw. Pt demonstrates deficits with endurance/mobility/strength. All mobility performed while on 5L of O2 with de-sat noted. RN aware. Pt is currently not at baseline level at this time. Would benefit from skilled PT to address above deficits and promote optimal return to PLOF; recommend transition to STR upon discharge from acute hospitalization.       Follow Up Recommendations SNF    Equipment Recommendations       Recommendations for Other Services       Precautions / Restrictions Precautions Precautions: Fall Restrictions Weight Bearing Restrictions: No      Mobility  Bed Mobility Overal bed mobility: Needs Assistance Bed Mobility: Supine to Sit     Supine to sit: Min assist     General bed mobility comments: assist for sliding B LE off bed and use of railing to promote upright mobility. Once seated at EOB, slight assist required to scoot towards EOB and feet to touch ground  Transfers Overall transfer level: Needs assistance Equipment used: Rolling walker (2 wheeled) Transfers: Sit to/from Stand Sit to Stand: Min assist         General transfer comment: assist for upright posture and to push from seated surface. Once standing, pt able to stand with upright posture.  Ambulation/Gait Ambulation/Gait assistance: Min assist Ambulation Distance (Feet): 3 Feet Assistive device: Rolling walker (2 wheeled) Gait Pattern/deviations: Step-to  pattern     General Gait Details: ambulated to recliner with slow step to gait pattern. Cues given for sequencing. No buckling or LOB noted. O2 sats decreased while on 5L of O2 to 85%, limited further ambulation.  Stairs            Wheelchair Mobility    Modified Rankin (Stroke Patients Only)       Balance Overall balance assessment: Needs assistance Sitting-balance support: Feet supported Sitting balance-Leahy Scale: Normal     Standing balance support: Bilateral upper extremity supported Standing balance-Leahy Scale: Good                               Pertinent Vitals/Pain Pain Assessment: No/denies pain    Home Living Family/patient expects to be discharged to:: Private residence Living Arrangements: Alone Available Help at Discharge: Neighbor Type of Home: House Home Access: Stairs to enter Entrance Stairs-Rails: None Entrance Stairs-Number of Steps: 1 Home Layout: Able to live on main level with bedroom/bathroom Home Equipment: Walker - 2 wheels;Cane - single point      Prior Function Level of Independence: Independent with assistive device(s)         Comments: previously limited community ambulator, still drives     Hand Dominance        Extremity/Trunk Assessment   Upper Extremity Assessment: Generalized weakness (B UE grossly 3+/5)           Lower Extremity Assessment: Generalized weakness (B LE grossly 4-/5)         Communication  Communication: No difficulties  Cognition Arousal/Alertness: Awake/alert Behavior During Therapy: WFL for tasks assessed/performed Overall Cognitive Status: Within Functional Limits for tasks assessed                      General Comments      Exercises Other Exercises Other Exercises: seated ther-ex performed including B LE LAQ, alt. marching, hip add squeezes, and hip abd/add. All ther-ex performed x 10 reps with cga. Safe technique performed   Assessment/Plan    PT  Assessment Patient needs continued PT services  PT Problem List Decreased strength;Decreased balance;Decreased mobility;Cardiopulmonary status limiting activity          PT Treatment Interventions Gait training;Therapeutic exercise    PT Goals (Current goals can be found in the Care Plan section)  Acute Rehab PT Goals Patient Stated Goal: to get stronger PT Goal Formulation: With patient Time For Goal Achievement: 04/13/16 Potential to Achieve Goals: Good    Frequency Min 2X/week   Barriers to discharge Decreased caregiver support      Co-evaluation               End of Session Equipment Utilized During Treatment: Gait belt;Oxygen Activity Tolerance: Treatment limited secondary to medical complications (Comment) Patient left: in chair;with chair alarm set;with call bell/phone within reach Nurse Communication: Mobility status         Time: [id number] PT Time Calculation (min) (ACUTE ONLY): 28 min   Charges:   PT Evaluation $PT Eval Moderate Complexity: 1 Procedure PT Treatments $Therapeutic Exercise: 8-22 mins   PT G Codes:        Lygia Olaes 2016/04/11, 3:26 PM Greggory Stallion, PT, DPT [phone]

## 2016-03-31 LAB — BASIC METABOLIC PANEL
ANION GAP: 6 (ref 5–15)
BUN: 35 mg/dL — ABNORMAL HIGH (ref 6–20)
CHLORIDE: 103 mmol/L (ref 101–111)
CO2: 31 mmol/L (ref 22–32)
Calcium: 8.8 mg/dL — ABNORMAL LOW (ref 8.9–10.3)
Creatinine, Ser: 0.71 mg/dL (ref 0.44–1.00)
GFR calc non Af Amer: >60 mL/min (ref >60)
Glucose, Bld: 160 mg/dL — ABNORMAL HIGH (ref 65–99)
Potassium: 4.5 mmol/L (ref 3.5–5.1)
Sodium: 140 mmol/L (ref 135–145)

## 2016-03-31 LAB — GLUCOSE, CAPILLARY
GLUCOSE-CAPILLARY: 272 mg/dL — AB (ref 65–99)
GLUCOSE-CAPILLARY: 235 mg/dL — AB (ref 65–99)
Glucose-Capillary: 141 mg/dL — ABNORMAL HIGH (ref 65–99)
Glucose-Capillary: 195 mg/dL — ABNORMAL HIGH (ref 65–99)

## 2016-03-31 LAB — PROTIME-INR
INR: 2.26
Prothrombin Time: 25.3 seconds — ABNORMAL HIGH (ref 11.4–15.2)

## 2016-03-31 NOTE — Progress Notes (Signed)
Pt has home CPAP plugged into red outlet. No signs of damage to machine.

## 2016-03-31 NOTE — Clinical Social Work Placement (Signed)
   CLINICAL SOCIAL WORK PLACEMENT  NOTE  Date:  03/31/2016  Patient Details  Name: Laura Gonzalez MRN: [id number] Date of Birth: [birth year]  Clinical Social Work is seeking post-discharge placement for this patient at the [place] level of care (*CSW will initial, date and re-position this form in  chart as items are completed):  Yes   Patient/family provided with [place] Work Department's list of facilities offering this level of care within the geographic area requested by the patient (or if unable, by the patient's family).  Yes   Patient/family informed of their freedom to choose among providers that offer the needed level of care, that participate in Medicare, Medicaid or managed care program needed by the patient, have an available bed and are willing to accept the patient.  Yes   Patient/family informed of [facility]'s ownership interest in [facility] and [facility], as well as of the fact that they are under no obligation to receive care at these facilities.  PASRR submitted to EDS on 03/31/16     PASRR number received on       Existing PASRR number confirmed on 03/31/16     FL2 transmitted to all facilities in geographic area requested by pt/family on 03/31/16     FL2 transmitted to all facilities within larger geographic area on       Patient informed that his/her managed care company has contracts with or will negotiate with certain facilities, including the following:            Patient/family informed of bed offers received.  Patient chooses bed at       Physician recommends and patient chooses bed at      Patient to be transferred to   on  .  Patient to be transferred to facility by       Patient family notified on   of transfer.  Name of family member notified:        PHYSICIAN Please sign FL2     Additional Comment:    _______________________________________________ Ross Ludwig 03/31/2016, 9:38  AM

## 2016-03-31 NOTE — Progress Notes (Signed)
[facility] NAME: Laura Gonzalez    MR#:  [id number]  DATE OF BIRTH:  [birth year]  SUBJECTIVE:  CHIEF COMPLAINT:   Chief Complaint  Patient presents with  . Chest Pain   -Converted to normal sinus rhythm. Heart rate is well controlled. -Remains on 5 L oxygen. But feels much better.Marland Kitchen  REVIEW OF SYSTEMS:  Review of Systems  Constitutional: Negative for chills, fever and malaise/fatigue.  HENT: Negative for ear discharge, ear pain and nosebleeds.   Eyes: Negative for blurred vision and double vision.  Respiratory: Positive for shortness of breath. Negative for cough and wheezing.   Cardiovascular: Negative for chest pain, palpitations and leg swelling.  Gastrointestinal: Negative for abdominal pain, constipation, diarrhea, nausea and vomiting.  Genitourinary: Negative for dysuria and urgency.  Musculoskeletal: Positive for joint pain. Negative for myalgias.       Both knee pain from arthritis  Neurological: Negative for dizziness, sensory change, speech change, focal weakness, seizures and headaches.  Psychiatric/Behavioral: Negative for depression.    DRUG ALLERGIES:   Allergies  Allergen Reactions  . Amoxicillin Other (See Comments)    Lips swelling, tingling  . Benzocaine-Menthol     Throat swelling  . Biafine [Wound Dressings] Swelling  . Chloraseptic Sore Throat [Acetaminophen] Other (See Comments)    throat swelling  . Fosamax [Alendronate Sodium]   . Neosporin [Neomycin-Bacitracin Zn-Polymyx] Other (See Comments)    Skin redness, puffy  . Other     "chlorotrimitron"-- rxn: throat swelling  . Oxycodone Other (See Comments)    Dizziness, lips tingling  . Statins Other (See Comments)    Muscle weakness, weak  . Tape Other (See Comments)    Skin redness    VITALS:  Blood pressure (!) 151/65, pulse 66, temperature 97.6 F (36.4 C), temperature source Oral, resp. rate 20, height 5\' 5"  (1.651 m), weight 121.7 kg  (268 lb 3.2 oz), SpO2 92 %.  PHYSICAL EXAMINATION:  Physical Exam  GENERAL:  75 y.o.-year-old patient lying in the bed with no acute distress.  EYES: Pupils equal, round, reactive to light and accommodation. No scleral icterus. Extraocular muscles intact.  HEENT: Head atraumatic, normocephalic. Oropharynx and nasopharynx clear.  NECK:  Supple, no jugular venous distention. No thyroid enlargement, no tenderness.  LUNGS: Breath sounds much better bilaterally, improved wheezing, no  rales,rhonchi or crepitation. No use of accessory muscles of respiration. Decreased at the bases. CARDIOVASCULAR: S1, S2 normal. irregular. No murmurs, rubs, or gallops.  ABDOMEN: Soft, nontender, nondistended. Bowel sounds present. No organomegaly or mass.  EXTREMITIES: No pedal edema, cyanosis, or clubbing.  NEUROLOGIC: Cranial nerves II through XII are intact. Muscle strength 5/5 in all extremities. Sensation intact. Gait not checked.  PSYCHIATRIC: The patient is alert and oriented x 3.  SKIN: No obvious rash, lesion, or ulcer.    LABORATORY PANEL:   CBC  Recent Labs Lab 03/28/16 0429  WBC 14.3*  HGB 13.2  HCT 39.4  PLT 218   ------------------------------------------------------------------------------------------------------------------  Chemistries   Recent Labs Lab 03/24/16 1150  03/29/16 0439 03/31/16 0508  NA 139  < > 141 140  K 4.4  < > 4.5 4.5  CL 104  < > 102 103  CO2 25  < > 31 31  GLUCOSE 149*  < > 199* 160*  BUN 19  < > 38* 35*  CREATININE 0.96  < > 0.71 0.71  CALCIUM 9.9  < > 9.3 8.8*  MG  --   < >  2.1  --   AST 34  --   --   --   ALT 28  --   --   --   ALKPHOS 71  --   --   --   BILITOT 0.8  --   --   --   < > = values in this interval not displayed. ------------------------------------------------------------------------------------------------------------------  Cardiac Enzymes  Recent Labs Lab 03/24/16 1150  TROPONINI <0.03    ------------------------------------------------------------------------------------------------------------------  RADIOLOGY:  No results found.  EKG:   Orders placed or performed during the hospital encounter of 03/24/16  . ED EKG  . ED EKG  . ED EKG within 10 minutes  . ED EKG within 10 minutes  . EKG 12-Lead  . EKG 12-Lead    ASSESSMENT AND PLAN:   75 year old female with past medical history significant for paroxysmal atrial fibrillation, asthma, arthritis, GERD, hypertension presents to hospital secondary to worsening shortness of breath.  #1 acute on chronic hypoxic respiratory failure-secondary to asthma, CHF exacerbation and pneumonia/bronchitis. -off Bipap and high flow, on CPAP at bedtime and 5L nasal cannula during day today.  -Wean to as tolerated. Started incentive spirometry - encourage to Work with physical therapy -appreciate pulmonary consult. -BID IV steroids- dose reduced-changed to oral steroids tomorrow -On empiric antibiotics with Rocephin and azithromycin. Follow-up chest x-ray with bibasilar effusions- finish ABX course- total 5 days  #2 atrial fibrillation with rapid ventricular response- likely triggered by respiratory status. -History of paroxysmal atrial fibrillation -Converted to normal sinus rhythm -Appreciate cardiology input.  - started on daily digoxin- discontinue at discharge as EF is normal -on oral Cardizem and metoprolol. -Remains on warfarin and INR is therapeutic  #3 elevated troponin-likely demand ischemia  #4 DVT prophylaxis-on Coumadin, INR is therapeutic  #5 GERD- protonix   Continue to wean oxygen. Possible discharge to rehabilitation tomorrow. Appreciate physical therapy consult   All the records are reviewed and case discussed with Care Management/Social Workerr. Management plans discussed with the patient, family and they are in agreement.  CODE STATUS: Full code  TOTAL TIME SPENT IN TAKING CARE OF THIS PATIENT:  37 minutes.   POSSIBLE D/C TOMORROW, DEPENDING ON CLINICAL CONDITION.   Lan Mcneill M.D on 03/31/2016 at 10:18 AM  Between 7am to 6pm - Pager - [phone]  After 6pm go to [email or web address] - password [email or web address] [facility]  Office  [phone]  CC: Primary care physician; [name], MD

## 2016-03-31 NOTE — Progress Notes (Signed)
SATURATION QUALIFICATIONS: (This note is used to comply with regulatory documentation for home oxygen)  Patient Saturations on Room Air at Rest = 90%  Patient Saturations on Room Air while Ambulating = NA  Patient Saturations on 1 Liters of oxygen while Ambulating = 93%  Please briefly explain why patient needs home oxygen:

## 2016-03-31 NOTE — Progress Notes (Signed)
Physical Therapy Treatment Patient Details Name: Laura Gonzalez MRN: [id number] DOB: February 18, 1941 Today's Date: 03/31/2016    History of Present Illness Pt admitted for chest pain. Pt with complicated stay secondary to bipap intervention bringing her to CCU. Pt is now back on unit on 5L of O2 via [place]. Pt with history of arthritis, GERD, asthma, and sleep apnea.    PT Comments    Pt demonstrates dramatic improvement in mobility since yesterday. She is able to complete a full lap around RN station on room air. Pt does report mild DOE however SaO2 remains at or above 90%. Two standing rest breaks provided for patient during ambulation. She is able to complete all seated exercises as instructed by therapist. Discharge recommendation updated to home with [facility] PT. Pt is in agreement. Pt encouraged to use rolling walker at discharge for added stability. Pt will benefit from skilled PT services to address deficits in strength, balance, and mobility in order to return to full function at home.    Follow Up Recommendations  Home health PT     Equipment Recommendations  None recommended by PT    Recommendations for Other Services       Precautions / Restrictions Precautions Precautions: Fall Restrictions Weight Bearing Restrictions: No    Mobility  Bed Mobility               General bed mobility comments: Recieved upright in recliner and left sitting upright in chair  Transfers Overall transfer level: Needs assistance Equipment used: Rolling walker (2 wheeled) Transfers: Sit to/from Stand Sit to Stand: Min guard         General transfer comment: Pt able to come to standing without external assistance. She does require increased time due to LE weakness and deconditioning. Safe hand placement noted without cues  Ambulation/Gait Ambulation/Gait assistance: Min guard Ambulation Distance (Feet): 220 Feet Assistive device: Rolling walker (2 wheeled) Gait Pattern/deviations:  Decreased step length - right;Decreased step length - left Gait velocity: Decreased but functional for limited household mobility   General Gait Details: Pt able to ambulate a full lap around RN station with PT on this date. Decreased gait speed but functional for limited household ambulation. Pt demonstrates good safety and stability with rolling walker and encouraged use at discharge. On room air SaO2 at 91%. With exertion on room air SaO2 remains at or above 90% and is actually higher at times than at rest. Pt requires 2 standing rest breaks during ambulation but is able to complete a full lap.   Stairs            Wheelchair Mobility    Modified Rankin (Stroke Patients Only)       Balance Overall balance assessment: Needs assistance Sitting-balance support: No upper extremity supported Sitting balance-Leahy Scale: Normal     Standing balance support: No upper extremity supported Standing balance-Leahy Scale: Fair                      Cognition Arousal/Alertness: Awake/alert Behavior During Therapy: WFL for tasks assessed/performed Overall Cognitive Status: Within Functional Limits for tasks assessed                      Exercises General Exercises - Lower Extremity Long Arc Quad: Strengthening;Both;15 reps;Seated Heel Slides: Strengthening;Both;15 reps;Seated Hip ABduction/ADduction: Strengthening;Both;15 reps;Seated Hip Flexion/Marching: Strengthening;Both;15 reps;Seated Heel Raises: Strengthening;Both;15 reps;Seated    General Comments        Pertinent Vitals/Pain Pain Assessment: No/denies  pain    Home Living                      Prior Function            PT Goals (current goals can now be found in the care plan section) Acute Rehab PT Goals Patient Stated Goal: to get stronger PT Goal Formulation: With patient Time For Goal Achievement: 04/13/16 Potential to Achieve Goals: Good Progress towards PT goals: Progressing toward  goals    Frequency    Min 2X/week      PT Plan Discharge plan needs to be updated    Co-evaluation             End of Session Equipment Utilized During Treatment: Gait belt Activity Tolerance: Patient tolerated treatment well Patient left: in chair;with chair alarm set;with call bell/phone within reach     Time: 1410-1436 PT Time Calculation (min) (ACUTE ONLY): 26 min  Charges:  $Gait Training: 8-22 mins $Therapeutic Exercise: 8-22 mins                    G Codes:      Lyndel Safe Huprich PT, DPT   Huprich,Jason 03/31/2016, 3:40 PM

## 2016-03-31 NOTE — Progress Notes (Signed)
ANTICOAGULATION CONSULT NOTE - Follow up [facility] for Warfarin Indication: atrial fibrillation  Allergies  Allergen Reactions  . Amoxicillin Other (See Comments)    Lips swelling, tingling  . Benzocaine-Menthol     Throat swelling  . Biafine [Wound Dressings] Swelling  . Chloraseptic Sore Throat [Acetaminophen] Other (See Comments)    throat swelling  . Fosamax [Alendronate Sodium]   . Neosporin [Neomycin-Bacitracin Zn-Polymyx] Other (See Comments)    Skin redness, puffy  . Other     "chlorotrimitron"-- rxn: throat swelling  . Oxycodone Other (See Comments)    Dizziness, lips tingling  . Statins Other (See Comments)    Muscle weakness, weak  . Tape Other (See Comments)    Skin redness    Patient Measurements: Height: 5\' 5"  (165.1 cm) Weight: 268 lb 3.2 oz (121.7 kg) IBW/kg (Calculated) : 57  Vital Signs: Temp: 97.6 F (36.4 C) (10/10 0424) Temp Source: Oral (10/10 0424) BP: 151/65 (10/10 0424) Pulse Rate: 66 (10/10 0424)  Labs:  Recent Labs  03/29/16 0439 03/30/16 0449 03/31/16 0508  LABPROT 26.3* 23.6* 25.3*  INR 2.37 2.07 2.26  CREATININE 0.71  --  0.71    Estimated Creatinine Clearance: 80.7 mL/min (by C-G formula based on SCr of 0.71 mg/dL).   Assessment: 75 y/o F with a h/o AFib admitted with CP. Patient on warfarin 4 mg daily PTA with therapeutic INR on admission.   Antibiotic course completed on 10/9  10/3   INR 2.36 - warfarin 3 mg  10/4   INR 2.55 - warfarin 3 mg 10/5   INR 2.56 - warfarin 3 mg 10/6   INR 2.88 - warfarin 1 mg 10/7   INR 2.86 - Warfarin 1mg   10/8   INR 2.37- Warfarin 2 mg 10/9   INR 2.07 - Warfarin 4 mg 10/10 INR 2.26 -   Goal of Therapy:  INR 2-3   Plan:  INR = 2.26 today. Antibiotic course completed on 10/9. Will continue home dose of warfarin 4 mg PO daily as patient was therapeutic on admission.  Lenis Noon, PharmD, BCPS Clinical Pharmacist 03/31/2016 10:39 AM

## 2016-03-31 NOTE — Progress Notes (Signed)
SATURATION QUALIFICATIONS: (This note is used to comply with regulatory documentation for home oxygen)  Patient Saturations on Room Air at Rest = 92%  Patient Saturations on Room Air while Ambulating = 90%  Patient Saturations on Liters of oxygen while Ambulating = n/a%  Please briefly explain why patient needs home oxygen:

## 2016-03-31 NOTE — NC FL2 (Signed)
[place] LEVEL OF CARE SCREENING TOOL     IDENTIFICATION  Patient Name: Laura Gonzalez Birthdate: 05-20-1941 Sex: female Admission Date (Current Location): 03/24/2016  [place] and [place] Number:  Engineering geologist and Address:  [facility], [address], [place], [place] [address]      Provider Number: [id number]  Attending Physician Name and Address:  Gladstone Lighter, MD  Relative Name and Phone Number:  [facility] & Reggie Neighbor [phone]  [phone]     Current Level of Care: Hospital Recommended Level of Care: [place] Prior Approval Number:    Date Approved/Denied:   PASRR Number: [id number]  Discharge Plan: SNF    Current Diagnoses: Patient Active Problem List   Diagnosis Date Noted  . Hypotension 03/24/2016  . Diverticulitis 12/11/2014  . Sepsis ([place]) 12/11/2014  . Persistent atrial fibrillation ([place]) 12/11/2014  . HTN (hypertension) 12/11/2014  . GERD (gastroesophageal reflux disease) 12/11/2014  . Asthma 12/11/2014    Orientation RESPIRATION BLADDER Height & Weight     Self, Time, Situation, Place  O2 (5L) Continent Weight: 268 lb 3.2 oz (121.7 kg) Height:  5\' 5"  (165.1 cm)  BEHAVIORAL SYMPTOMS/MOOD NEUROLOGICAL BOWEL NUTRITION STATUS      Continent Diet (Carb Modified)  AMBULATORY STATUS COMMUNICATION OF NEEDS Skin   Limited Assist Verbally Normal                       Personal Care Assistance Level of Assistance  Bathing, Dressing, Feeding Bathing Assistance: Limited assistance Feeding assistance: Limited assistance Dressing Assistance: Limited assistance     Functional Limitations Info  Sight, Hearing, Speech Sight Info: Adequate Hearing Info: Adequate Speech Info: Adequate    SPECIAL CARE FACTORS FREQUENCY  PT (By licensed PT)     PT Frequency: 5x a week              Contractures Contractures Info: Not present    Additional Factors Info  Allergies,  Insulin Sliding Scale   Allergies Info: AMOXICILLIN, BENZOCAINE-MENTHOL, BIAFINE WOUND DRESSINGS, CHLORASEPTIC SORE THROAT ACETAMINOPHEN, FOSAMAX ALENDRONATE SODIUM, NEOSPORIN NEOMYCIN-BACITRACIN ZN-POLYMYX, OTHER, OXYCODONE, STATINS, TAPE    Insulin Sliding Scale Info: 3x a day       Current Medications (03/31/2016):  This is the current hospital active medication list Current Facility-Administered Medications  Medication Dose Route Frequency Provider Last Rate Last Dose  . 0.9 %  sodium chloride infusion  250 mL Intravenous PRN Wilhelmina Mcardle, MD      . acetaminophen (TYLENOL) tablet 650 mg  650 mg Oral Q4H PRN Wilhelmina Mcardle, MD      . bisacodyl (DULCOLAX) EC tablet 5 mg  5 mg Oral Daily PRN Wilhelmina Mcardle, MD   5 mg at 03/27/16 1506  . calcium citrate-vitamin D 500-400 MG-UNIT per chewable tablet 1 tablet  1 tablet Oral BID Flora Lipps, MD   1 tablet at 03/30/16 2150  . digoxin (LANOXIN) tablet 0.125 mg  0.125 mg Oral Daily Gladstone Lighter, MD   0.125 mg at 03/30/16 0924  . diltiazem (CARDIZEM CD) 24 hr capsule 300 mg  300 mg Oral Daily Theodoro Grist, MD   300 mg at 03/30/16 0923  . ezetimibe (ZETIA) tablet 10 mg  10 mg Oral Daily Flora Lipps, MD   10 mg at 03/30/16 0923  . feeding supplement (ENSURE ENLIVE) (ENSURE ENLIVE) liquid 237 mL  237 mL Oral BID BM Loletha Grayer, MD   237 mL at 03/30/16 1400  .  fluticasone (FLONASE) 50 MCG/ACT nasal spray 1 spray  1 spray Each Nare Daily Flora Lipps, MD   1 spray at 03/30/16 0923  . insulin aspart (novoLOG) injection 0-5 Units  0-5 Units Subcutaneous QHS Gladstone Lighter, MD   2 Units at 03/30/16 2151  . insulin aspart (novoLOG) injection 0-9 Units  0-9 Units Subcutaneous TID WC Gladstone Lighter, MD   1 Units at 03/31/16 [id number]  . ipratropium-albuterol (DUONEB) 0.5-2.5 (3) MG/3ML nebulizer solution 3 mL  3 mL Nebulization QID Flora Lipps, MD   3 mL at 03/31/16 0817  . loratadine (CLARITIN) tablet 10 mg  10 mg Oral Daily Flora Lipps, MD   10  mg at 03/30/16 0924  . magnesium oxide (MAG-OX) tablet 400 mg  400 mg Oral Daily Flora Lipps, MD   400 mg at 03/30/16 0924  . methylPREDNISolone sodium succinate (SOLU-MEDROL) 40 mg/mL injection 20 mg  20 mg Intravenous Q12H Gladstone Lighter, MD   20 mg at 03/31/16 0622  . metoprolol tartrate (LOPRESSOR) tablet 12.5 mg  12.5 mg Oral BID Flora Lipps, MD   12.5 mg at 03/30/16 2151  . montelukast (SINGULAIR) tablet 10 mg  10 mg Oral QHS Wilhelmina Mcardle, MD   10 mg at 03/30/16 2150  . omega-3 acid ethyl esters (LOVAZA) capsule 1 g  1 g Oral BID Flora Lipps, MD   1 g at 03/30/16 2150  . ondansetron (ZOFRAN) injection 4 mg  4 mg Intravenous Q6H PRN Wilhelmina Mcardle, MD      . pantoprazole (PROTONIX) EC tablet 40 mg  40 mg Oral Daily Gladstone Lighter, MD   40 mg at 03/30/16 0923  . traMADol (ULTRAM) tablet 50-100 mg  50-100 mg Oral Q6H PRN Wilhelmina Mcardle, MD   50 mg at 03/27/16 0825  . vitamin B-12 (CYANOCOBALAMIN) tablet 1,000 mcg  1,000 mcg Oral Daily Flora Lipps, MD   1,000 mcg at 03/30/16 0923  . warfarin (COUMADIN) tablet 4 mg  4 mg Oral q1800 Lenis Noon, RPH   4 mg at 03/30/16 1659  . Warfarin - Pharmacist Dosing Inpatient   Does not apply [place]         Discharge Medications: Please see discharge summary for a list of discharge medications.  Relevant Imaging Results:  Relevant Lab Results:   Additional Information SSN [id number]  Patient uses a CPAP at night which she has with her here at the hospital.  Ross Ludwig

## 2016-03-31 NOTE — Progress Notes (Signed)
Inpatient Diabetes Program Recommendations  AACE/ADA: New Consensus Statement on Inpatient Glycemic Control (2015)  Target Ranges:  Prepandial:   less than 140 mg/dL      Peak postprandial:   less than 180 mg/dL (1-2 hours)      Critically ill patients:  140 - 180 mg/dL   Results for Laura Gonzalez, Laura Gonzalez (MRN [id number]) as of 03/31/2016 13:40  Ref. Range 03/30/2016 07:35 03/30/2016 11:38 03/30/2016 16:36 03/30/2016 21:33  Glucose-Capillary Latest Ref Range: 65 - 99 mg/dL 196 (H) 236 (H) 184 (H) 238 (H)   Results for Laura Gonzalez, Laura Gonzalez (MRN [id number]) as of 03/31/2016 13:40  Ref. Range 03/31/2016 07:28 03/31/2016 11:41  Glucose-Capillary Latest Ref Range: 65 - 99 mg/dL 141 (H) 272 (H)    Admit w/ CP  NO History of DM noted.   Pt got 125 mg IV Solumedrol X 1 dose 10/08 at 9pm and now getting IV Solumedrol 20 mg BID.   Current Insulin Orders: Novolog Sensitive Correction Scale/ SSI (0-9 units) TID AC + HS      MD- Please consider increasing Novolog Correction Scale/ SSI to Moderate scale (0-15 units) TID AC + HS     --Will follow patient during hospitalization--  Wyn Quaker RN, MSN, CDE Diabetes Coordinator Inpatient Glycemic Control Team Team Pager: [phone] (8a-5p)
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
## 2016-03-31 NOTE — Clinical Social Work Note (Signed)
MSW presented bed offers to patient and she would like to go to [facility].  MSW contacted [facility] and they are able to accept patient once she is medically ready for discharge and orders have been received.  Jones Broom. Evah Rashid, MSW [phone]  Mon-Fri 8a-4:30p 03/31/2016 11:05 AM

## 2016-04-01 LAB — GLUCOSE, CAPILLARY
GLUCOSE-CAPILLARY: 150 mg/dL — AB (ref 65–99)
GLUCOSE-CAPILLARY: 194 mg/dL — AB (ref 65–99)

## 2016-04-01 LAB — PROTIME-INR
INR: 2.26
Prothrombin Time: 25.3 seconds — ABNORMAL HIGH (ref 11.4–15.2)

## 2016-04-01 MED ORDER — DIGOXIN 125 MCG PO TABS: 0.1250 mg | ORAL_TABLET | Freq: Every day | ORAL | 0 refills | Status: DC

## 2016-04-01 MED ORDER — MOMETASONE FURO-FORMOTEROL FUM 200-5 MCG/ACT IN AERO
2.0000 | INHALATION_SPRAY | Freq: Two times a day (BID) | RESPIRATORY_TRACT | Status: DC
  Administered 2016-04-01: 2 via RESPIRATORY_TRACT
  Filled 2016-04-01: qty 8.8

## 2016-04-01 MED ORDER — MOMETASONE FURO-FORMOTEROL FUM 200-5 MCG/ACT IN AERO: 2.0000 | INHALATION_SPRAY | Freq: Two times a day (BID) | RESPIRATORY_TRACT | 1 refills | Status: DC

## 2016-04-01 MED ORDER — HYDROCHLOROTHIAZIDE 12.5 MG PO TABS: 12.5000 mg | ORAL_TABLET | Freq: Every day | ORAL | 2 refills | Status: DC

## 2016-04-01 MED ORDER — PREDNISONE 10 MG (21) PO TBPK: 10.0000 mg | ORAL_TABLET | Freq: Every day | ORAL | 0 refills | Status: DC

## 2016-04-01 MED ORDER — POTASSIUM CHLORIDE CRYS ER 20 MEQ PO TBCR: 20.0000 meq | EXTENDED_RELEASE_TABLET | Freq: Two times a day (BID) | ORAL | 0 refills | Status: DC

## 2016-04-01 NOTE — Progress Notes (Signed)
ANTICOAGULATION CONSULT NOTE - Follow up [place] for Warfarin Indication: atrial fibrillation  Allergies  Allergen Reactions  . Amoxicillin Other (See Comments)    Lips swelling, tingling  . Benzocaine-Menthol     Throat swelling  . Biafine [Wound Dressings] Swelling  . Chloraseptic Sore Throat [Acetaminophen] Other (See Comments)    throat swelling  . Fosamax [Alendronate Sodium]   . Neosporin [Neomycin-Bacitracin Zn-Polymyx] Other (See Comments)    Skin redness, puffy  . Other     "chlorotrimitron"-- rxn: throat swelling  . Oxycodone Other (See Comments)    Dizziness, lips tingling  . Statins Other (See Comments)    Muscle weakness, weak  . Tape Other (See Comments)    Skin redness    Patient Measurements: Height: 5\' 5"  (165.1 cm) Weight: 274 lb 4 oz (124.4 kg) IBW/kg (Calculated) : 57  Vital Signs: Temp: 98.2 F (36.8 C) (10/11 0408) Temp Source: Oral (10/11 0408) BP: 136/70 (10/11 0408) Pulse Rate: 83 (10/11 0934)  Labs:  Recent Labs  03/30/16 0449 03/31/16 0508 04/01/16 0433  LABPROT 23.6* 25.3* 25.3*  INR 2.07 2.26 2.26  CREATININE  --  0.71  --     Estimated Creatinine Clearance: 81.8 mL/min (by C-G formula based on SCr of 0.71 mg/dL).   Assessment: 74 y/o F with a h/o AFib admitted with CP. Patient on warfarin 4 mg daily PTA with therapeutic INR on admission.   Antibiotic course completed on 10/9  10/3   INR 2.36 - warfarin 3 mg  10/4   INR 2.55 - warfarin 3 mg 10/5   INR 2.56 - warfarin 3 mg 10/6   INR 2.88 - warfarin 1 mg 10/7   INR 2.86 - Warfarin 1mg   10/8   INR 2.37- Warfarin 2 mg 10/9   INR 2.07 - Warfarin 4 mg 10/10 INR 2.26 - Warfarin 4 mg  Goal of Therapy:  INR 2-3   Plan:  INR = 2.26 today. Antibiotic course completed on 10/9. Will continue home dose of warfarin 4 mg PO daily as patient was therapeutic on admission.  Lenis Noon, PharmD, BCPS Clinical Pharmacist 04/01/2016 9:48 AM

## 2016-04-01 NOTE — Care Management (Addendum)
Informed that patient is for discharge hoe today.  She is in agreement.  Requested SN PT OT and aide. Patient did not qualify for home 02 yesterday but informed that she had an episode of desat last pm.  Requested she be reassessed for home 02 today.  Patient requested not to have home health through [facility].  She asked to have a home health agency that could see patient without a doubt within 24 hours of discharge. Called the referral to [facility] (did not call [facility] because this CM is awaiting outcome of another case in which 24 hour visit was requested and promised but did not happen).  orders for SN PT OT and Aide.  Need glycemic and cp status monitored with pulse ox.  Monitoring of heart rate and ability to manage her plan of care .  Referral was accepted by [facility] with the promise of visti within 24 hours.  have reached out to attending regarding the need for home health nurse to draw protime.

## 2016-04-01 NOTE — Care Management (Signed)
Patient qualified for home 02.  Agency preference is [facility]. Referral called and requested order for the 02 from attending

## 2016-04-01 NOTE — Progress Notes (Signed)
SATURATION QUALIFICATIONS: (This note is used to comply with regulatory documentation for home oxygen)  Patient Saturations on Room Air at Rest = 90%  Patient Saturations on Room Air while Ambulating = 87%  Patient Saturations on 2 Liters of oxygen while Ambulating = 93%  Please briefly explain why patient needs home oxygen: 

## 2016-04-01 NOTE — Discharge Summary (Signed)
[place] [facility] [place] NAME: Laura Gonzalez    MR#:  [id number]  DATE OF BIRTH:  1941/02/17  DATE OF ADMISSION:  03/24/2016   ADMITTING PHYSICIAN: Wilhelmina Mcardle, MD  DATE OF DISCHARGE: 04/01/2016  PRIMARY CARE PHYSICIAN: [name], MD   ADMISSION DIAGNOSIS:   Sepsis, due to unspecified organism ([place]) [A41.9] Chest pain, unspecified type [R07.9]  DISCHARGE DIAGNOSIS:   Active Problems:    1. Acute on chronic hypoxic respiratory failure 2. Atrial fibrillation with rapid ventricular response 3. Asthma exacerbation   SECONDARY DIAGNOSIS:   Past Medical History:  Diagnosis Date  . Arthritis    wrist and knees  . Asthma   . Cancer ([place])    skin cancer  . Carpal tunnel syndrome   . Chicken pox   . Degenerative arthritis of knee, bilateral   . Diverticulitis   . Diverticulosis   . Dysrhythmia    afib  . GERD (gastroesophageal reflux disease)   . Hernia, umbilical   . Hyperlipidemia   . Hypertension   . Numbness and tingling    arm to leg  . Osteoporosis   . Pneumonia   . Shortness of breath    only with astma attacks  . Sleep apnea     HOSPITAL COURSE:   75 year old female with past medical history significant for paroxysmal atrial fibrillation, asthma, arthritis, GERD, hypertension presents to hospital secondary to worsening shortness of breath.  #1 Acute on chronic hypoxic respiratory failure-secondary to asthma, CHF exacerbation and pneumonia/bronchitis. -off Bipap and high flow, weaned off o2 last evening, did not qualify for home o2. - continue on CPAP at bedtime -continue incentive spirometry -appreciate pulmonary consult. -change tooral steroids today -finsihed antibiotics with Rocephin and azithromycin. Follow-up chest x-ray with bibasilar effusions  #2 atrial fibrillation with rapid ventricular response- likely triggered by respiratory status. -History of paroxysmal atrial  fibrillation -Converted to normal sinus rhythm -Appreciate cardiology input.  - stop digoxin at discharge as EF normal, it was just started in the hospital. -on oral Cardizem and metoprolol. -Remains on warfarin and INR is therapeutic  #3 elevated troponin-likely demand ischemia  #4 HTN- continue cardizem, metoprolol. Decrease HCTZ dose and continue home potassium supplements and magnesium supplements  Will be discharged home and home health services will be provided   DISCHARGE CONDITIONS:   Guarded CONSULTS OBTAINED:   Treatment Team:  Corey Skains, MD Flora Lipps, MD Teodoro Spray, MD Gladstone Lighter, MD  DRUG ALLERGIES:   Allergies  Allergen Reactions  . Amoxicillin Other (See Comments)    Lips swelling, tingling  . Benzocaine-Menthol     Throat swelling  . Biafine [Wound Dressings] Swelling  . Chloraseptic Sore Throat [Acetaminophen] Other (See Comments)    throat swelling  . Fosamax [Alendronate Sodium]   . Neosporin [Neomycin-Bacitracin Zn-Polymyx] Other (See Comments)    Skin redness, puffy  . Other     "chlorotrimitron"-- rxn: throat swelling  . Oxycodone Other (See Comments)    Dizziness, lips tingling  . Statins Other (See Comments)    Muscle weakness, weak  . Tape Other (See Comments)    Skin redness   DISCHARGE MEDICATIONS:     Medication List    TAKE these medications   acetaminophen 325 MG tablet Commonly known as:  TYLENOL Take 650 mg by mouth every 6 (six) hours as needed for mild pain, moderate pain or headache.   albuterol 108 (90 Base) MCG/ACT inhaler  Commonly known as:  PROVENTIL HFA;VENTOLIN HFA Inhale 2 puffs into the lungs every 6 (six) hours as needed. For shortness of breath   bisacodyl 5 MG EC tablet Generic drug:  bisacodyl Take 5 mg by mouth daily as needed for moderate constipation.   CITRACAL PO Take 1 tablet by mouth 2 (two) times daily.   diltiazem 300 MG 24 hr capsule Commonly known as:  CARDIZEM CD Take  300 mg by mouth daily.   diltiazem 60 MG tablet Commonly known as:  CARDIZEM Take 120 mg by mouth every 6 (six) hours as needed. Takes if has palpitation after taking diltiazem 300mg  dose   ezetimibe 10 MG tablet Commonly known as:  ZETIA Take 10 mg by mouth at bedtime.   fluticasone 50 MCG/ACT nasal spray Commonly known as:  FLONASE Place 1 spray into both nostrils 2 (two) times daily.   hydrochlorothiazide 12.5 MG tablet Commonly known as:  HYDRODIURIL Take 1 tablet (12.5 mg total) by mouth daily. What changed:  medication strength  how much to take   ipratropium-albuterol 0.5-2.5 (3) MG/3ML Soln Commonly known as:  DUONEB Take 3 mLs by nebulization every 6 (six) hours as needed.   loratadine 10 MG tablet Commonly known as:  CLARITIN Take 10 mg by mouth daily.   magnesium oxide 400 MG tablet Commonly known as:  MAG-OX Take 400 mg by mouth daily.   MEGARED OMEGA-3 KRILL OIL 500 MG Caps Take 1 capsule by mouth daily.   metoprolol tartrate 25 MG tablet Commonly known as:  LOPRESSOR Take 12.5 mg by mouth 2 (two) times daily.   mometasone-formoterol 200-5 MCG/ACT Aero Commonly known as:  DULERA Inhale 2 puffs into the lungs 2 (two) times daily.   montelukast 10 MG tablet Commonly known as:  SINGULAIR Take 10 mg by mouth at bedtime.   multivitamin with minerals Tabs tablet Take 1 tablet by mouth daily.   potassium chloride SA 20 MEQ tablet Commonly known as:  K-DUR,KLOR-CON Take 1 tablet (20 mEq total) by mouth 2 (two) times daily. What changed:  when to take this   predniSONE 10 MG (21) Tbpk tablet Commonly known as:  STERAPRED UNI-PAK 21 TAB Take 1 tablet (10 mg total) by mouth daily. 6 tabs PO x 1 day 5 tabs PO x 1 day 4 tabs PO x 1 day 3 tabs PO x 1 day 2 tabs PO x 1 day 1 tab PO x 1 day and stop   PRESERVISION AREDS PO Take 1 tablet by mouth daily.   vitamin B-12 1000 MCG tablet Commonly known as:  CYANOCOBALAMIN Take 3,000 mcg by mouth daily.    Vitamin D3 5000 units Caps Take 1 capsule by mouth daily.   warfarin 2 MG tablet Commonly known as:  COUMADIN Take 1 tablet (2 mg total) by mouth daily. What changed:  how much to take        DISCHARGE INSTRUCTIONS:   1. PCP f/u in 1-2 weeks 2. Cardiology f/u in 2 weeks  DIET:   Cardiac diet  ACTIVITY:   Activity as tolerated  OXYGEN:   Home Oxygen: No.  Oxygen Delivery: room air  DISCHARGE LOCATION:   home   If you experience worsening of your admission symptoms, develop shortness of breath, life threatening emergency, suicidal or homicidal thoughts you must seek medical attention immediately by calling 911 or calling your MD immediately  if symptoms less severe.  You Must read complete instructions/literature along with all the possible adverse reactions/side effects for  all the Medicines you take and that have been prescribed to you. Take any new Medicines after you have completely understood and accpet all the possible adverse reactions/side effects.   Please note  You were cared for by a hospitalist during your hospital stay. If you have any questions about your discharge medications or the care you received while you were in the hospital after you are discharged, you can call the unit and asked to speak with the hospitalist on call if the hospitalist that took care of you is not available. Once you are discharged, your primary care physician will handle any further medical issues. Please note that NO REFILLS for any discharge medications will be authorized once you are discharged, as it is imperative that you return to your primary care physician (or establish a relationship with a primary care physician if you do not have one) for your aftercare needs so that they can reassess your need for medications and monitor your lab values.    On the day of Discharge:  VITAL SIGNS:   Blood pressure 136/70, pulse 67, temperature 98.2 F (36.8 C), temperature source Oral,  resp. rate 18, height 5\' 5"  (1.651 m), weight 124.4 kg (274 lb 4 oz), SpO2 90 %.  PHYSICAL EXAMINATION:    GENERAL:  75 y.o.-year-old patient lying in the bed with no acute distress.  EYES: Pupils equal, round, reactive to light and accommodation. No scleral icterus. Extraocular muscles intact.  HEENT: Head atraumatic, normocephalic. Oropharynx and nasopharynx clear.  NECK:  Supple, no jugular venous distention. No thyroid enlargement, no tenderness.  LUNGS: Breath sounds much better bilaterally, improved wheezing, no  rales,rhonchi or crepitation. No use of accessory muscles of respiration. Decreased at the bases. CARDIOVASCULAR: S1, S2 normal. irregular. No murmurs, rubs, or gallops.  ABDOMEN: Soft, nontender, nondistended. Bowel sounds present. No organomegaly or mass.  EXTREMITIES: No pedal edema, cyanosis, or clubbing.  NEUROLOGIC: Cranial nerves II through XII are intact. Muscle strength 5/5 in all extremities. Sensation intact. Gait not checked.  PSYCHIATRIC: The patient is alert and oriented x 3.  SKIN: No obvious rash, lesion, or ulcer.  DATA REVIEW:   CBC  Recent Labs Lab 03/28/16 0429  WBC 14.3*  HGB 13.2  HCT 39.4  PLT 218    Chemistries   Recent Labs Lab 03/29/16 0439 03/31/16 0508  NA 141 140  K 4.5 4.5  CL 102 103  CO2 31 31  GLUCOSE 199* 160*  BUN 38* 35*  CREATININE 0.71 0.71  CALCIUM 9.3 8.8*  MG 2.1  --      Microbiology Results  Results for orders placed or performed during the hospital encounter of 03/24/16  Blood culture (routine x 2)     Status: None   Collection Time: 03/24/16 11:50 AM  Result Value Ref Range Status   Specimen Description BLOOD LEFT AC  Final   Special Requests BOTTLES DRAWN AEROBIC AND ANAEROBIC 4ML  Final   Culture NO GROWTH 5 DAYS  Final   Report Status 03/29/2016 FINAL  Final  Blood culture (routine x 2)     Status: None   Collection Time: 03/24/16 11:50 AM  Result Value Ref Range Status   Specimen Description  BLOOD RIGHT HAND  Final   Special Requests   Final    BOTTLES DRAWN AEROBIC AND ANAEROBIC AER 7ML ANA 6ML   Culture NO GROWTH 5 DAYS  Final   Report Status 03/29/2016 FINAL  Final  Urine culture     Status:  Abnormal   Collection Time: 03/24/16  2:09 PM  Result Value Ref Range Status   Specimen Description URINE, CLEAN CATCH  Final   Special Requests NONE  Final   Culture >=100,000 COLONIES/mL ESCHERICHIA COLI (A)  Final   Report Status 03/26/2016 FINAL  Final   Organism ID, Bacteria ESCHERICHIA COLI (A)  Final      Susceptibility   Escherichia coli - MIC*    AMPICILLIN 8 SENSITIVE Sensitive     CEFAZOLIN <=4 SENSITIVE Sensitive     CEFTRIAXONE <=1 SENSITIVE Sensitive     CIPROFLOXACIN >=4 RESISTANT Resistant     GENTAMICIN <=1 SENSITIVE Sensitive     IMIPENEM <=0.25 SENSITIVE Sensitive     NITROFURANTOIN 32 SENSITIVE Sensitive     TRIMETH/SULFA <=20 SENSITIVE Sensitive     AMPICILLIN/SULBACTAM 4 SENSITIVE Sensitive     PIP/TAZO <=4 SENSITIVE Sensitive     Extended ESBL NEGATIVE Sensitive     * >=100,000 COLONIES/mL ESCHERICHIA COLI  MRSA PCR Screening     Status: None   Collection Time: 03/24/16  5:20 PM  Result Value Ref Range Status   MRSA by PCR NEGATIVE NEGATIVE Final    Comment:        The GeneXpert MRSA Assay (FDA approved for NASAL specimens only), is one component of a comprehensive MRSA colonization surveillance program. It is not intended to diagnose MRSA infection nor to guide or monitor treatment for MRSA infections.     RADIOLOGY:  No results found.   Management plans discussed with the patient, family and they are in agreement.  CODE STATUS:     Code Status Orders        Start     Ordered   03/24/16 1507  Full code  Continuous     03/24/16 1511    Code Status History    Date Active Date Inactive Code Status Order ID Comments User Context   12/12/2014  3:01 AM 12/20/2014  7:58 PM Full Code [id number]  Lance Coon, MD Inpatient      TOTAL  TIME TAKING CARE OF THIS PATIENT: 38 minutes.    Gladstone Lighter M.D on 04/01/2016 at 8:52 AM  Between 7am to 6pm - Pager - [phone]  After 6pm go to [email or web address] - Proofreader  [facility]  Office  [phone]  CC: Primary care physician; [name], MD   Note: This dictation was prepared with Dragon dictation along with smaller phrase technology. Any transcriptional errors that result from this process are unintentional.

## 2016-04-01 NOTE — Care Management Important Message (Signed)
Important Message  Patient Details  Name: Laura Gonzalez MRN: [id number] Date of Birth: 1941/03/26   Medicare Important Message Given:  Yes    Katrina Stack, RN 04/01/2016, 9:20 AM

## 2016-04-01 NOTE — Clinical Social Work Note (Signed)
Patient was seen by PT yesterday and the recommendation was changed to home health.  Case manager is aware and will set up home health for patient.  MSW spoke with patient and asked if she feels confident and comfortable going home with home health and she stated she does and the physician answered all the questions that she had.  MSW informed her that the case manager has set up a home health social worker to see her as well in case she gets home and feels like she does need to go to SNF.  Patient expressed appreciation for social worker's help, MSW to sign off please reconsult if other social work needs arise.  Laura Gonzalez. Laura Gonzalez, MSW [phone]  Mon-Fri 8a-4:30p 04/01/2016 9:43 AM

## 2016-09-23 ENCOUNTER — Emergency Department: Payer: Medicare Other

## 2016-09-23 ENCOUNTER — Inpatient Hospital Stay
Admission: EM | Admit: 2016-09-23 | Discharge: 2016-09-26 | DRG: 871 | Disposition: A | Discharge status: Home Health | Payer: Medicare Other | Attending: Internal Medicine | Admitting: Internal Medicine

## 2016-09-23 ENCOUNTER — Encounter: Payer: Self-pay | Admitting: *Deleted

## 2016-09-23 DIAGNOSIS — Z7951 Long term (current) use of inhaled steroids: Secondary | ICD-10-CM

## 2016-09-23 DIAGNOSIS — Z7982 Long term (current) use of aspirin: Secondary | ICD-10-CM

## 2016-09-23 DIAGNOSIS — J209 Acute bronchitis, unspecified: Secondary | ICD-10-CM | POA: Diagnosis present

## 2016-09-23 DIAGNOSIS — Z888 Allergy status to other drugs, medicaments and biological substances status: Secondary | ICD-10-CM | POA: Diagnosis not present

## 2016-09-23 DIAGNOSIS — Z91048 Other nonmedicinal substance allergy status: Secondary | ICD-10-CM

## 2016-09-23 DIAGNOSIS — I10 Essential (primary) hypertension: Secondary | ICD-10-CM | POA: Diagnosis present

## 2016-09-23 DIAGNOSIS — M810 Age-related osteoporosis without current pathological fracture: Secondary | ICD-10-CM | POA: Diagnosis present

## 2016-09-23 DIAGNOSIS — J9601 Acute respiratory failure with hypoxia: Secondary | ICD-10-CM | POA: Diagnosis present

## 2016-09-23 DIAGNOSIS — Z808 Family history of malignant neoplasm of other organs or systems: Secondary | ICD-10-CM | POA: Diagnosis not present

## 2016-09-23 DIAGNOSIS — Z8249 Family history of ischemic heart disease and other diseases of the circulatory system: Secondary | ICD-10-CM | POA: Diagnosis not present

## 2016-09-23 DIAGNOSIS — Z96651 Presence of right artificial knee joint: Secondary | ICD-10-CM | POA: Diagnosis present

## 2016-09-23 DIAGNOSIS — Z881 Allergy status to other antibiotic agents status: Secondary | ICD-10-CM | POA: Diagnosis not present

## 2016-09-23 DIAGNOSIS — M19039 Primary osteoarthritis, unspecified wrist: Secondary | ICD-10-CM | POA: Diagnosis present

## 2016-09-23 DIAGNOSIS — Z803 Family history of malignant neoplasm of breast: Secondary | ICD-10-CM | POA: Diagnosis not present

## 2016-09-23 DIAGNOSIS — E785 Hyperlipidemia, unspecified: Secondary | ICD-10-CM | POA: Diagnosis present

## 2016-09-23 DIAGNOSIS — Z981 Arthrodesis status: Secondary | ICD-10-CM

## 2016-09-23 DIAGNOSIS — Z85828 Personal history of other malignant neoplasm of skin: Secondary | ICD-10-CM

## 2016-09-23 DIAGNOSIS — Z885 Allergy status to narcotic agent status: Secondary | ICD-10-CM

## 2016-09-23 DIAGNOSIS — K219 Gastro-esophageal reflux disease without esophagitis: Secondary | ICD-10-CM | POA: Diagnosis present

## 2016-09-23 DIAGNOSIS — R000 Tachycardia, unspecified: Secondary | ICD-10-CM | POA: Diagnosis present

## 2016-09-23 DIAGNOSIS — Z79899 Other long term (current) drug therapy: Secondary | ICD-10-CM

## 2016-09-23 DIAGNOSIS — J4521 Mild intermittent asthma with (acute) exacerbation: Secondary | ICD-10-CM | POA: Diagnosis present

## 2016-09-23 DIAGNOSIS — Z800 Family history of malignant neoplasm of digestive organs: Secondary | ICD-10-CM

## 2016-09-23 DIAGNOSIS — M1710 Unilateral primary osteoarthritis, unspecified knee: Secondary | ICD-10-CM | POA: Diagnosis present

## 2016-09-23 DIAGNOSIS — Z7901 Long term (current) use of anticoagulants: Secondary | ICD-10-CM | POA: Diagnosis not present

## 2016-09-23 DIAGNOSIS — R6520 Severe sepsis without septic shock: Secondary | ICD-10-CM | POA: Diagnosis present

## 2016-09-23 DIAGNOSIS — E782 Mixed hyperlipidemia: Secondary | ICD-10-CM | POA: Diagnosis present

## 2016-09-23 DIAGNOSIS — I4891 Unspecified atrial fibrillation: Secondary | ICD-10-CM

## 2016-09-23 DIAGNOSIS — I482 Chronic atrial fibrillation: Secondary | ICD-10-CM | POA: Diagnosis present

## 2016-09-23 DIAGNOSIS — Z6841 Body Mass Index (BMI) 40.0 and over, adult: Secondary | ICD-10-CM | POA: Diagnosis not present

## 2016-09-23 DIAGNOSIS — J189 Pneumonia, unspecified organism: Secondary | ICD-10-CM | POA: Diagnosis present

## 2016-09-23 DIAGNOSIS — G5600 Carpal tunnel syndrome, unspecified upper limb: Secondary | ICD-10-CM | POA: Diagnosis present

## 2016-09-23 DIAGNOSIS — Z8261 Family history of arthritis: Secondary | ICD-10-CM

## 2016-09-23 DIAGNOSIS — A419 Sepsis, unspecified organism: Principal | ICD-10-CM | POA: Diagnosis present

## 2016-09-23 DIAGNOSIS — G4733 Obstructive sleep apnea (adult) (pediatric): Secondary | ICD-10-CM | POA: Diagnosis present

## 2016-09-23 LAB — CBC WITH DIFFERENTIAL/PLATELET
Basophils Absolute: 0.1 10*3/uL (ref 0–0.1)
Basophils Relative: 1 %
Eosinophils Absolute: 0 10*3/uL (ref 0–0.7)
Eosinophils Relative: 0 %
HCT: 48.2 % — ABNORMAL HIGH (ref 35.0–47.0)
HEMOGLOBIN: 16.2 g/dL — AB (ref 12.0–16.0)
LYMPHS ABS: 1.1 10*3/uL (ref 1.0–3.6)
LYMPHS PCT: 8 %
MCH: 31.2 pg (ref 26.0–34.0)
MCHC: 33.7 g/dL (ref 32.0–36.0)
MCV: 92.7 fL (ref 80.0–100.0)
MONOS PCT: 11 %
Monocytes Absolute: 1.5 10*3/uL — ABNORMAL HIGH (ref 0.2–0.9)
NEUTROS ABS: 10.3 10*3/uL — AB (ref 1.4–6.5)
NEUTROS PCT: 80 %
Platelets: 206 10*3/uL (ref 150–440)
RBC: 5.21 MIL/uL — AB (ref 3.80–5.20)
RDW: 14.2 % (ref 11.5–14.5)
WBC: 13 10*3/uL — AB (ref 3.6–11.0)

## 2016-09-23 LAB — COMPREHENSIVE METABOLIC PANEL
ALK PHOS: 69 U/L (ref 38–126)
ALT: 32 U/L (ref 14–54)
ANION GAP: 9 (ref 5–15)
AST: 41 U/L (ref 15–41)
Albumin: 3.9 g/dL (ref 3.5–5.0)
BUN: 18 mg/dL (ref 6–20)
CALCIUM: 9.5 mg/dL (ref 8.9–10.3)
CHLORIDE: 102 mmol/L (ref 101–111)
CO2: 28 mmol/L (ref 22–32)
Creatinine, Ser: 1.04 mg/dL — ABNORMAL HIGH (ref 0.44–1.00)
GFR calc non Af Amer: 52 mL/min — ABNORMAL LOW (ref >60)
GFR, EST AFRICAN AMERICAN: 60 mL/min — AB (ref >60)
GLUCOSE: 137 mg/dL — AB (ref 65–99)
POTASSIUM: 4.1 mmol/L (ref 3.5–5.1)
SODIUM: 139 mmol/L (ref 135–145)
Total Bilirubin: 0.8 mg/dL (ref 0.3–1.2)
Total Protein: 7.8 g/dL (ref 6.5–8.1)

## 2016-09-23 LAB — URINALYSIS, COMPLETE (UACMP) WITH MICROSCOPIC
Bacteria, UA: NONE SEEN
Bilirubin Urine: NEGATIVE
GLUCOSE, UA: NEGATIVE mg/dL
HGB URINE DIPSTICK: NEGATIVE
Ketones, ur: 5 mg/dL — AB
NITRITE: NEGATIVE
PH: 5 (ref 5.0–8.0)
Protein, ur: 30 mg/dL — AB
SPECIFIC GRAVITY, URINE: 1.021 (ref 1.005–1.030)

## 2016-09-23 LAB — PROTIME-INR
INR: 1.58
Prothrombin Time: 19 seconds — ABNORMAL HIGH (ref 11.4–15.2)

## 2016-09-23 LAB — LACTIC ACID, PLASMA
LACTIC ACID, VENOUS: 1.6 mmol/L (ref 0.5–1.9)
Lactic Acid, Venous: 2.3 mmol/L (ref 0.5–1.9)

## 2016-09-23 LAB — TROPONIN I: Troponin I: <0.03 ng/mL (ref <0.03)

## 2016-09-23 LAB — INFLUENZA PANEL BY PCR (TYPE A & B)
Influenza A By PCR: NEGATIVE
Influenza B By PCR: NEGATIVE

## 2016-09-23 LAB — MAGNESIUM: MAGNESIUM: 2 mg/dL (ref 1.7–2.4)

## 2016-09-23 MED ORDER — LORATADINE 10 MG PO TABS
10.0000 mg | ORAL_TABLET | Freq: Every day | ORAL | Status: DC
  Administered 2016-09-24 – 2016-09-26 (×3): 10 mg via ORAL
  Filled 2016-09-23 (×3): qty 1

## 2016-09-23 MED ORDER — ACETAMINOPHEN 325 MG PO TABS: 650.0000 mg | ORAL_TABLET | Freq: Four times a day (QID) | ORAL | Status: DC | PRN

## 2016-09-23 MED ORDER — FLUTICASONE PROPIONATE 50 MCG/ACT NA SUSP
1.0000 | Freq: Every day | NASAL | Status: DC
  Administered 2016-09-24 – 2016-09-26 (×3): 1 via NASAL
  Filled 2016-09-23: qty 16

## 2016-09-23 MED ORDER — BISACODYL 5 MG PO TBEC: 5.0000 mg | DELAYED_RELEASE_TABLET | Freq: Every day | ORAL | Status: DC | PRN

## 2016-09-23 MED ORDER — SODIUM CHLORIDE 0.9% FLUSH
3.0000 mL | Freq: Two times a day (BID) | INTRAVENOUS | Status: DC
  Administered 2016-09-24 – 2016-09-25 (×4): 3 mL via INTRAVENOUS

## 2016-09-23 MED ORDER — MOMETASONE FURO-FORMOTEROL FUM 200-5 MCG/ACT IN AERO
2.0000 | INHALATION_SPRAY | Freq: Two times a day (BID) | RESPIRATORY_TRACT | Status: DC
  Administered 2016-09-23 – 2016-09-26 (×6): 2 via RESPIRATORY_TRACT
  Filled 2016-09-23: qty 8.8

## 2016-09-23 MED ORDER — HYDROCODONE-ACETAMINOPHEN 5-325 MG PO TABS
1.0000 | ORAL_TABLET | ORAL | Status: DC | PRN
  Administered 2016-09-25: 1 via ORAL
  Filled 2016-09-23: qty 1

## 2016-09-23 MED ORDER — METOPROLOL TARTRATE 25 MG PO TABS
12.5000 mg | ORAL_TABLET | Freq: Two times a day (BID) | ORAL | Status: DC
  Administered 2016-09-23 – 2016-09-26 (×6): 12.5 mg via ORAL
  Filled 2016-09-23 (×6): qty 1

## 2016-09-23 MED ORDER — DEXTROSE 5 % IV SOLN
500.0000 mg | Freq: Once | INTRAVENOUS | Status: AC
  Administered 2016-09-23: 500 mg via INTRAVENOUS
  Filled 2016-09-23: qty 500

## 2016-09-23 MED ORDER — SENNOSIDES-DOCUSATE SODIUM 8.6-50 MG PO TABS: 1.0000 | ORAL_TABLET | Freq: Every evening | ORAL | Status: DC | PRN

## 2016-09-23 MED ORDER — DEXTROSE 5 % IV SOLN
1.0000 g | Freq: Once | INTRAVENOUS | Status: AC
  Administered 2016-09-23: 1 g via INTRAVENOUS
  Filled 2016-09-23: qty 10

## 2016-09-23 MED ORDER — AZITHROMYCIN 250 MG PO TABS
500.0000 mg | ORAL_TABLET | Freq: Every day | ORAL | Status: DC
  Administered 2016-09-24 – 2016-09-26 (×3): 500 mg via ORAL
  Filled 2016-09-23 (×3): qty 2

## 2016-09-23 MED ORDER — DEXTROSE 5 % IV SOLN
2.0000 g | INTRAVENOUS | Status: DC
  Administered 2016-09-24 – 2016-09-25 (×2): 2 g via INTRAVENOUS
  Filled 2016-09-23 (×3): qty 2

## 2016-09-23 MED ORDER — DILTIAZEM HCL 100 MG IV SOLR
5.0000 mg/h | Freq: Once | INTRAVENOUS | Status: AC
  Administered 2016-09-23: 5 mg/h via INTRAVENOUS
  Filled 2016-09-23: qty 100

## 2016-09-23 MED ORDER — VITAMIN D 1000 UNITS PO TABS
5000.0000 [IU] | ORAL_TABLET | Freq: Every day | ORAL | Status: DC
  Administered 2016-09-24 – 2016-09-26 (×3): 5000 [IU] via ORAL
  Filled 2016-09-23 (×3): qty 5

## 2016-09-23 MED ORDER — DILTIAZEM HCL 100 MG IV SOLR
5.0000 mg/h | INTRAVENOUS | Status: DC
  Administered 2016-09-23 – 2016-09-24 (×3): 7.5 mg/h via INTRAVENOUS
  Filled 2016-09-23 (×2): qty 100

## 2016-09-23 MED ORDER — ENOXAPARIN SODIUM 40 MG/0.4ML ~~LOC~~ SOLN
40.0000 mg | Freq: Two times a day (BID) | SUBCUTANEOUS | Status: DC
  Administered 2016-09-23 – 2016-09-24 (×2): 40 mg via SUBCUTANEOUS
  Filled 2016-09-23 (×2): qty 0.4

## 2016-09-23 MED ORDER — ADULT MULTIVITAMIN W/MINERALS CH
2.0000 | ORAL_TABLET | Freq: Every day | ORAL | Status: DC
  Administered 2016-09-24 – 2016-09-26 (×3): 2 via ORAL
  Filled 2016-09-23 (×3): qty 2

## 2016-09-23 MED ORDER — MONTELUKAST SODIUM 10 MG PO TABS
10.0000 mg | ORAL_TABLET | Freq: Every day | ORAL | Status: DC
  Administered 2016-09-23 – 2016-09-25 (×3): 10 mg via ORAL
  Filled 2016-09-23 (×3): qty 1

## 2016-09-23 MED ORDER — METHYLPREDNISOLONE SODIUM SUCC 125 MG IJ SOLR
60.0000 mg | Freq: Once | INTRAMUSCULAR | Status: AC
  Administered 2016-09-23 (×2): 60 mg via INTRAVENOUS

## 2016-09-23 MED ORDER — RISAQUAD PO CAPS
1.0000 | ORAL_CAPSULE | Freq: Every day | ORAL | Status: DC
  Administered 2016-09-24 – 2016-09-26 (×3): 1 via ORAL
  Filled 2016-09-23 (×4): qty 1

## 2016-09-23 MED ORDER — DILTIAZEM HCL ER COATED BEADS 180 MG PO CP24
300.0000 mg | ORAL_CAPSULE | Freq: Every day | ORAL | Status: DC
  Administered 2016-09-24 – 2016-09-26 (×3): 300 mg via ORAL
  Filled 2016-09-23 (×4): qty 1

## 2016-09-23 MED ORDER — IPRATROPIUM-ALBUTEROL 0.5-2.5 (3) MG/3ML IN SOLN
3.0000 mL | Freq: Once | RESPIRATORY_TRACT | Status: AC
  Administered 2016-09-23: 3 mL via RESPIRATORY_TRACT
  Filled 2016-09-23: qty 3

## 2016-09-23 MED ORDER — WARFARIN - PHYSICIAN DOSING INPATIENT: Freq: Every day | Status: DC

## 2016-09-23 MED ORDER — ACETAMINOPHEN 650 MG RE SUPP: 650.0000 mg | Freq: Four times a day (QID) | RECTAL | Status: DC | PRN

## 2016-09-23 MED ORDER — EZETIMIBE 10 MG PO TABS
10.0000 mg | ORAL_TABLET | Freq: Every day | ORAL | Status: DC
  Administered 2016-09-24 – 2016-09-26 (×3): 10 mg via ORAL
  Filled 2016-09-23 (×3): qty 1

## 2016-09-23 MED ORDER — VITAMIN B-12 1000 MCG PO TABS
3000.0000 ug | ORAL_TABLET | Freq: Every day | ORAL | Status: DC
  Administered 2016-09-24 – 2016-09-26 (×3): 3000 ug via ORAL
  Filled 2016-09-23 (×3): qty 3

## 2016-09-23 MED ORDER — SODIUM CHLORIDE 0.9 % IV BOLUS (SEPSIS)
2000.0000 mL | Freq: Once | INTRAVENOUS | Status: AC
  Administered 2016-09-23: 2000 mL via INTRAVENOUS

## 2016-09-23 MED ORDER — FLEET ENEMA 7-19 GM/118ML RE ENEM: 1.0000 | ENEMA | Freq: Once | RECTAL | Status: DC | PRN

## 2016-09-23 MED ORDER — ONDANSETRON HCL 4 MG PO TABS: 4.0000 mg | ORAL_TABLET | Freq: Four times a day (QID) | ORAL | Status: DC | PRN

## 2016-09-23 MED ORDER — WARFARIN SODIUM 1 MG PO TABS
4.0000 mg | ORAL_TABLET | Freq: Every day | ORAL | Status: DC
  Administered 2016-09-23 – 2016-09-26 (×4): 4 mg via ORAL
  Filled 2016-09-23 (×4): qty 4

## 2016-09-23 MED ORDER — CEFTRIAXONE SODIUM-DEXTROSE 1-3.74 GM-% IV SOLR
1.0000 g | Freq: Once | INTRAVENOUS | Status: AC
  Administered 2016-09-23: 1 g via INTRAVENOUS
  Filled 2016-09-23: qty 50

## 2016-09-23 MED ORDER — ASPIRIN EC 81 MG PO TBEC
81.0000 mg | DELAYED_RELEASE_TABLET | Freq: Every day | ORAL | Status: DC
  Administered 2016-09-24 – 2016-09-26 (×3): 81 mg via ORAL
  Filled 2016-09-23 (×3): qty 1

## 2016-09-23 MED ORDER — METHYLPREDNISOLONE SODIUM SUCC 125 MG IJ SOLR
INTRAMUSCULAR | Status: AC
  Administered 2016-09-23: 60 mg via INTRAVENOUS
  Filled 2016-09-23: qty 2

## 2016-09-23 MED ORDER — DEXTROSE 5 % IV SOLN: 1.0000 g | Freq: Once | INTRAVENOUS | Status: DC

## 2016-09-23 MED ORDER — OCUVITE-LUTEIN PO CAPS
ORAL_CAPSULE | Freq: Every day | ORAL | Status: DC
  Administered 2016-09-24 – 2016-09-26 (×3): 1 via ORAL
  Filled 2016-09-23 (×3): qty 1

## 2016-09-23 MED ORDER — HYDROCHLOROTHIAZIDE 25 MG PO TABS
12.5000 mg | ORAL_TABLET | Freq: Every day | ORAL | Status: DC
  Administered 2016-09-24: 12.5 mg via ORAL
  Filled 2016-09-23: qty 1

## 2016-09-23 MED ORDER — OMEGA-3-ACID ETHYL ESTERS 1 G PO CAPS
1.0000 | ORAL_CAPSULE | Freq: Every day | ORAL | Status: DC
Start: 2016-09-24 — End: 2016-09-26
  Administered 2016-09-24 – 2016-09-26 (×3): 1 g via ORAL
  Filled 2016-09-23 (×3): qty 1

## 2016-09-23 MED ORDER — DILTIAZEM HCL 25 MG/5ML IV SOLN
10.0000 mg | Freq: Once | INTRAVENOUS | Status: AC
  Administered 2016-09-23: 10 mg via INTRAVENOUS
  Filled 2016-09-23: qty 5

## 2016-09-23 MED ORDER — SODIUM CHLORIDE 0.9 % IV SOLN
INTRAVENOUS | Status: DC
  Administered 2016-09-23 – 2016-09-24 (×2): via INTRAVENOUS

## 2016-09-23 MED ORDER — MAGNESIUM SULFATE 2 GM/50ML IV SOLN
2.0000 g | Freq: Once | INTRAVENOUS | Status: AC
  Administered 2016-09-23: 2 g via INTRAVENOUS
  Filled 2016-09-23: qty 50

## 2016-09-23 MED ORDER — MAGNESIUM OXIDE 400 (241.3 MG) MG PO TABS
400.0000 mg | ORAL_TABLET | Freq: Every day | ORAL | Status: DC
  Administered 2016-09-24 – 2016-09-26 (×3): 400 mg via ORAL
  Filled 2016-09-23 (×3): qty 1

## 2016-09-23 MED ORDER — ALBUTEROL SULFATE (2.5 MG/3ML) 0.083% IN NEBU
2.5000 mg | INHALATION_SOLUTION | Freq: Four times a day (QID) | RESPIRATORY_TRACT | Status: DC | PRN
  Administered 2016-09-24 (×3): 2.5 mg via RESPIRATORY_TRACT
  Filled 2016-09-23 (×3): qty 3

## 2016-09-23 MED ORDER — DILTIAZEM HCL 60 MG PO TABS
60.0000 mg | ORAL_TABLET | Freq: Once | ORAL | Status: AC
  Administered 2016-09-23: 60 mg via ORAL
  Filled 2016-09-23: qty 1

## 2016-09-23 MED ORDER — ONDANSETRON HCL 4 MG/2ML IJ SOLN: 4.0000 mg | Freq: Four times a day (QID) | INTRAMUSCULAR | Status: DC | PRN

## 2016-09-23 NOTE — ED Notes (Signed)
Pt placed on 2L oxygen per MD verbal order.

## 2016-09-23 NOTE — Progress Notes (Signed)
Family Meeting Note  [facility] Directive:no  Today a meeting took place with the Patient.     The following clinical team members were present during this meeting:MD  The following were discussed:Patient's diagnosis: ASTHMA EXACERBATION ACUTE HYPOXIC RESP [place] FIB WITH rvr Cap   Patient's progosis: Unable to determine and Goals for treatment: LIMITED NO mv  Additional follow-up to be provided: [place]  Time spent during discussion:18 minutes  Geraldyne Barraclough, MD

## 2016-09-23 NOTE — ED Notes (Signed)
Admitting MD at bedside.

## 2016-09-23 NOTE — ED Provider Notes (Signed)
[facility] Emergency Department Provider Note    First MD Initiated Contact with Patient 09/23/16 1519     (approximate)  I have reviewed the triage vital signs and the nursing notes.   HISTORY  Chief Complaint Cough and Fever    HPI Laura Gonzalez is a 76 y.o. female with a history of asthma as well as A. fib on Coumadin presents with shortness of breath and cough since Monday with progression of fever to 100.7 started yesterday. Patient has been taking her home nebulizer treatments around the clock without any improvement. States that she started feeling weak and more short of breath today with a productive cough. She checked her heart rate on her home monitor and showed a heart rate in the 160s so she decided to come to the ER. On arrival she is surprisingly well-appearing. States that she feels "run down". She is not on any home oxygen. States that she takes her regular medications at night.   Past Medical History:  Diagnosis Date  . Arthritis    wrist and knees  . Asthma   . Cancer ([place])    skin cancer  . Carpal tunnel syndrome   . Chicken pox   . Degenerative arthritis of knee, bilateral   . Diverticulitis   . Diverticulosis   . Dysrhythmia    afib  . GERD (gastroesophageal reflux disease)   . Hernia, umbilical   . Hyperlipidemia   . Hypertension   . Numbness and tingling    arm to leg  . Osteoporosis   . Pneumonia   . Shortness of breath    only with astma attacks  . Sleep apnea    Family History  Problem Relation Age of Onset  . Pulmonary embolism Mother   . Arthritis Mother   . Hypertension Mother   . Heart attack Mother   . Breast cancer Mother 3  . Stomach cancer Maternal Aunt   . Throat cancer Maternal Uncle   . Stomach cancer Maternal Grandfather    Past Surgical History:  Procedure Laterality Date  . ANTERIOR CERVICAL DECOMP/DISCECTOMY FUSION N/A 08/09/2012   Procedure: ANTERIOR CERVICAL DECOMPRESSION/DISCECTOMY  FUSION 2 LEVELS;  Surgeon: [name], MD;  Location: [place] NEURO ORS;  Service: Neurosurgery;  Laterality: N/A;  C3-4 C4-5 Anterior cervical decompression/diskectomy/fusion/[employer] Bone/Trestle plate  . BREAST BIOPSY Left 1984   EXCISIONAL - NEG  . BREAST BIOPSY Left 1987   EXCISIONAL - NEG  . BREAST SURGERY    . COLONOSCOPY    . COLONOSCOPY WITH PROPOFOL N/A 07/29/2015   Procedure: COLONOSCOPY WITH PROPOFOL;  Surgeon: [name], MD;  Location: [facility] ENDOSCOPY;  Service: Endoscopy;  Laterality: N/A;  . colonscopy  2000,2007,2012  . ESOPHAGOGASTRODUODENOSCOPY    . HERNIA REPAIR    . JOINT REPLACEMENT Right    Total Knee Replacement  . LIPOMA EXCISION Right 2010   back  . MASTECTOMY PARTIAL / LUMPECTOMY Left 1980s  . ROTATOR CUFF REPAIR Bilateral right- 2009, left 2011  . SKIN CANCER EXCISION  2009   back of neck and right cheek  . TONSILLECTOMY  1960  . TRACHEOSTOMY    . UMBILICAL HERNIA REPAIR  [id number]  . VARICOSE VEIN SURGERY Left 04/2009 rt 2011  . [place]  . WRIST SURGERY Right 1998   external fixator   Patient Active Problem List   Diagnosis Date Noted  . Hypotension 03/24/2016  . Diverticulitis 12/11/2014  . Sepsis ([place]) 12/11/2014  .  Persistent atrial fibrillation ([place]) 12/11/2014  . HTN (hypertension) 12/11/2014  . GERD (gastroesophageal reflux disease) 12/11/2014  . Asthma 12/11/2014      Prior to Admission medications   Medication Sig Start Date End Date Taking? Authorizing Provider  acetaminophen (TYLENOL) 325 MG tablet Take 650 mg by mouth every 6 (six) hours as needed for mild pain, moderate pain or headache.   Yes Historical Provider, MD  acidophilus (RISAQUAD) CAPS capsule Take 1 capsule by mouth daily.   Yes Historical Provider, MD  albuterol (PROVENTIL HFA;VENTOLIN HFA) 108 (90 BASE) MCG/ACT inhaler Inhale 2 puffs into the lungs every 6 (six) hours as needed. For shortness of breath   Yes Historical Provider, MD  albuterol  (PROVENTIL) (2.5 MG/3ML) 0.083% nebulizer solution Take 2.5 mg by nebulization every 6 (six) hours as needed for wheezing or shortness of breath.   Yes Historical Provider, MD  Aloe-Sodium Chloride (AYR SALINE NASAL GEL NA) Place 1 application into the nose as needed.   Yes Historical Provider, MD  Alum Hydroxide-Mag Carbonate ([employer] HEARTBURN RELIEF PO) Take 1 tablet by mouth as needed.   Yes Historical Provider, MD  aspirin EC 81 MG tablet Take 81 mg by mouth daily.   Yes Historical Provider, MD  Calcium Citrate (CITRACAL PO) Take 2 tablets by mouth daily.    Yes Historical Provider, MD  Cholecalciferol (VITAMIN D3) 5000 UNITS CAPS Take 1 capsule by mouth daily.   Yes Historical Provider, MD  diltiazem (CARDIZEM CD) 300 MG 24 hr capsule Take 300 mg by mouth daily.   Yes Historical Provider, MD  diltiazem (CARDIZEM) 60 MG tablet Take 60 mg by mouth every 6 (six) hours as needed. Takes if has palpitation after taking diltiazem 300mg  dose    Yes Historical Provider, MD  diphenhydrAMINE (BENADRYL) 25 MG tablet Take 25 mg by mouth every 6 (six) hours as needed.   Yes Historical Provider, MD  ezetimibe (ZETIA) 10 MG tablet Take 10 mg by mouth daily.    Yes Historical Provider, MD  fluticasone (FLONASE) 50 MCG/ACT nasal spray Place 1 spray into both nostrils daily.    Yes Historical Provider, MD  hydrochlorothiazide (HYDRODIURIL) 12.5 MG tablet Take 1 tablet (12.5 mg total) by mouth daily. 04/01/16  Yes Gladstone Lighter, MD  loratadine (CLARITIN) 10 MG tablet Take 10 mg by mouth daily.   Yes Historical Provider, MD  magnesium oxide (MAG-OX) 400 MG tablet Take 400 mg by mouth daily.   Yes Historical Provider, MD  MEGARED OMEGA-3 KRILL OIL 500 MG CAPS Take 1 capsule by mouth daily.   Yes Historical Provider, MD  metoprolol tartrate (LOPRESSOR) 25 MG tablet Take 12.5 mg by mouth 2 (two) times daily.    Yes Historical Provider, MD  montelukast (SINGULAIR) 10 MG tablet Take 10 mg by mouth at bedtime.   Yes  Historical Provider, MD  Multiple Vitamin (MULTIVITAMIN WITH MINERALS) TABS Take 2 tablets by mouth daily.    Yes Historical Provider, MD  Multiple Vitamins-Minerals (PRESERVISION AREDS PO) Take 1 tablet by mouth daily.   Yes Historical Provider, MD  potassium chloride SA (K-DUR,KLOR-CON) 20 MEQ tablet Take 1 tablet (20 mEq total) by mouth 2 (two) times daily. Patient taking differently: Take 20 mEq by mouth 3 (three) times daily.  04/01/16  Yes Gladstone Lighter, MD  trolamine salicylate (ASPERCREME) 10 % cream Apply 1 application topically as needed for muscle pain.   Yes Historical Provider, MD  vitamin B-12 (CYANOCOBALAMIN) 1000 MCG tablet Take 3,000 mcg by mouth daily.  Yes Historical Provider, MD  warfarin (COUMADIN) 2 MG tablet Take 1 tablet (2 mg total) by mouth daily. Patient taking differently: Take 4 mg by mouth daily.  12/20/14  Yes Fritzi Mandes, MD  bisacodyl (BISACODYL) 5 MG EC tablet Take 5 mg by mouth daily as needed for moderate constipation.    Historical Provider, MD  mometasone-formoterol (DULERA) 200-5 MCG/ACT AERO Inhale 2 puffs into the lungs 2 (two) times daily. Patient not taking: Reported on 09/23/2016 04/01/16   Gladstone Lighter, MD    Allergies Amoxicillin; Benzocaine-menthol; Biafine [wound dressings]; Chloraseptic sore throat [acetaminophen]; Fosamax [alendronate sodium]; Neosporin [neomycin-bacitracin zn-polymyx]; Other; Oxycodone; Statins; and Tape    Social History Social History  Substance Use Topics  . Smoking status: Never Smoker  . Smokeless tobacco: Never Used  . Alcohol use No    Review of Systems Patient denies headaches, rhinorrhea, blurry vision, numbness, shortness of breath, chest pain, edema, cough, abdominal pain, nausea, vomiting, diarrhea, dysuria, fevers, rashes or hallucinations unless otherwise stated above in HPI. ____________________________________________   PHYSICAL EXAM:  VITAL SIGNS: Vitals:   09/23/16 1526  BP: 134/86    Pulse: (!) 149  Temp: 100.3 F (37.9 C)    Constitutional: Alert and oriented. acutely Ill appearing Eyes: Conjunctivae are normal. PERRL. EOMI. Head: Atraumatic. Nose: No congestion/rhinnorhea. Mouth/Throat: Mucous membranes are moist.  Oropharynx non-erythematous. Neck: No stridor. Painless ROM. No cervical spine tenderness to palpation Hematological/Lymphatic/Immunilogical: No cervical lymphadenopathy. Cardiovascular: tachycardic, irregularly irregular rhythm. Grossly normal heart sounds.  Good peripheral circulation. Respiratory: mild tachypnea, diffuse wheezing and rhonchi Gastrointestinal: Soft and nontender. No distention. No abdominal bruits. No CVA tenderness. Genitourinary:  Musculoskeletal: No lower extremity tenderness nor edema.  No joint effusions. Neurologic:  Normal speech and language. No gross focal neurologic deficits are appreciated. No gait instability. Skin:  Skin is warm, dry and intact. No rash noted. Psychiatric: Mood and affect are normal. Speech and behavior are normal.  ____________________________________________   LABS (all labs ordered are listed, but only abnormal results are displayed)  Results for orders placed or performed during the hospital encounter of 09/23/16 (from the past 24 hour(s))  Lactic acid, plasma     Status: Abnormal   Collection Time: 09/23/16  3:42 PM  Result Value Ref Range   Lactic Acid, Venous 2.3 (HH) 0.5 - 1.9 mmol/L  Comprehensive metabolic panel     Status: Abnormal   Collection Time: 09/23/16  3:42 PM  Result Value Ref Range   Sodium 139 135 - 145 mmol/L   Potassium 4.1 3.5 - 5.1 mmol/L   Chloride 102 101 - 111 mmol/L   CO2 28 22 - 32 mmol/L   Glucose, Bld 137 (H) 65 - 99 mg/dL   BUN 18 6 - 20 mg/dL   Creatinine, Ser 1.04 (H) 0.44 - 1.00 mg/dL   Calcium 9.5 8.9 - 10.3 mg/dL   Total Protein 7.8 6.5 - 8.1 g/dL   Albumin 3.9 3.5 - 5.0 g/dL   AST 41 15 - 41 U/L   ALT 32 14 - 54 U/L   Alkaline Phosphatase 69 38 -  126 U/L   Total Bilirubin 0.8 0.3 - 1.2 mg/dL   GFR calc non Af Amer 52 (L) >60 mL/min   GFR calc Af Amer 60 (L) >60 mL/min   Anion gap 9 5 - 15  Troponin I     Status: None   Collection Time: 09/23/16  3:42 PM  Result Value Ref Range   Troponin I <0.03 <0.03 ng/mL  CBC WITH DIFFERENTIAL     Status: Abnormal   Collection Time: 09/23/16  3:42 PM  Result Value Ref Range   WBC 13.0 (H) 3.6 - 11.0 K/uL   RBC 5.21 (H) 3.80 - 5.20 MIL/uL   Hemoglobin 16.2 (H) 12.0 - 16.0 g/dL   HCT 48.2 (H) 35.0 - 47.0 %   MCV 92.7 80.0 - 100.0 fL   MCH 31.2 26.0 - 34.0 pg   MCHC 33.7 32.0 - 36.0 g/dL   RDW 14.2 11.5 - 14.5 %   Platelets 206 150 - 440 K/uL   Neutrophils Relative % 80 %   Neutro Abs 10.3 (H) 1.4 - 6.5 K/uL   Lymphocytes Relative 8 %   Lymphs Abs 1.1 1.0 - 3.6 K/uL   Monocytes Relative 11 %   Monocytes Absolute 1.5 (H) 0.2 - 0.9 K/uL   Eosinophils Relative 0 %   Eosinophils Absolute 0.0 0 - 0.7 K/uL   Basophils Relative 1 %   Basophils Absolute 0.1 0 - 0.1 K/uL  Protime-INR     Status: Abnormal   Collection Time: 09/23/16  3:42 PM  Result Value Ref Range   Prothrombin Time 19.0 (H) 11.4 - 15.2 seconds   INR 1.58   Influenza panel by PCR (type A & B)     Status: None   Collection Time: 09/23/16  3:42 PM  Result Value Ref Range   Influenza A By PCR NEGATIVE NEGATIVE   Influenza B By PCR NEGATIVE NEGATIVE  Magnesium     Status: None   Collection Time: 09/23/16  3:42 PM  Result Value Ref Range   Magnesium 2.0 1.7 - 2.4 mg/dL   ____________________________________________  EKG My review and personal interpretation at Time: 15:24   Indication: sob  Rate: 160  Rhythm: afib with rvr Axis: normal Other: non specific st changes likely rate dependent, normal intervals ____________________________________________  RADIOLOGY  I personally reviewed all radiographic images ordered to evaluate for the above acute complaints and reviewed radiology reports and findings.  These findings  were personally discussed with the patient.  Please see medical record for radiology report.  ____________________________________________   PROCEDURES  Procedure(s) performed:  Procedures    Critical Care performed: yes CRITICAL CARE Performed by: Merlyn Lot   Total critical care time: 45 minutes  Critical care time was exclusive of separately billable procedures and treating other patients.  Critical care was necessary to treat or prevent imminent or life-threatening deterioration.  Critical care was time spent personally by me on the following activities: development of treatment plan with patient and/or surrogate as well as nursing, discussions with consultants, evaluation of patient's response to treatment, examination of patient, obtaining history from patient or surrogate, ordering and performing treatments and interventions, ordering and review of laboratory studies, ordering and review of radiographic studies, pulse oximetry and re-evaluation of patient's condition.  ____________________________________________   INITIAL IMPRESSION / ASSESSMENT AND PLAN / ED COURSE  Pertinent labs & imaging results that were available during my care of the patient were reviewed by me and considered in my medical decision making (see chart for details).  DDX: sepsis, flu, pna, chf, asthma, dysrhythmia  Laura Gonzalez is a 76 y.o. who presents to the ED with acute shortness of breath and fever with marked tachycardia. Patient is critically ill-appearing but currently protecting her airway. Patient tachycardic and in A. fib with RVR likely secondary to underlying sepsis but also likely exacerbated by frequent nebulizer treatments via EMS. Breath sounds are concerning  for pneumonia and in the setting of her asthma will start on steroids as well as give dose of magnesium and additional nebulizer treatments.  The patient will be placed on continuous pulse oximetry and telemetry for  monitoring.  Laboratory evaluation will be sent to evaluate for the above complaints.     Clinical Course as of Sep 24 1727  Wed Sep 23, 2016  1628 Patient received IV fluids with minimal improvement in her heart rate. As her blood pressure remains stable and will start to try to achieve a general rate control by restarting her Cardizem. Patient will be started on antibiotic coverage for community-acquired pneumonia given her fever, respiratory symptoms as well as a chest x-ray showing a bronchitic changes.  [PR]  1700 Patient rechecked. Placed on supplemental oxygen as her O2 sats were dipping into the 90s. Heart rate has improved after Cardizem. Mild lactic acidosis. Fluids negative. Patient will require admission for further evaluation and management.  Have discussed with the patient and available family all diagnostics and treatments performed thus far and all questions were answered to the best of my ability. The patient demonstrates understanding and agreement with plan.   [PR]    Clinical Course User Index [PR] Merlyn Lot, MD     ____________________________________________   FINAL CLINICAL IMPRESSION(S) / ED DIAGNOSES  Final diagnoses:  Severe sepsis ([place])  Community acquired pneumonia, unspecified laterality  Acute bronchitis, unspecified organism  Atrial fibrillation with rapid ventricular response ([place])      NEW MEDICATIONS STARTED DURING THIS VISIT:  New Prescriptions   No medications on file     Note:  This document was prepared using Dragon voice recognition software and may include unintentional dictation errors.    Merlyn Lot, MD 09/23/16 [phone]

## 2016-09-23 NOTE — ED Notes (Signed)
Patient transported to X-ray 

## 2016-09-23 NOTE — Progress Notes (Signed)
Pharmacy Antibiotic Note  Laura Gonzalez is a 76 y.o. female admitted on 09/23/2016 with CAP.  Pharmacy has been consulted for ceftriaxone dosing.  Plan: Ceftriaxone 2 gm IV Q24H  Height: 5' 5.5" (166.4 cm) Weight: 269 lb 1.6 oz (122.1 kg) IBW/kg (Calculated) : 58.15  Temp (24hrs), Avg:99.4 F (37.4 C), Min:98.5 F (36.9 C), Max:100.3 F (37.9 C)   Recent Labs Lab 09/23/16 1542  WBC 13.0*  CREATININE 1.04*  LATICACIDVEN 2.3*    Estimated Creatinine Clearance: 62.8 mL/min (A) (by C-G formula based on SCr of 1.04 mg/dL (H)).    Allergies  Allergen Reactions  . Amoxicillin Other (See Comments)    Lips swelling, tingling  . Benzocaine-Menthol     Throat swelling  . Biafine [Wound Dressings] Swelling  . Chloraseptic Sore Throat [Acetaminophen] Other (See Comments)    throat swelling  . Fosamax [Alendronate Sodium]   . Neosporin [Neomycin-Bacitracin Zn-Polymyx] Other (See Comments)    Skin redness, puffy  . Other     "chlorotrimitron"-- rxn: throat swelling  . Oxycodone Other (See Comments)    Dizziness, lips tingling  . Statins Other (See Comments)    Muscle weakness, weak  . Tape Other (See Comments)    Skin redness     Thank you for allowing pharmacy to be a part of this patient's care.  Laural Benes, Pharm.D., BCPS Clinical Pharmacist 09/23/2016 7:31 PM

## 2016-09-23 NOTE — ED Triage Notes (Signed)
Pt arrived to ED via EMS from home reporti\ng a cough since Monday with fever of 100.7 yesterday. Pt reports fever subsided on its own without OTC medications. Productive cough worsened today with SOB upon exertion. EMS administered 1 duoneb and 1 albuterol treatment and placed pt on 2L oxygen due to oxygen saturation of 90% in route to ED. Pt able to speak in full sentences upon arrival and able to stand and pivot to treatment bed.   Hx of Afib, Pt is tachycardic but denies CP at this time.

## 2016-09-23 NOTE — ED Notes (Signed)
MD at bedside. 

## 2016-09-23 NOTE — H&P (Signed)
[place] [facility] [place] NAME: Laura Gonzalez    MR#:  [id number]  DATE OF BIRTH:  1940-09-22  DATE OF ADMISSION:  09/23/2016  PRIMARY CARE PHYSICIAN: [name], MD   REQUESTING/REFERRING PHYSICIAN: Dr. Quentin Cornwall  CHIEF COMPLAINT:   Cough  HISTORY OF PRESENT ILLNESS:  Laura Gonzalez  is a 76 y.o. female with a known history of Asthma and Atrial Fibrillation who presents with shortness of breath and cough for the past 3 days. Patient had a temperature on Monday of over 100. For the past few days she has had increase in sense of breath, wheezing and cough. Today she noticed that her heart rate were above 160 so she came to the ER for further evaluation. In the emergency room chest x-ray shows pneumonia. She is started on Rocephin and azithromycin. She is also on oxygen which she does not use at home  PAST MEDICAL HISTORY:   Past Medical History:  Diagnosis Date  . Arthritis    wrist and knees  . Asthma   . Cancer ([place])    skin cancer  . Carpal tunnel syndrome   . Chicken pox   . Degenerative arthritis of knee, bilateral   . Diverticulitis   . Diverticulosis   . Dysrhythmia    afib  . GERD (gastroesophageal reflux disease)   . Hernia, umbilical   . Hyperlipidemia   . Hypertension   . Numbness and tingling    arm to leg  . Osteoporosis   . Pneumonia   . Shortness of breath    only with astma attacks  . Sleep apnea     PAST SURGICAL HISTORY:   Past Surgical History:  Procedure Laterality Date  . ANTERIOR CERVICAL DECOMP/DISCECTOMY FUSION N/A 08/09/2012   Procedure: ANTERIOR CERVICAL DECOMPRESSION/DISCECTOMY FUSION 2 LEVELS;  Surgeon: [name], MD;  Location: [facility]URO ORS;  Service: Neurosurgery;  Laterality: N/A;  C3-4 C4-5 Anterior cervical decompression/diskectomy/fusion/[employer] Bone/Trestle plate  . BREAST BIOPSY Left 1984   EXCISIONAL - NEG  . BREAST BIOPSY Left 1987   EXCISIONAL - NEG  . BREAST SURGERY    .  COLONOSCOPY    . COLONOSCOPY WITH PROPOFOL N/A 07/29/2015   Procedure: COLONOSCOPY WITH PROPOFOL;  Surgeon: [name], MD;  Location: [facility] END[facility]COPY;  Service: Endoscopy;  Laterality: N/A;  . colonscopy  2000,2007,2012  . ESOPHAGOGASTRODUODENOSCOPY    . HERNIA REPAIR    . JOINT REPLACEMENT Right    Total Knee Replacement  . LIPOMA EXCISION Right 2010   back  . MASTECTOMY PARTIAL / LUMPECTOMY Left 1980s  . ROTATOR CUFF REPAIR Bilateral right- 2009, left 2011  . SKIN CANCER EXCISION  2009   back of neck and right cheek  . TONSILLECTOMY  1960  . TRACHEOSTOMY    . UMBILICAL HERNIA REPAIR  [id number]  . VARICOSE VEIN SURGERY Left 04/2009 rt 2011  . [place]  . WRIST SURGERY Right 1998   external fixator    SOCIAL HISTORY:   Social History  Substance Use Topics  . Smoking status: Never Smoker  . Smokeless tobacco: Never Used  . Alcohol use No    FAMILY HISTORY:   Family History  Problem Relation Age of Onset  . Pulmonary embolism Mother   . Arthritis Mother   . Hypertension Mother   . Heart attack Mother   . Breast cancer Mother 29  . Stomach cancer Maternal Aunt   . Throat cancer  Maternal Uncle   . Stomach cancer Maternal Grandfather     DRUG ALLERGIES:   Allergies  Allergen Reactions  . Amoxicillin Other (See Comments)    Lips swelling, tingling  . Benzocaine-Menthol     Throat swelling  . Biafine [Wound Dressings] Swelling  . Chloraseptic Sore Throat [Acetaminophen] Other (See Comments)    throat swelling  . Fosamax [Alendronate Sodium]   . Neosporin [Neomycin-Bacitracin Zn-Polymyx] Other (See Comments)    Skin redness, puffy  . Other     "chlorotrimitron"-- rxn: throat swelling  . Oxycodone Other (See Comments)    Dizziness, lips tingling  . Statins Other (See Comments)    Muscle weakness, weak  . Tape Other (See Comments)    Skin redness    REVIEW OF SYSTEMS:   Review of Systems  Constitutional: Negative for chills,  fever and malaise/fatigue.  HENT: Negative.  Negative for ear discharge, ear pain, hearing loss, nosebleeds and sore throat.   Eyes: Negative.  Negative for blurred vision and pain.  Respiratory: Positive for cough, sputum production, shortness of breath and wheezing. Negative for hemoptysis.   Cardiovascular: Negative.  Negative for chest pain, palpitations and leg swelling.  Gastrointestinal: Negative.  Negative for abdominal pain, blood in stool, diarrhea, nausea and vomiting.  Genitourinary: Negative.  Negative for dysuria.  Musculoskeletal: Negative.  Negative for back pain.  Skin: Negative.   Neurological: Positive for weakness. Negative for dizziness, tremors, speech change, focal weakness, seizures and headaches.  Endo/Heme/Allergies: Negative.  Does not bruise/bleed easily.  Psychiatric/Behavioral: Negative.  Negative for depression, hallucinations and suicidal ideas.    MEDICATIONS AT HOME:   Prior to Admission medications   Medication Sig Start Date End Date Taking? Authorizing Provider  acetaminophen (TYLENOL) 325 MG tablet Take 650 mg by mouth every 6 (six) hours as needed for mild pain, moderate pain or headache.   Yes Historical Provider, MD  acidophilus (RISAQUAD) CAPS capsule Take 1 capsule by mouth daily.   Yes Historical Provider, MD  albuterol (PROVENTIL HFA;VENTOLIN HFA) 108 (90 BASE) MCG/ACT inhaler Inhale 2 puffs into the lungs every 6 (six) hours as needed. For shortness of breath   Yes Historical Provider, MD  albuterol (PROVENTIL) (2.5 MG/3ML) 0.083% nebulizer solution Take 2.5 mg by nebulization every 6 (six) hours as needed for wheezing or shortness of breath.   Yes Historical Provider, MD  Aloe-Sodium Chloride (AYR SALINE NASAL GEL NA) Place 1 application into the nose as needed.   Yes Historical Provider, MD  Alum Hydroxide-Mag Carbonate ([employer] HEARTBURN RELIEF PO) Take 1 tablet by mouth as needed.   Yes Historical Provider, MD  aspirin EC 81 MG tablet Take 81 mg  by mouth daily.   Yes Historical Provider, MD  Calcium Citrate (CITRACAL PO) Take 2 tablets by mouth daily.    Yes Historical Provider, MD  Cholecalciferol (VITAMIN D3) 5000 UNITS CAPS Take 1 capsule by mouth daily.   Yes Historical Provider, MD  diltiazem (CARDIZEM CD) 300 MG 24 hr capsule Take 300 mg by mouth daily.   Yes Historical Provider, MD  diltiazem (CARDIZEM) 60 MG tablet Take 60 mg by mouth every 6 (six) hours as needed. Takes if has palpitation after taking diltiazem 300mg  dose    Yes Historical Provider, MD  diphenhydrAMINE (BENADRYL) 25 MG tablet Take 25 mg by mouth every 6 (six) hours as needed.   Yes Historical Provider, MD  ezetimibe (ZETIA) 10 MG tablet Take 10 mg by mouth daily.    Yes Historical Provider, MD  fluticasone (FLONASE) 50 MCG/ACT nasal spray Place 1 spray into both nostrils daily.    Yes Historical Provider, MD  hydrochlorothiazide (HYDRODIURIL) 12.5 MG tablet Take 1 tablet (12.5 mg total) by mouth daily. 04/01/16  Yes Gladstone Lighter, MD  loratadine (CLARITIN) 10 MG tablet Take 10 mg by mouth daily.   Yes Historical Provider, MD  magnesium oxide (MAG-OX) 400 MG tablet Take 400 mg by mouth daily.   Yes Historical Provider, MD  MEGARED OMEGA-3 KRILL OIL 500 MG CAPS Take 1 capsule by mouth daily.   Yes Historical Provider, MD  metoprolol tartrate (LOPRESSOR) 25 MG tablet Take 12.5 mg by mouth 2 (two) times daily.    Yes Historical Provider, MD  montelukast (SINGULAIR) 10 MG tablet Take 10 mg by mouth at bedtime.   Yes Historical Provider, MD  Multiple Vitamin (MULTIVITAMIN WITH MINERALS) TABS Take 2 tablets by mouth daily.    Yes Historical Provider, MD  Multiple Vitamins-Minerals (PRESERVISION AREDS PO) Take 1 tablet by mouth daily.   Yes Historical Provider, MD  potassium chloride SA (K-DUR,KLOR-CON) 20 MEQ tablet Take 1 tablet (20 mEq total) by mouth 2 (two) times daily. Patient taking differently: Take 20 mEq by mouth 3 (three) times daily.  04/01/16  Yes Gladstone Lighter, MD  trolamine salicylate (ASPERCREME) 10 % cream Apply 1 application topically as needed for muscle pain.   Yes Historical Provider, MD  vitamin B-12 (CYANOCOBALAMIN) 1000 MCG tablet Take 3,000 mcg by mouth daily.   Yes Historical Provider, MD  warfarin (COUMADIN) 2 MG tablet Take 1 tablet (2 mg total) by mouth daily. Patient taking differently: Take 4 mg by mouth daily.  12/20/14  Yes Fritzi Mandes, MD  bisacodyl (BISACODYL) 5 MG EC tablet Take 5 mg by mouth daily as needed for moderate constipation.    Historical Provider, MD  mometasone-formoterol (DULERA) 200-5 MCG/ACT AERO Inhale 2 puffs into the lungs 2 (two) times daily. Patient not taking: Reported on 09/23/2016 04/01/16   Gladstone Lighter, MD      VITAL SIGNS:  Blood pressure 134/86, pulse (!) 149, temperature 100.3 F (37.9 C), temperature source Oral, height 5' 5.5" (1.664 m), weight 123.4 kg (272 lb), SpO2 92 %.  PHYSICAL EXAMINATION:   Physical Exam  Constitutional: She is oriented to person, place, and time and well-developed, well-nourished, and in no distress. No distress.  Obese  HENT:  Head: Normocephalic.  Eyes: No scleral icterus.  Neck: Normal range of motion. Neck supple. No JVD present. No tracheal deviation present.  Cardiovascular: Normal rate, regular rhythm and normal heart sounds.  Exam reveals no gallop and no friction rub.   No murmur heard. Pulmonary/Chest: Effort normal. No respiratory distress. She has wheezes. She has no rales. She exhibits no tenderness.  Abdominal: Soft. Bowel sounds are normal. She exhibits no distension and no mass. There is no tenderness. There is no rebound and no guarding.  Musculoskeletal: Normal range of motion. She exhibits no edema.  Neurological: She is alert and oriented to person, place, and time.  Skin: Skin is warm. No rash noted. No erythema.  Psychiatric: Affect and judgment normal.      LABORATORY PANEL:   CBC  Recent Labs Lab 09/23/16 1542  WBC  13.0*  HGB 16.2*  HCT 48.2*  PLT 206   ------------------------------------------------------------------------------------------------------------------  Chemistries   Recent Labs Lab 09/23/16 1542  NA 139  K 4.1  CL 102  CO2 28  GLUCOSE 137*  BUN 18  CREATININE 1.04*  CALCIUM 9.5  MG 2.0  AST 41  ALT 32  ALKPHOS 69  BILITOT 0.8   ------------------------------------------------------------------------------------------------------------------  Cardiac Enzymes  Recent Labs Lab 09/23/16 1542  TROPONINI <0.03   - ----------------------------------------------------------------------------------------------------------------  RADIOLOGY:  Dg Chest 2 View  Result Date: 09/23/2016 CLINICAL DATA:  Cough, shortness breath, and fever. EXAM: CHEST  2 VIEW COMPARISON:  03/29/2016 FINDINGS: Heart size and pulmonary vascularity are normal. Peribronchial thickening. Slight atelectasis at the left lung base laterally. No infiltrates or effusions. No acute bone abnormality. IMPRESSION: Bronchitic changes with minimal atelectasis at the left base laterally. Electronically Signed   By: Lorriane Shire M.D.   On: 09/23/2016 15:47    EKG:   Atrial fibrillation with RVR heart rate 157  IMPRESSION AND PLAN:   76 year old female with atrial fibrillation and asthma who presents with disturbance of breath and found to have asthma exacerbation on top of atrial fibrillation with RVR.  1.chronic atrial fibrillation with RVR: Patient given diltiazem 60 mg In the emergency room and started on diltiazem gtt which I will continue. Continue Metoprolol and Coumadin Pharmacy consult  She is followed by Dr Ubaldo Glassing if consult needed.   2. Acute hypoxic respiratory failure- Mild intermittant Asthma with acute  exacerbation: She has diffuse wheezing on examination. I wills tart IV steroids. Continue inhalers and NEB  3.Sepsis due to CAP:She presents with tachycardia, fever and elevated  WBC. Continue Rocephin and Azithromycin. Follow up on cultures and lactic acid levels  4. HTN: Continue Meoprolol and HCYZ 5. Morbid obesity due to excess calories: Weight loss as tolerated 6. OSA: CPAP at night  All the records are reviewed and case discussed with ED provider. Management plans discussed with the patient and she is in agreement  CODE STATUS: limited  TOTAL TIME TAKING CARE OF THIS PATIENT: 45 minutes.    Seraphim Trow M.D on 09/23/2016 at 5:39 PM  Between 7am to 6pm - Pager - [phone]  After 6pm go to [email or web address] - password [email or web address] [facility]  Office  [phone]  CC: Primary care physician; [name], MD

## 2016-09-23 NOTE — ED Notes (Signed)
RN called Pharmacy to ask for Cardizem Drip.

## 2016-09-23 NOTE — Progress Notes (Signed)
Anticoagulation monitoring(Lovenox):  76 yo female ordered Lovenox 40 mg Q24h  Filed Weights   09/23/16 1527 09/23/16 1855  Weight: 272 lb (123.4 kg) 269 lb 1.6 oz (122.1 kg)   BMI 44.2   Lab Results  Component Value Date   CREATININE 1.04 (H) 09/23/2016   CREATININE 0.71 03/31/2016   CREATININE 0.71 03/29/2016   Estimated Creatinine Clearance: 62.8 mL/min (A) (by C-G formula based on SCr of 1.04 mg/dL (H)). Hemoglobin & Hematocrit     Component Value Date/Time   HGB 16.2 (H) 09/23/2016 1542   HGB 14.4 10/10/2014 0715   HCT 48.2 (H) 09/23/2016 1542   HCT 43.9 10/10/2014 0715     Per Protocol for Patient with estCrcl > 30 ml/min and BMI > 40, will transition to Lovenox 40 mg Q12h.

## 2016-09-24 LAB — PROTIME-INR
INR: 1.62
Prothrombin Time: 19.4 seconds — ABNORMAL HIGH (ref 11.4–15.2)

## 2016-09-24 LAB — BLOOD CULTURE ID PANEL (REFLEXED)
Acinetobacter baumannii: NOT DETECTED
CANDIDA ALBICANS: NOT DETECTED
CANDIDA GLABRATA: NOT DETECTED
Candida krusei: NOT DETECTED
Candida parapsilosis: NOT DETECTED
Candida tropicalis: NOT DETECTED
ENTEROBACTER CLOACAE COMPLEX: NOT DETECTED
ENTEROBACTERIACEAE SPECIES: NOT DETECTED
Enterococcus species: NOT DETECTED
Escherichia coli: NOT DETECTED
Haemophilus influenzae: NOT DETECTED
KLEBSIELLA OXYTOCA: NOT DETECTED
KLEBSIELLA PNEUMONIAE: NOT DETECTED
Listeria monocytogenes: NOT DETECTED
Methicillin resistance: DETECTED — AB
NEISSERIA MENINGITIDIS: NOT DETECTED
PROTEUS SPECIES: NOT DETECTED
Pseudomonas aeruginosa: NOT DETECTED
STREPTOCOCCUS AGALACTIAE: NOT DETECTED
STREPTOCOCCUS PYOGENES: NOT DETECTED
STREPTOCOCCUS SPECIES: NOT DETECTED
Serratia marcescens: NOT DETECTED
Staphylococcus aureus (BCID): NOT DETECTED
Staphylococcus species: DETECTED — AB
Streptococcus pneumoniae: NOT DETECTED

## 2016-09-24 LAB — CBC
HEMATOCRIT: 43 % (ref 35.0–47.0)
HEMOGLOBIN: 14.2 g/dL (ref 12.0–16.0)
MCH: 30.8 pg (ref 26.0–34.0)
MCHC: 33 g/dL (ref 32.0–36.0)
MCV: 93.5 fL (ref 80.0–100.0)
Platelets: 173 10*3/uL (ref 150–440)
RBC: 4.6 MIL/uL (ref 3.80–5.20)
RDW: 14.2 % (ref 11.5–14.5)
WBC: 7.5 10*3/uL (ref 3.6–11.0)

## 2016-09-24 LAB — BASIC METABOLIC PANEL
ANION GAP: 5 (ref 5–15)
BUN: 18 mg/dL (ref 6–20)
CALCIUM: 8.6 mg/dL — AB (ref 8.9–10.3)
CO2: 25 mmol/L (ref 22–32)
Chloride: 108 mmol/L (ref 101–111)
Creatinine, Ser: 0.73 mg/dL (ref 0.44–1.00)
GFR calc Af Amer: >60 mL/min (ref >60)
GFR calc non Af Amer: >60 mL/min (ref >60)
GLUCOSE: 176 mg/dL — AB (ref 65–99)
POTASSIUM: 3.7 mmol/L (ref 3.5–5.1)
Sodium: 138 mmol/L (ref 135–145)

## 2016-09-24 MED ORDER — ALUM & MAG HYDROXIDE-SIMETH 200-200-20 MG/5ML PO SUSP
30.0000 mL | Freq: Four times a day (QID) | ORAL | Status: DC | PRN
Start: 2016-09-24 — End: 2016-09-26
  Administered 2016-09-24: 30 mL via ORAL
  Filled 2016-09-24: qty 30

## 2016-09-24 MED ORDER — WARFARIN - PHARMACIST DOSING INPATIENT: Freq: Every day | Status: DC

## 2016-09-24 NOTE — Progress Notes (Signed)
Cardizem drip continues at 7.5 ml/hr

## 2016-09-24 NOTE — Progress Notes (Signed)
[place] [facility] [place] NAME: Jamee Keach    MR#:  [id number]  DATE OF BIRTH:  [birth year]  SUBJECTIVE:  CHIEF COMPLAINT:   Chief Complaint  Patient presents with  . Cough  . Fever   Better cough and shortness of breath. Oxygen [place] 2L. on Cardizem drip for A. fib with RVR REVIEW OF SYSTEMS:  Review of Systems  Constitutional: Positive for malaise/fatigue. Negative for chills and fever.  HENT: Negative for congestion.   Eyes: Negative for blurred vision and double vision.  Respiratory: Positive for cough, sputum production and shortness of breath. Negative for hemoptysis, wheezing and stridor.   Cardiovascular: Negative for chest pain and leg swelling.  Gastrointestinal: Negative for abdominal pain, blood in stool, constipation, diarrhea, melena, nausea and vomiting.  Genitourinary: Negative for dysuria and hematuria.  Musculoskeletal: Negative for back pain.  Skin: Negative for itching and rash.  Neurological: Positive for weakness. Negative for dizziness, focal weakness and loss of consciousness.  Psychiatric/Behavioral: Negative for depression. The patient does not have insomnia.     DRUG ALLERGIES:   Allergies  Allergen Reactions  . Amoxicillin Other (See Comments)    Lips swelling, tingling  . Benzocaine-Menthol     Throat swelling  . Biafine [Wound Dressings] Swelling  . Chloraseptic Sore Throat [Acetaminophen] Other (See Comments)    throat swelling  . Fosamax [Alendronate Sodium]   . Neosporin [Neomycin-Bacitracin Zn-Polymyx] Other (See Comments)    Skin redness, puffy  . Other     "chlorotrimitron"-- rxn: throat swelling  . Oxycodone Other (See Comments)    Dizziness, lips tingling  . Statins Other (See Comments)    Muscle weakness, weak  . Tape Other (See Comments)    Skin redness   VITALS:  Blood pressure (!) 100/44, pulse 99, temperature 98.6 F (37 C), temperature source Oral, resp. rate 18, height 5' 5.5"  (1.664 m), weight 286 lb 1.6 oz (129.8 kg), SpO2 93 %. PHYSICAL EXAMINATION:  Physical Exam  Constitutional: She is oriented to person, place, and time and well-developed, well-nourished, and in no distress.  HENT:  Head: Normocephalic.  Mouth/Throat: Oropharynx is clear and moist.  Eyes: Conjunctivae and EOM are normal.  Neck: Normal range of motion. Neck supple. No JVD present. No tracheal deviation present.  Cardiovascular: Normal heart sounds.  Exam reveals no gallop.   No murmur heard. Irregular rate and rhythm.  Pulmonary/Chest: Effort normal. No respiratory distress. She has wheezes. She has no rales.  Abdominal: Soft. Bowel sounds are normal. She exhibits no distension. There is no tenderness.  Musculoskeletal: She exhibits no edema.  Neurological: She is alert and oriented to person, place, and time. No cranial nerve deficit.  Skin: No rash noted. No erythema.  Psychiatric: Affect normal.   LABORATORY PANEL:  Female CBC  Recent Labs Lab 09/24/16 0532  WBC 7.5  HGB 14.2  HCT 43.0  PLT 173   ------------------------------------------------------------------------------------------------------------------ Chemistries   Recent Labs Lab 09/23/16 1542 09/24/16 0532  NA 139 138  K 4.1 3.7  CL 102 108  CO2 28 25  GLUCOSE 137* 176*  BUN 18 18  CREATININE 1.04* 0.73  CALCIUM 9.5 8.6*  MG 2.0  --   AST 41  --   ALT 32  --   ALKPHOS 69  --   BILITOT 0.8  --    RADIOLOGY:  No results found. ASSESSMENT AND PLAN:   76 year old female with atrial fibrillation and asthma who presents with disturbance  of breath and found to have asthma exacerbation on top of atrial fibrillation with RVR.  1.chronic atrial fibrillation with RVR: Patient given diltiazem 60 mg In the emergency room and started on diltiazem gtt. Start cardizem po, discontinue gtt, Continue Metoprolol and Coumadin Cardiology consult from Dr Ubaldo Glassing.   2. Acute hypoxic respiratory failure- Mild  intermittant Asthma with acute  exacerbation:  disontinued IV steroids. Continue inhalers and NEB  3.Sepsis due to CAP:She presents with tachycardia, fever and elevated WBC. Continue Rocephin and Azithromycin. Follow up on cultures and lactic acid levels is normal.  4. HTN: Continue Meoprolol and HCTZ 5. Morbid obesity due to excess calories: Weight loss as tolerated 6. OSA: CPAP at night  All the records are reviewed and case discussed with Care Management/Social Worker. Management plans discussed with the patient, family and they are in agreement.  CODE STATUS: Partial Code  TOTAL TIME TAKING CARE OF THIS PATIENT: 38 minutes.   More than 50% of the time was spent in counseling/coordination of care: YES  POSSIBLE D/C IN 2 DAYS, DEPENDING ON CLINICAL CONDITION.   Demetrios Loll M.D on 09/24/2016 at 3:46 PM  Between 7am to 6pm - Pager - [phone]  After 6pm go to [email or web address] - Proofreader  [facility]  Office  [phone]  CC: Primary care physician; [name], MD  Note: This dictation was prepared with Dragon dictation along with smaller phrase technology. Any transcriptional errors that result from this process are unintentional.

## 2016-09-24 NOTE — Progress Notes (Signed)
PHARMACY - PHYSICIAN COMMUNICATION CRITICAL VALUE ALERT - BLOOD CULTURE IDENTIFICATION (BCID)  Results for orders placed or performed during the hospital encounter of 09/23/16  Blood Culture ID Panel (Reflexed) (Collected: 09/23/2016  3:42 PM)  Result Value Ref Range   Enterococcus species NOT DETECTED NOT DETECTED   Listeria monocytogenes NOT DETECTED NOT DETECTED   Staphylococcus species DETECTED (A) NOT DETECTED   Staphylococcus aureus NOT DETECTED NOT DETECTED   Methicillin resistance DETECTED (A) NOT DETECTED   Streptococcus species NOT DETECTED NOT DETECTED   Streptococcus agalactiae NOT DETECTED NOT DETECTED   Streptococcus pneumoniae NOT DETECTED NOT DETECTED   Streptococcus pyogenes NOT DETECTED NOT DETECTED   Acinetobacter baumannii NOT DETECTED NOT DETECTED   Enterobacteriaceae species NOT DETECTED NOT DETECTED   Enterobacter cloacae complex NOT DETECTED NOT DETECTED   Escherichia coli NOT DETECTED NOT DETECTED   Klebsiella oxytoca NOT DETECTED NOT DETECTED   Klebsiella pneumoniae NOT DETECTED NOT DETECTED   Proteus species NOT DETECTED NOT DETECTED   Serratia marcescens NOT DETECTED NOT DETECTED   Haemophilus influenzae NOT DETECTED NOT DETECTED   Neisseria meningitidis NOT DETECTED NOT DETECTED   Pseudomonas aeruginosa NOT DETECTED NOT DETECTED   Candida albicans NOT DETECTED NOT DETECTED   Candida glabrata NOT DETECTED NOT DETECTED   Candida krusei NOT DETECTED NOT DETECTED   Candida parapsilosis NOT DETECTED NOT DETECTED   Candida tropicalis NOT DETECTED NOT DETECTED    Name of physician (or Provider) Contacted: Chen  Changes to prescribed antibiotics required: no change in therapy  Paulina Fusi, PharmD, BCPS 09/24/2016 11:16 AM

## 2016-09-24 NOTE — Care Management (Signed)
Patient anticipates moving into an apartment at [place]. She has had home health in the past with [facility].  PT consult is pending. 02 requirement is acute

## 2016-09-24 NOTE — Progress Notes (Signed)
Cardizem drip discontinued.

## 2016-09-24 NOTE — Progress Notes (Signed)
ANTICOAGULATION CONSULT NOTE - Initial Consult  Pharmacy Consult for Warfarin Indication: atrial fibrillation  Allergies  Allergen Reactions  . Amoxicillin Other (See Comments)    Lips swelling, tingling  . Benzocaine-Menthol     Throat swelling  . Biafine [Wound Dressings] Swelling  . Chloraseptic Sore Throat [Acetaminophen] Other (See Comments)    throat swelling  . Fosamax [Alendronate Sodium]   . Neosporin [Neomycin-Bacitracin Zn-Polymyx] Other (See Comments)    Skin redness, puffy  . Other     "chlorotrimitron"-- rxn: throat swelling  . Oxycodone Other (See Comments)    Dizziness, lips tingling  . Statins Other (See Comments)    Muscle weakness, weak  . Tape Other (See Comments)    Skin redness    Patient Measurements: Height: 5' 5.5" (166.4 cm) Weight: 286 lb 1.6 oz (129.8 kg) IBW/kg (Calculated) : 58.15 Heparin Dosing Weight:    Vital Signs: Temp: 98.6 F (37 C) (04/05 0808) Temp Source: Oral (04/05 0808) BP: 100/44 (04/05 0808) Pulse Rate: 99 (04/05 0808)  Labs:  Recent Labs  09/23/16 1542 09/24/16 0532  HGB 16.2* 14.2  HCT 48.2* 43.0  PLT 206 173  LABPROT 19.0* 19.4*  INR 1.58 1.62  CREATININE 1.04* 0.73  TROPONINI <0.03  --     Estimated Creatinine Clearance: 84.5 mL/min (by C-G formula based on SCr of 0.73 mg/dL).   Medical History: Past Medical History:  Diagnosis Date  . Arthritis    wrist and knees  . Asthma   . Cancer ([place])    skin cancer  . Carpal tunnel syndrome   . Chicken pox   . Degenerative arthritis of knee, bilateral   . Diverticulitis   . Diverticulosis   . Dysrhythmia    afib  . GERD (gastroesophageal reflux disease)   . Hernia, umbilical   . Hyperlipidemia   . Hypertension   . Numbness and tingling    arm to leg  . Osteoporosis   . Pneumonia   . Shortness of breath    only with astma attacks  . Sleep apnea     Medications:  Taking Warfarin 4mg  daily prior to admission  Assessment: Patient is a 76yo  female with history of afib to be continued on Warfarin, pharmacy consulted for management.  4/4 INR 1.58  Warfarin 4mg   4/5 INR 1.62  Goal of Therapy:  INR 2-3   Plan:  Patient is currently receiving antibiotics which may increase INR. Will continue with home dose of Warfarin 4mg . Daily INR checks.  Laura Gonzalez, PharmD, BCPS 09/24/2016 4:03 PM

## 2016-09-24 NOTE — Consult Note (Signed)
[facility] Consultation Note  Patient ID: Laura Gonzalez, MRN: [id number], DOB/AGE: Feb 03, 1941 76 y.o. Admit date: 09/23/2016   Date of Consult: 09/24/2016 Primary Physician: [name], MD Primary Cardiologist: Fath  Chief Complaint:  Chief Complaint  Patient presents with  . Cough  . Fever   Reason for Consult: atrial fibrillation  HPI: 76 y.o. female with the known chronic nonvalvular atrial fibrillation essential hypertension mixed hyperlipidemia sleep apnea for which showed the patient has come in with significant cough congestion and wheezing and hypoxia with a pneumonia. This pneumonia has caused her atrial fibrillation to have a rapid heart rate. With additional medication management as well as oxygenation and treatment of her hypoxia the patient has had some improvements of her symptoms and no significant improvements and heart rate control. The patient now has better heart rate control on current medical regimen including diltiazem. The patient's hypoxia has definitely improved and her sleep apnea is being treated at night with CPAP machine. The patient has had anticoagulation with warfarin but currently has had a low INR due to likely antibiotic medication management. The patient will need to change in dosage of her warfarin for INR between 2 and 3. Currently there is no evidence of congestive heart failure or true angina  Past Medical History:  Diagnosis Date  . Arthritis    wrist and knees  . Asthma   . Cancer ([place])    skin cancer  . Carpal tunnel syndrome   . Chicken pox   . Degenerative arthritis of knee, bilateral   . Diverticulitis   . Diverticulosis   . Dysrhythmia    afib  . GERD (gastroesophageal reflux disease)   . Hernia, umbilical   . Hyperlipidemia   . Hypertension   . Numbness and tingling    arm to leg  . Osteoporosis   . Pneumonia   . Shortness of breath    only with astma attacks  . Sleep apnea       Surgical History:  Past  Surgical History:  Procedure Laterality Date  . ANTERIOR CERVICAL DECOMP/DISCECTOMY FUSION N/A 08/09/2012   Procedure: ANTERIOR CERVICAL DECOMPRESSION/DISCECTOMY FUSION 2 LEVELS;  Surgeon: [name], MD;  Location: [facility];  Service: Neurosurgery;  Laterality: N/A;  C3-4 C4-5 Anterior cervical decompression/diskectomy/fusion/[employer] Bone/Trestle plate  . BREAST BIOPSY Left 1984   EXCISIONAL - NEG  . BREAST BIOPSY Left 1987   EXCISIONAL - NEG  . BREAST SURGERY    . COLONOSCOPY    . COLONOSCOPY WITH PROPOFOL N/A 07/29/2015   Procedure: COLONOSCOPY WITH PROPOFOL;  Surgeon: [name], MD;  Location: [facility] ENDOSCOPY;  Service: Endoscopy;  Laterality: N/A;  . colonscopy  2000,2007,2012  . ESOPHAGOGASTRODUODENOSCOPY    . HERNIA REPAIR    . JOINT REPLACEMENT Right    Total Knee Replacement  . LIPOMA EXCISION Right 2010   back  . MASTECTOMY PARTIAL / LUMPECTOMY Left 1980s  . ROTATOR CUFF REPAIR Bilateral right- 2009, left 2011  . SKIN CANCER EXCISION  2009   back of neck and right cheek  . TONSILLECTOMY  1960  . TRACHEOSTOMY    . UMBILICAL HERNIA REPAIR  [id number]  . VARICOSE VEIN SURGERY Left 04/2009 rt 2011  . [place]  . WRIST SURGERY Right 1998   external fixator     Home Meds: Prior to Admission medications   Medication Sig Start Date End Date Taking? Authorizing Provider  acetaminophen (TYLENOL) 325 MG tablet Take  650 mg by mouth every 6 (six) hours as needed for mild pain, moderate pain or headache.   Yes Historical Provider, MD  acidophilus (RISAQUAD) CAPS capsule Take 1 capsule by mouth daily.   Yes Historical Provider, MD  albuterol (PROVENTIL HFA;VENTOLIN HFA) 108 (90 BASE) MCG/ACT inhaler Inhale 2 puffs into the lungs every 6 (six) hours as needed. For shortness of breath   Yes Historical Provider, MD  albuterol (PROVENTIL) (2.5 MG/3ML) 0.083% nebulizer solution Take 2.5 mg by nebulization every 6 (six) hours as needed for wheezing or  shortness of breath.   Yes Historical Provider, MD  Aloe-Sodium Chloride (AYR SALINE NASAL GEL NA) Place 1 application into the nose as needed.   Yes Historical Provider, MD  Alum Hydroxide-Mag Carbonate ([employer] HEARTBURN RELIEF PO) Take 1 tablet by mouth as needed.   Yes Historical Provider, MD  aspirin EC 81 MG tablet Take 81 mg by mouth daily.   Yes Historical Provider, MD  Calcium Citrate (CITRACAL PO) Take 2 tablets by mouth daily.    Yes Historical Provider, MD  Cholecalciferol (VITAMIN D3) 5000 UNITS CAPS Take 1 capsule by mouth daily.   Yes Historical Provider, MD  diltiazem (CARDIZEM CD) 300 MG 24 hr capsule Take 300 mg by mouth daily.   Yes Historical Provider, MD  diltiazem (CARDIZEM) 60 MG tablet Take 60 mg by mouth every 6 (six) hours as needed. Takes if has palpitation after taking diltiazem 300mg  dose    Yes Historical Provider, MD  diphenhydrAMINE (BENADRYL) 25 MG tablet Take 25 mg by mouth every 6 (six) hours as needed.   Yes Historical Provider, MD  ezetimibe (ZETIA) 10 MG tablet Take 10 mg by mouth daily.    Yes Historical Provider, MD  fluticasone (FLONASE) 50 MCG/ACT nasal spray Place 1 spray into both nostrils daily.    Yes Historical Provider, MD  hydrochlorothiazide (HYDRODIURIL) 12.5 MG tablet Take 1 tablet (12.5 mg total) by mouth daily. 04/01/16  Yes Gladstone Lighter, MD  loratadine (CLARITIN) 10 MG tablet Take 10 mg by mouth daily.   Yes Historical Provider, MD  magnesium oxide (MAG-OX) 400 MG tablet Take 400 mg by mouth daily.   Yes Historical Provider, MD  MEGARED OMEGA-3 KRILL OIL 500 MG CAPS Take 1 capsule by mouth daily.   Yes Historical Provider, MD  metoprolol tartrate (LOPRESSOR) 25 MG tablet Take 12.5 mg by mouth 2 (two) times daily.    Yes Historical Provider, MD  montelukast (SINGULAIR) 10 MG tablet Take 10 mg by mouth at bedtime.   Yes Historical Provider, MD  Multiple Vitamin (MULTIVITAMIN WITH MINERALS) TABS Take 2 tablets by mouth daily.    Yes Historical  Provider, MD  Multiple Vitamins-Minerals (PRESERVISION AREDS PO) Take 1 tablet by mouth daily.   Yes Historical Provider, MD  potassium chloride SA (K-DUR,KLOR-CON) 20 MEQ tablet Take 1 tablet (20 mEq total) by mouth 2 (two) times daily. Patient taking differently: Take 20 mEq by mouth 3 (three) times daily.  04/01/16  Yes Gladstone Lighter, MD  trolamine salicylate (ASPERCREME) 10 % cream Apply 1 application topically as needed for muscle pain.   Yes Historical Provider, MD  vitamin B-12 (CYANOCOBALAMIN) 1000 MCG tablet Take 3,000 mcg by mouth daily.   Yes Historical Provider, MD  warfarin (COUMADIN) 2 MG tablet Take 1 tablet (2 mg total) by mouth daily. Patient taking differently: Take 4 mg by mouth daily.  12/20/14  Yes Fritzi Mandes, MD  bisacodyl (BISACODYL) 5 MG EC tablet Take 5 mg by  mouth daily as needed for moderate constipation.    Historical Provider, MD  mometasone-formoterol (DULERA) 200-5 MCG/ACT AERO Inhale 2 puffs into the lungs 2 (two) times daily. Patient not taking: Reported on 09/23/2016 04/01/16   Gladstone Lighter, MD    Inpatient Medications:  . acidophilus  1 capsule Oral Daily  . aspirin EC  81 mg Oral Daily  . azithromycin  500 mg Oral Daily  . cefTRIAXone (ROCEPHIN)  IV  2 g Intravenous Q24H  . cholecalciferol  5,000 Units Oral Daily  . diltiazem  300 mg Oral Daily  . ezetimibe  10 mg Oral Daily  . fluticasone  1 spray Each Nare Daily  . hydrochlorothiazide  12.5 mg Oral Daily  . loratadine  10 mg Oral Daily  . magnesium oxide  400 mg Oral Daily  . metoprolol tartrate  12.5 mg Oral BID  . mometasone-formoterol  2 puff Inhalation BID  . montelukast  10 mg Oral QHS  . multivitamin with minerals  2 tablet Oral Daily  . multivitamin-lutein   Oral Daily  . omega-3 acid ethyl esters  1 capsule Oral Daily  . sodium chloride flush  3 mL Intravenous Q12H  . vitamin B-12  3,000 mcg Oral Daily  . warfarin  4 mg Oral Daily  . Warfarin - Pharmacist Dosing Inpatient   Does  not apply q1800     Allergies:  Allergies  Allergen Reactions  . Amoxicillin Other (See Comments)    Lips swelling, tingling  . Benzocaine-Menthol     Throat swelling  . Biafine [Wound Dressings] Swelling  . Chloraseptic Sore Throat [Acetaminophen] Other (See Comments)    throat swelling  . Fosamax [Alendronate Sodium]   . Neosporin [Neomycin-Bacitracin Zn-Polymyx] Other (See Comments)    Skin redness, puffy  . Other     "chlorotrimitron"-- rxn: throat swelling  . Oxycodone Other (See Comments)    Dizziness, lips tingling  . Statins Other (See Comments)    Muscle weakness, weak  . Tape Other (See Comments)    Skin redness    Social History   Social History  . Marital status: Single    Spouse name: N/A  . Number of children: N/A  . Years of education: N/A   Occupational History  . Not on file.   Social History Main Topics  . Smoking status: Never Smoker  . Smokeless tobacco: Never Used  . Alcohol use No  . Drug use: No  . Sexual activity: Not Currently   Other Topics Concern  . Not on file   Social History Narrative  . No narrative on file     Family History  Problem Relation Age of Onset  . Pulmonary embolism Mother   . Arthritis Mother   . Hypertension Mother   . Heart attack Mother   . Breast cancer Mother 39  . Stomach cancer Maternal Aunt   . Throat cancer Maternal Uncle   . Stomach cancer Maternal Grandfather      Review of Systems Positive for Jaquelyn Bitter of breath cough congestion Negative for: General:  chills, fever, night sweats or weight changes.  Cardiovascular: PND orthopnea syncope dizziness  Dermatological skin lesions rashes Respiratory: Positive for Cough congestion Urologic: Frequent urination urination at night and hematuria Abdominal: negative for nausea, vomiting, diarrhea, bright red blood per rectum, melena, or hematemesis Neurologic: negative for visual changes, and/or hearing changes  All other systems reviewed and are  otherwise negative except as noted above.  Labs:  Recent Labs  09/23/16  [place] <0.03   Lab Results  Component Value Date   WBC 7.5 09/24/2016   HGB 14.2 09/24/2016   HCT 43.0 09/24/2016   MCV 93.5 09/24/2016   PLT 173 09/24/2016    Recent Labs Lab 09/23/16 1542 09/24/16 0532  NA 139 138  K 4.1 3.7  CL 102 108  CO2 28 25  BUN 18 18  CREATININE 1.04* 0.73  CALCIUM 9.5 8.6*  PROT 7.8  --   BILITOT 0.8  --   ALKPHOS 69  --   ALT 32  --   AST 41  --   GLUCOSE 137* 176*   No results found for: CHOL, HDL, LDLCALC, TRIG No results found for: DDIMER  Radiology/Studies:  Dg Chest 2 View  Result Date: 09/23/2016 CLINICAL DATA:  Cough, shortness breath, and fever. EXAM: CHEST  2 VIEW COMPARISON:  03/29/2016 FINDINGS: Heart size and pulmonary vascularity are normal. Peribronchial thickening. Slight atelectasis at the left lung base laterally. No infiltrates or effusions. No acute bone abnormality. IMPRESSION: Bronchitic changes with minimal atelectasis at the left base laterally. Electronically Signed   By: Lorriane Shire M.D.   On: 09/23/2016 15:47    EKG: Atrial fibrillation with controlled ventricular rate and nonspecific ST and T-wave changes  Weights: Filed Weights   09/23/16 1527 09/23/16 1855 09/24/16 0509  Weight: 123.4 kg (272 lb) 122.1 kg (269 lb 1.6 oz) 129.8 kg (286 lb 1.6 oz)     Physical Exam: Blood pressure (!) 100/44, pulse 99, temperature 98.6 F (37 C), temperature source Oral, resp. rate 18, height 5' 5.5" (1.664 m), weight 129.8 kg (286 lb 1.6 oz), SpO2 93 %. Body mass index is 46.89 kg/m. General: Well developed, well nourished, in no acute distress. Head eyes ears nose throat: Normocephalic, atraumatic, sclera non-icteric, no xanthomas, nares are without discharge. No apparent thyromegaly and/or mass  Lungs: Normal respiratory effort.  Diffuse wheezes, no rales, no rhonchi.  Heart: Irregular with normal S1 S2. no murmur gallop, no rub, PMI  is normal size and placement, carotid upstroke normal without bruit, jugular venous pressure is normal Abdomen: Soft, non-tender, non-distended with normoactive bowel sounds. No hepatomegaly. No rebound/guarding. No obvious abdominal masses. Abdominal aorta is normal size without bruit Extremities: Trace edema. no cyanosis, no clubbing, no ulcers  Peripheral : 2+ bilateral upper extremity pulses, 2+ bilateral femoral pulses, 2+ bilateral dorsal pedal pulse Neuro: Alert and oriented. No facial asymmetry. No focal deficit. Moves all extremities spontaneously. Musculoskeletal: Normal muscle tone without kyphosis Psych:  Responds to questions appropriately with a normal affect.    Assessment: 76 year old female with chronic nonvalvular atrial fibrillation sleep apnea essential hypertension mixed hyperlipidemia with atrial fibrillation with rapid ventricular rate secondary to illness hypoxia cough and congestion now improved on appropriate medication management  Plan: 1. Continue supportive care and treatment of the pneumonia and hypoxia 2. Continue anticoagulation although increasing dosage of warfarin for a goal INR between 2 and 3 3. Continue current dual medication management with metoprolol and diltiazem for heart rate control 4. No further cardiac diagnostics necessary at this time  Signed, Corey Skains M.D. [facility] 09/24/2016, 5:53 PM

## 2016-09-25 LAB — PROTIME-INR
INR: 2.11
Prothrombin Time: 24 seconds — ABNORMAL HIGH (ref 11.4–15.2)

## 2016-09-25 LAB — URINE CULTURE

## 2016-09-25 MED ORDER — PREDNISONE 50 MG PO TABS
50.0000 mg | ORAL_TABLET | Freq: Every day | ORAL | Status: DC
  Administered 2016-09-25 – 2016-09-26 (×2): 50 mg via ORAL
  Filled 2016-09-25 (×2): qty 1

## 2016-09-25 NOTE — Evaluation (Signed)
Physical Therapy Evaluation Patient Details Name: Laura Gonzalez MRN: [id number] DOB: 05/08/1941 Today's Date: 09/25/2016   History of Present Illness  76 yo female with onset of SOB and acute respiratory failure, sepsis, CAP.  Has plans to move to ALF but not until June 2018.  PMHx: HLD, skin CA, OA, hernia, osteoporosis, a-fib   Clinical Impression  Pt is up with PT to walk and practicing transfers at multiple heights.  Has modified her home with grab bars, has walkers and is looking forward to ALF admission.  Until then, needs assistance with home to transition although she can walk on walker with minor supervision for now.  Pulses are high and need to be monitored.  Continue acutely for progression of gait and strengthening, and to continue to see what further help at home may be needed.    Follow Up Recommendations Home health PT;Supervision - Intermittent;Supervision for mobility/OOB    Equipment Recommendations  None recommended by PT    Recommendations for Other Services       Precautions / Restrictions Precautions Precautions: Fall (telemetry) Restrictions Weight Bearing Restrictions: No      Mobility  Bed Mobility Overal bed mobility: Needs Assistance Bed Mobility: Supine to Sit     Supine to sit: Min assist     General bed mobility comments: minor help to support trunk and pt used HOB up and bedrail to get out  Transfers Overall transfer level: Needs assistance Equipment used: Rolling walker (2 wheeled);1 person hand held assist Transfers: Sit to/from [facility] Sit to Stand: Min guard;Mod assist (depending on height of surface, but pt has double bars in BR) Stand pivot transfers: Min guard       General transfer comment: pt is in less than ideal set up in her BR with no rail on L in hosp BR  Ambulation/Gait Ambulation/Gait assistance: Min guard Ambulation Distance (Feet): 30 Feet (+ 30) Assistive device: Rolling walker (2 wheeled);1  person hand held assist Gait Pattern/deviations: Step-through pattern;Wide base of support;Shuffle;Decreased stride length Gait velocity: reduced Gait velocity interpretation: Below normal speed for age/gender General Gait Details: pt had to navigate crowded room but was aware of her limits  Stairs            Wheelchair Mobility    Modified Rankin (Stroke Patients Only)       Balance Overall balance assessment: Needs assistance Sitting-balance support: Feet supported;Single extremity supported Sitting balance-Leahy Scale: Good     Standing balance support: Bilateral upper extremity supported Standing balance-Leahy Scale: Fair                               Pertinent Vitals/Pain Pain Assessment: No/denies pain    Home Living Family/patient expects to be discharged to:: Private residence Living Arrangements: Alone Available Help at Discharge: Neighbor;Available PRN/intermittently Type of Home: House Home Access: Stairs to enter Entrance Stairs-Rails: Right Entrance Stairs-Number of Steps: 1 Home Layout: Able to live on main level with bedroom/bathroom Home Equipment: Walker - 2 wheels;Cane - single point (tripod walker)      Prior Function Level of Independence: Independent with assistive device(s)         Comments: previously limited community ambulator, still drives     Hand Dominance        Extremity/Trunk Assessment   Upper Extremity Assessment Upper Extremity Assessment: Overall WFL for tasks assessed    Lower Extremity Assessment Lower Extremity Assessment: Overall WFL for  tasks assessed    Cervical / Trunk Assessment Cervical / Trunk Assessment: Normal  Communication   Communication: No difficulties  Cognition Arousal/Alertness: Awake/alert Behavior During Therapy: WFL for tasks assessed/performed Overall Cognitive Status: Within Functional Limits for tasks assessed                                         General Comments General comments (skin integrity, edema, etc.): pulses ranged from 90 to 118 with mobility, off meds for controlling pulse    Exercises     Assessment/Plan    PT Assessment Patient needs continued PT services  PT Problem List Decreased strength;Decreased range of motion;Decreased activity tolerance;Decreased balance;Decreased mobility;Decreased coordination;Decreased knowledge of use of DME;Decreased safety awareness;Cardiopulmonary status limiting activity;Obesity;Decreased skin integrity       PT Treatment Interventions DME instruction;Gait training;Stair training;Functional mobility training;Therapeutic activities;Therapeutic exercise;Balance training;Neuromuscular re-education;Patient/family education    PT Goals (Current goals can be found in the Care Plan section)  Acute Rehab PT Goals Patient Stated Goal: to get home and move to ALF when bed ready PT Goal Formulation: With patient Time For Goal Achievement: 10/09/16 Potential to Achieve Goals: Good    Frequency Min 2X/week   Barriers to discharge Decreased caregiver support home alone usually and not ready to get into ALF yet    Co-evaluation               End of Session Equipment Utilized During Treatment: Gait belt Activity Tolerance: Patient tolerated treatment well Patient left: in chair;with call bell/phone within reach;with chair alarm set Nurse Communication: Mobility status (telemetry unit lead is off) PT Visit Diagnosis: Unsteadiness on feet (R26.81);Difficulty in walking, not elsewhere classified (R26.2)    Time: 1017-5102 PT Time Calculation (min) (ACUTE ONLY): 33 min   Charges:   PT Evaluation $PT Eval Moderate Complexity: 1 Procedure PT Treatments $Gait Training: 8-22 mins   PT G Codes:   PT G-Codes **NOT FOR INPATIENT CLASS** Functional Assessment Tool Used: AM-PAC 6 Clicks Basic Mobility    Ramond Dial 09/25/2016, 10:41 AM   Mee Hives, PT MS Acute Rehab Dept. Number:  [place] [facility] [place]

## 2016-09-25 NOTE — Care Management (Signed)
patient is agreeable for home health SN and PT at discharge.  Agency preference is Nurse, learning disability.  Heads up referral for SN and PT.

## 2016-09-25 NOTE — Care Management Important Message (Signed)
Important Message  Patient Details  Name: Laura Gonzalez MRN: [id number] Date of Birth: 1940/11/30   Medicare Important Message Given:  Yes  Signed IM notice given    Katrina Stack, RN 09/25/2016, 9:44 AM

## 2016-09-25 NOTE — Progress Notes (Signed)
[facility] Cardiology [facility] Encounter Note  Patient: Laura Gonzalez / Admit Date: 09/23/2016 / Date of Encounter: 09/25/2016, 7:23 AM   Subjective: Patient still has some cough and wheezing with shortness of breath slightly improved from yesterday but no evidence of hypoxia. The patient has good oxygenation and no evidence of myocardial infarction with normal troponin. Will fibrillation appears chronic with good heart rate control today  Review of Systems: Positive for: Cough congestion and wheezing Negative for: Vision change, hearing change, syncope, dizziness, nausea, vomiting,diarrhea, bloody stool, stomach pain,  diaphoresis, urinary frequency, urinary pain,skin lesions, skin rashes Others previously listed  Objective: Telemetry: Atrial fibrillation with controlled ventricular rate Physical Exam: Blood pressure (!) 101/44, pulse 88, temperature 98.4 F (36.9 C), temperature source Oral, resp. rate 18, height 5' 5.5" (1.664 m), weight 125.8 kg (277 lb 6.4 oz), SpO2 91 %. Body mass index is 45.46 kg/m. General: Well developed, well nourished, in no acute distress. Head: Normocephalic, atraumatic, sclera non-icteric, no xanthomas, nares are without discharge. Neck: No apparent masses Lungs: Normal respirations with diffuse wheezes, no rhonchi, no rales , few crackles   Heart: Irregular rate and rhythm, normal S1 S2, no murmur, no rub, no gallop, PMI is normal size and placement, carotid upstroke normal without bruit, jugular venous pressure normal Abdomen: Soft, non-tender, non-distended with normoactive bowel sounds. No hepatosplenomegaly. Abdominal aorta is normal size without bruit Extremities: No edema, no clubbing, no cyanosis, no ulcers,  Peripheral: 2+ radial, 2+ femoral, 2+ dorsal pedal pulses Neuro: Alert and oriented. Moves all extremities spontaneously. Psych:  Responds to questions appropriately with a normal affect.   Intake/Output Summary (Last 24 hours) at 09/25/16  0723 Last data filed at 09/25/16 0357  Gross per 24 hour  Intake             1100 ml  Output             1000 ml  Net              100 ml    Inpatient Medications:  . acidophilus  1 capsule Oral Daily  . aspirin EC  81 mg Oral Daily  . azithromycin  500 mg Oral Daily  . cefTRIAXone (ROCEPHIN)  IV  2 g Intravenous Q24H  . cholecalciferol  5,000 Units Oral Daily  . diltiazem  300 mg Oral Daily  . ezetimibe  10 mg Oral Daily  . fluticasone  1 spray Each Nare Daily  . hydrochlorothiazide  12.5 mg Oral Daily  . loratadine  10 mg Oral Daily  . magnesium oxide  400 mg Oral Daily  . metoprolol tartrate  12.5 mg Oral BID  . mometasone-formoterol  2 puff Inhalation BID  . montelukast  10 mg Oral QHS  . multivitamin with minerals  2 tablet Oral Daily  . multivitamin-lutein   Oral Daily  . omega-3 acid ethyl esters  1 capsule Oral Daily  . sodium chloride flush  3 mL Intravenous Q12H  . vitamin B-12  3,000 mcg Oral Daily  . warfarin  4 mg Oral Daily  . Warfarin - Pharmacist Dosing Inpatient   Does not apply q1800   Infusions:   Labs:  Recent Labs  09/23/16 1542 09/24/16 0532  NA 139 138  K 4.1 3.7  CL 102 108  CO2 28 25  GLUCOSE 137* 176*  BUN 18 18  CREATININE 1.04* 0.73  CALCIUM 9.5 8.6*  MG 2.0  --     Recent Labs  09/23/16 1542  AST 41  ALT 32  ALKPHOS 69  BILITOT 0.8  PROT 7.8  ALBUMIN 3.9    Recent Labs  09/23/16 1542 09/24/16 0532  WBC 13.0* 7.5  NEUTROABS 10.3*  --   HGB 16.2* 14.2  HCT 48.2* 43.0  MCV 92.7 93.5  PLT 206 173    Recent Labs  09/23/16 1542  TROPONINI <0.03   Invalid input(s): POCBNP No results for input(s): HGBA1C in the last 72 hours.   Weights: Filed Weights   09/23/16 1855 09/24/16 0509 09/25/16 0354  Weight: 122.1 kg (269 lb 1.6 oz) 129.8 kg (286 lb 1.6 oz) 125.8 kg (277 lb 6.4 oz)     Radiology/Studies:  Dg Chest 2 View  Result Date: 09/23/2016 CLINICAL DATA:  Cough, shortness breath, and fever. EXAM: CHEST  2  VIEW COMPARISON:  03/29/2016 FINDINGS: Heart size and pulmonary vascularity are normal. Peribronchial thickening. Slight atelectasis at the left lung base laterally. No infiltrates or effusions. No acute bone abnormality. IMPRESSION: Bronchitic changes with minimal atelectasis at the left base laterally. Electronically Signed   By: Lorriane Shire M.D.   On: 09/23/2016 15:47     Assessment and Recommendation  76 y.o. female with known chronic nonvalvular atrial fibrillation sleep apnea essential hypertension with the atrial fibrillation with rapid ventricular rate due to recent pneumonia and hypoxia needing further treatment options 1. Continue diltiazem and metoprolol combination for heart rate control without change today 2. Adjustment of warfarin dose for goal INR between 2 and 3 with the current use of antibiotics to reduce risk of stroke 3. Contin U supportive care with antibiotics and inhalers of pneumonia 4. No further cardiac diagnostics at this time 5. Ambulation and further adjustments of medications as necessary with possible cardiology follow-up next week for adjustments of medications after discharge  Signed, Serafina Royals M.D. FACC

## 2016-09-25 NOTE — Progress Notes (Signed)
[facility] [place] NAME: Amiri Tritch    MR#:  [id number]  DATE OF BIRTH:  June 17, 1941  SUBJECTIVE:  CHIEF COMPLAINT:   Chief Complaint  Patient presents with  . Cough  . Fever   Better cough and shortness of breath. Wheezing.  Off Oxygen and Cardizem drip. HR is controlled. REVIEW OF SYSTEMS:  Review of Systems  Constitutional: Positive for malaise/fatigue. Negative for chills and fever.  HENT: Negative for congestion.   Eyes: Negative for blurred vision and double vision.  Respiratory: Positive for cough, sputum production and shortness of breath. Negative for hemoptysis, wheezing and stridor.   Cardiovascular: Negative for chest pain and leg swelling.  Gastrointestinal: Negative for abdominal pain, blood in stool, constipation, diarrhea, melena, nausea and vomiting.  Genitourinary: Negative for dysuria and hematuria.  Musculoskeletal: Negative for back pain.  Skin: Negative for itching and rash.  Neurological: Positive for weakness. Negative for dizziness, focal weakness and loss of consciousness.  Psychiatric/Behavioral: Negative for depression. The patient does not have insomnia.     DRUG ALLERGIES:   Allergies  Allergen Reactions  . Amoxicillin Other (See Comments)    Lips swelling, tingling  . Benzocaine-Menthol     Throat swelling  . Biafine [Wound Dressings] Swelling  . Chloraseptic Sore Throat [Acetaminophen] Other (See Comments)    throat swelling  . Fosamax [Alendronate Sodium]   . Neosporin [Neomycin-Bacitracin Zn-Polymyx] Other (See Comments)    Skin redness, puffy  . Other     "chlorotrimitron"-- rxn: throat swelling  . Oxycodone Other (See Comments)    Dizziness, lips tingling  . Statins Other (See Comments)    Muscle weakness, weak  . Tape Other (See Comments)    Skin redness   VITALS:  Blood pressure (!) 105/56, pulse 88, temperature 98.4 F (36.9 C), temperature source Oral, resp. rate 18, height  5' 5.5" (1.664 m), weight 277 lb 6.4 oz (125.8 kg), SpO2 95 %. PHYSICAL EXAMINATION:  Physical Exam  Constitutional: She is oriented to person, place, and time and well-developed, well-nourished, and in no distress.  HENT:  Head: Normocephalic.  Mouth/Throat: Oropharynx is clear and moist.  Eyes: Conjunctivae and EOM are normal.  Neck: Normal range of motion. Neck supple. No JVD present. No tracheal deviation present.  Cardiovascular: Normal heart sounds.  Exam reveals no gallop.   No murmur heard. Irregular rate and rhythm.  Pulmonary/Chest: Effort normal. No respiratory distress. She has wheezes. She has no rales.  Abdominal: Soft. Bowel sounds are normal. She exhibits no distension. There is no tenderness.  Musculoskeletal: She exhibits no edema.  Neurological: She is alert and oriented to person, place, and time. No cranial nerve deficit.  Skin: No rash noted. No erythema.  Psychiatric: Affect normal.   LABORATORY PANEL:  Female CBC  Recent Labs Lab 09/24/16 0532  WBC 7.5  HGB 14.2  HCT 43.0  PLT 173   ------------------------------------------------------------------------------------------------------------------ Chemistries   Recent Labs Lab 09/23/16 1542 09/24/16 0532  NA 139 138  K 4.1 3.7  CL 102 108  CO2 28 25  GLUCOSE 137* 176*  BUN 18 18  CREATININE 1.04* 0.73  CALCIUM 9.5 8.6*  MG 2.0  --   AST 41  --   ALT 32  --   ALKPHOS 69  --   BILITOT 0.8  --    RADIOLOGY:  No results found. ASSESSMENT AND PLAN:   76 year old female with atrial fibrillation and asthma who presents with disturbance of  breath and found to have asthma exacerbation on top of atrial fibrillation with RVR.  1.chronic atrial fibrillation with RVR: Patient given diltiazem 60 mg In the emergency room and started on diltiazem gtt. She is on cardizem po, discontinued gtt, Continue Metoprolol and Coumadin Continue diltiazem and metoprolol combination for heart rate control without  change today. No further cardiac diagnostics at this time per Dr. Nehemiah Massed.   2. Acute hypoxic respiratory failure due to Asthma  exacerbation:  disontinued IV steroids. On prednisone. Continue inhalers and NEB  3.Sepsis due to CAP:She presents with tachycardia, fever and elevated WBC. Continue Rocephin and Azithromycin. Blood cultures: GPC, coagulase negative staphylococcus, possible contamination. lactic acid levels is normal.  4. HTN: Continue Meoprolol and HCTZ 5. Morbid obesity due to excess calories: Weight loss as tolerated 6. OSA: CPAP at night  All the records are reviewed and case discussed with Care Management/Social Worker. Management plans discussed with the patient, family and they are in agreement.  CODE STATUS: Partial Code  TOTAL TIME TAKING CARE OF THIS PATIENT: 32 minutes.   More than 50% of the time was spent in counseling/coordination of care: YES  POSSIBLE D/C IN 1-2 DAYS, DEPENDING ON CLINICAL CONDITION.   Demetrios Loll M.D on 09/25/2016 at 1:32 PM  Between 7am to 6pm - Pager - [phone]  After 6pm go to [email or web address] - Proofreader  [facility]  Office  [phone]  CC: Primary care physician; [name], MD  Note: This dictation was prepared with Dragon dictation along with smaller phrase technology. Any transcriptional errors that result from this process are unintentional.

## 2016-09-25 NOTE — Progress Notes (Signed)
[facility] for Warfarin Indication: atrial fibrillation  Allergies  Allergen Reactions  . Amoxicillin Other (See Comments)    Lips swelling, tingling  . Benzocaine-Menthol     Throat swelling  . Biafine [Wound Dressings] Swelling  . Chloraseptic Sore Throat [Acetaminophen] Other (See Comments)    throat swelling  . Fosamax [Alendronate Sodium]   . Neosporin [Neomycin-Bacitracin Zn-Polymyx] Other (See Comments)    Skin redness, puffy  . Other     "chlorotrimitron"-- rxn: throat swelling  . Oxycodone Other (See Comments)    Dizziness, lips tingling  . Statins Other (See Comments)    Muscle weakness, weak  . Tape Other (See Comments)    Skin redness    Patient Measurements: Height: 5' 5.5" (166.4 cm) Weight: 277 lb 6.4 oz (125.8 kg) IBW/kg (Calculated) : 58.15 Heparin Dosing Weight:    Vital Signs: Temp: 98.4 F (36.9 C) (04/06 0354) Temp Source: Oral (04/06 0354) BP: 105/56 (04/06 1000) Pulse Rate: 88 (04/06 0354)  Labs:  Recent Labs  09/23/16 1542 09/24/16 0532 09/25/16 0343  HGB 16.2* 14.2  --   HCT 48.2* 43.0  --   PLT 206 173  --   LABPROT 19.0* 19.4* 24.0*  INR 1.58 1.62 2.11  CREATININE 1.04* 0.73  --   TROPONINI <0.03  --   --     Estimated Creatinine Clearance: 83 mL/min (by C-G formula based on SCr of 0.73 mg/dL).   Medical History: Past Medical History:  Diagnosis Date  . Arthritis    wrist and knees  . Asthma   . Cancer ([place])    skin cancer  . Carpal tunnel syndrome   . Chicken pox   . Degenerative arthritis of knee, bilateral   . Diverticulitis   . Diverticulosis   . Dysrhythmia    afib  . GERD (gastroesophageal reflux disease)   . Hernia, umbilical   . Hyperlipidemia   . Hypertension   . Numbness and tingling    arm to leg  . Osteoporosis   . Pneumonia   . Shortness of breath    only with astma attacks  . Sleep apnea     Medications:  Taking Warfarin 4mg  daily prior to  admission  Assessment: Patient is a 76yo female with history of afib to be continued on Warfarin, pharmacy consulted for management.  4/4 INR 1.58  Warfarin 4mg   4/5 INR 1.62  Warfarin 4mg  4/6 INR 2.11  Goal of Therapy:  INR 2-3   Plan:  INR is therapeutic today. Patient is currently receiving antibiotics which may increase INR, will need to watch for this. Will continue with home dose of Warfarin 4mg . Daily INR checks.  Paulina Fusi, PharmD, BCPS 09/25/2016 1:47 PM

## 2016-09-26 LAB — PROTIME-INR
INR: 2.4
PROTHROMBIN TIME: 26.6 s — AB (ref 11.4–15.2)

## 2016-09-26 LAB — CULTURE, BLOOD (ROUTINE X 2): SPECIAL REQUESTS: ADEQUATE

## 2016-09-26 MED ORDER — PREDNISONE 10 MG PO TABS: ORAL_TABLET | ORAL | 0 refills | Status: DC

## 2016-09-26 MED ORDER — DM-GUAIFENESIN ER 30-600 MG PO TB12: 1.0000 | ORAL_TABLET | Freq: Two times a day (BID) | ORAL | 0 refills | Status: DC | PRN

## 2016-09-26 MED ORDER — DM-GUAIFENESIN ER 30-600 MG PO TB12
1.0000 | ORAL_TABLET | Freq: Two times a day (BID) | ORAL | Status: DC | PRN
  Filled 2016-09-26: qty 1

## 2016-09-26 MED ORDER — AZITHROMYCIN 500 MG PO TABS: 500.0000 mg | ORAL_TABLET | Freq: Every day | ORAL | 0 refills | Status: DC

## 2016-09-26 NOTE — Care Management Note (Signed)
Case Management Note  Patient Details  Name: Laura Gonzalez MRN: [id number] Date of Birth: July 17, 1940  Subjective/Objective:     Discussed discharge planning with Ms [place]. Ms Pokorney chose [facility] per she had used them in the past. A referral for HH-PT,RN was called to [place] [facility].                Action/Plan:   Expected Discharge Date:  09/26/16               Expected Discharge Plan:     In-House Referral:     Discharge planning Services     Post Acute Care Choice:    Choice offered to:     DME Arranged:    DME Agency:     HH Arranged:    HH Agency:     Status of Service:     If discussed at [employer] [facility] [employer], dates discussed:    Additional Comments:  Cinnamon Morency A, RN 09/26/2016, 10:37 AM

## 2016-09-26 NOTE — Discharge Summary (Signed)
[facility] NAME: Laura Gonzalez    MR#:  [id number]  DATE OF BIRTH:  [birth year]  DATE OF ADMISSION:  09/23/2016   ADMITTING PHYSICIAN: Bettey Costa, MD  DATE OF DISCHARGE:  09/26/2016 PRIMARY CARE PHYSICIAN: [name], MD   ADMISSION DIAGNOSIS:  Atrial fibrillation with rapid ventricular response (HCC) [I48.91] Severe sepsis ([place]) [A41.9, R65.20] Acute bronchitis, unspecified organism [J20.9] Community acquired pneumonia, unspecified laterality [J18.9] DISCHARGE DIAGNOSIS:  Active Problems:   Sepsis ([place])  Acute hypoxic respiratory failure due to Asthma  exacerbation epsis due to CAP chronic atrial fibrillation with RVR SECONDARY DIAGNOSIS:   Past Medical History:  Diagnosis Date  . Arthritis    wrist and knees  . Asthma   . Cancer ([place])    skin cancer  . Carpal tunnel syndrome   . Chicken pox   . Degenerative arthritis of knee, bilateral   . Diverticulitis   . Diverticulosis   . Dysrhythmia    afib  . GERD (gastroesophageal reflux disease)   . Hernia, umbilical   . Hyperlipidemia   . Hypertension   . Numbness and tingling    arm to leg  . Osteoporosis   . Pneumonia   . Shortness of breath    only with astma attacks  . Sleep apnea    HOSPITAL COURSE:   76 year old female with atrial fibrillation and asthma who presents with disturbance of breath and found to have asthma exacerbation on top of atrial fibrillation with RVR.  1.chronic atrial fibrillation with RVR: Patient given diltiazem 60 mg In the emergency room and started on diltiazem gtt. She is on cardizem po, discontinued gtt, Continue Metoprolol and Coumadin Continue diltiazem and metoprolol combination for heart rate control without change today. No further cardiac diagnostics at this time per Dr. Nehemiah Massed.   2. Acute hypoxic respiratory failure due to Asthma  exacerbation:  disontinued IV steroids. On prednisone taper. Continue inhalers  and NEB  3.Sepsis due to CAP:She presents with tachycardia, fever and elevated WBC. She is treated with Rocephin and Azithromycin. Continue zithromax for 3 more days. Blood cultures: GPC, coagulase negative staphylococcus, possible contamination. lactic acid levels is normal.  4. HTN: Continue Meoprolol and HCTZ 5. Morbid obesity due to excess calories: Weight loss as tolerated 6. OSA: CPAP at night  DISCHARGE CONDITIONS:  Stable, discharge to home with home health and PT today. CONSULTS OBTAINED:  Treatment Team:  Corey Skains, MD DRUG ALLERGIES:   Allergies  Allergen Reactions  . Amoxicillin Other (See Comments)    Lips swelling, tingling  . Benzocaine-Menthol     Throat swelling  . Biafine [Wound Dressings] Swelling  . Chloraseptic Sore Throat [Acetaminophen] Other (See Comments)    throat swelling  . Fosamax [Alendronate Sodium]   . Neosporin [Neomycin-Bacitracin Zn-Polymyx] Other (See Comments)    Skin redness, puffy  . Other     "chlorotrimitron"-- rxn: throat swelling  . Oxycodone Other (See Comments)    Dizziness, lips tingling  . Statins Other (See Comments)    Muscle weakness, weak  . Tape Other (See Comments)    Skin redness   DISCHARGE MEDICATIONS:   Allergies as of 09/26/2016      Reactions   Amoxicillin Other (See Comments)   Lips swelling, tingling   Benzocaine-menthol    Throat swelling   Biafine [wound Dressings] Swelling   Chloraseptic Sore Throat [acetaminophen] Other (See Comments)   throat swelling   Fosamax [alendronate  Sodium]    Neosporin [neomycin-bacitracin Zn-polymyx] Other (See Comments)   Skin redness, puffy   Other    "chlorotrimitron"-- rxn: throat swelling   Oxycodone Other (See Comments)   Dizziness, lips tingling   Statins Other (See Comments)   Muscle weakness, weak   Tape Other (See Comments)   Skin redness      Medication List    TAKE these medications   acetaminophen 325 MG tablet Commonly known as:   TYLENOL Take 650 mg by mouth every 6 (six) hours as needed for mild pain, moderate pain or headache.   acidophilus Caps capsule Take 1 capsule by mouth daily.   albuterol (2.5 MG/3ML) 0.083% nebulizer solution Commonly known as:  PROVENTIL Take 2.5 mg by nebulization every 6 (six) hours as needed for wheezing or shortness of breath.   albuterol 108 (90 Base) MCG/ACT inhaler Commonly known as:  PROVENTIL HFA;VENTOLIN HFA Inhale 2 puffs into the lungs every 6 (six) hours as needed. For shortness of breath   aspirin EC 81 MG tablet Take 81 mg by mouth daily.   AYR SALINE NASAL GEL NA Place 1 application into the nose as needed.   azithromycin 500 MG tablet Commonly known as:  ZITHROMAX Take 1 tablet (500 mg total) by mouth daily.   bisacodyl 5 MG EC tablet Generic drug:  bisacodyl Take 5 mg by mouth daily as needed for moderate constipation.   CITRACAL PO Take 2 tablets by mouth daily.   [employer] HEARTBURN RELIEF PO Take 1 tablet by mouth as needed.   dextromethorphan-guaiFENesin 30-600 MG 12hr tablet Commonly known as:  MUCINEX DM Take 1 tablet by mouth 2 (two) times daily as needed for cough.   diltiazem 300 MG 24 hr capsule Commonly known as:  CARDIZEM CD Take 300 mg by mouth daily.   diltiazem 60 MG tablet Commonly known as:  CARDIZEM Take 60 mg by mouth every 6 (six) hours as needed. Takes if has palpitation after taking diltiazem 300mg  dose   diphenhydrAMINE 25 MG tablet Commonly known as:  BENADRYL Take 25 mg by mouth every 6 (six) hours as needed.   ezetimibe 10 MG tablet Commonly known as:  ZETIA Take 10 mg by mouth daily.   fluticasone 50 MCG/ACT nasal spray Commonly known as:  FLONASE Place 1 spray into both nostrils daily.   hydrochlorothiazide 12.5 MG tablet Commonly known as:  HYDRODIURIL Take 1 tablet (12.5 mg total) by mouth daily.   loratadine 10 MG tablet Commonly known as:  CLARITIN Take 10 mg by mouth daily.   magnesium oxide 400 MG  tablet Commonly known as:  MAG-OX Take 400 mg by mouth daily.   MEGARED OMEGA-3 KRILL OIL 500 MG Caps Take 1 capsule by mouth daily.   metoprolol tartrate 25 MG tablet Commonly known as:  LOPRESSOR Take 12.5 mg by mouth 2 (two) times daily.   mometasone-formoterol 200-5 MCG/ACT Aero Commonly known as:  DULERA Inhale 2 puffs into the lungs 2 (two) times daily.   montelukast 10 MG tablet Commonly known as:  SINGULAIR Take 10 mg by mouth at bedtime.   multivitamin with minerals Tabs tablet Take 2 tablets by mouth daily.   potassium chloride SA 20 MEQ tablet Commonly known as:  K-DUR,KLOR-CON Take 1 tablet (20 mEq total) by mouth 2 (two) times daily. What changed:  when to take this   predniSONE 10 MG tablet Commonly known as:  DELTASONE 40 mg po daily for 2 days, 20 mg po daily for 2  days, 10 mg po daily for 3 days.   PRESERVISION AREDS PO Take 1 tablet by mouth daily.   trolamine salicylate 10 % cream Commonly known as:  ASPERCREME Apply 1 application topically as needed for muscle pain.   vitamin B-12 1000 MCG tablet Commonly known as:  CYANOCOBALAMIN Take 3,000 mcg by mouth daily.   Vitamin D3 5000 units Caps Take 1 capsule by mouth daily.   warfarin 2 MG tablet Commonly known as:  COUMADIN Take 1 tablet (2 mg total) by mouth daily. What changed:  how much to take        DISCHARGE INSTRUCTIONS:  See AVS.  If you experience worsening of your admission symptoms, develop shortness of breath, life threatening emergency, suicidal or homicidal thoughts you must seek medical attention immediately by calling 911 or calling your MD immediately  if symptoms less severe.  You Must read complete instructions/literature along with all the possible adverse reactions/side effects for all the Medicines you take and that have been prescribed to you. Take any new Medicines after you have completely understood and accpet all the possible adverse reactions/side effects.    Please note  You were cared for by a hospitalist during your hospital stay. If you have any questions about your discharge medications or the care you received while you were in the hospital after you are discharged, you can call the unit and asked to speak with the hospitalist on call if the hospitalist that took care of you is not available. Once you are discharged, your primary care physician will handle any further medical issues. Please note that NO REFILLS for any discharge medications will be authorized once you are discharged, as it is imperative that you return to your primary care physician (or establish a relationship with a primary care physician if you do not have one) for your aftercare needs so that they can reassess your need for medications and monitor your lab values.    On the day of Discharge:  VITAL SIGNS:  Blood pressure (!) 121/53, pulse (!) 54, temperature 97.5 F (36.4 C), temperature source Oral, resp. rate 18, height 5' 5.5" (1.664 m), weight 275 lb 12.8 oz (125.1 kg), SpO2 93 %. PHYSICAL EXAMINATION:  GENERAL:  76 y.o.-year-old patient lying in the bed with no acute distress.  EYES: Pupils equal, round, reactive to light and accommodation. No scleral icterus. Extraocular muscles intact.  HEENT: Head atraumatic, normocephalic. Oropharynx and nasopharynx clear.  NECK:  Supple, no jugular venous distention. No thyroid enlargement, no tenderness.  LUNGS: Normal breath sounds bilaterally, no wheezing, rales,rhonchi or crepitation. No use of accessory muscles of respiration.  CARDIOVASCULAR: S1, S2 normal. No murmurs, rubs, or gallops.  ABDOMEN: Soft, non-tender, non-distended. Bowel sounds present. No organomegaly or mass.  EXTREMITIES: No pedal edema, cyanosis, or clubbing.  NEUROLOGIC: Cranial nerves II through XII are intact. Muscle strength 5/5 in all extremities. Sensation intact. Gait not checked.  PSYCHIATRIC: The patient is alert and oriented x 3.  SKIN: No  obvious rash, lesion, or ulcer.  DATA REVIEW:   CBC  Recent Labs Lab 09/24/16 0532  WBC 7.5  HGB 14.2  HCT 43.0  PLT 173    Chemistries   Recent Labs Lab 09/23/16 1542 09/24/16 0532  NA 139 138  K 4.1 3.7  CL 102 108  CO2 28 25  GLUCOSE 137* 176*  BUN 18 18  CREATININE 1.04* 0.73  CALCIUM 9.5 8.6*  MG 2.0  --   AST 41  --  ALT 32  --   ALKPHOS 69  --   BILITOT 0.8  --      Microbiology Results  Results for orders placed or performed during the hospital encounter of 09/23/16  Blood Culture (routine x 2)     Status: None (Preliminary result)   Collection Time: 09/23/16  3:42 PM  Result Value Ref Range Status   Specimen Description BLOOD LEFT AC  Final   Special Requests   Final    BOTTLES DRAWN AEROBIC AND ANAEROBIC Blood Culture results may not be optimal due to an excessive volume of blood received in culture bottles   Culture NO GROWTH 3 DAYS  Final   Report Status PENDING  Incomplete  Blood Culture (routine x 2)     Status: Abnormal   Collection Time: 09/23/16  3:42 PM  Result Value Ref Range Status   Specimen Description BLOOD LEFT HAND  Final   Special Requests   Final    BOTTLES DRAWN AEROBIC AND ANAEROBIC Blood Culture adequate volume   Culture  Setup Time   Final    GRAM POSITIVE COCCI IN BOTH AEROBIC AND ANAEROBIC BOTTLES CRITICAL RESULT CALLED TO, READ BACK BY AND VERIFIED WITH: KAREN HAYES ON 09/24/16 AT 1109 QSD    Culture (A)  Final    STAPHYLOCOCCUS SPECIES (COAGULASE NEGATIVE) THE SIGNIFICANCE OF ISOLATING THIS ORGANISM FROM A SINGLE SET OF BLOOD CULTURES WHEN MULTIPLE SETS ARE DRAWN IS UNCERTAIN. PLEASE NOTIFY THE MICROBIOLOGY DEPARTMENT WITHIN ONE WEEK IF SPECIATION AND SENSITIVITIES ARE REQUIRED. Performed at [facility], [place] [address], [place], [place] [address]    Report Status 09/26/2016 FINAL  Final  Urine culture     Status: Abnormal   Collection Time: 09/23/16  3:42 PM  Result Value Ref Range Status   Specimen  Description URINE, CLEAN CATCH  Final   Special Requests NONE  Final   Culture MULTIPLE SPECIES PRESENT, SUGGEST RECOLLECTION (A)  Final   Report Status 09/25/2016 FINAL  Final  Blood Culture ID Panel (Reflexed)     Status: Abnormal   Collection Time: 09/23/16  3:42 PM  Result Value Ref Range Status   Enterococcus species NOT DETECTED NOT DETECTED Final   Listeria monocytogenes NOT DETECTED NOT DETECTED Final   Staphylococcus species DETECTED (A) NOT DETECTED Final    Comment: Methicillin (oxacillin) resistant coagulase negative staphylococcus. Possible blood culture contaminant (unless isolated from more than one blood culture draw or clinical case suggests pathogenicity). No antibiotic treatment is indicated for blood  culture contaminants. CRITICAL RESULT CALLED TO, READ BACK BY AND VERIFIED WITH: KAREN HAYES ON 09/24/16 AT 1109 QSD    Staphylococcus aureus NOT DETECTED NOT DETECTED Final   Methicillin resistance DETECTED (A) NOT DETECTED Final    Comment: CRITICAL RESULT CALLED TO, READ BACK BY AND VERIFIED WITH: KAREN HAYES ON 09/24/16 AT 1109 QSD    Streptococcus species NOT DETECTED NOT DETECTED Final   Streptococcus agalactiae NOT DETECTED NOT DETECTED Final   Streptococcus pneumoniae NOT DETECTED NOT DETECTED Final   Streptococcus pyogenes NOT DETECTED NOT DETECTED Final   Acinetobacter baumannii NOT DETECTED NOT DETECTED Final   Enterobacteriaceae species NOT DETECTED NOT DETECTED Final   Enterobacter cloacae complex NOT DETECTED NOT DETECTED Final   Escherichia coli NOT DETECTED NOT DETECTED Final   Klebsiella oxytoca NOT DETECTED NOT DETECTED Final   Klebsiella pneumoniae NOT DETECTED NOT DETECTED Final   Proteus species NOT DETECTED NOT DETECTED Final   Serratia marcescens NOT DETECTED NOT DETECTED  Final   Haemophilus influenzae NOT DETECTED NOT DETECTED Final   Neisseria meningitidis NOT DETECTED NOT DETECTED Final   Pseudomonas aeruginosa NOT DETECTED NOT DETECTED Final    Candida albicans NOT DETECTED NOT DETECTED Final   Candida glabrata NOT DETECTED NOT DETECTED Final   Candida krusei NOT DETECTED NOT DETECTED Final   Candida parapsilosis NOT DETECTED NOT DETECTED Final   Candida tropicalis NOT DETECTED NOT DETECTED Final    RADIOLOGY:  No results found.   Management plans discussed with the patient, family and they are in agreement.  CODE STATUS: Partial Code   TOTAL TIME TAKING CARE OF THIS PATIENT: 35 minutes.    Demetrios Loll M.D on 09/26/2016 at 11:40 AM  Between 7am to 6pm - Pager - [phone]  After 6pm go to [email or web address] - Proofreader  [facility]  Office  [phone]  CC: Primary care physician; [name], MD   Note: This dictation was prepared with Dragon dictation along with smaller phrase technology. Any transcriptional errors that result from this process are unintentional.

## 2016-09-26 NOTE — Discharge Instructions (Signed)
HHPT CPAP at night

## 2016-09-26 NOTE — Progress Notes (Signed)
[facility] Cardiology [facility] Encounter Note  Patient: Laura Gonzalez / Admit Date: 09/23/2016 / Date of Encounter: 09/26/2016, 6:11 AM   Subjective: Patient still has some cough and wheezing with shortness of breath slightly improved from yesterday but no evidence of hypoxia overnight. Patient is resting well. The patient has good oxygenation and no evidence of myocardial infarction with normal troponin. Atrial fibrillation seems well controlled even with ambulation to the chair  Review of Systems: Positive for: Cough congestion and wheezing Negative for: Vision change, hearing change, syncope, dizziness, nausea, vomiting,diarrhea, bloody stool, stomach pain,  diaphoresis, urinary frequency, urinary pain,skin lesions, skin rashes Others previously listed  Objective: Telemetry: Atrial fibrillation with controlled ventricular rate Physical Exam: Blood pressure (!) 121/53, pulse (!) 54, temperature 97.5 F (36.4 C), temperature source Oral, resp. rate 18, height 5' 5.5" (1.664 m), weight 125.1 kg (275 lb 12.8 oz), SpO2 93 %. Body mass index is 45.2 kg/m. General: Well developed, well nourished, in no acute distress. Head: Normocephalic, atraumatic, sclera non-icteric, no xanthomas, nares are without discharge. Neck: No apparent masses Lungs: Normal respirations with diffuse wheezes, no rhonchi, no rales , few crackles   Heart: Irregular rate and rhythm, normal S1 S2, no murmur, no rub, no gallop, PMI is normal size and placement, carotid upstroke normal without bruit, jugular venous pressure normal Abdomen: Soft, non-tender, non-distended with normoactive bowel sounds. No hepatosplenomegaly. Abdominal aorta is normal size without bruit Extremities: No edema, no clubbing, no cyanosis, no ulcers,  Peripheral: 2+ radial, 2+ femoral, 2+ dorsal pedal pulses Neuro: Alert and oriented. Moves all extremities spontaneously. Psych:  Responds to questions appropriately with a normal  affect.   Intake/Output Summary (Last 24 hours) at 09/26/16 0611 Last data filed at 09/26/16 0356  Gross per 24 hour  Intake              770 ml  Output              200 ml  Net              570 ml    Inpatient Medications:  . acidophilus  1 capsule Oral Daily  . aspirin EC  81 mg Oral Daily  . azithromycin  500 mg Oral Daily  . cefTRIAXone (ROCEPHIN)  IV  2 g Intravenous Q24H  . cholecalciferol  5,000 Units Oral Daily  . diltiazem  300 mg Oral Daily  . ezetimibe  10 mg Oral Daily  . fluticasone  1 spray Each Nare Daily  . loratadine  10 mg Oral Daily  . magnesium oxide  400 mg Oral Daily  . metoprolol tartrate  12.5 mg Oral BID  . mometasone-formoterol  2 puff Inhalation BID  . montelukast  10 mg Oral QHS  . multivitamin with minerals  2 tablet Oral Daily  . multivitamin-lutein   Oral Daily  . omega-3 acid ethyl esters  1 capsule Oral Daily  . predniSONE  50 mg Oral Q breakfast  . sodium chloride flush  3 mL Intravenous Q12H  . vitamin B-12  3,000 mcg Oral Daily  . warfarin  4 mg Oral Daily  . Warfarin - Pharmacist Dosing Inpatient   Does not apply q1800   Infusions:   Labs:  Recent Labs  09/23/16 1542 09/24/16 0532  NA 139 138  K 4.1 3.7  CL 102 108  CO2 28 25  GLUCOSE 137* 176*  BUN 18 18  CREATININE 1.04* 0.73  CALCIUM 9.5 8.6*  MG 2.0  --  Recent Labs  09/23/16 1542  AST 41  ALT 32  ALKPHOS 69  BILITOT 0.8  PROT 7.8  ALBUMIN 3.9    Recent Labs  09/23/16 1542 09/24/16 0532  WBC 13.0* 7.5  NEUTROABS 10.3*  --   HGB 16.2* 14.2  HCT 48.2* 43.0  MCV 92.7 93.5  PLT 206 173    Recent Labs  09/23/16 1542  TROPONINI <0.03   Invalid input(s): POCBNP No results for input(s): HGBA1C in the last 72 hours.   Weights: Filed Weights   09/24/16 0509 09/25/16 0354 09/26/16 0352  Weight: 129.8 kg (286 lb 1.6 oz) 125.8 kg (277 lb 6.4 oz) 125.1 kg (275 lb 12.8 oz)     Radiology/Studies:  Dg Chest 2 View  Result Date: 09/23/2016 CLINICAL  DATA:  Cough, shortness breath, and fever. EXAM: CHEST  2 VIEW COMPARISON:  03/29/2016 FINDINGS: Heart size and pulmonary vascularity are normal. Peribronchial thickening. Slight atelectasis at the left lung base laterally. No infiltrates or effusions. No acute bone abnormality. IMPRESSION: Bronchitic changes with minimal atelectasis at the left base laterally. Electronically Signed   By: Lorriane Shire M.D.   On: 09/23/2016 15:47     Assessment and Recommendation  76 y.o. female with known chronic nonvalvular atrial fibrillation sleep apnea essential hypertension with the atrial fibrillation with rapid ventricular rate due to recent pneumonia and hypoxia needing further treatment options 1. Continue diltiazem and metoprolol combination for heart rate control without change today 2. Adjustment of warfarin dose for goal INR between 2 and 3 with the current use of antibiotics to reduce risk of stroke 3. No change in supportive care with antibiotics and inhalers of pneumonia 4. No further cardiac diagnostics at this time 5. Ambulation and further adjustments of medications as necessary with possible cardiology follow-up next week for adjustments of medications after discharge 6. Call if further questions  Signed, Serafina Royals M.D. FACC

## 2016-09-26 NOTE — Progress Notes (Signed)
[facility] for Warfarin Indication: atrial fibrillation  Allergies  Allergen Reactions  . Amoxicillin Other (See Comments)    Lips swelling, tingling  . Benzocaine-Menthol     Throat swelling  . Biafine [Wound Dressings] Swelling  . Chloraseptic Sore Throat [Acetaminophen] Other (See Comments)    throat swelling  . Fosamax [Alendronate Sodium]   . Neosporin [Neomycin-Bacitracin Zn-Polymyx] Other (See Comments)    Skin redness, puffy  . Other     "chlorotrimitron"-- rxn: throat swelling  . Oxycodone Other (See Comments)    Dizziness, lips tingling  . Statins Other (See Comments)    Muscle weakness, weak  . Tape Other (See Comments)    Skin redness    Patient Measurements: Height: 5' 5.5" (166.4 cm) Weight: 275 lb 12.8 oz (125.1 kg) IBW/kg (Calculated) : 58.15 Heparin Dosing Weight:    Vital Signs: Temp: 97.5 F (36.4 C) (04/07 0352) Temp Source: Oral (04/07 0352) BP: 121/53 (04/07 0352) Pulse Rate: 54 (04/07 0352)  Labs:  Recent Labs  09/23/16 1542 09/24/16 0532 09/25/16 0343 09/26/16 0621  HGB 16.2* 14.2  --   --   HCT 48.2* 43.0  --   --   PLT 206 173  --   --   LABPROT 19.0* 19.4* 24.0* 26.6*  INR 1.58 1.62 2.11 2.40  CREATININE 1.04* 0.73  --   --   TROPONINI <0.03  --   --   --     Estimated Creatinine Clearance: 82.8 mL/min (by C-G formula based on SCr of 0.73 mg/dL).   Medical History: Past Medical History:  Diagnosis Date  . Arthritis    wrist and knees  . Asthma   . Cancer ([place])    skin cancer  . Carpal tunnel syndrome   . Chicken pox   . Degenerative arthritis of knee, bilateral   . Diverticulitis   . Diverticulosis   . Dysrhythmia    afib  . GERD (gastroesophageal reflux disease)   . Hernia, umbilical   . Hyperlipidemia   . Hypertension   . Numbness and tingling    arm to leg  . Osteoporosis   . Pneumonia   . Shortness of breath    only with astma attacks  . Sleep apnea      Medications:  Taking Warfarin 4mg  daily prior to admission  Assessment: Patient is a 76yo female with history of afib to be continued on Warfarin, pharmacy consulted for management.  4/4 INR 1.58  Warfarin 4mg   4/5 INR 1.62  Warfarin 4mg  4/6 INR 2.11; 4 mg 4/7 INR 2.4  Goal of Therapy:  INR 2-3   Plan:  INR is therapeutic today. Patient is currently receiving antibiotics which may increase INR, will need to watch for this. Will continue with home dose of Warfarin 4mg . Daily INR checks.  Larene Beach, PharmD  09/26/2016 8:49 AM

## 2016-09-28 LAB — CULTURE, BLOOD (ROUTINE X 2): CULTURE: NO GROWTH

## 2016-12-03 ENCOUNTER — Other Ambulatory Visit
Admission: RE | Admit: 2016-12-03 | Discharge: 2016-12-03 | Disposition: A | Discharge status: Home / Self Care | Payer: Medicare Other | Source: Ambulatory Visit | Attending: Unknown Physician Specialty | Admitting: Unknown Physician Specialty

## 2016-12-03 DIAGNOSIS — T8484XA Pain due to internal orthopedic prosthetic devices, implants and grafts, initial encounter: Secondary | ICD-10-CM | POA: Diagnosis present

## 2016-12-04 ENCOUNTER — Emergency Department: Payer: Medicare Other

## 2016-12-04 ENCOUNTER — Encounter: Payer: Self-pay | Admitting: Medical Oncology

## 2016-12-04 ENCOUNTER — Emergency Department
Admission: EM | Admit: 2016-12-04 | Discharge: 2016-12-04 | Disposition: A | Discharge status: Home / Self Care | Payer: Medicare Other | Attending: Emergency Medicine | Admitting: Emergency Medicine

## 2016-12-04 DIAGNOSIS — J45909 Unspecified asthma, uncomplicated: Secondary | ICD-10-CM | POA: Diagnosis not present

## 2016-12-04 DIAGNOSIS — S0990XA Unspecified injury of head, initial encounter: Secondary | ICD-10-CM | POA: Insufficient documentation

## 2016-12-04 DIAGNOSIS — Y999 Unspecified external cause status: Secondary | ICD-10-CM | POA: Diagnosis not present

## 2016-12-04 DIAGNOSIS — W19XXXA Unspecified fall, initial encounter: Secondary | ICD-10-CM

## 2016-12-04 DIAGNOSIS — I10 Essential (primary) hypertension: Secondary | ICD-10-CM | POA: Insufficient documentation

## 2016-12-04 DIAGNOSIS — Z85828 Personal history of other malignant neoplasm of skin: Secondary | ICD-10-CM | POA: Diagnosis not present

## 2016-12-04 DIAGNOSIS — W0110XA Fall on same level from slipping, tripping and stumbling with subsequent striking against unspecified object, initial encounter: Secondary | ICD-10-CM | POA: Diagnosis not present

## 2016-12-04 DIAGNOSIS — Z96651 Presence of right artificial knee joint: Secondary | ICD-10-CM | POA: Insufficient documentation

## 2016-12-04 DIAGNOSIS — Y929 Unspecified place or not applicable: Secondary | ICD-10-CM | POA: Diagnosis not present

## 2016-12-04 DIAGNOSIS — Y939 Activity, unspecified: Secondary | ICD-10-CM | POA: Insufficient documentation

## 2016-12-04 DIAGNOSIS — Z7901 Long term (current) use of anticoagulants: Secondary | ICD-10-CM | POA: Diagnosis not present

## 2016-12-04 DIAGNOSIS — M25561 Pain in right knee: Secondary | ICD-10-CM | POA: Diagnosis not present

## 2016-12-04 DIAGNOSIS — Z79899 Other long term (current) drug therapy: Secondary | ICD-10-CM | POA: Diagnosis not present

## 2016-12-04 DIAGNOSIS — Z7982 Long term (current) use of aspirin: Secondary | ICD-10-CM | POA: Diagnosis not present

## 2016-12-04 LAB — SYNOVIAL CELL COUNT + DIFF, W/ CRYSTALS
CRYSTALS FLUID: NONE SEEN
Eosinophils-Synovial: 3 % — ABNORMAL HIGH (ref 0–1)
Lymphocytes-Synovial Fld: 25 % — ABNORMAL HIGH (ref 0–20)
Monocyte-Macrophage-Synovial Fluid: 3 % — ABNORMAL LOW (ref 50–90)
NEUTROPHIL, SYNOVIAL: 69 % — AB (ref 0–25)
WBC, SYNOVIAL: 129 /mm3 (ref 0–200)

## 2016-12-04 NOTE — ED Notes (Signed)

## 2016-12-04 NOTE — ED Provider Notes (Signed)
[facility] Emergency Department Provider Note  ____________________________________________  Time seen: Approximately 7:48 PM  I have reviewed the triage vital signs and the nursing notes.   HISTORY  Chief Complaint Fall    HPI Laura Gonzalez is a 76 y.o. female that presents to the emergency department for evaluation after tripping on a rug today. Patient states that she hit the right side of her head but did not lose consciousness. She has been moving her extremities normally. She is not concerned that anything is broken and feels that she is just bruised up. She has chronic knee pain and had a knee replacement done 2 years ago. She had blood drained off of her right knee yesterday. She takes warfarin. She denies headache, neck pain, visual changes, shortness of breath, chest pain, nausea, vomiting, abdominal pain.   Past Medical History:  Diagnosis Date  . Arthritis    wrist and knees  . Asthma   . Cancer ([place])    skin cancer  . Carpal tunnel syndrome   . Chicken pox   . Degenerative arthritis of knee, bilateral   . Diverticulitis   . Diverticulosis   . Dysrhythmia    afib  . GERD (gastroesophageal reflux disease)   . Hernia, umbilical   . Hyperlipidemia   . Hypertension   . Numbness and tingling    arm to leg  . Osteoporosis   . Pneumonia   . Shortness of breath    only with astma attacks  . Sleep apnea     Patient Active Problem List   Diagnosis Date Noted  . Hypotension 03/24/2016  . Diverticulitis 12/11/2014  . Sepsis ([place]) 12/11/2014  . Persistent atrial fibrillation ([place]) 12/11/2014  . HTN (hypertension) 12/11/2014  . GERD (gastroesophageal reflux disease) 12/11/2014  . Asthma 12/11/2014    Past Surgical History:  Procedure Laterality Date  . ANTERIOR CERVICAL DECOMP/DISCECTOMY FUSION N/A 08/09/2012   Procedure: ANTERIOR CERVICAL DECOMPRESSION/DISCECTOMY FUSION 2 LEVELS;  Surgeon: [name], MD;  Location: [facility];   Service: Neurosurgery;  Laterality: N/A;  C3-4 C4-5 Anterior cervical decompression/diskectomy/fusion/[employer] Bone/Trestle plate  . BREAST BIOPSY Left 1984   EXCISIONAL - NEG  . BREAST BIOPSY Left 1987   EXCISIONAL - NEG  . BREAST SURGERY    . COLONOSCOPY    . COLONOSCOPY WITH PROPOFOL N/A 07/29/2015   Procedure: COLONOSCOPY WITH PROPOFOL;  Surgeon: [name], MD;  Location: [facility] ENDOSCOPY;  Service: Endoscopy;  Laterality: N/A;  . colonscopy  2000,2007,2012  . ESOPHAGOGASTRODUODENOSCOPY    . HERNIA REPAIR    . JOINT REPLACEMENT Right    Total Knee Replacement  . LIPOMA EXCISION Right 2010   back  . MASTECTOMY PARTIAL / LUMPECTOMY Left 1980s  . ROTATOR CUFF REPAIR Bilateral right- 2009, left 2011  . SKIN CANCER EXCISION  2009   back of neck and right cheek  . TONSILLECTOMY  1960  . TRACHEOSTOMY    . UMBILICAL HERNIA REPAIR  [id number]  . VARICOSE VEIN SURGERY Left 04/2009 rt 2011  . [place]  . WRIST SURGERY Right 1998   external fixator    Prior to Admission medications   Medication Sig Start Date End Date Taking? Authorizing Provider  acetaminophen (TYLENOL) 325 MG tablet Take 650 mg by mouth every 6 (six) hours as needed for mild pain, moderate pain or headache.    [provider]  acidophilus (RISAQUAD) CAPS capsule Take 1 capsule by mouth daily.  [provider]  albuterol (PROVENTIL HFA;VENTOLIN HFA) 108 (90 BASE) MCG/ACT inhaler Inhale 2 puffs into the lungs every 6 (six) hours as needed. For shortness of breath    [provider]  albuterol (PROVENTIL) (2.5 MG/3ML) 0.083% nebulizer solution Take 2.5 mg by nebulization every 6 (six) hours as needed for wheezing or shortness of breath.    [provider]  Aloe-Sodium Chloride (AYR SALINE NASAL GEL NA) Place 1 application into the nose as needed.    [provider]  Alum Hydroxide-Mag Carbonate ([employer] HEARTBURN RELIEF PO) Take 1 tablet by mouth as needed.     [provider]  aspirin EC 81 MG tablet Take 81 mg by mouth daily.    [provider]  azithromycin (ZITHROMAX) 500 MG tablet Take 1 tablet (500 mg total) by mouth daily. 09/26/16   Demetrios Loll, MD  bisacodyl (BISACODYL) 5 MG EC tablet Take 5 mg by mouth daily as needed for moderate constipation.    [provider]  Calcium Citrate (CITRACAL PO) Take 2 tablets by mouth daily.     [provider]  Cholecalciferol (VITAMIN D3) 5000 UNITS CAPS Take 1 capsule by mouth daily.    [provider]  dextromethorphan-guaiFENesin (MUCINEX DM) 30-600 MG 12hr tablet Take 1 tablet by mouth 2 (two) times daily as needed for cough. 09/26/16   Demetrios Loll, MD  diltiazem (CARDIZEM CD) 300 MG 24 hr capsule Take 300 mg by mouth daily.    [provider]  diltiazem (CARDIZEM) 60 MG tablet Take 60 mg by mouth every 6 (six) hours as needed. Takes if has palpitation after taking diltiazem 300mg  dose     [provider]  diphenhydrAMINE (BENADRYL) 25 MG tablet Take 25 mg by mouth every 6 (six) hours as needed.    [provider]  ezetimibe (ZETIA) 10 MG tablet Take 10 mg by mouth daily.     [provider]  fluticasone (FLONASE) 50 MCG/ACT nasal spray Place 1 spray into both nostrils daily.     [provider]  hydrochlorothiazide (HYDRODIURIL) 12.5 MG tablet Take 1 tablet (12.5 mg total) by mouth daily. 04/01/16   Gladstone Lighter, MD  loratadine (CLARITIN) 10 MG tablet Take 10 mg by mouth daily.    [provider]  magnesium oxide (MAG-OX) 400 MG tablet Take 400 mg by mouth daily.    [provider]  MEGARED OMEGA-3 KRILL OIL 500 MG CAPS Take 1 capsule by mouth daily.    [provider]  metoprolol tartrate (LOPRESSOR) 25 MG tablet Take 12.5 mg by mouth 2 (two) times daily.     [provider]  mometasone-formoterol (DULERA) 200-5 MCG/ACT AERO Inhale 2 puffs into the lungs 2 (two) times  daily. Patient not taking: Reported on 09/23/2016 04/01/16   Gladstone Lighter, MD  montelukast (SINGULAIR) 10 MG tablet Take 10 mg by mouth at bedtime.    [provider]  Multiple Vitamin (MULTIVITAMIN WITH MINERALS) TABS Take 2 tablets by mouth daily.     [provider]  Multiple Vitamins-Minerals (PRESERVISION AREDS PO) Take 1 tablet by mouth daily.    [provider]  potassium chloride SA (K-DUR,KLOR-CON) 20 MEQ tablet Take 1 tablet (20 mEq total) by mouth 2 (two) times daily. Patient taking differently: Take 20 mEq by mouth 3 (three) times daily.  04/01/16   Gladstone Lighter, MD  predniSONE (DELTASONE) 10 MG tablet 40 mg po daily for 2 days, 20 mg po daily for 2 days,  10 mg po daily for 3 days. 09/26/16   Demetrios Loll, MD  trolamine salicylate (ASPERCREME) 10 % cream Apply 1 application topically as needed for muscle pain.    [provider]  vitamin B-12 (CYANOCOBALAMIN) 1000 MCG tablet Take 3,000 mcg by mouth daily.    [provider]  warfarin (COUMADIN) 2 MG tablet Take 1 tablet (2 mg total) by mouth daily. Patient taking differently: Take 4 mg by mouth daily.  12/20/14   Fritzi Mandes, MD    Allergies Amoxicillin; Benzocaine-menthol; Biafine [wound dressings]; Chloraseptic sore throat [acetaminophen]; Fosamax [alendronate sodium]; Neosporin [neomycin-bacitracin zn-polymyx]; Other; Oxycodone; Statins; and Tape  Family History  Problem Relation Age of Onset  . Pulmonary embolism Mother   . Arthritis Mother   . Hypertension Mother   . Heart attack Mother   . Breast cancer Mother 50  . Stomach cancer Maternal Aunt   . Throat cancer Maternal Uncle   . Stomach cancer Maternal Grandfather     Social History Social History  Substance Use Topics  . Smoking status: Never Smoker  . Smokeless tobacco: Never Used  . Alcohol use No     Review of Systems  Constitutional: No fever/chills Cardiovascular: No chest pain. Respiratory: No  SOB. Gastrointestinal: No abdominal pain.  No nausea, no vomiting.  Skin: Negative for rash, abrasions, lacerations. Positive for ecchymosis. Neurological: Negative for headaches, numbness or tingling   ____________________________________________   PHYSICAL EXAM:  VITAL SIGNS: ED Triage Vitals  Enc Vitals Group     BP 12/04/16 1458 (!) 110/55     Pulse Rate 12/04/16 1458 77     Resp 12/04/16 1458 18     Temp 12/04/16 1458 97.7 F (36.5 C)     Temp Source 12/04/16 1458 Oral     SpO2 12/04/16 1458 98 %     Weight 12/04/16 1456 275 lb (124.7 kg)     Height --      Head Circumference --      Peak Flow --      Pain Score 12/04/16 1455 5     Pain Loc --      Pain Edu? --      Excl. in [place]? --      Constitutional: Alert and oriented. Well appearing and in no acute distress. Eyes: Conjunctivae are normal. PERRL. EOMI. Head: Atraumatic. ENT:      Ears:      Nose: No congestion/rhinnorhea.      Mouth/Throat: Mucous membranes are moist.  Neck: No stridor. Cardiovascular: Normal rate, regular rhythm.  Good peripheral circulation. Respiratory: Normal respiratory effort without tachypnea or retractions. Lungs CTAB. Good air entry to the bases with no decreased or absent breath sounds. Gastrointestinal: Bowel sounds 4 quadrants. Soft and nontender to palpation. No guarding or rigidity. No palpable masses. No distention.  Musculoskeletal: Full range of motion to all extremities. No gross deformities appreciated. Mild bruising and swelling over right knee with iodine in place from procedure yesterday. Neurologic:  Normal speech and language. No gross focal neurologic deficits are appreciated.  Skin:  Skin is warm, dry and intact. 1 inch bruise to lower right abdomen.   ____________________________________________   LABS (all labs ordered are listed, but only abnormal results are displayed)  Labs Reviewed - No data to  display ____________________________________________  EKG   ____________________________________________  RADIOLOGY Robinette Haines, personally viewed and evaluated these images (plain radiographs) as part of my medical decision making, as well as reviewing the written report by the  radiologist.  Ct Head Wo Contrast  Result Date: 12/04/2016 CLINICAL DATA:  Tripped and fell over a rug. Head injury. On warfarin. Initial encounter. EXAM: CT HEAD WITHOUT CONTRAST TECHNIQUE: Contiguous axial images were obtained from the base of the skull through the vertex without intravenous contrast. COMPARISON:  07/09/2009 FINDINGS: Brain: There is no evidence of acute infarct, intracranial hemorrhage, mass, midline shift, or extra-axial fluid collection. The ventricles and sulci are normal. Scattered cerebral white matter hypodensities are nonspecific but likely reflect minimal chronic small vessel ischemic disease. Vascular: Calcified atherosclerosis at the skullbase. No hyperdense vessel. Skull: Hyperostosis frontalis. No fracture or suspicious osseous lesion. Sinuses/Orbits: Visualized paranasal sinuses and mastoid air cells are clear. Orbits are unremarkable. Other: None. IMPRESSION: No evidence of acute intracranial abnormality. Electronically Signed   By: Logan Bores M.D.   On: 12/04/2016 15:23   Dg Knee Complete 4 Views Right  Result Date: 12/04/2016 CLINICAL DATA:  Patient reports she tripped and fell over a rug today onto her right side. Reports diffuse pain to right knee. Hx right knee replacement. EXAM: RIGHT KNEE - COMPLETE 4+ VIEW COMPARISON:  06/13/2014 FINDINGS: Status post knee arthroplasty. No evidence for acute fracture or subluxation. Hardware is intact. No evidence for loosening or infection. No joint effusion. IMPRESSION: Status post knee arthroplasty.  No evidence for acute  abnormality. Electronically Signed   By: Nolon Nations M.D.   On: 12/04/2016 18:59     ____________________________________________    PROCEDURES  Procedure(s) performed:    Procedures    Medications - No data to display   ____________________________________________   INITIAL IMPRESSION / ASSESSMENT AND PLAN / ED COURSE  Pertinent labs & imaging results that were available during my care of the patient were reviewed by me and considered in my medical decision making (see chart for details).  Review of the [place] CSRS was performed in accordance of the [place] prior to dispensing any controlled drugs.   Patient presented to the emergency department for evaluation after fall today. Vital signs and exam are reassuring. Head CT negative for acute processes. Knee x-ray negative for acute bony abnormalities. Patient states that she feels well, and she is up ambulating in the room. Patient is to follow up with PCP as directed. Patient is given ED precautions to return to the ED for any worsening or new symptoms.     ____________________________________________  FINAL CLINICAL IMPRESSION(S) / ED DIAGNOSES  Final diagnoses:  Fall, initial encounter      NEW MEDICATIONS STARTED DURING THIS VISIT:  Discharge Medication List as of 12/04/2016  7:31 PM          This chart was dictated using voice recognition software/Dragon. Despite best efforts to proofread, errors can occur which can change the meaning. Any change was purely unintentional.    Laban Emperor, PA-C 12/04/16 2112    Rudene Re, MD 12/04/16 [phone]

## 2016-12-04 NOTE — ED Triage Notes (Signed)
Pt reports she tripped and fell over a rug. Pt fell on rt side and c/o pain to arm and knee. Pt reports hitting head but denies LOC. Pt takes warfarin at home. Pt A/O x 4.

## 2016-12-07 LAB — BODY FLUID CULTURE: CULTURE: NO GROWTH

## 2017-03-04 ENCOUNTER — Other Ambulatory Visit: Payer: Self-pay | Admitting: Orthopedic Surgery

## 2017-03-04 DIAGNOSIS — Z96651 Presence of right artificial knee joint: Secondary | ICD-10-CM

## 2017-03-11 ENCOUNTER — Encounter
Admission: RE | Admit: 2017-03-11 | Discharge: 2017-03-11 | Disposition: A | Discharge status: Home / Self Care | Payer: Medicare Other | Source: Ambulatory Visit | Attending: Orthopedic Surgery | Admitting: Orthopedic Surgery

## 2017-03-11 DIAGNOSIS — Z96651 Presence of right artificial knee joint: Secondary | ICD-10-CM | POA: Insufficient documentation

## 2017-03-11 MED ORDER — TECHNETIUM TC 99M MEDRONATE IV KIT
23.6400 | PACK | Freq: Once | INTRAVENOUS | Status: AC | PRN
  Administered 2017-03-11: 23.64 via INTRAVENOUS

## 2017-04-20 ENCOUNTER — Encounter: Admission: RE | Payer: Self-pay | Source: Ambulatory Visit

## 2017-04-20 ENCOUNTER — Ambulatory Visit: Admission: RE | Admit: 2017-04-20 | Payer: Medicare Other | Source: Ambulatory Visit | Admitting: Ophthalmology

## 2017-04-20 SURGERY — PHACOEMULSIFICATION, CATARACT, WITH IOL INSERTION
Anesthesia: Choice | Laterality: Left

## 2017-04-28 ENCOUNTER — Other Ambulatory Visit
Admission: RE | Admit: 2017-04-28 | Discharge: 2017-04-28 | Disposition: A | Discharge status: Home / Self Care | Payer: Medicare Other | Source: Ambulatory Visit | Attending: Orthopedic Surgery | Admitting: Orthopedic Surgery

## 2017-04-28 DIAGNOSIS — M25561 Pain in right knee: Secondary | ICD-10-CM | POA: Insufficient documentation

## 2017-04-28 LAB — SYNOVIAL CELL COUNT + DIFF, W/ CRYSTALS
Crystals, Fluid: NONE SEEN
EOSINOPHILS-SYNOVIAL: 1 %
Lymphocytes-Synovial Fld: 14 %
Monocyte-Macrophage-Synovial Fluid: 4 %
NEUTROPHIL, SYNOVIAL: 81 %
WBC, Synovial: 24157 /mm3 — ABNORMAL HIGH (ref 0–200)

## 2017-05-02 LAB — BODY FLUID CULTURE: CULTURE: NO GROWTH

## 2017-05-24 ENCOUNTER — Other Ambulatory Visit
Admission: RE | Admit: 2017-05-24 | Discharge: 2017-05-24 | Disposition: A | Discharge status: Home / Self Care | Payer: Medicare Other | Source: Ambulatory Visit | Attending: Orthopedic Surgery | Admitting: Orthopedic Surgery

## 2017-05-24 DIAGNOSIS — Z96651 Presence of right artificial knee joint: Secondary | ICD-10-CM | POA: Diagnosis present

## 2017-05-24 LAB — BODY FLUID CELL COUNT WITH DIFFERENTIAL
Eos, Fluid: 0 %
LYMPHS FL: 23 %
MONOCYTE-MACROPHAGE-SEROUS FLUID: 0 %
Neutrophil Count, Fluid: 77 %
Other Cells, Fluid: 0 %
Total Nucleated Cell Count, Fluid: 11265 cu mm

## 2017-05-28 LAB — BODY FLUID CULTURE: CULTURE: NO GROWTH

## 2017-07-22 ENCOUNTER — Inpatient Hospital Stay
Admission: EM | Admit: 2017-07-22 | Discharge: 2017-07-28 | DRG: 560 | Disposition: A | Discharge status: Skilled Nursing Facility | Payer: Medicare Other | Attending: Internal Medicine | Admitting: Internal Medicine

## 2017-07-22 ENCOUNTER — Other Ambulatory Visit: Payer: Self-pay

## 2017-07-22 DIAGNOSIS — M810 Age-related osteoporosis without current pathological fracture: Secondary | ICD-10-CM | POA: Diagnosis present

## 2017-07-22 DIAGNOSIS — G8929 Other chronic pain: Secondary | ICD-10-CM

## 2017-07-22 DIAGNOSIS — M25561 Pain in right knee: Secondary | ICD-10-CM | POA: Diagnosis present

## 2017-07-22 DIAGNOSIS — Z981 Arthrodesis status: Secondary | ICD-10-CM

## 2017-07-22 DIAGNOSIS — I482 Chronic atrial fibrillation: Secondary | ICD-10-CM | POA: Diagnosis present

## 2017-07-22 DIAGNOSIS — Z85828 Personal history of other malignant neoplasm of skin: Secondary | ICD-10-CM

## 2017-07-22 DIAGNOSIS — R2681 Unsteadiness on feet: Secondary | ICD-10-CM | POA: Diagnosis present

## 2017-07-22 DIAGNOSIS — I10 Essential (primary) hypertension: Secondary | ICD-10-CM | POA: Diagnosis present

## 2017-07-22 DIAGNOSIS — I480 Paroxysmal atrial fibrillation: Secondary | ICD-10-CM | POA: Diagnosis present

## 2017-07-22 DIAGNOSIS — Z7901 Long term (current) use of anticoagulants: Secondary | ICD-10-CM

## 2017-07-22 DIAGNOSIS — Z6841 Body Mass Index (BMI) 40.0 and over, adult: Secondary | ICD-10-CM

## 2017-07-22 DIAGNOSIS — Z79899 Other long term (current) drug therapy: Secondary | ICD-10-CM

## 2017-07-22 DIAGNOSIS — J449 Chronic obstructive pulmonary disease, unspecified: Secondary | ICD-10-CM | POA: Diagnosis present

## 2017-07-22 DIAGNOSIS — Z7951 Long term (current) use of inhaled steroids: Secondary | ICD-10-CM

## 2017-07-22 DIAGNOSIS — Z91048 Other nonmedicinal substance allergy status: Secondary | ICD-10-CM

## 2017-07-22 DIAGNOSIS — B9620 Unspecified Escherichia coli [E. coli] as the cause of diseases classified elsewhere: Secondary | ICD-10-CM | POA: Diagnosis present

## 2017-07-22 DIAGNOSIS — E785 Hyperlipidemia, unspecified: Secondary | ICD-10-CM | POA: Diagnosis present

## 2017-07-22 DIAGNOSIS — Z7982 Long term (current) use of aspirin: Secondary | ICD-10-CM

## 2017-07-22 DIAGNOSIS — T8484XA Pain due to internal orthopedic prosthetic devices, implants and grafts, initial encounter: Secondary | ICD-10-CM | POA: Diagnosis not present

## 2017-07-22 DIAGNOSIS — N390 Urinary tract infection, site not specified: Secondary | ICD-10-CM | POA: Diagnosis present

## 2017-07-22 DIAGNOSIS — Z885 Allergy status to narcotic agent status: Secondary | ICD-10-CM

## 2017-07-22 DIAGNOSIS — Z1623 Resistance to quinolones and fluoroquinolones: Secondary | ICD-10-CM | POA: Diagnosis present

## 2017-07-22 DIAGNOSIS — Z880 Allergy status to penicillin: Secondary | ICD-10-CM

## 2017-07-22 DIAGNOSIS — G4730 Sleep apnea, unspecified: Secondary | ICD-10-CM | POA: Diagnosis present

## 2017-07-22 DIAGNOSIS — K219 Gastro-esophageal reflux disease without esophagitis: Secondary | ICD-10-CM | POA: Diagnosis present

## 2017-07-22 DIAGNOSIS — Z888 Allergy status to other drugs, medicaments and biological substances status: Secondary | ICD-10-CM

## 2017-07-22 NOTE — ED Triage Notes (Signed)
Pt arrives to ED via [facility] from home with c/o RIGHT knee pain. Pt reports total right knee replacement in Dec of 2015. Pt denies recent injury or trauma; no obvious deformity or dislocation; CMS intact. Pt is A&O, in NAD; RR even, regular, and unlabored. Dr Owens Shark at bedside upon pt's arrival to ED.

## 2017-07-23 ENCOUNTER — Encounter
Admission: RE | Admit: 2017-07-23 | Discharge: 2017-07-23 | Disposition: A | Discharge status: Home / Self Care | Payer: Medicare Other | Source: Ambulatory Visit | Attending: Internal Medicine | Admitting: Internal Medicine

## 2017-07-23 ENCOUNTER — Other Ambulatory Visit: Payer: Self-pay

## 2017-07-23 ENCOUNTER — Encounter: Payer: Self-pay | Admitting: Internal Medicine

## 2017-07-23 ENCOUNTER — Emergency Department: Payer: Medicare Other

## 2017-07-23 DIAGNOSIS — R2681 Unsteadiness on feet: Secondary | ICD-10-CM | POA: Diagnosis present

## 2017-07-23 LAB — CBC
HCT: 44.6 % (ref 35.0–47.0)
HEMOGLOBIN: 14.6 g/dL (ref 12.0–16.0)
MCH: 30.1 pg (ref 26.0–34.0)
MCHC: 32.8 g/dL (ref 32.0–36.0)
MCV: 91.9 fL (ref 80.0–100.0)
Platelets: 211 10*3/uL (ref 150–440)
RBC: 4.85 MIL/uL (ref 3.80–5.20)
RDW: 13.9 % (ref 11.5–14.5)
WBC: 9.3 10*3/uL (ref 3.6–11.0)

## 2017-07-23 LAB — COMPREHENSIVE METABOLIC PANEL
ALT: 27 U/L (ref 14–54)
ANION GAP: 11 (ref 5–15)
AST: 35 U/L (ref 15–41)
Albumin: 4 g/dL (ref 3.5–5.0)
Alkaline Phosphatase: 78 U/L (ref 38–126)
BILIRUBIN TOTAL: 0.4 mg/dL (ref 0.3–1.2)
BUN: 20 mg/dL (ref 6–20)
CO2: 26 mmol/L (ref 22–32)
Calcium: 10.2 mg/dL (ref 8.9–10.3)
Chloride: 104 mmol/L (ref 101–111)
Creatinine, Ser: 0.75 mg/dL (ref 0.44–1.00)
Glucose, Bld: 122 mg/dL — ABNORMAL HIGH (ref 65–99)
POTASSIUM: 4.1 mmol/L (ref 3.5–5.1)
Sodium: 141 mmol/L (ref 135–145)
TOTAL PROTEIN: 7.3 g/dL (ref 6.5–8.1)

## 2017-07-23 LAB — BASIC METABOLIC PANEL
Anion gap: 10 (ref 5–15)
BUN: 17 mg/dL (ref 6–20)
CALCIUM: 9.4 mg/dL (ref 8.9–10.3)
CHLORIDE: 104 mmol/L (ref 101–111)
CO2: 27 mmol/L (ref 22–32)
CREATININE: 0.77 mg/dL (ref 0.44–1.00)
GFR calc Af Amer: >60 mL/min (ref >60)
Glucose, Bld: 104 mg/dL — ABNORMAL HIGH (ref 65–99)
Potassium: 4.3 mmol/L (ref 3.5–5.1)
SODIUM: 141 mmol/L (ref 135–145)

## 2017-07-23 LAB — PROTIME-INR
INR: 2.52
PROTHROMBIN TIME: 27 s — AB (ref 11.4–15.2)

## 2017-07-23 LAB — URINALYSIS, COMPLETE (UACMP) WITH MICROSCOPIC
Bilirubin Urine: NEGATIVE
GLUCOSE, UA: NEGATIVE mg/dL
HGB URINE DIPSTICK: NEGATIVE
Ketones, ur: NEGATIVE mg/dL
NITRITE: NEGATIVE
PROTEIN: NEGATIVE mg/dL
Specific Gravity, Urine: 1.018 (ref 1.005–1.030)
pH: 5 (ref 5.0–8.0)

## 2017-07-23 LAB — APTT: aPTT: 47 seconds — ABNORMAL HIGH (ref 24–36)

## 2017-07-23 MED ORDER — EZETIMIBE 10 MG PO TABS
10.0000 mg | ORAL_TABLET | Freq: Every day | ORAL | Status: DC
  Administered 2017-07-23 – 2017-07-28 (×6): 10 mg via ORAL
  Filled 2017-07-23 (×6): qty 1

## 2017-07-23 MED ORDER — WARFARIN SODIUM 4 MG PO TABS
4.0000 mg | ORAL_TABLET | Freq: Every day | ORAL | Status: DC
  Administered 2017-07-23 – 2017-07-26 (×4): 4 mg via ORAL
  Filled 2017-07-23 (×4): qty 1

## 2017-07-23 MED ORDER — TRAMADOL HCL 50 MG PO TABS
50.0000 mg | ORAL_TABLET | Freq: Four times a day (QID) | ORAL | Status: DC | PRN
  Administered 2017-07-27 – 2017-07-28 (×2): 50 mg via ORAL
  Filled 2017-07-23 (×2): qty 1

## 2017-07-23 MED ORDER — DILTIAZEM HCL ER COATED BEADS 180 MG PO CP24
180.0000 mg | ORAL_CAPSULE | Freq: Every day | ORAL | Status: DC
  Administered 2017-07-24 – 2017-07-28 (×5): 180 mg via ORAL
  Filled 2017-07-23 (×5): qty 1

## 2017-07-23 MED ORDER — ASPIRIN EC 81 MG PO TBEC
81.0000 mg | DELAYED_RELEASE_TABLET | Freq: Every day | ORAL | Status: DC
  Administered 2017-07-23 – 2017-07-28 (×6): 81 mg via ORAL
  Filled 2017-07-23 (×6): qty 1

## 2017-07-23 MED ORDER — SENNOSIDES-DOCUSATE SODIUM 8.6-50 MG PO TABS: 1.0000 | ORAL_TABLET | Freq: Every evening | ORAL | Status: DC | PRN

## 2017-07-23 MED ORDER — VITAMIN D 1000 UNITS PO TABS
5000.0000 [IU] | ORAL_TABLET | Freq: Every day | ORAL | Status: DC
  Administered 2017-07-23 – 2017-07-28 (×6): 5000 [IU] via ORAL
  Filled 2017-07-23 (×5): qty 5

## 2017-07-23 MED ORDER — MONTELUKAST SODIUM 10 MG PO TABS
10.0000 mg | ORAL_TABLET | Freq: Every day | ORAL | Status: DC
  Administered 2017-07-23 – 2017-07-27 (×5): 10 mg via ORAL
  Filled 2017-07-23 (×5): qty 1

## 2017-07-23 MED ORDER — FOSFOMYCIN TROMETHAMINE 3 G PO PACK
3.0000 g | PACK | Freq: Once | ORAL | Status: AC
  Administered 2017-07-23: 3 g via ORAL
  Filled 2017-07-23: qty 3

## 2017-07-23 MED ORDER — SODIUM CHLORIDE 0.9% FLUSH
3.0000 mL | Freq: Two times a day (BID) | INTRAVENOUS | Status: DC
  Administered 2017-07-23 – 2017-07-27 (×10): 3 mL via INTRAVENOUS

## 2017-07-23 MED ORDER — DILTIAZEM HCL ER COATED BEADS 180 MG PO CP24
300.0000 mg | ORAL_CAPSULE | Freq: Every day | ORAL | Status: DC
  Filled 2017-07-23 (×2): qty 1

## 2017-07-23 MED ORDER — MORPHINE SULFATE (PF) 2 MG/ML IV SOLN
2.0000 mg | Freq: Once | INTRAVENOUS | Status: AC
  Administered 2017-07-23: 2 mg via INTRAVENOUS
  Filled 2017-07-23: qty 1

## 2017-07-23 MED ORDER — ALBUTEROL SULFATE (2.5 MG/3ML) 0.083% IN NEBU: 2.5000 mg | INHALATION_SOLUTION | Freq: Four times a day (QID) | RESPIRATORY_TRACT | Status: DC | PRN

## 2017-07-23 MED ORDER — ONDANSETRON HCL 4 MG PO TABS: 4.0000 mg | ORAL_TABLET | Freq: Four times a day (QID) | ORAL | Status: DC | PRN

## 2017-07-23 MED ORDER — SODIUM CHLORIDE 0.9% FLUSH: 3.0000 mL | INTRAVENOUS | Status: DC | PRN

## 2017-07-23 MED ORDER — WARFARIN - PHYSICIAN DOSING INPATIENT: Freq: Every day | Status: DC

## 2017-07-23 MED ORDER — DILTIAZEM HCL ER COATED BEADS 180 MG PO CP24
300.0000 mg | ORAL_CAPSULE | Freq: Every day | ORAL | Status: DC
  Filled 2017-07-23: qty 1

## 2017-07-23 MED ORDER — RISAQUAD PO CAPS
1.0000 | ORAL_CAPSULE | Freq: Every day | ORAL | Status: DC
  Administered 2017-07-23 – 2017-07-28 (×6): 1 via ORAL
  Filled 2017-07-23 (×6): qty 1

## 2017-07-23 MED ORDER — DM-GUAIFENESIN ER 30-600 MG PO TB12: 1.0000 | ORAL_TABLET | Freq: Two times a day (BID) | ORAL | Status: DC | PRN

## 2017-07-23 MED ORDER — OXYCODONE-ACETAMINOPHEN 5-325 MG PO TABS: 1.0000 | ORAL_TABLET | ORAL | 0 refills | Status: DC | PRN

## 2017-07-23 MED ORDER — SODIUM CHLORIDE 0.9 % IV SOLN: 250.0000 mL | INTRAVENOUS | Status: DC | PRN

## 2017-07-23 MED ORDER — DILTIAZEM HCL ER COATED BEADS 300 MG PO CP24
300.0000 mg | ORAL_CAPSULE | Freq: Every day | ORAL | Status: DC
  Filled 2017-07-23: qty 1

## 2017-07-23 MED ORDER — IBUPROFEN 400 MG PO TABS
400.0000 mg | ORAL_TABLET | Freq: Three times a day (TID) | ORAL | Status: AC
  Administered 2017-07-23 – 2017-07-24 (×5): 400 mg via ORAL
  Filled 2017-07-23 (×3): qty 1

## 2017-07-23 MED ORDER — VITAMIN B-12 1000 MCG PO TABS
3000.0000 ug | ORAL_TABLET | Freq: Every day | ORAL | Status: DC
  Administered 2017-07-23 – 2017-07-28 (×6): 3000 ug via ORAL
  Filled 2017-07-23 (×7): qty 3

## 2017-07-23 MED ORDER — DILTIAZEM HCL ER COATED BEADS 120 MG PO CP24
120.0000 mg | ORAL_CAPSULE | Freq: Every day | ORAL | Status: DC
Start: 2017-07-24 — End: 2017-07-28
  Administered 2017-07-24 – 2017-07-28 (×5): 120 mg via ORAL
  Filled 2017-07-23 (×5): qty 1

## 2017-07-23 MED ORDER — ALBUTEROL SULFATE (2.5 MG/3ML) 0.083% IN NEBU: 2.5000 mg | INHALATION_SOLUTION | RESPIRATORY_TRACT | Status: DC | PRN

## 2017-07-23 MED ORDER — LORATADINE 10 MG PO TABS
10.0000 mg | ORAL_TABLET | Freq: Every day | ORAL | Status: DC
  Administered 2017-07-23 – 2017-07-28 (×6): 10 mg via ORAL
  Filled 2017-07-23 (×6): qty 1

## 2017-07-23 MED ORDER — DEXTROMETHORPHAN POLISTIREX ER 30 MG/5ML PO SUER
30.0000 mg | Freq: Two times a day (BID) | ORAL | Status: DC | PRN
  Filled 2017-07-23: qty 5

## 2017-07-23 MED ORDER — DILTIAZEM HCL ER COATED BEADS 180 MG PO CP24
300.0000 mg | ORAL_CAPSULE | Freq: Every day | ORAL | Status: DC
  Administered 2017-07-23: 300 mg via ORAL
  Filled 2017-07-23 (×2): qty 1

## 2017-07-23 MED ORDER — MAGNESIUM OXIDE 400 (241.3 MG) MG PO TABS
400.0000 mg | ORAL_TABLET | Freq: Every day | ORAL | Status: DC
  Administered 2017-07-23 – 2017-07-28 (×6): 400 mg via ORAL
  Filled 2017-07-23 (×6): qty 1

## 2017-07-23 MED ORDER — ADULT MULTIVITAMIN W/MINERALS CH
2.0000 | ORAL_TABLET | Freq: Every day | ORAL | Status: DC
  Administered 2017-07-23 – 2017-07-28 (×6): 2 via ORAL
  Filled 2017-07-23 (×6): qty 2

## 2017-07-23 MED ORDER — HYDROCHLOROTHIAZIDE 25 MG PO TABS
12.5000 mg | ORAL_TABLET | Freq: Every day | ORAL | Status: DC
  Administered 2017-07-23 – 2017-07-28 (×5): 12.5 mg via ORAL
  Filled 2017-07-23 (×5): qty 1

## 2017-07-23 MED ORDER — WARFARIN - PHARMACIST DOSING INPATIENT
Freq: Every day | Status: DC
  Administered 2017-07-23 – 2017-07-24 (×2)
  Administered 2017-07-27: 1

## 2017-07-23 MED ORDER — BISACODYL 5 MG PO TBEC: 5.0000 mg | DELAYED_RELEASE_TABLET | Freq: Every day | ORAL | Status: DC | PRN

## 2017-07-23 MED ORDER — METOPROLOL TARTRATE 25 MG PO TABS
12.5000 mg | ORAL_TABLET | Freq: Two times a day (BID) | ORAL | Status: DC
  Administered 2017-07-23 – 2017-07-28 (×11): 12.5 mg via ORAL
  Filled 2017-07-23 (×11): qty 1

## 2017-07-23 MED ORDER — FLUTICASONE PROPIONATE 50 MCG/ACT NA SUSP
1.0000 | Freq: Every day | NASAL | Status: DC
  Administered 2017-07-23 – 2017-07-27 (×3): 1 via NASAL
  Filled 2017-07-23: qty 16

## 2017-07-23 MED ORDER — GUAIFENESIN ER 600 MG PO TB12
600.0000 mg | ORAL_TABLET | Freq: Two times a day (BID) | ORAL | Status: DC | PRN
  Administered 2017-07-24: 600 mg via ORAL
  Filled 2017-07-23: qty 1

## 2017-07-23 MED ORDER — ONDANSETRON HCL 4 MG/2ML IJ SOLN: 4.0000 mg | Freq: Four times a day (QID) | INTRAMUSCULAR | Status: DC | PRN

## 2017-07-23 NOTE — ED Provider Notes (Signed)
[facility] Emergency Department Provider Note   First MD Initiated Contact with Patient 07/22/17 2352     (approximate)  I have reviewed the triage vital signs and the nursing notes.   HISTORY  Chief Complaint Knee Pain    HPI Laura Gonzalez is a 77 y.o. female below list of chronic medical conditions including degenerative arthritis of bilateral knees presents to the emergency department with 8 out of 10 anterior right knee pain chronically since 2015 status post knee replacement.  Patient states over the last 24 hours pain is markedly improved increased.  Patient states that she takes tramadol at home for pain however pain has been unrelieved with tramadol today.  Patient states pain is worse with any ambulation.  Patient walks with a walker at home.   Past Medical History:  Diagnosis Date  . Arthritis    wrist and knees  . Asthma   . Cancer ([place])    skin cancer  . Carpal tunnel syndrome   . Chicken pox   . Degenerative arthritis of knee, bilateral   . Diverticulitis   . Diverticulosis   . Dysrhythmia    afib  . GERD (gastroesophageal reflux disease)   . Hernia, umbilical   . Hyperlipidemia   . Hypertension   . Numbness and tingling    arm to leg  . Osteoporosis   . Pneumonia   . Shortness of breath    only with astma attacks  . Sleep apnea     Patient Active Problem List   Diagnosis Date Noted  . Hypotension 03/24/2016  . Diverticulitis 12/11/2014  . Sepsis ([place]) 12/11/2014  . Persistent atrial fibrillation ([place]) 12/11/2014  . HTN (hypertension) 12/11/2014  . GERD (gastroesophageal reflux disease) 12/11/2014  . Asthma 12/11/2014    Past Surgical History:  Procedure Laterality Date  . ANTERIOR CERVICAL DECOMP/DISCECTOMY FUSION N/A 08/09/2012   Procedure: ANTERIOR CERVICAL DECOMPRESSION/DISCECTOMY FUSION 2 LEVELS;  Surgeon: [name], MD;  Location: [place] [facility];  Service: Neurosurgery;  Laterality: N/A;  C3-4 C4-5 Anterior  cervical decompression/diskectomy/fusion/LifeNet Bone/Trestle plate  . BREAST BIOPSY Left 1984   EXCISIONAL - NEG  . BREAST BIOPSY Left 1987   EXCISIONAL - NEG  . BREAST SURGERY    . COLONOSCOPY    . COLONOSCOPY WITH PROPOFOL N/A 07/29/2015   Procedure: COLONOSCOPY WITH PROPOFOL;  Surgeon: [name], MD;  Location: [facility] ENDOSCOPY;  Service: Endoscopy;  Laterality: N/A;  . colonscopy  2000,2007,2012  . ESOPHAGOGASTRODUODENOSCOPY    . HERNIA REPAIR    . JOINT REPLACEMENT Right    Total Knee Replacement  . LIPOMA EXCISION Right 2010   back  . MASTECTOMY PARTIAL / LUMPECTOMY Left 1980s  . ROTATOR CUFF REPAIR Bilateral right- 2009, left 2011  . SKIN CANCER EXCISION  2009   back of neck and right cheek  . TONSILLECTOMY  1960  . TRACHEOSTOMY    . UMBILICAL HERNIA REPAIR  [id number]  . VARICOSE VEIN SURGERY Left 04/2009 rt 2011  . [place]  . WRIST SURGERY Right 1998   external fixator    Prior to Admission medications   Medication Sig Start Date End Date Taking? Authorizing Provider  acetaminophen (TYLENOL) 325 MG tablet Take 650 mg by mouth every 6 (six) hours as needed for mild pain, moderate pain or headache.    [provider]  acidophilus (RISAQUAD) CAPS capsule Take 1 capsule by mouth daily.    [provider]  albuterol (  PROVENTIL HFA;VENTOLIN HFA) 108 (90 BASE) MCG/ACT inhaler Inhale 2 puffs into the lungs every 6 (six) hours as needed. For shortness of breath    [provider]  albuterol (PROVENTIL) (2.5 MG/3ML) 0.083% nebulizer solution Take 2.5 mg by nebulization every 6 (six) hours as needed for wheezing or shortness of breath.    [provider]  Aloe-Sodium Chloride (AYR SALINE NASAL GEL NA) Place 1 application into the nose as needed.    [provider]  Alum Hydroxide-Mag Carbonate ([employer] HEARTBURN RELIEF PO) Take 1 tablet by mouth as needed.    [provider]  aspirin EC 81 MG tablet Take 81  mg by mouth daily.    [provider]  azithromycin (ZITHROMAX) 500 MG tablet Take 1 tablet (500 mg total) by mouth daily. 09/26/16   Demetrios Loll, MD  bisacodyl (BISACODYL) 5 MG EC tablet Take 5 mg by mouth daily as needed for moderate constipation.    [provider]  Calcium Citrate (CITRACAL PO) Take 2 tablets by mouth daily.     [provider]  Cholecalciferol (VITAMIN D3) 5000 UNITS CAPS Take 1 capsule by mouth daily.    [provider]  dextromethorphan-guaiFENesin (MUCINEX DM) 30-600 MG 12hr tablet Take 1 tablet by mouth 2 (two) times daily as needed for cough. 09/26/16   Demetrios Loll, MD  diltiazem (CARDIZEM CD) 300 MG 24 hr capsule Take 300 mg by mouth daily.    [provider]  diltiazem (CARDIZEM) 60 MG tablet Take 60 mg by mouth every 6 (six) hours as needed. Takes if has palpitation after taking diltiazem 300mg  dose     [provider]  diphenhydrAMINE (BENADRYL) 25 MG tablet Take 25 mg by mouth every 6 (six) hours as needed.    [provider]  ezetimibe (ZETIA) 10 MG tablet Take 10 mg by mouth daily.     [provider]  fluticasone (FLONASE) 50 MCG/ACT nasal spray Place 1 spray into both nostrils daily.     [provider]  hydrochlorothiazide (HYDRODIURIL) 12.5 MG tablet Take 1 tablet (12.5 mg total) by mouth daily. 04/01/16   Gladstone Lighter, MD  loratadine (CLARITIN) 10 MG tablet Take 10 mg by mouth daily.    [provider]  magnesium oxide (MAG-OX) 400 MG tablet Take 400 mg by mouth daily.    [provider]  MEGARED OMEGA-3 KRILL OIL 500 MG CAPS Take 1 capsule by mouth daily.    [provider]  metoprolol tartrate (LOPRESSOR) 25 MG tablet Take 12.5 mg by mouth 2 (two) times daily.     [provider]  mometasone-formoterol (DULERA) 200-5 MCG/ACT AERO Inhale 2 puffs into the lungs 2 (two) times daily. Patient not taking: Reported on 09/23/2016 04/01/16   Gladstone Lighter, MD  montelukast (SINGULAIR) 10 MG tablet Take 10 mg by mouth at bedtime.    [provider]  Multiple Vitamin (MULTIVITAMIN WITH MINERALS) TABS Take 2 tablets by mouth daily.     [provider]  Multiple Vitamins-Minerals (PRESERVISION AREDS PO) Take 1 tablet by mouth daily.    [provider]  oxyCODONE-acetaminophen (PERCOCET) 5-325 MG tablet Take 1 tablet by mouth every 4 (four) hours as needed for severe pain. 07/23/17   Gregor Hams, MD  potassium chloride SA (K-DUR,KLOR-CON) 20 MEQ tablet Take 1 tablet (20 mEq total) by mouth 2 (two) times daily. Patient taking differently: Take 20 mEq by mouth 3 (three) times daily.  04/01/16   Kalisetti,  Hart [place], MD  predniSONE (DELTASONE) 10 MG tablet 40 mg po daily for 2 days, 20 mg po daily for 2 days, 10 mg po daily for 3 days. 09/26/16   Demetrios Loll, MD  trolamine salicylate (ASPERCREME) 10 % cream Apply 1 application topically as needed for muscle pain.    [provider]  vitamin B-12 (CYANOCOBALAMIN) 1000 MCG tablet Take 3,000 mcg by mouth daily.    [provider]  warfarin (COUMADIN) 2 MG tablet Take 1 tablet (2 mg total) by mouth daily. Patient taking differently: Take 4 mg by mouth daily.  12/20/14   Fritzi Mandes, MD    Allergies Amoxicillin; Benzocaine-menthol; Biafine [wound dressings]; Chloraseptic sore throat [acetaminophen]; Fosamax [alendronate sodium]; Neosporin [neomycin-bacitracin zn-polymyx]; Other; Oxycodone; Statins; and Tape  Family History  Problem Relation Age of Onset  . Pulmonary embolism Mother   . Arthritis Mother   . Hypertension Mother   . Heart attack Mother   . Breast cancer Mother 56  . Stomach cancer Maternal Aunt   . Throat cancer Maternal Uncle   . Stomach cancer Maternal Grandfather     Social History Social History   Tobacco Use  . Smoking status: Never Smoker  . Smokeless tobacco: Never Used  Substance Use Topics  . Alcohol use: No  . Drug use:  No    Review of Systems Constitutional: No fever/chills Eyes: No visual changes. ENT: No sore throat. Cardiovascular: Denies chest pain. Respiratory: Denies shortness of breath. Gastrointestinal: No abdominal pain.  No nausea, no vomiting.  No diarrhea.  No constipation. Genitourinary: Negative for dysuria. Musculoskeletal: Negative for neck pain.  Negative for back pain.  Positive for right knee pain Integumentary: Negative for rash. Neurological: Negative for headaches, focal weakness or numbness.   ____________________________________________   PHYSICAL EXAM:  VITAL SIGNS: ED Triage Vitals  Enc Vitals Group     BP 07/22/17 2355 135/64     Pulse Rate 07/22/17 2355 81     Resp 07/22/17 2355 18     Temp 07/22/17 2355 98.6 F (37 C)     Temp Source 07/22/17 2355 Oral     SpO2 07/22/17 2355 94 %     Weight 07/22/17 2353 125.6 kg (277 lb)     Height 07/22/17 2353 1.651 m (5\' 5" )     Head Circumference --      Peak Flow --      Pain Score 07/22/17 2353 7     Pain Loc --      Pain Edu? --      Excl. in [place]? --     Constitutional: Alert and oriented. Well appearing and in no acute distress. Eyes: Conjunctivae are normal.  Mouth/Throat: Mucous membranes are moist. Oropharynx non-erythematous. Neck: No stridor.   Cardiovascular: Normal rate, regular rhythm. Good peripheral circulation. Grossly normal heart sounds. Respiratory: Normal respiratory effort.  No retractions. Lungs CTAB. Gastrointestinal: Soft and nontender. No distention.  Musculoskeletal: Pain with active and passive range of motion of the right knee.  No palpable effusion.  No overlying cellulitis.  Neurologic:  Normal speech and language. No gross focal neurologic deficits are appreciated.  Skin:  Skin is warm, dry and intact. No rash noted. Psychiatric: Mood and affect are normal. Speech and behavior are normal.  ____________________________________________   LABS (all labs ordered are listed, but only  abnormal results are displayed)  Labs Reviewed  COMPREHENSIVE METABOLIC PANEL - Abnormal; Notable for the following components:      Result Value  Glucose, Bld 122 (*)    All other components within normal limits  URINALYSIS, COMPLETE (UACMP) WITH MICROSCOPIC - Abnormal; Notable for the following components:   Color, Urine YELLOW (*)    APPearance HAZY (*)    Leukocytes, UA MODERATE (*)    Bacteria, UA MANY (*)    Squamous Epithelial / LPF 0-5 (*)    All other components within normal limits  CBC   _____  RADIOLOGY I, [place] Sherrilynn Gudgel, personally viewed and evaluated these images (plain radiographs) as part of my medical decision making, as well as reviewing the written report by the radiologist.  ED MD interpretation: No fracture or dislocation noted.  Official radiology report(s): Dg Knee 2 Views Right  Result Date: 07/23/2017 CLINICAL DATA:  Right knee pain. 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 No recent injury. EXAM: RIGHT KNEE - 1-2 VIEW COMPARISON:  12/04/2016 FINDINGS: Postoperative changes with right total knee arthroplasty including patellar femoral component. Components appear well seated. No evidence of acute fracture or dislocation. No focal bone lesion or bone destruction. Small right knee effusion. No radiopaque soft tissue foreign bodies. IMPRESSION: Right total knee arthroplasty. Components appear well seated. No acute fracture or dislocation. Small effusion. Electronically Signed   By: Lucienne Capers M.D.   On: 07/23/2017 01:37    ___________________  Procedures   ____________________________________________   INITIAL IMPRESSION / ASSESSMENT AND PLAN / ED COURSE  As part of my medical decision making, I reviewed the following data within the electronic MEDICAL RECORD [id number] year old female presenting the emergency part acute on chronic right knee pain.  X-ray revealed no evidence of fracture or dislocation small effusion noted in the right knee area  given absence of overlying cellulitis or collection of fluid that is amenable to aspiration in the emergency department as well as lack absence of fever or leukocytosis suspect septic joint to be unlikely.  Patient given IV morphine emergency department with resolution of pain.  Spoke with Dr. Sabra Heck orthopedic surgeon on-call for [facility] who agreed the plan for outpatient follow-up. ____________________________________________  FINAL CLINICAL IMPRESSION(S) / ED DIAGNOSES  Final diagnoses:  Chronic pain of right knee     MEDICATIONS GIVEN DURING THIS VISIT:  Medications  morphine 2 MG/ML injection 2 mg (2 mg Intravenous Given 07/23/17 0018)  fosfomycin (MONUROL) packet 3 g (3 g Oral Given 07/23/17 0156)     ED Discharge Orders        Ordered    oxyCODONE-acetaminophen (PERCOCET) 5-325 MG tablet  Every 4 hours PRN     07/23/17 0253       Note:  This document was prepared using Dragon voice recognition software and may include unintentional dictation errors.    Gregor Hams, MD 07/23/17 [id number]

## 2017-07-23 NOTE — Progress Notes (Signed)
[place] at [facility]                                                                                                                                                                                  Patient Demographics   Laura Gonzalez, is a 77 y.o. female, DOB - 1940/09/02, IPJ:[id number]  Admit date - 07/22/2017   Admitting Physician Saundra Shelling, MD  Outpatient Primary MD for the patient is [name], MD   LOS - 0  Subjective: Patient admitted with right knee pain and difficulty with walking states that pain is somewhat improved had arthrocentesis    Review of Systems:   CONSTITUTIONAL: No documented fever. No fatigue, weakness. No weight gain, no weight loss.  EYES: No blurry or double vision.  ENT: No tinnitus. No postnasal drip. No redness of the oropharynx.  RESPIRATORY: No cough, no wheeze, no hemoptysis. No dyspnea.  CARDIOVASCULAR: No chest pain. No orthopnea. No palpitations. No syncope.  GASTROINTESTINAL: No nausea, no vomiting or diarrhea. No abdominal pain. No melena or hematochezia.  GENITOURINARY: No dysuria or hematuria.  ENDOCRINE: No polyuria or nocturia. No heat or cold intolerance.  HEMATOLOGY: No anemia. No bruising. No bleeding.  INTEGUMENTARY: No rashes. No lesions.  MUSCULOSKELETAL: Right knee pain and swelling NEUROLOGIC: No numbness, tingling, or ataxia. No seizure-type activity.  PSYCHIATRIC: No anxiety. No insomnia. No ADD.    Vitals:   Vitals:   07/23/17 0243 07/23/17 0515 07/23/17 0542 07/23/17 0835  BP: 126/69 (!) 120/55 (!) 143/69 (!) 123/53  Pulse: 81 76 76 80  Resp: 18 20 18 16   Temp:   97.6 F (36.4 C) 98.2 F (36.8 C)  TempSrc:   Oral Oral  SpO2: 94% 94% 95% 95%  Weight:      Height:        Wt Readings from Last 3 Encounters:  07/22/17 277 lb (125.6 kg)  12/04/16 275 lb (124.7 kg)  09/26/16 275 lb 12.8 oz (125.1 kg)     Intake/Output Summary (Last 24 hours) at 07/23/2017 1606 Last data  filed at 07/23/2017 1417 Gross per 24 hour  Intake 360 ml  Output 700 ml  Net -340 ml    Physical Exam:   GENERAL: Pleasant-appearing in no apparent distress.  HEAD, EYES, EARS, NOSE AND THROAT: Atraumatic, normocephalic. Extraocular muscles are intact. Pupils equal and reactive to light. Sclerae anicteric. No conjunctival injection. No oro-pharyngeal erythema.  NECK: Supple. There is no jugular venous distention. No bruits, no lymphadenopathy, no thyromegaly.  HEART: Regular rate and rhythm,. No murmurs, no rubs, no clicks.  LUNGS: Clear to auscultation bilaterally. No rales or rhonchi. No wheezes.  ABDOMEN:  Soft, flat, nontender, nondistended. Has good bowel sounds. No hepatosplenomegaly appreciated.  EXTREMITIES: Right knee swelling no erythema NEUROLOGIC: The patient is alert, awake, and oriented x3 with no focal motor or sensory deficits appreciated bilaterally.  SKIN: Moist and warm with no rashes appreciated.  Psych: Not anxious, depressed LN: No inguinal LN enlargement    Antibiotics   Anti-infectives (From admission, onward)   None      Medications   Scheduled Meds: . acidophilus  1 capsule Oral Daily  . aspirin EC  81 mg Oral Daily  . cholecalciferol  5,000 Units Oral Daily  . ezetimibe  10 mg Oral Daily  . fluticasone  1 spray Each Nare Daily  . hydrochlorothiazide  12.5 mg Oral Daily  . loratadine  10 mg Oral Daily  . magnesium oxide  400 mg Oral Daily  . metoprolol tartrate  12.5 mg Oral BID  . montelukast  10 mg Oral QHS  . multivitamin with minerals  2 tablet Oral Daily  . sodium chloride flush  3 mL Intravenous Q12H  . vitamin B-12  3,000 mcg Oral Daily  . warfarin  4 mg Oral q1800  . Warfarin - Pharmacist Dosing Inpatient   Does not apply q1800   Continuous Infusions: . sodium chloride    . diltiazem (CARDIZEM CD) 300 mg     PRN Meds:.sodium chloride, albuterol, albuterol, bisacodyl, guaiFENesin **AND** dextromethorphan, ondansetron **OR**  ondansetron (ZOFRAN) IV, senna-docusate, sodium chloride flush, traMADol   Data Review:   Micro Results No results found for this or any previous visit (from the past 240 hour(s)).  Radiology Reports Dg Knee 2 Views Right  Result Date: 07/23/2017 CLINICAL DATA:  Right knee pain. 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 No recent injury. EXAM: RIGHT KNEE - 1-2 VIEW COMPARISON:  12/04/2016 FINDINGS: Postoperative changes with right total knee arthroplasty including patellar femoral component. Components appear well seated. No evidence of acute fracture or dislocation. No focal bone lesion or bone destruction. Small right knee effusion. No radiopaque soft tissue foreign bodies. IMPRESSION: Right total knee arthroplasty. Components appear well seated. No acute fracture or dislocation. Small effusion. Electronically Signed   By: Lucienne Capers M.D.   On: 07/23/2017 01:37     CBC Recent Labs  Lab 07/23/17 0009  WBC 9.3  HGB 14.6  HCT 44.6  PLT 211  MCV 91.9  MCH 30.1  MCHC 32.8  RDW 13.9    Chemistries  Recent Labs  Lab 07/23/17 0009 07/23/17 0703  NA 141 141  K 4.1 4.3  CL 104 104  CO2 26 27  GLUCOSE 122* 104*  BUN 20 17  CREATININE 0.75 0.77  CALCIUM 10.2 9.4  AST 35  --   ALT 27  --   ALKPHOS 78  --   BILITOT 0.4  --    ------------------------------------------------------------------------------------------------------------------ estimated creatinine clearance is 81 mL/min (by C-G formula based on SCr of 0.77 mg/dL). ------------------------------------------------------------------------------------------------------------------ No results for input(s): HGBA1C in the last 72 hours. ------------------------------------------------------------------------------------------------------------------ No results for input(s): CHOL, HDL, LDLCALC, TRIG, CHOLHDL, LDLDIRECT in the last 72  hours. ------------------------------------------------------------------------------------------------------------------ No results for input(s): TSH, T4TOTAL, T3FREE, THYROIDAB in the last 72 hours.  Invalid input(s): FREET3 ------------------------------------------------------------------------------------------------------------------ No results for input(s): VITAMINB12, FOLATE, FERRITIN, TIBC, IRON, RETICCTPCT in the last 72 hours.  Coagulation profile Recent Labs  Lab 07/23/17 0703  INR 2.52    No results for input(s): DDIMER in the last 72 hours.  Cardiac Enzymes No results for input(s): CKMB, TROPONINI, MYOGLOBIN in  the last 168 hours.  Invalid input(s): CK ------------------------------------------------------------------------------------------------------------------ Invalid input(s): [facility]  Patient is a 77 year old admitted with severe right knee pain and gait dysfunction  1.  Right knee pain suspect due to mechanical reasons patient states that this is been a recurrent issue now getting worse Will obtain MRI of the right knee Ask her primary orthopedic team to come evaluate Continue NSAIDs 2.  Ambulatory dysfunction obtain PT evaluation 3.  Essential hypertension continue hydrochlorothiazide and metoprolol 4.  Hyperlipidemia continue Zetia 5.  Atrial fibrillation continue Coumadin 6 morbid obesity weight loss recommended.        Code Status Orders  (From admission, onward)        Start     Ordered   07/23/17 0620  Limited resuscitation (code)  Continuous    Question Answer Comment  In the event of cardiac or respiratory ARREST: Initiate Code Blue, Call Rapid Response Yes   In the event of cardiac or respiratory ARREST: Perform CPR No   In the event of cardiac or respiratory ARREST: Perform Intubation/Mechanical Ventilation Yes   In the event of cardiac or respiratory ARREST: Use NIPPV/BiPAp only if indicated Yes   In the event  of cardiac or respiratory ARREST: Administer ACLS medications if indicated Yes   In the event of cardiac or respiratory ARREST: Perform Defibrillation or Cardioversion if indicated Yes      07/23/17 0619    Code Status History    Date Active Date Inactive Code Status Order ID Comments User Context   07/23/2017 05:39 07/23/2017 06:19 Full Code [id number]  Saundra Shelling, MD Inpatient   09/23/2016 19:28 09/26/2016 16:20 Partial Code [id number]  Bettey Costa, MD Inpatient   03/24/2016 15:11 04/01/2016 17:55 Full Code [id number]  Wilhelmina Mcardle, MD ED   12/12/2014 03:01 12/20/2014 19:58 Full Code [id number]  Lance Coon, MD Inpatient           Consults orthopedic surgery  DVT Prophylaxis Coumadin  Lab Results  Component Value Date   PLT 211 07/23/2017     Time Spent in minutes   45 minutes greater than 50% of time spent in care coordination and counseling patient regarding the condition and plan of care.   Dustin Flock M.D on 07/23/2017 at 4:06 PM  Between 7am to 6pm - Pager - [phone]  After 6pm go to [email or web address] - password [email or web address] [place] [facility]   Office  [phone]

## 2017-07-23 NOTE — Care Management Obs Status (Signed)
MEDICARE OBSERVATION STATUS NOTIFICATION   Patient Details  Name: Laura Gonzalez MRN: [id number] Date of Birth: February 05, 1941   Medicare Observation Status Notification Given:  Yes    Jolly Mango, RN 07/23/2017, 11:43 AM

## 2017-07-23 NOTE — Progress Notes (Signed)
ANTICOAGULATION CONSULT NOTE - Initial Consult  Pharmacy Consult for warfarin Indication: atrial fibrillation  Allergies  Allergen Reactions  . Amoxicillin Other (See Comments)    Lips swelling, tingling  . Benzocaine-Menthol     Throat swelling  . Biafine [Wound Dressings] Swelling  . Chloraseptic Sore Throat [Acetaminophen] Other (See Comments)    throat swelling  . Fosamax [Alendronate Sodium]   . Neosporin [Neomycin-Bacitracin Zn-Polymyx] Other (See Comments)    Skin redness, puffy  . Other     "chlorotrimitron"-- rxn: throat swelling  . Oxycodone Other (See Comments)    Dizziness, lips tingling  . Statins Other (See Comments)    Muscle weakness, weak  . Tape Other (See Comments)    Skin redness    Patient Measurements: Height: 5\' 5"  (165.1 cm) Weight: 277 lb (125.6 kg) IBW/kg (Calculated) : 57  Vital Signs: Temp: 98.2 F (36.8 C) (02/01 0835) Temp Source: Oral (02/01 0835) BP: 123/53 (02/01 0835) Pulse Rate: 80 (02/01 0835)  Labs: Recent Labs    07/23/17 0009 07/23/17 0703  HGB 14.6  --   HCT 44.6  --   PLT 211  --   APTT  --  47*  LABPROT  --  27.0*  INR  --  2.52  CREATININE 0.75 0.77    Estimated Creatinine Clearance: 81 mL/min (by C-G formula based on SCr of 0.77 mg/dL).  Assessment: Pharmacy consulted to dose and monitor warfarin in this 77 year old female who was taking warfarin prior to admission.   Patient reports taking warfarin 4 mg PO daily and reports taking dose on 1/31 prior to admission.   Dosing History Date INR Dose 2/1 2.5  Goal of Therapy:  INR 2-3   Plan:  Continue home dose of warfarin 4 mg PO daily. INR to be rechecked with AM labs tomorrow.  Lenis Noon, PharmD, BCPS Clinical Pharmacist 07/23/2017,10:24 AM

## 2017-07-23 NOTE — Care Management Note (Addendum)
Case Management Note  Patient Details  Name: Laura Gonzalez MRN: [id number] Date of Birth: July 27, 1940  Subjective/Objective:  Spoke with patient. She lives at home alone. Has a walker. Drives about one time a week. No home health. Independent prior to admission. PCP is Harrel Lemon, last seen 07/15/2017.                    Action/Plan: Will assess for home health PT prior to DC. Is not a candidate for SNF unless she would like to private pay due to observation status.     Expected Discharge Date:  07/25/17               Expected Discharge Plan:     In-House Referral:     Discharge planning Services     Post Acute Care Choice:    Choice offered to:     DME Arranged:    DME Agency:     HH Arranged:    HH Agency:     Status of Service:     If discussed at [employer], dates discussed:    Additional Comments:  Jolly Mango, RN 07/23/2017, 11:53 AM

## 2017-07-23 NOTE — H&P (Addendum)
[facility] NAME: Laura Gonzalez    MR#:  [id number]  DATE OF BIRTH:  Mar 29, 1941  DATE OF ADMISSION:  07/22/2017  PRIMARY CARE PHYSICIAN: [name], MD   REQUESTING/REFERRING PHYSICIAN:   CHIEF COMPLAINT:   Chief Complaint  Patient presents with  . Knee Pain    HISTORY OF PRESENT ILLNESS: Laura Gonzalez  is a 77 y.o. female with a known history of arthritis of the knee and wrist, skin cancer, bronchial asthma, GERD, hyperlipidemia, hypertension, osteoporosis, sleep apnea presented to the emergency room with right knee pain.  Patient had a right knee total replacement couple of years ago.  She has more pain since yesterday and unable to ambulate and bend the knee.  She is not able to ambulate and move around at home because of the pain.  Patient lives alone and no one to take care of her.  She was put on a brace in the emergency room imaging studies showed no fracture hospitalist service was consulted.  PAST MEDICAL HISTORY:   Past Medical History:  Diagnosis Date  . Arthritis    wrist and knees  . Asthma   . Cancer ([place])    skin cancer  . Carpal tunnel syndrome   . Chicken pox   . Degenerative arthritis of knee, bilateral   . Diverticulitis   . Diverticulosis   . Dysrhythmia    afib  . GERD (gastroesophageal reflux disease)   . Hernia, umbilical   . Hyperlipidemia   . Hypertension   . Numbness and tingling    arm to leg  . Osteoporosis   . Pneumonia   . Shortness of breath    only with astma attacks  . Sleep apnea     PAST SURGICAL HISTORY:  Past Surgical History:  Procedure Laterality Date  . ANTERIOR CERVICAL DECOMP/DISCECTOMY FUSION N/A 08/09/2012   Procedure: ANTERIOR CERVICAL DECOMPRESSION/DISCECTOMY FUSION 2 LEVELS;  Surgeon: [name], MD;  Location: [facility]URO ORS;  Service: Neurosurgery;  Laterality: N/A;  C3-4 C4-5 Anterior cervical decompression/diskectomy/fusion/[employer] Bone/Trestle plate   . BREAST BIOPSY Left 1984   EXCISIONAL - NEG  . BREAST BIOPSY Left 1987   EXCISIONAL - NEG  . BREAST SURGERY    . COLONOSCOPY    . COLONOSCOPY WITH PROPOFOL N/A 07/29/2015   Procedure: COLONOSCOPY WITH PROPOFOL;  Surgeon: [name], MD;  Location: [facility] ENDOSCOPY;  Service: Endoscopy;  Laterality: N/A;  . colonscopy  2000,2007,2012  . ESOPHAGOGASTRODUODENOSCOPY    . HERNIA REPAIR    . JOINT REPLACEMENT Right    Total Knee Replacement  . LIPOMA EXCISION Right 2010   back  . MASTECTOMY PARTIAL / LUMPECTOMY Left 1980s  . ROTATOR CUFF REPAIR Bilateral right- 2009, left 2011  . SKIN CANCER EXCISION  2009   back of neck and right cheek  . TONSILLECTOMY  1960  . TRACHEOSTOMY    . UMBILICAL HERNIA REPAIR  [id number]  . VARICOSE VEIN SURGERY Left 04/2009 rt 2011  . Impact  . WRIST SURGERY Right 1998   external fixator    SOCIAL HISTORY:  Social History   Tobacco Use  . Smoking status: Never Smoker  . Smokeless tobacco: Never Used  Substance Use Topics  . Alcohol use: No    FAMILY HISTORY:  Family History  Problem Relation Age of Onset  . Pulmonary embolism Mother   . Arthritis Mother   . Hypertension Mother   .  Heart attack Mother   . Breast cancer Mother 29  . Stomach cancer Maternal Aunt   . Throat cancer Maternal Uncle   . Stomach cancer Maternal Grandfather     DRUG ALLERGIES:  Allergies  Allergen Reactions  . Amoxicillin Other (See Comments)    Lips swelling, tingling  . Benzocaine-Menthol     Throat swelling  . Biafine [Wound Dressings] Swelling  . Chloraseptic Sore Throat [Acetaminophen] Other (See Comments)    throat swelling  . Fosamax [Alendronate Sodium]   . Neosporin [Neomycin-Bacitracin Zn-Polymyx] Other (See Comments)    Skin redness, puffy  . Other     "chlorotrimitron"-- rxn: throat swelling  . Oxycodone Other (See Comments)    Dizziness, lips tingling  . Statins Other (See Comments)    Muscle weakness, weak  .  Tape Other (See Comments)    Skin redness    REVIEW OF SYSTEMS:   CONSTITUTIONAL: No fever, fatigue or weakness.  EYES: No blurred or double vision.  EARS, NOSE, AND THROAT: No tinnitus or ear pain.  RESPIRATORY: No cough, shortness of breath, wheezing or hemoptysis.  CARDIOVASCULAR: No chest pain, orthopnea, edema.  GASTROINTESTINAL: No nausea, vomiting, diarrhea or abdominal pain.  GENITOURINARY: No dysuria, hematuria.  ENDOCRINE: No polyuria, nocturia,  HEMATOLOGY: No anemia, easy bruising or bleeding SKIN: No rash or lesion. MUSCULOSKELETAL: Has right knee joint pain , has arthritis.   NEUROLOGIC: No tingling, numbness, weakness.  PSYCHIATRY: No anxiety or depression.   MEDICATIONS AT HOME:  Prior to Admission medications   Medication Sig Start Date End Date Taking? Authorizing Provider  acetaminophen (TYLENOL) 325 MG tablet Take 650 mg by mouth every 6 (six) hours as needed for mild pain, moderate pain or headache.   Yes [provider]  acidophilus (RISAQUAD) CAPS capsule Take 1 capsule by mouth daily.   Yes [provider]  albuterol (PROVENTIL HFA;VENTOLIN HFA) 108 (90 BASE) MCG/ACT inhaler Inhale 2 puffs into the lungs every 6 (six) hours as needed. For shortness of breath   Yes [provider]  albuterol (PROVENTIL) (2.5 MG/3ML) 0.083% nebulizer solution Take 2.5 mg by nebulization every 6 (six) hours as needed for wheezing or shortness of breath.   Yes [provider]  alendronate (FOSAMAX) 70 MG tablet Take 70 mg by mouth once a week. Take with a full glass of water on an empty stomach. On Sundays   Yes [provider]  Aloe-Sodium Chloride (AYR SALINE NASAL GEL NA) Place 1 application into the nose as needed.   Yes [provider]  Alum Hydroxide-Mag Carbonate ([employer] HEARTBURN RELIEF PO) Take 1 tablet by mouth as needed.   Yes [provider]  aspirin EC 81 MG tablet Take 81 mg by mouth daily as needed.    Yes  [provider]  Calcium Citrate (CITRACAL PO) Take 2 tablets by mouth daily.    Yes [provider]  Cholecalciferol (VITAMIN D3) 5000 UNITS CAPS Take 1 capsule by mouth daily.   Yes [provider]  diltiazem (CARDIZEM CD) 300 MG 24 hr capsule Take 300 mg by mouth daily.   Yes [provider]  diltiazem (CARDIZEM) 60 MG tablet Take 60 mg by mouth every 6 (six) hours as needed. Takes if has palpitation after taking diltiazem 300mg  dose    Yes [provider]  diphenhydrAMINE (BENADRYL) 25 MG tablet Take 25 mg by mouth every 6 (six) hours as needed.   Yes [provider]  ezetimibe (ZETIA) 10 MG tablet  Take 10 mg by mouth daily.    Yes [provider]  hydrochlorothiazide (HYDRODIURIL) 12.5 MG tablet Take 1 tablet (12.5 mg total) by mouth daily. 04/01/16  Yes Gladstone Lighter, MD  loratadine (CLARITIN) 10 MG tablet Take 10 mg by mouth daily.   Yes [provider]  magnesium oxide (MAG-OX) 400 MG tablet Take 800 mg by mouth daily.    Yes [provider]  MEGARED OMEGA-3 KRILL OIL 500 MG CAPS Take 1 capsule by mouth daily.   Yes [provider]  metoprolol tartrate (LOPRESSOR) 25 MG tablet Take 12.5 mg by mouth 2 (two) times daily.    Yes [provider]  montelukast (SINGULAIR) 10 MG tablet Take 10 mg by mouth at bedtime.   Yes [provider]  Multiple Vitamin (MULTIVITAMIN WITH MINERALS) TABS Take 2 tablets by mouth daily.    Yes [provider]  Multiple Vitamins-Minerals (PRESERVISION AREDS PO) Take 1 tablet by mouth daily.   Yes [provider]  potassium chloride SA (K-DUR,KLOR-CON) 20 MEQ tablet Take 1 tablet (20 mEq total) by mouth 2 (two) times daily. Patient taking differently: Take 20 mEq by mouth 3 (three) times daily.  04/01/16  Yes Gladstone Lighter, MD  trolamine salicylate (ASPERCREME) 10 % cream Apply 1 application topically as needed for muscle pain.   Yes  [provider]  vitamin B-12 (CYANOCOBALAMIN) 1000 MCG tablet Take 3,000 mcg by mouth daily.   Yes [provider]  warfarin (COUMADIN) 2 MG tablet Take 1 tablet (2 mg total) by mouth daily. Patient taking differently: Take 4 mg by mouth daily.  12/20/14  Yes Fritzi Mandes, MD  azithromycin (ZITHROMAX) 500 MG tablet Take 1 tablet (500 mg total) by mouth daily. Patient not taking: Reported on 07/23/2017 09/26/16   Demetrios Loll, MD  bisacodyl (BISACODYL) 5 MG EC tablet Take 5 mg by mouth daily as needed for moderate constipation.    [provider]  dextromethorphan-guaiFENesin (MUCINEX DM) 30-600 MG 12hr tablet Take 1 tablet by mouth 2 (two) times daily as needed for cough. Patient not taking: Reported on 07/23/2017 09/26/16   Demetrios Loll, MD  fluticasone [facility]) 50 MCG/ACT nasal spray Place 1 spray into both nostrils daily.     [provider]  mometasone-formoterol (DULERA) 200-5 MCG/ACT AERO Inhale 2 puffs into the lungs 2 (two) times daily. Patient not taking: Reported on 09/23/2016 04/01/16   Gladstone Lighter, MD  oxyCODONE-acetaminophen (PERCOCET) 5-325 MG tablet Take 1 tablet by mouth every 4 (four) hours as needed for severe pain. 07/23/17   Gregor Hams, MD  predniSONE (DELTASONE) 10 MG tablet 40 mg po daily for 2 days, 20 mg po daily for 2 days, 10 mg po daily for 3 days. Patient not taking: Reported on 07/23/2017 09/26/16   Demetrios Loll, MD      PHYSICAL EXAMINATION:   VITAL SIGNS: Blood pressure (!) 120/55, pulse 76, temperature 98.6 F (37 C), temperature source Oral, resp. rate 20, height 5\' 5"  (1.651 m), weight 125.6 kg (277 lb), SpO2 94 %.  GENERAL:  77 y.o.-year-old patient lying in the bed with no acute distress.  EYES: Pupils equal, round, reactive to light and accommodation. No scleral icterus. Extraocular muscles intact.  HEENT: Head atraumatic, normocephalic. Oropharynx and nasopharynx clear.  NECK:  Supple, no jugular venous distention. No thyroid  enlargement, no tenderness.  LUNGS: Normal breath sounds bilaterally, no wheezing, rales,rhonchi or crepitation. No use of accessory muscles of respiration.  CARDIOVASCULAR: S1, S2 normal.  No murmurs, rubs, or gallops.  ABDOMEN: Soft, nontender, nondistended. Bowel sounds present. No organomegaly or mass.  EXTREMITIES: No pedal edema, cyanosis, or clubbing.  Tenderness right knee and ankle NEUROLOGIC: Cranial nerves II through XII are intact. Muscle strength 5/5 in all extremities. Sensation intact. Gait not checked.  PSYCHIATRIC: The patient is alert and oriented x 3.  SKIN: No obvious rash, lesion, or ulcer.   LABORATORY PANEL:   CBC Recent Labs  Lab 07/23/17 0009  WBC 9.3  HGB 14.6  HCT 44.6  PLT 211  MCV 91.9  MCH 30.1  MCHC 32.8  RDW 13.9   ------------------------------------------------------------------------------------------------------------------  Chemistries  Recent Labs  Lab 07/23/17 0009  NA 141  K 4.1  CL 104  CO2 26  GLUCOSE 122*  BUN 20  CREATININE 0.75  CALCIUM 10.2  AST 35  ALT 27  ALKPHOS 78  BILITOT 0.4   ------------------------------------------------------------------------------------------------------------------ estimated creatinine clearance is 81 mL/min (by C-G formula based on SCr of 0.75 mg/dL). ------------------------------------------------------------------------------------------------------------------ No results for input(s): TSH, T4TOTAL, T3FREE, THYROIDAB in the last 72 hours.  Invalid input(s): FREET3   Coagulation profile No results for input(s): INR, PROTIME in the last 168 hours. ------------------------------------------------------------------------------------------------------------------- No results for input(s): DDIMER in the last 72 hours. -------------------------------------------------------------------------------------------------------------------  Cardiac Enzymes No results for input(s): CKMB,  TROPONINI, MYOGLOBIN in the last 168 hours.  Invalid input(s): CK ------------------------------------------------------------------------------------------------------------------ Invalid input(s): POCBNP  ---------------------------------------------------------------------------------------------------------------  Urinalysis    Component Value Date/Time   COLORURINE YELLOW (A) 07/23/2017 0100   APPEARANCEUR HAZY (A) 07/23/2017 0100   APPEARANCEUR Clear 05/30/2014 1003   LABSPEC 1.018 07/23/2017 0100   LABSPEC 1.015 05/30/2014 1003   PHURINE 5.0 07/23/2017 0100   GLUCOSEU NEGATIVE 07/23/2017 0100   GLUCOSEU Negative 05/30/2014 1003   HGBUR NEGATIVE 07/23/2017 0100   BILIRUBINUR NEGATIVE 07/23/2017 0100   BILIRUBINUR Negative 05/30/2014 1003   KETONESUR NEGATIVE 07/23/2017 0100   PROTEINUR NEGATIVE 07/23/2017 0100   UROBILINOGEN 0.2 08/02/2012 0941   NITRITE NEGATIVE 07/23/2017 0100   LEUKOCYTESUR MODERATE (A) 07/23/2017 0100   LEUKOCYTESUR 1+ 05/30/2014 1003     RADIOLOGY: Dg Knee 2 Views Right  Result Date: 07/23/2017 CLINICAL DATA:  Right knee pain. 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 No recent injury. EXAM: RIGHT KNEE - 1-2 VIEW COMPARISON:  12/04/2016 FINDINGS: Postoperative changes with right total knee arthroplasty including patellar femoral component. Components appear well seated. No evidence of acute fracture or dislocation. No focal bone lesion or bone destruction. Small right knee effusion. No radiopaque soft tissue foreign bodies. IMPRESSION: Right total knee arthroplasty. Components appear well seated. No acute fracture or dislocation. Small effusion. Electronically Signed   By: Lucienne Capers M.D.   On: 07/23/2017 01:37    EKG: Orders placed or performed during the hospital encounter of 09/23/16  . EKG 12-Lead  . EKG 12-Lead  . ED EKG  . ED EKG    IMPRESSION AND PLAN: 77 year old female patient with history of degenerative joint disease,  arthritis of the knees, skin cancer, GERD, hypertension, hyperlipidemia presented to the emergency room with pain in the right knee and unable to ambulate.  Admitting diagnosis 1.  Ambulatory dysfunction 2.  Gait instability 3.  Right knee pain 4.  Degenerative joint disease 5.  GERD 6.  Hypertension Treatment plan Admit patient to medical floor observation bed Start patient on oral Percocet for pain Physical therapy evaluation Assessment for any rehab placement Home assessment   All the records are reviewed and case discussed with ED provider.  Management plans discussed with the patient, family and they are in agreement.  CODE STATUS: Partial code Code Status History    Date Active Date Inactive Code Status Order ID Comments User Context   09/23/2016 19:28 09/26/2016 16:20 Partial Code [id number]  Bettey Costa, MD Inpatient   03/24/2016 15:11 04/01/2016 17:55 Full Code [id number]  Wilhelmina Mcardle, MD ED   12/12/2014 03:01 12/20/2014 19:58 Full Code [id number]  Lance Coon, MD Inpatient    Questions for Most Recent Historical Code Status (Order [id number])    Question Answer Comment   In the event of cardiac or respiratory ARREST: Initiate Code Blue, Call Rapid Response Yes    In the event of cardiac or respiratory ARREST: Perform CPR Yes    In the event of cardiac or respiratory ARREST: Perform Intubation/Mechanical Ventilation No    In the event of cardiac or respiratory ARREST: Use NIPPV/BiPAp only if indicated Yes    In the event of cardiac or respiratory ARREST: Administer ACLS medications if indicated Yes    In the event of cardiac or respiratory ARREST: Perform Defibrillation or Cardioversion if indicated Yes        TOTAL TIME TAKING CARE OF THIS PATIENT: 50 minutes.    Saundra Shelling M.D on 07/23/2017 at 5:17 AM  Between 7am to 6pm - Pager - [phone]  After 6pm go to [email or web address] - password [email or web address] [facility]  [place] [facility]  Office  [phone]  CC: Primary  care physician; [name], MD

## 2017-07-24 ENCOUNTER — Observation Stay: Payer: Medicare Other

## 2017-07-24 LAB — PROTIME-INR
INR: 2.48
PROTHROMBIN TIME: 26.6 s — AB (ref 11.4–15.2)

## 2017-07-24 MED ORDER — CIPROFLOXACIN HCL 500 MG PO TABS
500.0000 mg | ORAL_TABLET | Freq: Two times a day (BID) | ORAL | Status: DC
  Administered 2017-07-24 – 2017-07-25 (×3): 500 mg via ORAL
  Filled 2017-07-24 (×3): qty 1

## 2017-07-24 NOTE — Evaluation (Signed)
Physical Therapy Evaluation Patient Details Name: Laura Gonzalez MRN: [id number] DOB: 1940/09/04 Today's Date: 07/24/2017   History of Present Illness  Pt is a 77 y.o. female with a known history of arthritis of the knee and wrist, skin cancer, bronchial asthma, GERD, hyperlipidemia, hypertension, osteoporosis, sleep apnea presented to the emergency room with right knee pain.  Patient had a right knee total replacement couple of years ago.  She has more pain since yesterday and unable to ambulate and bend the knee.  She is not able to ambulate and move around at home because of the pain.  Patient lives alone and no one to take care of her.  She was put on a brace in the emergency room imaging studies showed no fracture hospitalist service was consulted  Clinical Impression  Pt in bed with knee immobilizer donned upon PT arrival. Pt reported 0/10 pain in R knee at rest and 3/10 pain with ambulation.  Pt performed bed mobility with using bed rails and requiring increased time.  PT provided min-mod assistance for STS using RW from bed and toilet as well as VC's for body mechanics while wearing knee immobilizer.  Pt ambulated 40 ft in rm with RW and CGA.  PT provided education concerning ambulation with knee immobilizer donned and pt was able to demonstrate understanding.  Pt presented with overall WNL strength of UE and LE and reported no sensation loss.  Pt will continue to benefit from skilled PT with focus on functional mobility, tolerance to activity and pain management.    Follow Up Recommendations SNF    Equipment Recommendations       Recommendations for Other Services       Precautions / Restrictions        Mobility  Bed Mobility Overal bed mobility: Modified Independent                Transfers Overall transfer level: Needs assistance Equipment used: Rolling walker (2 wheeled) Transfers: Sit to/from Stand Sit to Stand: Min assist         General transfer comment: Min A  to initiate STS.  Pt is aware of proper hand placement and body mechaincs.  PT educated pt on modification of body mechanics with use of R knee immobilizer.  Ambulation/Gait Ambulation/Gait assistance: Supervision Ambulation Distance (Feet): 50 Feet Assistive device: Rolling walker (2 wheeled) Gait Pattern/deviations: Wide base of support   Gait velocity interpretation: Below normal speed for age/gender    Stairs            Wheelchair Mobility    Modified Rankin (Stroke Patients Only)       Balance Overall balance assessment: Modified Independent                                           Pertinent Vitals/Pain Pain Assessment: 0-10 Pain Score: 3  Pain Intervention(s): Limited activity within patient's tolerance    Home Living Family/patient expects to be discharged to:: Private residence Living Arrangements: Alone   Type of Home: Apartment Home Access: Elevator     Home Layout: One level Home Equipment: Shower seat(3 Wheel walker)      Prior Function Level of Independence: Independent with assistive device(s)         Comments: Drives 1x/week to do errands/grocery shopping.     Hand Dominance        Extremity/Trunk Assessment  Upper Extremity Assessment Upper Extremity Assessment: Overall WFL for tasks assessed    Lower Extremity Assessment Lower Extremity Assessment: Overall WFL for tasks assessed       Communication   Communication: No difficulties  Cognition Arousal/Alertness: Awake/alert Behavior During Therapy: WFL for tasks assessed/performed Overall Cognitive Status: Within Functional Limits for tasks assessed                                        General Comments      Exercises     Assessment/Plan    PT Assessment Patient needs continued PT services  PT Problem List Decreased mobility;Decreased balance;Decreased range of motion       PT Treatment Interventions DME  instruction;Therapeutic activities;Gait training;Therapeutic exercise;Stair training;Balance training;Functional mobility training;Neuromuscular re-education;Patient/family education    PT Goals (Current goals can be found in the Care Plan section)  Acute Rehab PT Goals Patient Stated Goal: to return home and be able to ambulate around [place]. PT Goal Formulation: With patient Time For Goal Achievement: 08/07/17 Potential to Achieve Goals: Good    Frequency Min 2X/week   Barriers to discharge        Co-evaluation               AM-PAC PT "6 Clicks" Daily Activity  Outcome Measure Difficulty turning over in bed (including adjusting bedclothes, sheets and blankets)?: A Little Difficulty moving from lying on back to sitting on the side of the bed? : A Little Difficulty sitting down on and standing up from a chair with arms (e.g., wheelchair, bedside commode, etc,.)?: A Little Help needed moving to and from a bed to chair (including a wheelchair)?: A Little Help needed walking in hospital room?: A Little Help needed climbing 3-5 steps with a railing? : A Little 6 Click Score: 18    End of Session Equipment Utilized During Treatment: Gait belt;Right knee immobilizer Activity Tolerance: Patient tolerated treatment well          Time: 1550-1610 PT Time Calculation (min) (ACUTE ONLY): 20 min   Charges:   PT Evaluation $PT Eval Low Complexity: 1 Low PT Treatments $Therapeutic Activity: 8-22 mins   PT G Codes:   PT G-Codes **NOT FOR INPATIENT CLASS** Functional Assessment Tool Used: AM-PAC 6 Clicks Basic Mobility    Roxanne Gates, PT, DPT   Roxanne Gates 07/24/2017, 4:18 PM

## 2017-07-24 NOTE — Progress Notes (Signed)
ANTICOAGULATION CONSULT NOTE - Follow up Laura Gonzalez for warfarin Indication: atrial fibrillation  Allergies  Allergen Reactions  . Amoxicillin Other (See Comments)    Lips swelling, tingling  . Benzocaine-Menthol     Throat swelling  . Biafine [Wound Dressings] Swelling  . Chloraseptic Sore Throat [Acetaminophen] Other (See Comments)    throat swelling  . Fosamax [Alendronate Sodium]   . Neosporin [Neomycin-Bacitracin Zn-Polymyx] Other (See Comments)    Skin redness, puffy  . Other     "chlorotrimitron"-- rxn: throat swelling  . Oxycodone Other (See Comments)    Dizziness, lips tingling  . Statins Other (See Comments)    Muscle weakness, weak  . Tape Other (See Comments)    Skin redness    Patient Measurements: Height: 5\' 5"  (165.1 cm) Weight: 277 lb (125.6 kg) IBW/kg (Calculated) : 57  Vital Signs: Temp: 97.9 F (36.6 C) (02/02 0346) BP: 111/54 (02/02 0346) Pulse Rate: 63 (02/02 0346)  Labs: Recent Labs    07/23/17 0009 07/23/17 0703 07/24/17 0349  HGB 14.6  --   --   HCT 44.6  --   --   PLT 211  --   --   APTT  --  47*  --   LABPROT  --  27.0* 26.6*  INR  --  2.52 2.48  CREATININE 0.75 0.77  --     Estimated Creatinine Clearance: 81 mL/min (by C-G formula based on SCr of 0.77 mg/dL).  Assessment: Pharmacy consulted to dose and monitor warfarin in this 77 year old female who was taking warfarin prior to admission.   Patient reports taking warfarin 4 mg PO daily and reports taking dose on 1/31 prior to admission.   Dosing History Date INR Dose 2/1 2.5        4mg  2/2       2.48  Goal of Therapy:  INR 2-3   Plan:  Continue home dose of warfarin 4 mg PO daily. INR to be rechecked with AM labs tomorrow.  Olivia Canter, [facility] Clinical Pharmacist 07/24/2017,8:59 AM

## 2017-07-24 NOTE — Progress Notes (Addendum)
Trouble breathing 78 on room air for first time sound Physicians - [place] at [facility]                                                                                                                                                                                  Patient Demographics   Laura Gonzalez, is a 77 y.o. female, DOB - [birth year], IEP:[id number]  Admit date - 07/22/2017   Admitting Physician Saundra Shelling, MD  Outpatient Primary MD for the patient is [name], MD   LOS - 0  Subjective: Right knee pain is better but she is not moving since she is admitted.  Waiting for MRI of the right knee.  No other complaints.  Patient told me that right knee pain is getting progressively worse and she is having trouble ambulating for the past 3 4 days, went to [facility] and see Dr. Marica Otter, patient had arthrocentesis.  Review of Systems:   CONSTITUTIONAL: No documented fever. No fatigue, weakness. No weight gain, no weight loss.  EYES: No blurry or double vision.  ENT: No tinnitus. No postnasal drip. No redness of the oropharynx.  RESPIRATORY: No cough, no wheeze, no hemoptysis. No dyspnea.  CARDIOVASCULAR: No chest pain. No orthopnea. No palpitations. No syncope.  GASTROINTESTINAL: No nausea, no vomiting or diarrhea. No abdominal pain. No melena or hematochezia.  GENITOURINARY: No dysuria or hematuria.  ENDOCRINE: No polyuria or nocturia. No heat or cold intolerance.  HEMATOLOGY: No anemia. No bruising. No bleeding.  INTEGUMENTARY: No rashes. No lesions.  MUSCULOSKELETAL: Right knee pain and swelling, has immobilizer present for  right knee. NEUROLOGIC: No numbness, tingling, or ataxia. No seizure-type activity.  PSYCHIATRIC: No anxiety. No insomnia. No ADD.    Vitals:   Vitals:   07/23/17 0542 07/23/17 0835 07/23/17 1932 07/24/17 0346  BP: (!) 143/69 (!) 123/53 (!) 116/42 (!) 111/54  Pulse: 76 80 78 63  Resp: 18 16 16 16   Temp: 97.6 F (36.4 C) 98.2 F (36.8  C) 98.2 F (36.8 C) 97.9 F (36.6 C)  TempSrc: Oral Oral Oral   SpO2: 95% 95% 93% 95%  Weight:      Height:        Wt Readings from Last 3 Encounters:  07/22/17 125.6 kg (277 lb)  12/04/16 124.7 kg (275 lb)  09/26/16 125.1 kg (275 lb 12.8 oz)     Intake/Output Summary (Last 24 hours) at 07/24/2017 6606 Last data filed at 07/23/2017 1908 Gross per 24 hour  Intake 600 ml  Output 400 ml  Net 200 ml    Physical Exam:   GENERAL: Pleasant-appearing in no apparent distress.  HEAD, EYES, EARS,  NOSE AND THROAT: Atraumatic, normocephalic. Extraocular muscles are intact. Pupils equal and reactive to light. Sclerae anicteric. No conjunctival injection. No oro-pharyngeal erythema.  NECK: Supple. There is no jugular venous distention. No bruits, no lymphadenopathy, no thyromegaly.  HEART: Regular rate and rhythm,. No murmurs, no rubs, no clicks.  LUNGS: Clear to auscultation bilaterally. No rales or rhonchi. No wheezes.  ABDOMEN: Soft, flat, nontender, nondistended. Has good bowel sounds. No hepatosplenomegaly appreciated.  EXTREMITIES: Immobilizer present for the right knee.  NEUROLOGIC: The patient is alert, awake, and oriented x3 with no focal motor or sensory deficits appreciated bilaterally.  SKIN: Moist and warm with no rashes appreciated.  Psych: Not anxious, depressed LN: No inguinal LN enlargement    Antibiotics   Anti-infectives (From admission, onward)   None      Medications   Scheduled Meds: . acidophilus  1 capsule Oral Daily  . aspirin EC  81 mg Oral Daily  . cholecalciferol  5,000 Units Oral Daily  . diltiazem  120 mg Oral Daily   And  . diltiazem  180 mg Oral Daily  . ezetimibe  10 mg Oral Daily  . fluticasone  1 spray Each Nare Daily  . hydrochlorothiazide  12.5 mg Oral Daily  . ibuprofen  400 mg Oral TID  . loratadine  10 mg Oral Daily  . magnesium oxide  400 mg Oral Daily  . metoprolol tartrate  12.5 mg Oral BID  . montelukast  10 mg Oral QHS  .  multivitamin with minerals  2 tablet Oral Daily  . sodium chloride flush  3 mL Intravenous Q12H  . vitamin B-12  3,000 mcg Oral Daily  . warfarin  4 mg Oral q1800  . Warfarin - Pharmacist Dosing Inpatient   Does not apply q1800   Continuous Infusions: . sodium chloride     PRN Meds:.sodium chloride, albuterol, albuterol, bisacodyl, guaiFENesin **AND** dextromethorphan, ondansetron **OR** ondansetron (ZOFRAN) IV, senna-docusate, sodium chloride flush, traMADol   Data Review:   Micro Results No results found for this or any previous visit (from the past 240 hour(s)).  Radiology Reports Dg Knee 2 Views Right  Result Date: 07/23/2017 CLINICAL DATA:  Right knee pain. 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 No recent injury. EXAM: RIGHT KNEE - 1-2 VIEW COMPARISON:  12/04/2016 FINDINGS: Postoperative changes with right total knee arthroplasty including patellar femoral component. Components appear well seated. No evidence of acute fracture or dislocation. No focal bone lesion or bone destruction. Small right knee effusion. No radiopaque soft tissue foreign bodies. IMPRESSION: Right total knee arthroplasty. Components appear well seated. No acute fracture or dislocation. Small effusion. Electronically Signed   By: Lucienne Capers M.D.   On: 07/23/2017 01:37     CBC Recent Labs  Lab 07/23/17 0009  WBC 9.3  HGB 14.6  HCT 44.6  PLT 211  MCV 91.9  MCH 30.1  MCHC 32.8  RDW 13.9    Chemistries  Recent Labs  Lab 07/23/17 0009 07/23/17 0703  NA 141 141  K 4.1 4.3  CL 104 104  CO2 26 27  GLUCOSE 122* 104*  BUN 20 17  CREATININE 0.75 0.77  CALCIUM 10.2 9.4  AST 35  --   ALT 27  --   ALKPHOS 78  --   BILITOT 0.4  --    ------------------------------------------------------------------------------------------------------------------ estimated creatinine clearance is 81 mL/min (by C-G formula based on SCr of 0.77  mg/dL). ------------------------------------------------------------------------------------------------------------------ No results for input(s): HGBA1C in the last 72 hours. ------------------------------------------------------------------------------------------------------------------ No  results for input(s): CHOL, HDL, LDLCALC, TRIG, CHOLHDL, LDLDIRECT in the last 72 hours. ------------------------------------------------------------------------------------------------------------------ No results for input(s): TSH, T4TOTAL, T3FREE, THYROIDAB in the last 72 hours.  Invalid input(s): FREET3 ------------------------------------------------------------------------------------------------------------------ No results for input(s): VITAMINB12, FOLATE, FERRITIN, TIBC, IRON, RETICCTPCT in the last 72 hours.  Coagulation profile Recent Labs  Lab 07/23/17 0703 07/24/17 0349  INR 2.52 2.48    No results for input(s): DDIMER in the last 72 hours.  Cardiac Enzymes No results for input(s): CKMB, TROPONINI, MYOGLOBIN in the last 168 hours.  Invalid input(s): CK ------------------------------------------------------------------------------------------------------------------ Invalid input(s): [place]  Patient is a 77 year old admitted with severe right knee pain and gait dysfunction  1.  Right knee pain suspect due to mechanical reasons patient states that this is been a recurrent issue now getting worse MRI of the right knee is pending.  Patient can be out of the bed to chair .   Follow MRI results.  Patient had right knee effusion for which she had arthrocentesis at [facility] Ask her primary orthopedic team to come evaluate Continue NSAIDs 2.  Ambulatory dysfunction; PT evaluation 3.  Essential hypertension continue hydrochlorothiazide and metoprolol 4.  Hyperlipidemia continue Zetia 5.  Proximal atrial fibrillation: Continue warfarin, patient follows up  with Dr. Ubaldo Glassing.,  Rate controlled, continue metoprolol, Cardizem, Coumadin INR is therapeutic. 6 morbid obesity weight loss recommended.  7 UTI: Had dysuria, abnormal UA.  Follow urine cultures, started on Cipro.      Code Status Orders  (From admission, onward)        Start     Ordered   07/23/17 0620  Limited resuscitation (code)  Continuous    Question Answer Comment  In the event of cardiac or respiratory ARREST: Initiate Code Blue, Call Rapid Response Yes   In the event of cardiac or respiratory ARREST: Perform CPR No   In the event of cardiac or respiratory ARREST: Perform Intubation/Mechanical Ventilation Yes   In the event of cardiac or respiratory ARREST: Use NIPPV/BiPAp only if indicated Yes   In the event of cardiac or respiratory ARREST: Administer ACLS medications if indicated Yes   In the event of cardiac or respiratory ARREST: Perform Defibrillation or Cardioversion if indicated Yes      07/23/17 0619    Code Status History    Date Active Date Inactive Code Status Order ID Comments User Context   07/23/2017 05:39 07/23/2017 06:19 Full Code [id number]  Saundra Shelling, MD Inpatient   09/23/2016 19:28 09/26/2016 16:20 Partial Code [id number]  Bettey Costa, MD Inpatient   03/24/2016 15:11 04/01/2016 17:55 Full Code [id number]  Wilhelmina Mcardle, MD ED   12/12/2014 03:01 12/20/2014 19:58 Full Code [id number]  Lance Coon, MD Inpatient           Consults orthopedic surgery  DVT Prophylaxis Coumadin  Lab Results  Component Value Date   PLT 211 07/23/2017     Time Spent in minutes   45 minutes greater than 50% of time spent in care coordination and counseling patient regarding the condition and plan of care.   Epifanio Lesches M.D on 07/24/2017 at 8:06 AM  Between 7am to 6pm - Pager - [phone]  After 6pm go to [email or web address] - password [email or web address] [place] [facility] [place]   Office  [phone]

## 2017-07-24 NOTE — Consult Note (Signed)
ORTHOPAEDIC CONSULTATION  REQUESTING PHYSICIAN: Epifanio Lesches, MD  Chief Complaint: right knee pain  HPI: Laura Gonzalez is a 77 y.o. female who complains of right knee pain. She has had intermittent chronic, severe knee pain and swelling since her knee replacement in 2015 with Dr. Marry Guan. Patient has been evaluated in my office on multiple occasions for this issue. The pain this time was much more severe, requiring hospitalization to control the pain. She has no other complaints. Today she is feeling much better and states the swelling and redness are much better.  Past Medical History:  Diagnosis Date  . Arthritis    wrist and knees  . Asthma   . Cancer ([place])    skin cancer  . Carpal tunnel syndrome   . Chicken pox   . Degenerative arthritis of knee, bilateral   . Diverticulitis   . Diverticulosis   . Dysrhythmia    afib  . GERD (gastroesophageal reflux disease)   . Hernia, umbilical   . Hyperlipidemia   . Hypertension   . Numbness and tingling    arm to leg  . Osteoporosis   . Pneumonia   . Shortness of breath    only with astma attacks  . Sleep apnea    Past Surgical History:  Procedure Laterality Date  . ANTERIOR CERVICAL DECOMP/DISCECTOMY FUSION N/A 08/09/2012   Procedure: ANTERIOR CERVICAL DECOMPRESSION/DISCECTOMY FUSION 2 LEVELS;  Surgeon: [name], MD;  Location: [facility];  Service: Neurosurgery;  Laterality: N/A;  C3-4 C4-5 Anterior cervical decompression/diskectomy/fusion/[employer] Bone/Trestle plate  . BREAST BIOPSY Left 1984   EXCISIONAL - NEG  . BREAST BIOPSY Left 1987   EXCISIONAL - NEG  . BREAST SURGERY    . COLONOSCOPY    . COLONOSCOPY WITH PROPOFOL N/A 07/29/2015   Procedure: COLONOSCOPY WITH PROPOFOL;  Surgeon: [name], MD;  Location: [facility] ENDOSCOPY;  Service: Endoscopy;  Laterality: N/A;  . colonscopy  2000,2007,2012  . ESOPHAGOGASTRODUODENOSCOPY    . HERNIA REPAIR    . JOINT REPLACEMENT Right    Total Knee Replacement  .  LIPOMA EXCISION Right 2010   back  . MASTECTOMY PARTIAL / LUMPECTOMY Left 1980s  . ROTATOR CUFF REPAIR Bilateral right- 2009, left 2011  . SKIN CANCER EXCISION  2009   back of neck and right cheek  . TONSILLECTOMY  1960  . TRACHEOSTOMY    . UMBILICAL HERNIA REPAIR  [id number]  . VARICOSE VEIN SURGERY Left 04/2009 rt 2011  . [place]  . WRIST SURGERY Right 1998   external fixator   Social History   Socioeconomic History  . Marital status: Single    Spouse name: None  . Number of children: None  . Years of education: None  . Highest education level: None  Social Needs  . Financial resource strain: None  . Food insecurity - worry: None  . Food insecurity - inability: None  . Transportation needs - medical: None  . Transportation needs - non-medical: None  Occupational History  . Occupation: retired  Tobacco Use  . Smoking status: Never Smoker  . Smokeless tobacco: Never Used  Substance and Sexual Activity  . Alcohol use: No  . Drug use: No  . Sexual activity: Not Currently  Other Topics Concern  . None  Social History Narrative  . None   Family History  Problem Relation Age of Onset  . Pulmonary embolism Mother   . Arthritis Mother   . Hypertension Mother   .  Heart attack Mother   . Breast cancer Mother 75  . Stomach cancer Maternal Aunt   . Throat cancer Maternal Uncle   . Stomach cancer Maternal Grandfather    Allergies  Allergen Reactions  . Amoxicillin Other (See Comments)    Lips swelling, tingling  . Benzocaine-Menthol     Throat swelling  . Biafine [Wound Dressings] Swelling  . Chloraseptic Sore Throat [Acetaminophen] Other (See Comments)    throat swelling  . Fosamax [Alendronate Sodium]   . Neosporin [Neomycin-Bacitracin Zn-Polymyx] Other (See Comments)    Skin redness, puffy  . Other     "chlorotrimitron"-- rxn: throat swelling  . Oxycodone Other (See Comments)    Dizziness, lips tingling  . Statins Other (See Comments)     Muscle weakness, weak  . Tape Other (See Comments)    Skin redness   Prior to Admission medications   Medication Sig Start Date End Date Taking? Authorizing Provider  acetaminophen (TYLENOL) 325 MG tablet Take 650 mg by mouth every 6 (six) hours as needed for mild pain, moderate pain or headache.   Yes [provider]  acidophilus (RISAQUAD) CAPS capsule Take 1 capsule by mouth daily.   Yes [provider]  albuterol (PROVENTIL HFA;VENTOLIN HFA) 108 (90 BASE) MCG/ACT inhaler Inhale 2 puffs into the lungs every 6 (six) hours as needed. For shortness of breath   Yes [provider]  albuterol (PROVENTIL) (2.5 MG/3ML) 0.083% nebulizer solution Take 2.5 mg by nebulization every 6 (six) hours as needed for wheezing or shortness of breath.   Yes [provider]  alendronate (FOSAMAX) 70 MG tablet Take 70 mg by mouth once a week. Take with a full glass of water on an empty stomach. On Sundays   Yes [provider]  Aloe-Sodium Chloride (AYR SALINE NASAL GEL NA) Place 1 application into the nose as needed.   Yes [provider]  Alum Hydroxide-Mag Carbonate ([employer] HEARTBURN RELIEF PO) Take 1 tablet by mouth as needed.   Yes [provider]  aspirin EC 81 MG tablet Take 81 mg by mouth daily as needed.    Yes [provider]  Calcium Citrate (CITRACAL PO) Take 2 tablets by mouth daily.    Yes [provider]  Cholecalciferol (VITAMIN D3) 5000 UNITS CAPS Take 1 capsule by mouth daily.   Yes [provider]  diltiazem (CARDIZEM CD) 300 MG 24 hr capsule Take 300 mg by mouth daily.   Yes [provider]  diltiazem (CARDIZEM) 60 MG tablet Take 60 mg by mouth every 6 (six) hours as needed. Takes if has palpitation after taking diltiazem 300mg  dose    Yes [provider]  diphenhydrAMINE (BENADRYL) 25 MG tablet Take 25 mg by mouth every 6 (six) hours as needed.   Yes [provider]  ezetimibe  (ZETIA) 10 MG tablet Take 10 mg by mouth daily.    Yes [provider]  hydrochlorothiazide (HYDRODIURIL) 12.5 MG tablet Take 1 tablet (12.5 mg total) by mouth daily. 04/01/16  Yes Gladstone Lighter, MD  loratadine (CLARITIN) 10 MG tablet Take 10 mg by mouth daily.   Yes [provider]  magnesium oxide (MAG-OX) 400 MG tablet Take 800 mg by mouth daily.    Yes [provider]  MEGARED OMEGA-3 KRILL OIL 500 MG CAPS Take 1 capsule by mouth daily.   Yes [provider]  metoprolol tartrate (LOPRESSOR) 25 MG tablet Take 12.5 mg by mouth 2 (two) times daily.  Yes [provider]  montelukast (SINGULAIR) 10 MG tablet Take 10 mg by mouth at bedtime.   Yes [provider]  Multiple Vitamin (MULTIVITAMIN WITH MINERALS) TABS Take 2 tablets by mouth daily.    Yes [provider]  Multiple Vitamins-Minerals (PRESERVISION AREDS PO) Take 1 tablet by mouth daily.   Yes [provider]  potassium chloride SA (K-DUR,KLOR-CON) 20 MEQ tablet Take 1 tablet (20 mEq total) by mouth 2 (two) times daily. Patient taking differently: Take 20 mEq by mouth 3 (three) times daily.  04/01/16  Yes Gladstone Lighter, MD  trolamine salicylate (ASPERCREME) 10 % cream Apply 1 application topically as needed for muscle pain.   Yes [provider]  vitamin B-12 (CYANOCOBALAMIN) 1000 MCG tablet Take 3,000 mcg by mouth daily.   Yes [provider]  warfarin (COUMADIN) 2 MG tablet Take 1 tablet (2 mg total) by mouth daily. Patient taking differently: Take 4 mg by mouth daily.  12/20/14  Yes Fritzi Mandes, MD  azithromycin (ZITHROMAX) 500 MG tablet Take 1 tablet (500 mg total) by mouth daily. Patient not taking: Reported on 07/23/2017 09/26/16   Demetrios Loll, MD  bisacodyl (BISACODYL) 5 MG EC tablet Take 5 mg by mouth daily as needed for moderate constipation.    [provider]  dextromethorphan-guaiFENesin (MUCINEX DM) 30-600 MG 12hr tablet Take  1 tablet by mouth 2 (two) times daily as needed for cough. Patient not taking: Reported on 07/23/2017 09/26/16   Demetrios Loll, MD  fluticasone [facility]) 50 MCG/ACT nasal spray Place 1 spray into both nostrils daily.     [provider]  mometasone-formoterol (DULERA) 200-5 MCG/ACT AERO Inhale 2 puffs into the lungs 2 (two) times daily. Patient not taking: Reported on 09/23/2016 04/01/16   Gladstone Lighter, MD  oxyCODONE-acetaminophen (PERCOCET) 5-325 MG tablet Take 1 tablet by mouth every 4 (four) hours as needed for severe pain. 07/23/17   Gregor Hams, MD  predniSONE (DELTASONE) 10 MG tablet 40 mg po daily for 2 days, 20 mg po daily for 2 days, 10 mg po daily for 3 days. Patient not taking: Reported on 07/23/2017 09/26/16   Demetrios Loll, MD   Dg Knee 2 Views Right  Result Date: 07/23/2017 CLINICAL DATA:  Right knee pain. 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 No recent injury. EXAM: RIGHT KNEE - 1-2 VIEW COMPARISON:  12/04/2016 FINDINGS: Postoperative changes with right total knee arthroplasty including patellar femoral component. Components appear well seated. No evidence of acute fracture or dislocation. No focal bone lesion or bone destruction. Small right knee effusion. No radiopaque soft tissue foreign bodies. IMPRESSION: Right total knee arthroplasty. Components appear well seated. No acute fracture or dislocation. Small effusion. Electronically Signed   By: Lucienne Capers M.D.   On: 07/23/2017 01:37    Positive ROS: All other systems have been reviewed and were otherwise negative with the exception of those mentioned in the HPI and as above.  Physical Exam: General: Alert, no acute distress Cardiovascular: No pedal edema Respiratory: No cyanosis, no use of accessory musculature GI: No organomegaly, abdomen is soft and non-tender Skin: No lesions in the area of chief complaint Neurologic: Sensation intact distally Psychiatric: Patient is competent for consent with normal mood  and affect Lymphatic: No axillary or cervical lymphadenopathy  MUSCULOSKELETAL: LLE, BUE FROM and non-tender RLE: well healed scar, mild warmth, no erythema, minimal swelling, painless passive motion, motor and sensory intact distally  Assessment: Right knee pain  Plan: She has significantly improved.  I would not get an MRI at this time due to the amount of scatter from her knee replacement. I will follow her as an outpatient for work-up of mechanical loosening versus intermittent intra-articular bleeding. If she passes PT she may leave today and follow-up in my office Thursday 2/7. Please call with questions, thanks,    Lovell Sheehan, MD    07/24/2017 12:07 PM

## 2017-07-24 NOTE — Progress Notes (Signed)
Pharmacy Antibiotic Note  Laura Gonzalez is a 77 y.o. female admitted on 07/22/2017 with UTI.  Pharmacy has been consulted for ciprofloxacin dosing.  Plan: Ciprofloxacin 500mg  PO BID.  Height: 5\' 5"  (165.1 cm) Weight: 277 lb (125.6 kg) IBW/kg (Calculated) : 57  Temp (24hrs), Avg:98.1 F (36.7 C), Min:97.9 F (36.6 C), Max:98.2 F (36.8 C)  Recent Labs  Lab 07/23/17 0009 07/23/17 0703  WBC 9.3  --   CREATININE 0.75 0.77    Estimated Creatinine Clearance: 81 mL/min (by C-G formula based on SCr of 0.77 mg/dL).    Allergies  Allergen Reactions  . Amoxicillin Other (See Comments)    Lips swelling, tingling  . Benzocaine-Menthol     Throat swelling  . Biafine [Wound Dressings] Swelling  . Chloraseptic Sore Throat [Acetaminophen] Other (See Comments)    throat swelling  . Fosamax [Alendronate Sodium]   . Neosporin [Neomycin-Bacitracin Zn-Polymyx] Other (See Comments)    Skin redness, puffy  . Other     "chlorotrimitron"-- rxn: throat swelling  . Oxycodone Other (See Comments)    Dizziness, lips tingling  . Statins Other (See Comments)    Muscle weakness, weak  . Tape Other (See Comments)    Skin redness    Antimicrobials this admission: Cipro 2/2 >>    Dose adjustments this admission: N/A  Microbiology results: 2/1 UCx: pending   Thank you for allowing pharmacy to be a part of this patient's care.  Olivia Canter, [facility] 07/24/2017 8:56 AM

## 2017-07-25 DIAGNOSIS — J449 Chronic obstructive pulmonary disease, unspecified: Secondary | ICD-10-CM | POA: Diagnosis present

## 2017-07-25 DIAGNOSIS — Z7982 Long term (current) use of aspirin: Secondary | ICD-10-CM | POA: Diagnosis not present

## 2017-07-25 DIAGNOSIS — Z91048 Other nonmedicinal substance allergy status: Secondary | ICD-10-CM | POA: Diagnosis not present

## 2017-07-25 DIAGNOSIS — Z888 Allergy status to other drugs, medicaments and biological substances status: Secondary | ICD-10-CM | POA: Diagnosis not present

## 2017-07-25 DIAGNOSIS — G4730 Sleep apnea, unspecified: Secondary | ICD-10-CM | POA: Diagnosis present

## 2017-07-25 DIAGNOSIS — Z79899 Other long term (current) drug therapy: Secondary | ICD-10-CM | POA: Diagnosis not present

## 2017-07-25 DIAGNOSIS — K219 Gastro-esophageal reflux disease without esophagitis: Secondary | ICD-10-CM | POA: Diagnosis present

## 2017-07-25 DIAGNOSIS — Z7901 Long term (current) use of anticoagulants: Secondary | ICD-10-CM | POA: Diagnosis not present

## 2017-07-25 DIAGNOSIS — Z981 Arthrodesis status: Secondary | ICD-10-CM | POA: Diagnosis not present

## 2017-07-25 DIAGNOSIS — Z85828 Personal history of other malignant neoplasm of skin: Secondary | ICD-10-CM | POA: Diagnosis not present

## 2017-07-25 DIAGNOSIS — B9620 Unspecified Escherichia coli [E. coli] as the cause of diseases classified elsewhere: Secondary | ICD-10-CM | POA: Diagnosis present

## 2017-07-25 DIAGNOSIS — I480 Paroxysmal atrial fibrillation: Secondary | ICD-10-CM | POA: Diagnosis present

## 2017-07-25 DIAGNOSIS — T8484XA Pain due to internal orthopedic prosthetic devices, implants and grafts, initial encounter: Secondary | ICD-10-CM | POA: Diagnosis present

## 2017-07-25 DIAGNOSIS — I10 Essential (primary) hypertension: Secondary | ICD-10-CM | POA: Diagnosis present

## 2017-07-25 DIAGNOSIS — M810 Age-related osteoporosis without current pathological fracture: Secondary | ICD-10-CM | POA: Diagnosis present

## 2017-07-25 DIAGNOSIS — Z880 Allergy status to penicillin: Secondary | ICD-10-CM | POA: Diagnosis not present

## 2017-07-25 DIAGNOSIS — Z6841 Body Mass Index (BMI) 40.0 and over, adult: Secondary | ICD-10-CM | POA: Diagnosis not present

## 2017-07-25 DIAGNOSIS — N390 Urinary tract infection, site not specified: Secondary | ICD-10-CM | POA: Diagnosis present

## 2017-07-25 DIAGNOSIS — Z885 Allergy status to narcotic agent status: Secondary | ICD-10-CM | POA: Diagnosis not present

## 2017-07-25 DIAGNOSIS — E785 Hyperlipidemia, unspecified: Secondary | ICD-10-CM | POA: Diagnosis present

## 2017-07-25 DIAGNOSIS — M25561 Pain in right knee: Secondary | ICD-10-CM | POA: Diagnosis present

## 2017-07-25 DIAGNOSIS — I482 Chronic atrial fibrillation: Secondary | ICD-10-CM | POA: Diagnosis present

## 2017-07-25 DIAGNOSIS — R2681 Unsteadiness on feet: Secondary | ICD-10-CM | POA: Diagnosis present

## 2017-07-25 LAB — PROTIME-INR
INR: 2.37
PROTHROMBIN TIME: 25.7 s — AB (ref 11.4–15.2)

## 2017-07-25 MED ORDER — DEXTROSE 5 % IV SOLN
1.0000 g | INTRAVENOUS | Status: DC
  Administered 2017-07-25 – 2017-07-26 (×2): 1 g via INTRAVENOUS
  Filled 2017-07-25 (×2): qty 10

## 2017-07-25 NOTE — Progress Notes (Signed)
Trouble breathing 78 on room air for first time sound Physicians - [facility]                                                                                                                                                                                  Patient Demographics   Laura Gonzalez, is a 77 y.o. female, DOB - 1941/04/24, MGQ:[id number]  Admit date - 07/22/2017   Admitting Physician Saundra Shelling, MD  Outpatient Primary MD for the patient is [name], MD   LOS - 0  Subjective: Admitted for right knee pain, mobility difficulties.  Physical therapy recommended SNF placement.  Patient wants to go to [facility]. Review of Systems:   CONSTITUTIONAL: No documented fever. No fatigue, weakness. No weight gain, no weight loss.  EYES: No blurry or double vision.  ENT: No tinnitus. No postnasal drip. No redness of the oropharynx.  RESPIRATORY: No cough, no wheeze, no hemoptysis. No dyspnea.  CARDIOVASCULAR: No chest pain. No orthopnea. No palpitations. No syncope.  GASTROINTESTINAL: No nausea, no vomiting or diarrhea. No abdominal pain. No melena or hematochezia.  GENITOURINARY: No dysuria or hematuria.  ENDOCRINE: No polyuria or nocturia. No heat or cold intolerance.  HEMATOLOGY: No anemia. No bruising. No bleeding.  INTEGUMENTARY: No rashes. No lesions.  MUSCULOSKELETAL: Right knee pain and swelling, has immobilizer present for  right knee. NEUROLOGIC: No numbness, tingling, or ataxia. No seizure-type activity.  PSYCHIATRIC: No anxiety. No insomnia. No ADD.    Vitals:   Vitals:   07/24/17 1420 07/24/17 1911 07/25/17 0436 07/25/17 0815  BP: (!) 143/72 (!) 130/54 (!) 127/47 (!) 111/45  Pulse: 72 80 75 74  Resp: 18 19 19 18   Temp: 97.9 F (36.6 C) 98 F (36.7 C) 98 F (36.7 C) 97.9 F (36.6 C)  TempSrc: Oral Oral Oral Oral  SpO2: 94% 94% 92% 95%  Weight:      Height:        Wt Readings from Last 3 Encounters:  07/22/17 125.6 kg (277 lb)   12/04/16 124.7 kg (275 lb)  09/26/16 125.1 kg (275 lb 12.8 oz)     Intake/Output Summary (Last 24 hours) at 07/25/2017 1218 Last data filed at 07/25/2017 1109 Gross per 24 hour  Intake 720 ml  Output 350 ml  Net 370 ml    Physical Exam:   GENERAL: Pleasant-appearing in no apparent distress.  HEAD, EYES, EARS, NOSE AND THROAT: Atraumatic, normocephalic. Extraocular muscles are intact. Pupils equal and reactive to light. Sclerae anicteric. No conjunctival injection. No oro-pharyngeal erythema.  NECK: Supple. There is no jugular venous distention. No bruits, no lymphadenopathy, no thyromegaly.  HEART: Regular rate  and rhythm,. No murmurs, no rubs, no clicks.  LUNGS: Clear to auscultation bilaterally. No rales or rhonchi. No wheezes.  ABDOMEN: Soft, flat, nontender, nondistended. Has good bowel sounds. No hepatosplenomegaly appreciated.  EXTREMITIES: Immobilizer present for the right knee.  NEUROLOGIC: The patient is alert, awake, and oriented x3 with no focal motor or sensory deficits appreciated bilaterally.  SKIN: Moist and warm with no rashes appreciated.  Psych: Not anxious, depressed LN: No inguinal LN enlargement    Antibiotics   Anti-infectives (From admission, onward)   Start     Dose/Rate Route Frequency Ordered Stop   07/24/17 1000  ciprofloxacin (CIPRO) tablet 500 mg     500 mg Oral 2 times daily 07/24/17 0855        Medications   Scheduled Meds: . acidophilus  1 capsule Oral Daily  . aspirin EC  81 mg Oral Daily  . cholecalciferol  5,000 Units Oral Daily  . ciprofloxacin  500 mg Oral BID  . diltiazem  120 mg Oral Daily   And  . diltiazem  180 mg Oral Daily  . ezetimibe  10 mg Oral Daily  . fluticasone  1 spray Each Nare Daily  . hydrochlorothiazide  12.5 mg Oral Daily  . loratadine  10 mg Oral Daily  . magnesium oxide  400 mg Oral Daily  . metoprolol tartrate  12.5 mg Oral BID  . montelukast  10 mg Oral QHS  . multivitamin with minerals  2 tablet Oral  Daily  . sodium chloride flush  3 mL Intravenous Q12H  . vitamin B-12  3,000 mcg Oral Daily  . warfarin  4 mg Oral q1800  . Warfarin - Pharmacist Dosing Inpatient   Does not apply q1800   Continuous Infusions: . sodium chloride     PRN Meds:.sodium chloride, albuterol, albuterol, bisacodyl, guaiFENesin **AND** dextromethorphan, ondansetron **OR** ondansetron (ZOFRAN) IV, senna-docusate, sodium chloride flush, traMADol   Data Review:   Micro Results Recent Results (from the past 240 hour(s))  Urine Culture     Status: Abnormal (Preliminary result)   Collection Time: 07/23/17  1:00 AM  Result Value Ref Range Status   Specimen Description   Final    URINE, RANDOM Performed at [facility], [address] [place], [place] [address]    Special Requests   Final    NONE Performed at [facility], [address], [place], [place] [address]    Culture (A)  Final    >=100,000 COLONIES/mL ESCHERICHIA COLI 50,000 COLONIES/mL ENTEROCOCCUS FAECALIS SUSCEPTIBILITIES TO FOLLOW Performed at [facility], [place] [address], [place], [place] [address]    Report Status PENDING  Incomplete   Organism ID, Bacteria ESCHERICHIA COLI (A)  Final      Susceptibility   Escherichia coli - MIC*    AMPICILLIN 8 SENSITIVE Sensitive     CEFAZOLIN <=4 SENSITIVE Sensitive     CEFTRIAXONE <=1 SENSITIVE Sensitive     CIPROFLOXACIN >=4 RESISTANT Resistant     GENTAMICIN <=1 SENSITIVE Sensitive     IMIPENEM <=0.25 SENSITIVE Sensitive     NITROFURANTOIN <=16 SENSITIVE Sensitive     TRIMETH/SULFA >=320 RESISTANT Resistant     AMPICILLIN/SULBACTAM 4 SENSITIVE Sensitive     PIP/TAZO <=4 SENSITIVE Sensitive     Extended ESBL NEGATIVE Sensitive     * >=100,000 COLONIES/mL ESCHERICHIA COLI    Radiology Reports Dg Knee 2 Views Right  Result Date: 07/23/2017 CLINICAL DATA:  Right knee pain. 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 No recent injury. 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 Components appear well seated. No evidence of acute fracture or dislocation. No focal bone lesion or bone destruction. Small right knee effusion. No radiopaque soft tissue foreign bodies. IMPRESSION: Right total knee arthroplasty. Components appear well seated. No acute fracture or dislocation. Small effusion. Electronically Signed   By: Lucienne Capers M.D.   On: 07/23/2017 01:37     CBC Recent Labs  Lab 07/23/17 0009  WBC 9.3  HGB 14.6  HCT 44.6  PLT 211  MCV 91.9  MCH 30.1  MCHC 32.8  RDW 13.9    Chemistries  Recent Labs  Lab 07/23/17 0009 07/23/17 0703  NA 141 141  K 4.1 4.3  CL 104 104  CO2 26 27  GLUCOSE 122* 104*  BUN 20 17  CREATININE 0.75 0.77  CALCIUM 10.2 9.4  AST 35  --   ALT 27  --   ALKPHOS 78  --   BILITOT 0.4  --    ------------------------------------------------------------------------------------------------------------------ estimated creatinine clearance is 81 mL/min (by C-G formula based on SCr of 0.77 mg/dL). ------------------------------------------------------------------------------------------------------------------ No results for input(s): HGBA1C in the last 72 hours. ------------------------------------------------------------------------------------------------------------------ No results for input(s): CHOL, HDL, LDLCALC, TRIG, CHOLHDL, LDLDIRECT in the last 72 hours. ------------------------------------------------------------------------------------------------------------------ No results for input(s): TSH, T4TOTAL, T3FREE, THYROIDAB in the last 72 hours.  Invalid input(s): FREET3 ------------------------------------------------------------------------------------------------------------------ No results for input(s): VITAMINB12, FOLATE, FERRITIN, TIBC, IRON, RETICCTPCT in the last 72 hours.  Coagulation  profile Recent Labs  Lab 07/23/17 0703 07/24/17 0349 07/25/17 0410  INR 2.52 2.48 2.37    No results for input(s): DDIMER in the last 72 hours.  Cardiac Enzymes No results for input(s): CKMB, TROPONINI, MYOGLOBIN in the last 168 hours.  Invalid input(s): CK ------------------------------------------------------------------------------------------------------------------ Invalid input(s): [place]  Patient is a 77 year old admitted with severe right knee pain and gait dysfunction  1.  Right knee pain suspect due to mechanical reasons patient states that this is been a recurrent issue now getting worse .  Physical therapy recommended SNF placement.  MRI of the right knee is canceled by orthopedic doctor Dr. Marica Otter. .  Patient can be out of the bed to chair .   s.  Patient had right knee effusion for which she had arthrocentesis at [facility]/had a workup for the right knee pain as an outpatient as per the recommendation.  2.  Ambulatory dysfunction; PT commons SNF placement. 3.  Essential hypertension continue hydrochlorothiazide and metoprolol 4.  Hyperlipidemia continue Zetia 5.  Proximal atrial fibrillation: Continue warfarin, patient follows up with Dr. Ubaldo Glassing.,  Rate controlled, continue metoprolol, Cardizem, Coumadin INR is therapeutic. 6 morbid obesity weight loss recommended.  7 .  E. coli UTI resistant to Cipro, Bactrim, start Rocephin while in the hospital and discharge to rehab with Macrodantin.      Code Status Orders  (From admission, onward)        Start     Ordered   07/23/17 0620  Limited resuscitation (code)  Continuous    Question Answer Comment  In the event of cardiac or respiratory ARREST: Initiate Code Blue, Call Rapid Response Yes   In the event of cardiac or respiratory ARREST: Perform CPR No   In the event of cardiac or respiratory ARREST: Perform Intubation/Mechanical Ventilation Yes   In the event of cardiac or respiratory  ARREST: Use NIPPV/BiPAp  only if indicated Yes   In the event of cardiac or respiratory ARREST: Administer ACLS medications if indicated Yes   In the event of cardiac or respiratory ARREST: Perform Defibrillation or Cardioversion if indicated Yes      07/23/17 0619    Code Status History    Date Active Date Inactive Code Status Order ID Comments User Context   07/23/2017 05:39 07/23/2017 06:19 Full Code [id number]  Saundra Shelling, MD Inpatient   09/23/2016 19:28 09/26/2016 16:20 Partial Code [id number]  Bettey Costa, MD Inpatient   03/24/2016 15:11 04/01/2016 17:55 Full Code [id number]  Wilhelmina Mcardle, MD ED   12/12/2014 03:01 12/20/2014 19:58 Full Code [id number]  Lance Coon, MD Inpatient           Consults orthopedic surgery  DVT Prophylaxis Coumadin  Lab Results  Component Value Date   PLT 211 07/23/2017     Time Spent in minutes   45 minutes greater than 50% of time spent in care coordination and counseling patient regarding the condition and plan of care.   Epifanio Lesches M.D on 07/25/2017 at 12:18 PM  Between 7am to 6pm - Pager - [phone]  After 6pm go to [email or web address] - password [email or web address] [place] [facility]   Office  [phone]

## 2017-07-25 NOTE — Progress Notes (Signed)
  Subjective:  Patient reports pain as mild in the right knee. The knee immobilizer is uncomfortable.  Objective:   VITALS:   Vitals:   07/24/17 1420 07/24/17 1911 07/25/17 0436 07/25/17 0815  BP: (!) 143/72 (!) 130/54 (!) 127/47 (!) 111/45  Pulse: 72 80 75 74  Resp: 18 19 19 18   Temp: 97.9 F (36.6 C) 98 F (36.7 C) 98 F (36.7 C) 97.9 F (36.6 C)  TempSrc: Oral Oral Oral Oral  SpO2: 94% 94% 92% 95%  Weight:      Height:        PHYSICAL EXAM:  Sensation intact distally Dorsiflexion/Plantar flexion intact Compartment soft  No erythema or warmth  LABS  Results for orders placed or performed during the hospital encounter of 07/22/17 (from the past 24 hour(s))  Protime-INR     Status: Abnormal   Collection Time: 07/25/17  4:10 AM  Result Value Ref Range   Prothrombin Time 25.7 (H) 11.4 - 15.2 seconds   INR 2.37     No results found.  Assessment/Plan:     Active Problems:   Gait instability   Right knee pain   Up with therapy Discharge to SNF  She will follow-up with me as an outpatient.   Lovell Sheehan , MD 07/25/2017, 12:58 PM

## 2017-07-25 NOTE — Progress Notes (Signed)
ANTICOAGULATION CONSULT NOTE - Follow up [facility] for warfarin Indication: atrial fibrillation  Allergies  Allergen Reactions  . Amoxicillin Other (See Comments)    Lips swelling, tingling  . Benzocaine-Menthol     Throat swelling  . Biafine [Wound Dressings] Swelling  . Chloraseptic Sore Throat [Acetaminophen] Other (See Comments)    throat swelling  . Fosamax [Alendronate Sodium]   . Neosporin [Neomycin-Bacitracin Zn-Polymyx] Other (See Comments)    Skin redness, puffy  . Other     "chlorotrimitron"-- rxn: throat swelling  . Oxycodone Other (See Comments)    Dizziness, lips tingling  . Statins Other (See Comments)    Muscle weakness, weak  . Tape Other (See Comments)    Skin redness    Patient Measurements: Height: 5\' 5"  (165.1 cm) Weight: 277 lb (125.6 kg) IBW/kg (Calculated) : 57  Vital Signs: Temp: 98 F (36.7 C) (02/03 0436) Temp Source: Oral (02/03 0436) BP: 127/47 (02/03 0436) Pulse Rate: 75 (02/03 0436)  Labs: Recent Labs    07/23/17 0009 07/23/17 0703 07/24/17 0349 07/25/17 0410  HGB 14.6  --   --   --   HCT 44.6  --   --   --   PLT 211  --   --   --   APTT  --  47*  --   --   LABPROT  --  27.0* 26.6* 25.7*  INR  --  2.52 2.48 2.37  CREATININE 0.75 0.77  --   --     Estimated Creatinine Clearance: 81 mL/min (by C-G formula based on SCr of 0.77 mg/dL).  Assessment: Pharmacy consulted to dose and monitor warfarin in this 77 year old female who was taking warfarin prior to admission.   Patient reports taking warfarin 4 mg PO daily and reports taking dose on 1/31 prior to admission.   Dosing History Date INR Dose 2/1 2.5        4 mg 2/2       2.48      4 mg 2/3       2.37  Goal of Therapy:  INR 2-3   Plan:  Continue home dose of warfarin 4 mg PO daily. INR to be rechecked with AM labs tomorrow.  Olivia Canter, [facility] Clinical Pharmacist 07/25/2017,9:07 AM

## 2017-07-25 NOTE — NC FL2 (Signed)
[place] LEVEL OF CARE SCREENING TOOL     IDENTIFICATION  Patient Name: Laura Gonzalez Birthdate: March 20, 1941 Sex: female Admission Date (Current Location): 07/22/2017  [place] and [place] Number:  Engineering geologist and Address:  [facility], [address], [place], [place] [address]      Provider Number: [id number]  Attending Physician Name and Address:  Epifanio Lesches, MD  Relative Name and Phone Number:       Current Level of Care: Hospital Recommended Level of Care: [place] Prior Approval Number:    Date Approved/Denied:   PASRR Number: [id number]  Discharge Plan: SNF    Current Diagnoses: Patient Active Problem List   Diagnosis Date Noted  . Right knee pain 07/25/2017  . Gait instability 07/23/2017  . Hypotension 03/24/2016  . Diverticulitis 12/11/2014  . Sepsis ([place]) 12/11/2014  . Persistent atrial fibrillation ([place]) 12/11/2014  . HTN (hypertension) 12/11/2014  . GERD (gastroesophageal reflux disease) 12/11/2014  . Asthma 12/11/2014    Orientation RESPIRATION BLADDER Height & Weight     Self, Time, Place  Normal Continent Weight: 277 lb (125.6 kg) Height:  5\' 5"  (165.1 cm)  BEHAVIORAL SYMPTOMS/MOOD NEUROLOGICAL BOWEL NUTRITION STATUS      Continent Diet(Heart healthy)  AMBULATORY STATUS COMMUNICATION OF NEEDS Skin   Extensive Assist Verbally Normal                       Personal Care Assistance Level of Assistance  Bathing, Feeding, Dressing Bathing Assistance: Limited assistance Feeding assistance: Independent Dressing Assistance: Limited assistance     Functional Limitations Info             SPECIAL CARE FACTORS FREQUENCY  PT (By licensed PT)     PT Frequency: Up to 5X per day, 5 days per week              Contractures Contractures Info: Not present    Additional Factors Info  Code Status, Allergies Code Status Info: Partial (Do not perform CPR in event of  cardiac or respiratory arrest. Otherwise, perform all aggressive measures). Allergies Info: Amoxicillin, Benzocaine-menthol, Biafine Wound Dressings, Chloraseptic Sore Throat Acetaminophen, Fosamax Alendronate Sodium, Neosporin Neomycin-bacitracin Zn-polymyx, Other, Oxycodone, Statins, Tape           Current Medications (07/25/2017):  This is the current hospital active medication list Current Facility-Administered Medications  Medication Dose Route Frequency Provider Last Rate Last Dose  . 0.9 %  sodium chloride infusion  250 mL Intravenous PRN Saundra Shelling, MD      . acidophilus (RISAQUAD) capsule 1 capsule  1 capsule Oral Daily Pyreddy, Reatha Harps, MD   1 capsule at 07/25/17 0851  . albuterol (PROVENTIL) (2.5 MG/3ML) 0.083% nebulizer solution 2.5 mg  2.5 mg Nebulization Q6H PRN Pyreddy, Pavan, MD      . albuterol (PROVENTIL) (2.5 MG/3ML) 0.083% nebulizer solution 2.5 mg  2.5 mg Inhalation Q4H PRN Pyreddy, Reatha Harps, MD      . aspirin EC tablet 81 mg  81 mg Oral Daily Pyreddy, Reatha Harps, MD   81 mg at 07/25/17 0849  . bisacodyl (DULCOLAX) EC tablet 5 mg  5 mg Oral Daily PRN Pyreddy, Reatha Harps, MD      . cefTRIAXone (ROCEPHIN) 1 g in dextrose 5 % 50 mL IVPB  1 g Intravenous Q24H Epifanio Lesches, MD 100 mL/hr at 07/25/17 1318 1 g at 07/25/17 1318  . cholecalciferol (VITAMIN D) tablet 5,000 Units  5,000 Units Oral Daily Pyreddy,  Reatha Harps, MD   5,000 Units at 07/25/17 0850  . guaiFENesin (MUCINEX) 12 hr tablet 600 mg  600 mg Oral BID PRN Saundra Shelling, MD   600 mg at 07/24/17 2106   And  . dextromethorphan (DELSYM) 30 MG/5ML liquid 30 mg  30 mg Oral BID PRN Saundra Shelling, MD      . diltiazem (CARDIZEM CD) 24 hr capsule 120 mg  120 mg Oral Daily Dustin Flock, MD   120 mg at 07/25/17 7989   And  . diltiazem (CARDIZEM CD) 24 hr capsule 180 mg  180 mg Oral Daily Dustin Flock, MD   180 mg at 07/25/17 0852  . ezetimibe (ZETIA) tablet 10 mg  10 mg Oral Daily Pyreddy, Reatha Harps, MD   10 mg at 07/25/17 1229  .  fluticasone (FLONASE) 50 MCG/ACT nasal spray 1 spray  1 spray Each Nare Daily Pyreddy, Pavan, MD   1 spray at 07/23/17 1040  . hydrochlorothiazide (HYDRODIURIL) tablet 12.5 mg  12.5 mg Oral Daily Pyreddy, Reatha Harps, MD   12.5 mg at 07/24/17 0844  . loratadine (CLARITIN) tablet 10 mg  10 mg Oral Daily Pyreddy, Reatha Harps, MD   10 mg at 07/25/17 0850  . magnesium oxide (MAG-OX) tablet 400 mg  400 mg Oral Daily Pyreddy, Reatha Harps, MD   400 mg at 07/25/17 0850  . metoprolol tartrate (LOPRESSOR) tablet 12.5 mg  12.5 mg Oral BID Saundra Shelling, MD   12.5 mg at 07/25/17 0852  . montelukast (SINGULAIR) tablet 10 mg  10 mg Oral QHS Saundra Shelling, MD   10 mg at 07/24/17 2106  . multivitamin with minerals tablet 2 tablet  2 tablet Oral Daily Saundra Shelling, MD   2 tablet at 07/25/17 0850  . ondansetron (ZOFRAN) tablet 4 mg  4 mg Oral Q6H PRN Saundra Shelling, MD       Or  . ondansetron (ZOFRAN) injection 4 mg  4 mg Intravenous Q6H PRN Pyreddy, Pavan, MD      . senna-docusate (Senokot-S) tablet 1 tablet  1 tablet Oral QHS PRN Pyreddy, Reatha Harps, MD      . sodium chloride flush (NS) 0.9 % injection 3 mL  3 mL Intravenous Q12H Pyreddy, Reatha Harps, MD   3 mL at 07/25/17 1319  . sodium chloride flush (NS) 0.9 % injection 3 mL  3 mL Intravenous PRN Pyreddy, Reatha Harps, MD      . traMADol (ULTRAM) tablet 50 mg  50 mg Oral Q6H PRN Pyreddy, Reatha Harps, MD      . vitamin B-12 (CYANOCOBALAMIN) tablet 3,000 mcg  3,000 mcg Oral Daily Pyreddy, Reatha Harps, MD   3,000 mcg at 07/25/17 0850  . warfarin (COUMADIN) tablet 4 mg  4 mg Oral q1800 Pyreddy, Reatha Harps, MD   4 mg at 07/24/17 1710  . Warfarin - Pharmacist Dosing Inpatient   Does not apply q1800 Lenis Noon, [facility]         Discharge Medications: Please see discharge summary for a list of discharge medications.  Relevant Imaging Results:  Relevant Lab Results:   Additional Information SS#[id number]  Zettie Pho, LCSW

## 2017-07-25 NOTE — Clinical Social Work Note (Signed)
Clinical Social Work Assessment  Patient Details  Name: Laura Gonzalez MRN: [id number] Date of Birth: June 14, 1941  Date of referral:  07/25/17               Reason for consult:  Facility Placement                Permission sought to share information with:  Facility Art therapist granted to share information::  Yes, Verbal Permission Granted  Name::        Agency::     Relationship::     Contact Information:     Housing/Transportation Living arrangements for the past 2 months:  [place] of Information:  Patient, Medical Team, Facility Patient Interpreter Needed:  None Criminal Activity/Legal Involvement Pertinent to Current Situation/Hospitalization:  No - Comment as needed Significant Relationships:  Neighbor, [employer], [employer] Lives with:  Self Do you feel safe going back to the place where you live?  Yes Need for family participation in patient care:  No (Coment)  Care giving concerns: PT recommendation for STR   Social Worker assessment / plan:  The CSW met with the patient at bedside to discuss discharge planning. At the time of assessment, the patient was under observation status. CSW explained that Medicare will not cover SNF due to observation status; however, the patient could pay privately. The patient reported that she would like to think about the cost option vs. Home with home health.   Laura Gonzalez at [facility] reported that the patient has a bed should she choose as she is an independent living resident at the [facility] with no family support. However, under observation status, the patient would have to pay privately as she has a contract that does not include respite or light service options. The CSW explained this information to the patient who verbalized understanding.  At this time, the patient has been transferred to inpatient status due to IV ABX needs due to a medication resistant UTI. CSW has  updated Arts development officer at [facility]. The current plan for discharge is Wednesday if the patient requires that time for ABX. If not, the patient will become observation status once again. The patient is aware and wants to pursue SNF.  Employment status:  Retired Forensic scientist:  [employer] PT Recommendations:  Laura Gonzalez / Referral to community resources:  [place]  Patient/Family's Response to care:  The patient thanked the CSW.  Patient/Family's Understanding of and Emotional Response to Diagnosis, Current Treatment, and Prognosis:  The patient is struggling with understanding how observation status vs. Inpatient status works within [employer] system. The patient is frustrated with the insurance barrier.   Emotional Assessment Appearance:  Appears stated age Attitude/Demeanor/Rapport:  Reactive, Engaged Affect (typically observed):  Frustrated Orientation:  Oriented to Self, Oriented to Place, Oriented to  Time, Oriented to Situation Alcohol / Substance use:  Never Used Psych involvement (Current and /or in the community):  No (Comment)  Discharge Needs  Concerns to be addressed:  Care Coordination, Discharge Planning Concerns Readmission within the last 30 days:  No Current discharge risk:  Chronically ill Barriers to Discharge:  Continued Medical Work up   [employer], LCSW 07/25/2017, 2:07 PM

## 2017-07-25 NOTE — Clinical Social Work Placement (Signed)
   CLINICAL SOCIAL WORK PLACEMENT  NOTE  Date:  07/25/2017  Patient Details  Name: Laura Gonzalez MRN: [id number] Date of Birth: 04/05/1941  Clinical Social Work is seeking post-discharge placement for this patient at the [place] level of care (*CSW will initial, date and re-position this form in  chart as items are completed):  Yes   Patient/family provided with [facility] Work Department's list of facilities offering this level of care within the geographic area requested by the patient (or if unable, by the patient's family).  Yes   Patient/family informed of their freedom to choose among providers that offer the needed level of care, that participate in Medicare, Medicaid or managed care program needed by the patient, have an available bed and are willing to accept the patient.  Yes   Patient/family informed of [facility]'s ownership interest in [facility] and [facility], as well as of the fact that they are under no obligation to receive care at these facilities.  PASRR submitted to EDS on       PASRR number received on       Existing PASRR number confirmed on 07/25/17     FL2 transmitted to all facilities in geographic area requested by pt/family on 07/25/17     FL2 transmitted to all facilities within larger geographic area on       Patient informed that his/her managed care company has contracts with or will negotiate with certain facilities, including the following:        Yes   Patient/family informed of bed offers received.  Patient chooses bed at [facility]     Physician recommends and patient chooses bed at [facility]    Patient to be transferred to   on  .  Patient to be transferred to facility by       Patient family notified on   of transfer.  Name of family member notified:        PHYSICIAN       Additional Comment:    _______________________________________________ Zettie Pho, LCSW 07/25/2017,  2:12 PM

## 2017-07-25 NOTE — Plan of Care (Signed)
  Progressing Health Behavior/Discharge Planning: Ability to manage health-related needs will improve 07/25/2017 1948 - Progressing by Marylouise Stacks, RN Clinical Measurements: Ability to maintain clinical measurements within normal limits will improve 07/25/2017 1948 - Progressing by Marylouise Stacks, RN Will remain free from infection 07/25/2017 1948 - Progressing by Marylouise Stacks, RN Diagnostic test results will improve 07/25/2017 1948 - Progressing by Marylouise Stacks, RN Cardiovascular complication will be avoided 07/25/2017 1948 - Progressing by Marylouise Stacks, RN Pain Managment: General experience of comfort will improve 07/25/2017 1948 - Progressing by Marylouise Stacks, RN Safety: Ability to remain free from injury will improve 07/25/2017 1948 - Progressing by Marylouise Stacks, RN

## 2017-07-25 NOTE — Progress Notes (Signed)
ANTIBIOTIC CONSULT NOTE - INITIAL  Pharmacy Consult for ceftriaxone Indication: UTI  Allergies  Allergen Reactions  . Amoxicillin Other (See Comments)    Lips swelling, tingling  . Benzocaine-Menthol     Throat swelling  . Biafine [Wound Dressings] Swelling  . Chloraseptic Sore Throat [Acetaminophen] Other (See Comments)    throat swelling  . Fosamax [Alendronate Sodium]   . Neosporin [Neomycin-Bacitracin Zn-Polymyx] Other (See Comments)    Skin redness, puffy  . Other     "chlorotrimitron"-- rxn: throat swelling  . Oxycodone Other (See Comments)    Dizziness, lips tingling  . Statins Other (See Comments)    Muscle weakness, weak  . Tape Other (See Comments)    Skin redness    Patient Measurements: Height: 5\' 5"  (165.1 cm) Weight: 277 lb (125.6 kg) IBW/kg (Calculated) : 57 Adjusted Body Weight:   Vital Signs: Temp: 97.9 F (36.6 C) (02/03 0815) Temp Source: Oral (02/03 0815) BP: 111/45 (02/03 0815) Pulse Rate: 74 (02/03 0815) Intake/Output from previous day: 02/02 0701 - 02/03 0700 In: 720 [P.O.:720] Out: 300 [Urine:300] Intake/Output from this shift: Total I/O In: 240 [P.O.:240] Out: 350 [Urine:350]  Labs: Recent Labs    07/23/17 0009 07/23/17 0703  WBC 9.3  --   HGB 14.6  --   PLT 211  --   CREATININE 0.75 0.77   Estimated Creatinine Clearance: 81 mL/min (by C-G formula based on SCr of 0.77 mg/dL). No results for input(s): VANCOTROUGH, VANCOPEAK, VANCORANDOM, GENTTROUGH, GENTPEAK, GENTRANDOM, TOBRATROUGH, TOBRAPEAK, TOBRARND, AMIKACINPEAK, AMIKACINTROU, AMIKACIN in the last 72 hours.   Microbiology: Recent Results (from the past 720 hour(s))  Urine Culture     Status: Abnormal (Preliminary result)   Collection Time: 07/23/17  1:00 AM  Result Value Ref Range Status   Specimen Description   Final    URINE, RANDOM Performed at [facility], [address], [place], [place] [address]    Special Requests   Final    NONE Performed at  [facility], [address] [place], [place] [address]    Culture (A)  Final    >=100,000 COLONIES/mL ESCHERICHIA COLI 50,000 COLONIES/mL ENTEROCOCCUS FAECALIS SUSCEPTIBILITIES TO FOLLOW Performed at [facility], [place] [address], [place], [place] [address]    Report Status PENDING  Incomplete   Organism ID, Bacteria ESCHERICHIA COLI (A)  Final      Susceptibility   Escherichia coli - MIC*    AMPICILLIN 8 SENSITIVE Sensitive     CEFAZOLIN <=4 SENSITIVE Sensitive     CEFTRIAXONE <=1 SENSITIVE Sensitive     CIPROFLOXACIN >=4 RESISTANT Resistant     GENTAMICIN <=1 SENSITIVE Sensitive     IMIPENEM <=0.25 SENSITIVE Sensitive     NITROFURANTOIN <=16 SENSITIVE Sensitive     TRIMETH/SULFA >=320 RESISTANT Resistant     AMPICILLIN/SULBACTAM 4 SENSITIVE Sensitive     PIP/TAZO <=4 SENSITIVE Sensitive     Extended ESBL NEGATIVE Sensitive     * >=100,000 COLONIES/mL ESCHERICHIA COLI    Medical History: Past Medical History:  Diagnosis Date  . Arthritis    wrist and knees  . Asthma   . Cancer ([place])    skin cancer  . Carpal tunnel syndrome   . Chicken pox   . Degenerative arthritis of knee, bilateral   . Diverticulitis   . Diverticulosis   . Dysrhythmia    afib  . GERD (gastroesophageal reflux disease)   . Hernia, umbilical   . Hyperlipidemia   . Hypertension   .  Numbness and tingling    arm to leg  . Osteoporosis   . Pneumonia   . Shortness of breath    only with astma attacks  . Sleep apnea     Medications:  Infusions:  . sodium chloride    . cefTRIAXone (ROCEPHIN)  IV     Assessment: 35 yof with severe right knee pain and gait dysfunction and noted E Coli UTI. Was on oral cipro but sensitivities return with resistance to cipro and bactrim. Pharmacy consulted to dose Rocephin for UTI. Patient has allergy to PCN drugs that caused lip tingle but she has tolerated rocephin multiple times in the recent past. Will proceed with Rocephin.  Goal of Therapy:   Resolve infection Prevent ADE  Plan:  Rocephin 1 gm IV Q24H  Laural Benes, Pharm.D., BCPS Clinical Pharmacist 07/25/2017,12:24 PM

## 2017-07-26 LAB — URINE CULTURE

## 2017-07-26 LAB — PROTIME-INR
INR: 2.32
Prothrombin Time: 25.3 seconds — ABNORMAL HIGH (ref 11.4–15.2)

## 2017-07-26 MED ORDER — NITROFURANTOIN MONOHYD MACRO 100 MG PO CAPS
100.0000 mg | ORAL_CAPSULE | Freq: Two times a day (BID) | ORAL | Status: DC
  Administered 2017-07-26 – 2017-07-28 (×5): 100 mg via ORAL
  Filled 2017-07-26 (×6): qty 1

## 2017-07-26 MED ORDER — OXYCODONE-ACETAMINOPHEN 5-325 MG PO TABS: 1.0000 | ORAL_TABLET | ORAL | 0 refills | Status: DC | PRN

## 2017-07-26 MED ORDER — NITROFURANTOIN MACROCRYSTAL 100 MG PO CAPS: 100.0000 mg | ORAL_CAPSULE | Freq: Two times a day (BID) | ORAL | 0 refills | Status: AC

## 2017-07-26 NOTE — Progress Notes (Signed)
Plan is for patient to D/C to [facility] Wednesday 07/28/17 pending medical clearance. [facility] admissions coordinator at [facility] is aware of above. Patient is aware of above.  [employer], LCSW [phone]

## 2017-07-26 NOTE — Progress Notes (Signed)
Patient is stable this shift,denies pain,PT in progress and recommend SNF,continues on iv antibiotics,out of bed to chair.

## 2017-07-26 NOTE — Progress Notes (Signed)
Trouble breathing 78 on room air for first time sound Physicians - [place] at [facility]                                                                                                                                                                                  Patient Demographics   Laura Gonzalez, is a 77 y.o. female, DOB - [birth year], SHF:[id number]  Admit date - 07/22/2017   Admitting Physician Saundra Shelling, MD  Outpatient Primary MD for the patient is [name], MD   LOS - 1  Subjective: Admitted for right knee pain, mobility difficulties.  Physical therapy recommended SNF placement.  Patient wants to go to [facility]. Review of Systems:   CONSTITUTIONAL: No documented fever. No fatigue, weakness. No weight gain, no weight loss.  EYES: No blurry or double vision.  ENT: No tinnitus. No postnasal drip. No redness of the oropharynx.  RESPIRATORY: No cough, no wheeze, no hemoptysis. No dyspnea.  CARDIOVASCULAR: No chest pain. No orthopnea. No palpitations. No syncope.  GASTROINTESTINAL: No nausea, no vomiting or diarrhea. No abdominal pain. No melena or hematochezia.  GENITOURINARY: No dysuria or hematuria.  ENDOCRINE: No polyuria or nocturia. No heat or cold intolerance.  HEMATOLOGY: No anemia. No bruising. No bleeding.  INTEGUMENTARY: No rashes. No lesions.  MUSCULOSKELETAL: Right knee pain and swelling, has immobilizer present for  right knee. NEUROLOGIC: No numbness, tingling, or ataxia. No seizure-type activity.  PSYCHIATRIC: No anxiety. No insomnia. No ADD.    Vitals:   Vitals:   07/25/17 1403 07/25/17 2033 07/26/17 0453 07/26/17 0831  BP: 124/62 (!) 115/46 (!) 113/39 (!) 126/49  Pulse: 76 86 76 77  Resp: 18     Temp: 97.7 F (36.5 C) 98.3 F (36.8 C) 98.4 F (36.9 C) 98.2 F (36.8 C)  TempSrc: Oral Oral Oral Oral  SpO2: 96% 94% 93% 94%  Weight:      Height:        Wt Readings from Last 3 Encounters:  07/22/17 125.6 kg (277 lb)  12/04/16  124.7 kg (275 lb)  09/26/16 125.1 kg (275 lb 12.8 oz)     Intake/Output Summary (Last 24 hours) at 07/26/2017 1113 Last data filed at 07/26/2017 1055 Gross per 24 hour  Intake 890 ml  Output 300 ml  Net 590 ml    Physical Exam:   GENERAL: Pleasant-appearing in no apparent distress.  HEAD, EYES, EARS, NOSE AND THROAT: Atraumatic, normocephalic. Extraocular muscles are intact. Pupils equal and reactive to light. Sclerae anicteric. No conjunctival injection. No oro-pharyngeal erythema.  NECK: Supple. There is no jugular venous distention. No bruits, no lymphadenopathy, no thyromegaly.  HEART: Regular rate and  rhythm,. No murmurs, no rubs, no clicks.  LUNGS: Clear to auscultation bilaterally. No rales or rhonchi. No wheezes.  ABDOMEN: Soft, flat, nontender, nondistended. Has good bowel sounds. No hepatosplenomegaly appreciated.  EXTREMITIES: Immobilizer present for the right knee.  NEUROLOGIC: The patient is alert, awake, and oriented x3 with no focal motor or sensory deficits appreciated bilaterally.  SKIN: Moist and warm with no rashes appreciated.  Psych: Not anxious, depressed LN: No inguinal LN enlargement    Antibiotics   Anti-infectives (From admission, onward)   Start     Dose/Rate Route Frequency Ordered Stop   07/25/17 1230  cefTRIAXone (ROCEPHIN) 1 g in dextrose 5 % 50 mL IVPB     1 g 100 mL/hr over 30 Minutes Intravenous Every 24 hours 07/25/17 1224     07/24/17 1000  ciprofloxacin (CIPRO) tablet 500 mg  Status:  Discontinued     500 mg Oral 2 times daily 07/24/17 0855 07/25/17 1222      Medications   Scheduled Meds: . acidophilus  1 capsule Oral Daily  . aspirin EC  81 mg Oral Daily  . cholecalciferol  5,000 Units Oral Daily  . diltiazem  120 mg Oral Daily   And  . diltiazem  180 mg Oral Daily  . ezetimibe  10 mg Oral Daily  . fluticasone  1 spray Each Nare Daily  . hydrochlorothiazide  12.5 mg Oral Daily  . loratadine  10 mg Oral Daily  . magnesium oxide   400 mg Oral Daily  . metoprolol tartrate  12.5 mg Oral BID  . montelukast  10 mg Oral QHS  . multivitamin with minerals  2 tablet Oral Daily  . sodium chloride flush  3 mL Intravenous Q12H  . vitamin B-12  3,000 mcg Oral Daily  . warfarin  4 mg Oral q1800  . Warfarin - Pharmacist Dosing Inpatient   Does not apply q1800   Continuous Infusions: . sodium chloride    . cefTRIAXone (ROCEPHIN)  IV Stopped (07/25/17 1348)   PRN Meds:.sodium chloride, albuterol, albuterol, bisacodyl, guaiFENesin **AND** dextromethorphan, ondansetron **OR** ondansetron (ZOFRAN) IV, senna-docusate, sodium chloride flush, traMADol   Data Review:   Micro Results Recent Results (from the past 240 hour(s))  Urine Culture     Status: Abnormal   Collection Time: 07/23/17  1:00 AM  Result Value Ref Range Status   Specimen Description   Final    URINE, RANDOM Performed at [facility], [address] [place], [place] [address]    Special Requests   Final    NONE Performed at [facility], [address] [place], [place] [address]    Culture (A)  Final    >=100,000 COLONIES/mL ESCHERICHIA COLI 50,000 COLONIES/mL ENTEROCOCCUS FAECALIS    Report Status 07/26/2017 FINAL  Final   Organism ID, Bacteria ESCHERICHIA COLI (A)  Final   Organism ID, Bacteria ENTEROCOCCUS FAECALIS (A)  Final      Susceptibility   Escherichia coli - MIC*    AMPICILLIN 8 SENSITIVE Sensitive     CEFAZOLIN <=4 SENSITIVE Sensitive     CEFTRIAXONE <=1 SENSITIVE Sensitive     CIPROFLOXACIN >=4 RESISTANT Resistant     GENTAMICIN <=1 SENSITIVE Sensitive     IMIPENEM <=0.25 SENSITIVE Sensitive     NITROFURANTOIN <=16 SENSITIVE Sensitive     TRIMETH/SULFA >=320 RESISTANT Resistant     AMPICILLIN/SULBACTAM 4 SENSITIVE Sensitive     PIP/TAZO <=4 SENSITIVE Sensitive     Extended ESBL NEGATIVE Sensitive     * >=  100,000 COLONIES/mL ESCHERICHIA COLI   Enterococcus faecalis - MIC*    AMPICILLIN <=2 SENSITIVE Sensitive      LEVOFLOXACIN 1 SENSITIVE Sensitive     NITROFURANTOIN <=16 SENSITIVE Sensitive     VANCOMYCIN 1 SENSITIVE Sensitive     * 50,000 COLONIES/mL ENTEROCOCCUS FAECALIS    Radiology Reports Dg Knee 2 Views Right  Result Date: 07/23/2017 CLINICAL DATA:  Right knee pain. 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 No recent injury. EXAM: RIGHT KNEE - 1-2 VIEW COMPARISON:  12/04/2016 FINDINGS: Postoperative changes with right total knee arthroplasty including patellar femoral component. Components appear well seated. No evidence of acute fracture or dislocation. No focal bone lesion or bone destruction. Small right knee effusion. No radiopaque soft tissue foreign bodies. IMPRESSION: Right total knee arthroplasty. Components appear well seated. No acute fracture or dislocation. Small effusion. Electronically Signed   By: Lucienne Capers M.D.   On: 07/23/2017 01:37     CBC Recent Labs  Lab 07/23/17 0009  WBC 9.3  HGB 14.6  HCT 44.6  PLT 211  MCV 91.9  MCH 30.1  MCHC 32.8  RDW 13.9    Chemistries  Recent Labs  Lab 07/23/17 0009 07/23/17 0703  NA 141 141  K 4.1 4.3  CL 104 104  CO2 26 27  GLUCOSE 122* 104*  BUN 20 17  CREATININE 0.75 0.77  CALCIUM 10.2 9.4  AST 35  --   ALT 27  --   ALKPHOS 78  --   BILITOT 0.4  --    ------------------------------------------------------------------------------------------------------------------ estimated creatinine clearance is 81 mL/min (by C-G formula based on SCr of 0.77 mg/dL). ------------------------------------------------------------------------------------------------------------------ No results for input(s): HGBA1C in the last 72 hours. ------------------------------------------------------------------------------------------------------------------ No results for input(s): CHOL, HDL, LDLCALC, TRIG, CHOLHDL, LDLDIRECT in the last 72  hours. ------------------------------------------------------------------------------------------------------------------ No results for input(s): TSH, T4TOTAL, T3FREE, THYROIDAB in the last 72 hours.  Invalid input(s): FREET3 ------------------------------------------------------------------------------------------------------------------ No results for input(s): VITAMINB12, FOLATE, FERRITIN, TIBC, IRON, RETICCTPCT in the last 72 hours.  Coagulation profile Recent Labs  Lab 07/23/17 0703 07/24/17 0349 07/25/17 0410 07/26/17 0437  INR 2.52 2.48 2.37 2.32    No results for input(s): DDIMER in the last 72 hours.  Cardiac Enzymes No results for input(s): CKMB, TROPONINI, MYOGLOBIN in the last 168 hours.  Invalid input(s): CK ------------------------------------------------------------------------------------------------------------------ Invalid input(s): [place]  Patient is a 77 year old admitted with severe right knee pain and gait dysfunction  1.  Right knee pain suspect due to mechanical reasons patient states that this is been a recurrent issue now getting worse .  Physical therapy recommended SNF placement.  MRI of the right knee is canceled by orthopedic doctor Dr. Marica Otter. .  Patient can be out of the bed to chair .   s.  Patient had right knee effusion for which she had arthrocentesis at [facility]/had a workup for the right knee pain as an outpatient as per the recommendation.  2.  Ambulatory dysfunction; PT commons SNF placement.  She needs 3 night stay  for her to go to rehab.   discharge on Wednesday as per case manager.  3.  Essential hypertension continue hydrochlorothiazide and metoprolol 4.  Hyperlipidemia continue Zetia 5.  Proximal atrial fibrillation: Continue warfarin, patient follows up with Dr. Ubaldo Glassing.,  Rate controlled, continue metoprolol, Cardizem, Coumadin INR is therapeutic.  6 morbid obesity weight loss recommended.  7 .  E. coli  UTI resistant to Cipro, Bactrim, start Rocephin while in the  hospital and discharge to rehab with Macrodantin.      Code Status Orders  (From admission, onward)        Start     Ordered   07/23/17 0620  Limited resuscitation (code)  Continuous    Question Answer Comment  In the event of cardiac or respiratory ARREST: Initiate Code Blue, Call Rapid Response Yes   In the event of cardiac or respiratory ARREST: Perform CPR No   In the event of cardiac or respiratory ARREST: Perform Intubation/Mechanical Ventilation Yes   In the event of cardiac or respiratory ARREST: Use NIPPV/BiPAp only if indicated Yes   In the event of cardiac or respiratory ARREST: Administer ACLS medications if indicated Yes   In the event of cardiac or respiratory ARREST: Perform Defibrillation or Cardioversion if indicated Yes      07/23/17 0619    Code Status History    Date Active Date Inactive Code Status Order ID Comments User Context   07/23/2017 05:39 07/23/2017 06:19 Full Code [id number]  Saundra Shelling, MD Inpatient   09/23/2016 19:28 09/26/2016 16:20 Partial Code [id number]  Bettey Costa, MD Inpatient   03/24/2016 15:11 04/01/2016 17:55 Full Code [id number]  Wilhelmina Mcardle, MD ED   12/12/2014 03:01 12/20/2014 19:58 Full Code [id number]  Lance Coon, MD Inpatient           Consults orthopedic surgery  DVT Prophylaxis Coumadin  Lab Results  Component Value Date   PLT 211 07/23/2017     Time Spent in minutes   45 minutes greater than 50% of time spent in care coordination and counseling patient regarding the condition and plan of care.   Epifanio Lesches M.D on 07/26/2017 at 11:13 AM  Between 7am to 6pm - Pager - [phone]  After 6pm go to [email or web address] - password [email or web address] [facility]   Office  [phone]

## 2017-07-26 NOTE — Progress Notes (Deleted)
Weaned off oxygen this shift without any issues,iv antibiotics continue,PT in progress and well tolerated,vital signs within normal limits,denies pain.

## 2017-07-26 NOTE — Progress Notes (Signed)
ANTICOAGULATION CONSULT NOTE - Follow up [place] for warfarin Indication: atrial fibrillation  Allergies  Allergen Reactions  . Amoxicillin Other (See Comments)    Lips swelling, tingling  . Benzocaine-Menthol     Throat swelling  . Biafine [Wound Dressings] Swelling  . Chloraseptic Sore Throat [Acetaminophen] Other (See Comments)    throat swelling  . Fosamax [Alendronate Sodium]   . Neosporin [Neomycin-Bacitracin Zn-Polymyx] Other (See Comments)    Skin redness, puffy  . Other     "chlorotrimitron"-- rxn: throat swelling  . Oxycodone Other (See Comments)    Dizziness, lips tingling  . Statins Other (See Comments)    Muscle weakness, weak  . Tape Other (See Comments)    Skin redness    Patient Measurements: Height: 5\' 5"  (165.1 cm) Weight: 277 lb (125.6 kg) IBW/kg (Calculated) : 57  Vital Signs: Temp: 98.2 F (36.8 C) (02/04 0831) Temp Source: Oral (02/04 0831) BP: 126/49 (02/04 0831) Pulse Rate: 77 (02/04 0831)  Labs: Recent Labs    07/24/17 0349 07/25/17 0410 07/26/17 0437  LABPROT 26.6* 25.7* 25.3*  INR 2.48 2.37 2.32    Estimated Creatinine Clearance: 81 mL/min (by C-G formula based on SCr of 0.77 mg/dL).  Assessment: Pharmacy consulted to dose and monitor warfarin in this 77 year old female who was taking warfarin prior to admission.   Patient reports taking warfarin 4 mg PO daily and reports taking dose on 1/31 prior to admission.   Dosing History Date INR Dose 2/1 2.5        4mg  2/2       2.48   4 mg 2/3       2.37   4 mg  2/4      2.32   Goal of Therapy:  INR 2-3   Plan:  Continue home dose of warfarin 4 mg PO daily. INR to be rechecked with AM labs tomorrow.  Larene Beach, [facility] Clinical Pharmacist 07/26/2017,10:41 AM

## 2017-07-27 LAB — PROTIME-INR
INR: 2.11
Prothrombin Time: 23.5 seconds — ABNORMAL HIGH (ref 11.4–15.2)

## 2017-07-27 MED ORDER — WARFARIN SODIUM 4 MG PO TABS
4.0000 mg | ORAL_TABLET | Freq: Every day | ORAL | Status: DC
  Filled 2017-07-27: qty 1

## 2017-07-27 MED ORDER — WARFARIN SODIUM 6 MG PO TABS
6.0000 mg | ORAL_TABLET | Freq: Once | ORAL | Status: AC
  Administered 2017-07-27: 6 mg via ORAL
  Filled 2017-07-27: qty 1

## 2017-07-27 NOTE — Care Management Important Message (Signed)
Important Message  Patient Details  Name: Laura Gonzalez MRN: [id number] Date of Birth: Mar 18, 1941   Medicare Important Message Given:  Yes    Shelbie Ammons, RN 07/27/2017, 6:26 AM

## 2017-07-27 NOTE — Progress Notes (Signed)
Trouble breathing 78 on room air for first time sound Physicians - [place] at [facility]                                                                                                                                                                                  Patient Demographics   Laura Gonzalez, is a 77 y.o. female, DOB - 05/08/1941, XTK:[id number]  Admit date - 07/22/2017   Admitting Physician Saundra Shelling, MD  Outpatient Primary MD for the patient is [name], MD   LOS - 2  Subjective: Denies any complaints except right knee pain, also back pain because of her position in the bed Review of Systems:   CONSTITUTIONAL: No documented fever. No fatigue, weakness. No weight gain, no weight loss.  EYES: No blurry or double vision.  ENT: No tinnitus. No postnasal drip. No redness of the oropharynx.  RESPIRATORY: No cough, no wheeze, no hemoptysis. No dyspnea.  CARDIOVASCULAR: No chest pain. No orthopnea. No palpitations. No syncope.  GASTROINTESTINAL: No nausea, no vomiting or diarrhea. No abdominal pain. No melena or hematochezia.  GENITOURINARY: No dysuria or hematuria.  ENDOCRINE: No polyuria or nocturia. No heat or cold intolerance.  HEMATOLOGY: No anemia. No bruising. No bleeding.  INTEGUMENTARY: No rashes. No lesions.  MUSCULOSKELETAL: Right knee pain and swelling, has immobilizer present for  right knee.  Complains of low back pain  NEUROLOGIC: No numbness, tingling, or ataxia. No seizure-type activity.  PSYCHIATRIC: No anxiety. No insomnia. No ADD.    Vitals:   Vitals:   07/26/17 1929 07/27/17 0504 07/27/17 0505 07/27/17 0930  BP: (!) 117/44 (!) 121/53 (!) 120/50 (!) 122/55  Pulse: 83 75 74 82  Resp: 19     Temp: 98.7 F (37.1 C) 98.3 F (36.8 C) 98.8 F (37.1 C)   TempSrc: Oral Oral    SpO2: 92% 95% 94%   Weight:      Height:        Wt Readings from Last 3 Encounters:  07/22/17 125.6 kg (277 lb)  12/04/16 124.7 kg (275 lb)  09/26/16  125.1 kg (275 lb 12.8 oz)     Intake/Output Summary (Last 24 hours) at 07/27/2017 1311 Last data filed at 07/27/2017 1007 Gross per 24 hour  Intake 720 ml  Output -  Net 720 ml    Physical Exam:   GENERAL: Pleasant-appearing in no apparent distress.  HEAD, EYES, EARS, NOSE AND THROAT: Atraumatic, normocephalic. Extraocular muscles are intact. Pupils equal and reactive to light. Sclerae anicteric. No conjunctival injection. No oro-pharyngeal erythema.   NECK: Supple. There is no jugular venous distention. No bruits, no lymphadenopathy, no thyromegaly.   HEART:  Regular rate and rhythm,. No murmurs, no rubs, no clicks.  LUNGS: Clear to auscultation bilaterally. No rales or rhonchi. No wheezes.  ABDOMEN: Soft, flat, nontender, nondistended. Has good bowel sounds. No hepatosplenomegaly appreciated.  EXTREMITIES: Immobilizer present for the right knee.  NEUROLOGIC: The patient is alert, awake, and oriented x3 with no focal motor or sensory deficits appreciated bilaterally.  SKIN: Moist and warm with no rashes appreciated.  Psych: Not anxious, depressed LN: No inguinal LN enlargement    Antibiotics   Anti-infectives (From admission, onward)   Start     Dose/Rate Route Frequency Ordered Stop   07/25/17 1230  cefTRIAXone (ROCEPHIN) 1 g in dextrose 5 % 50 mL IVPB  Status:  Discontinued     1 g 100 mL/hr over 30 Minutes Intravenous Every 24 hours 07/25/17 1224 07/26/17 1453   07/24/17 1000  ciprofloxacin (CIPRO) tablet 500 mg  Status:  Discontinued     500 mg Oral 2 times daily 07/24/17 0855 07/25/17 1222      Medications   Scheduled Meds: . acidophilus  1 capsule Oral Daily  . aspirin EC  81 mg Oral Daily  . cholecalciferol  5,000 Units Oral Daily  . diltiazem  120 mg Oral Daily   And  . diltiazem  180 mg Oral Daily  . ezetimibe  10 mg Oral Daily  . fluticasone  1 spray Each Nare Daily  . hydrochlorothiazide  12.5 mg Oral Daily  . loratadine  10 mg Oral Daily  . magnesium  oxide  400 mg Oral Daily  . metoprolol tartrate  12.5 mg Oral BID  . montelukast  10 mg Oral QHS  . multivitamin with minerals  2 tablet Oral Daily  . nitrofurantoin (macrocrystal-monohydrate)  100 mg Oral Q12H  . sodium chloride flush  3 mL Intravenous Q12H  . vitamin B-12  3,000 mcg Oral Daily  . [START ON 07/28/2017] warfarin  4 mg Oral q1800  . warfarin  6 mg Oral ONCE-1800  . Warfarin - Pharmacist Dosing Inpatient   Does not apply q1800   Continuous Infusions: . sodium chloride     PRN Meds:.sodium chloride, albuterol, albuterol, bisacodyl, guaiFENesin **AND** dextromethorphan, ondansetron **OR** ondansetron (ZOFRAN) IV, senna-docusate, sodium chloride flush, traMADol   Data Review:   Micro Results Recent Results (from the past 240 hour(s))  Urine Culture     Status: Abnormal   Collection Time: 07/23/17  1:00 AM  Result Value Ref Range Status   Specimen Description   Final    URINE, RANDOM Performed at [facility], [address] [place], [place] [address]    Special Requests   Final    NONE Performed at [facility], [address], [place],  [address]    Culture (A)  Final    >=100,000 COLONIES/mL ESCHERICHIA COLI 50,000 COLONIES/mL ENTEROCOCCUS FAECALIS    Report Status 07/26/2017 FINAL  Final   Organism ID, Bacteria ESCHERICHIA COLI (A)  Final   Organism ID, Bacteria ENTEROCOCCUS FAECALIS (A)  Final      Susceptibility   Escherichia coli - MIC*    AMPICILLIN 8 SENSITIVE Sensitive     CEFAZOLIN <=4 SENSITIVE Sensitive     CEFTRIAXONE <=1 SENSITIVE Sensitive     CIPROFLOXACIN >=4 RESISTANT Resistant     GENTAMICIN <=1 SENSITIVE Sensitive     IMIPENEM <=0.25 SENSITIVE Sensitive     NITROFURANTOIN <=16 SENSITIVE Sensitive     TRIMETH/SULFA >=320 RESISTANT Resistant     AMPICILLIN/SULBACTAM 4 SENSITIVE Sensitive  PIP/TAZO <=4 SENSITIVE Sensitive     Extended ESBL NEGATIVE Sensitive     * >=100,000 COLONIES/mL ESCHERICHIA COLI    Enterococcus faecalis - MIC*    AMPICILLIN <=2 SENSITIVE Sensitive     LEVOFLOXACIN 1 SENSITIVE Sensitive     NITROFURANTOIN <=16 SENSITIVE Sensitive     VANCOMYCIN 1 SENSITIVE Sensitive     * 50,000 COLONIES/mL ENTEROCOCCUS FAECALIS    Radiology Reports Dg Knee 2 Views Right  Result Date: 07/23/2017 CLINICAL DATA:  Right knee pain. 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 No recent injury. EXAM: RIGHT KNEE - 1-2 VIEW COMPARISON:  12/04/2016 FINDINGS: Postoperative changes with right total knee arthroplasty including patellar femoral component. Components appear well seated. No evidence of acute fracture or dislocation. No focal bone lesion or bone destruction. Small right knee effusion. No radiopaque soft tissue foreign bodies. IMPRESSION: Right total knee arthroplasty. Components appear well seated. No acute fracture or dislocation. Small effusion. Electronically Signed   By: Lucienne Capers M.D.   On: 07/23/2017 01:37     CBC Recent Labs  Lab 07/23/17 0009  WBC 9.3  HGB 14.6  HCT 44.6  PLT 211  MCV 91.9  MCH 30.1  MCHC 32.8  RDW 13.9    Chemistries  Recent Labs  Lab 07/23/17 0009 07/23/17 0703  NA 141 141  K 4.1 4.3  CL 104 104  CO2 26 27  GLUCOSE 122* 104*  BUN 20 17  CREATININE 0.75 0.77  CALCIUM 10.2 9.4  AST 35  --   ALT 27  --   ALKPHOS 78  --   BILITOT 0.4  --    ------------------------------------------------------------------------------------------------------------------ estimated creatinine clearance is 81 mL/min (by C-G formula based on SCr of 0.77 mg/dL). ------------------------------------------------------------------------------------------------------------------ No results for input(s): HGBA1C in the last 72 hours. ------------------------------------------------------------------------------------------------------------------ No results for input(s): CHOL, HDL, LDLCALC, TRIG, CHOLHDL, LDLDIRECT in the last 72  hours. ------------------------------------------------------------------------------------------------------------------ No results for input(s): TSH, T4TOTAL, T3FREE, THYROIDAB in the last 72 hours.  Invalid input(s): FREET3 ------------------------------------------------------------------------------------------------------------------ No results for input(s): VITAMINB12, FOLATE, FERRITIN, TIBC, IRON, RETICCTPCT in the last 72 hours.  Coagulation profile Recent Labs  Lab 07/23/17 0703 07/24/17 0349 07/25/17 0410 07/26/17 0437 07/27/17 0457  INR 2.52 2.48 2.37 2.32 2.11    No results for input(s): DDIMER in the last 72 hours.  Cardiac Enzymes No results for input(s): CKMB, TROPONINI, MYOGLOBIN in the last 168 hours.  Invalid input(s): CK ------------------------------------------------------------------------------------------------------------------ Invalid input(s): Norco  Patient is a 77 year old admitted with severe right knee pain and gait dysfunction  1.  Right knee pain suspect due to mechanical reasons patient states that this is been a recurrent issue now getting worse .  Physical therapy recommended SNF placement.  MRI of the right knee is canceled by orthopedic doctor Dr. Marica Otter. .  Patient can be out of the bed to chair .   s.  Patient had right knee effusion for which she had arthrocentesis at [facility]/had a workup for the right knee pain as an outpatient as per the recommendation.  2.  Ambulatory dysfunction; PT commons SNF placement.  She needs 3 night stay  for her to go to rehab.   discharge on Wednesday as per case manager.   3.  Essential hypertension continue hydrochlorothiazide and metoprolol 4.  Hyperlipidemia continue Zetia 5.  Proximal atrial fibrillation: Continue warfarin, patient follows up with Dr. Ubaldo Glassing.,  Rate controlled, continue metoprolol, Cardizem, Coumadin INR is therapeutic.  6 .morbid  obesity weight loss  recommended .  7 .  E. coli UTI resistant to Cipro, Bactrim, start Rocephin while in the hospital and discharge to rehab with Macrodantin.      Code Status Orders  (From admission, onward)        Start     Ordered   07/23/17 0620  Limited resuscitation (code)  Continuous    Question Answer Comment  In the event of cardiac or respiratory ARREST: Initiate Code Blue, Call Rapid Response Yes   In the event of cardiac or respiratory ARREST: Perform CPR No   In the event of cardiac or respiratory ARREST: Perform Intubation/Mechanical Ventilation Yes   In the event of cardiac or respiratory ARREST: Use NIPPV/BiPAp only if indicated Yes   In the event of cardiac or respiratory ARREST: Administer ACLS medications if indicated Yes   In the event of cardiac or respiratory ARREST: Perform Defibrillation or Cardioversion if indicated Yes      07/23/17 0619    Code Status History    Date Active Date Inactive Code Status Order ID Comments User Context   07/23/2017 05:39 07/23/2017 06:19 Full Code [id number]  Saundra Shelling, MD Inpatient   09/23/2016 19:28 09/26/2016 16:20 Partial Code [id number]  Bettey Costa, MD Inpatient   03/24/2016 15:11 04/01/2016 17:55 Full Code [id number]  Wilhelmina Mcardle, MD ED   12/12/2014 03:01 12/20/2014 19:58 Full Code [id number]  Lance Coon, MD Inpatient           Consults orthopedic surgery  DVT Prophylaxis Coumadin  Lab Results  Component Value Date   PLT 211 07/23/2017     Time Spent in minutes   45 minutes greater than 50% of time spent in care coordination and counseling patient regarding the condition and plan of care.   Epifanio Lesches M.D on 07/27/2017 at 1:11 PM  Between 7am to 6pm - Pager - [phone]  After 6pm go to [email or web address] - password [email or web address] [facility]   Office  [phone]

## 2017-07-27 NOTE — Progress Notes (Signed)
Physical Therapy Treatment Patient Details Name: Laura Gonzalez MRN: [id number] DOB: 15-Oct-1940 Today's Date: 07/27/2017    History of Present Illness Pt is a 77 y.o. female with a known history of arthritis of the knee and wrist, skin cancer, bronchial asthma, GERD, hyperlipidemia, hypertension, osteoporosis, sleep apnea presented to the emergency room with right knee pain.  Patient had a right knee total replacement couple of years ago.  She has more pain since yesterday and unable to ambulate and bend the knee.  She is not able to ambulate and move around at home because of the pain.  Patient lives alone and no one to take care of her.  She was put on a brace in the emergency room imaging studies showed no fracture hospitalist service was consulted    PT Comments    Pt was seen for treatment of R knee to don brace and educate her about the application.   After she was assisted to walk and use BSC with ice applied to R knee for pain.  Pt is comfortable after walk with ice and elevation of leg, brace next to her with straps organized for better application next time.  Follow acutely for walking and will try to do some exercising on R knee as tolerated.  Follow Up Recommendations  SNF     Equipment Recommendations  None recommended by PT    Recommendations for Other Services       Precautions / Restrictions Precautions Precautions: Fall Required Braces or Orthoses: Knee Immobilizer - Right(placed in ED but not ordered ) Knee Immobilizer - Right: On when out of bed or walking Restrictions Weight Bearing Restrictions: Yes RLE Weight Bearing: Weight bearing as tolerated    Mobility  Bed Mobility Overal bed mobility: Modified Independent             General bed mobility comments: extra time to move legs off bed to side of b ed  Transfers Overall transfer level: Needs assistance Equipment used: Rolling walker (2 wheeled) Transfers: Sit to/from Stand Sit to Stand: Min assist          General transfer comment: help to power up then pt can control with RW  Ambulation/Gait Ambulation/Gait assistance: Min guard Ambulation Distance (Feet): 70 Feet Assistive device: Rolling walker (2 wheeled) Gait Pattern/deviations: Wide base of support;Decreased stride length;Step-to pattern;Step-through pattern Gait velocity: reduced Gait velocity interpretation: Below normal speed for age/gender General Gait Details: able to  maneuver to [facility] and control braced RLE to get onto [facility].  After that she walked and then asked to get back to bed   Stairs            Wheelchair Mobility    Modified Rankin (Stroke Patients Only)       Balance Overall balance assessment: Modified Independent                                          Cognition Arousal/Alertness: Awake/alert Behavior During Therapy: WFL for tasks assessed/performed Overall Cognitive Status: Within Functional Limits for tasks assessed                                        Exercises      General Comments        Pertinent Vitals/Pain Pain Assessment: Faces Faces  Pain Scale: Hurts even more Pain Location: R knee with ambulation Pain Descriptors / Indicators: Aching;Tender Pain Intervention(s): Limited activity within patient's tolerance;Monitored during session;Premedicated before session;Repositioned;Ice applied    Home Living                      Prior Function            PT Goals (current goals can now be found in the care plan section) Acute Rehab PT Goals Patient Stated Goal: to get stronger and go home Progress towards PT goals: Progressing toward goals    Frequency    Min 2X/week      PT Plan Current plan remains appropriate    Co-evaluation              AM-PAC PT "6 Clicks" Daily Activity  Outcome Measure  Difficulty turning over in bed (including adjusting bedclothes, sheets and blankets)?: A Little Difficulty moving from  lying on back to sitting on the side of the bed? : A Little Difficulty sitting down on and standing up from a chair with arms (e.g., wheelchair, bedside commode, etc,.)?: A Little Help needed moving to and from a bed to chair (including a wheelchair)?: A Little Help needed walking in hospital room?: A Little Help needed climbing 3-5 steps with a railing? : A Little 6 Click Score: 18    End of Session Equipment Utilized During Treatment: Gait belt;Right knee immobilizer Activity Tolerance: Patient tolerated treatment well Patient left: in bed;with call bell/phone within reach;with bed alarm set(ice on R knee) Nurse Communication: Mobility status PT Visit Diagnosis: Unsteadiness on feet (R26.81);Muscle weakness (generalized) (M62.81);Other abnormalities of gait and mobility (R26.89);Pain Pain - Right/Left: Right Pain - part of body: Knee     Time: 6384-6659 PT Time Calculation (min) (ACUTE ONLY): 31 min  Charges:  $Gait Training: 8-22 mins $Therapeutic Activity: 8-22 mins                    G Codes:  Functional Assessment Tool Used: AM-PAC 6 Clicks Basic Mobility     Ramond Dial 07/27/2017, 5:03 PM   Mee Hives, PT MS Acute Rehab Dept. Number: [place] [facility] [place]

## 2017-07-27 NOTE — Progress Notes (Signed)
ANTICOAGULATION CONSULT NOTE - Follow up Laura Gonzalez for warfarin Indication: atrial fibrillation  Allergies  Allergen Reactions  . Amoxicillin Other (See Comments)    Lips swelling, tingling  . Benzocaine-Menthol     Throat swelling  . Biafine [Wound Dressings] Swelling  . Chloraseptic Sore Throat [Acetaminophen] Other (See Comments)    throat swelling  . Fosamax [Alendronate Sodium]   . Neosporin [Neomycin-Bacitracin Zn-Polymyx] Other (See Comments)    Skin redness, puffy  . Other     "chlorotrimitron"-- rxn: throat swelling  . Oxycodone Other (See Comments)    Dizziness, lips tingling  . Statins Other (See Comments)    Muscle weakness, weak  . Tape Other (See Comments)    Skin redness    Patient Measurements: Height: 5\' 5"  (165.1 cm) Weight: 277 lb (125.6 kg) IBW/kg (Calculated) : 57  Vital Signs: Temp: 98.8 F (37.1 C) (02/05 0505) Temp Source: Oral (02/05 0504) BP: 120/50 (02/05 0505) Pulse Rate: 74 (02/05 0505)  Labs: Recent Labs    07/25/17 0410 07/26/17 0437 07/27/17 0457  LABPROT 25.7* 25.3* 23.5*  INR 2.37 2.32 2.11    Estimated Creatinine Clearance: 81 mL/min (by C-G formula based on SCr of 0.77 mg/dL).  Assessment: Pharmacy consulted to dose and monitor warfarin in this 77 year old female who was taking warfarin prior to admission.   Patient reports taking warfarin 4 mg PO daily and reports taking dose on 1/31 prior to admission.   Dosing History Date INR Dose 2/1 2.5        4mg  2/2       2.48   4 mg 2/3       2.37   4 mg  2/4      2.32  4 mg 2/5      2.11   Goal of Therapy:  INR 2-3   Plan:  Will give warfarin 6 mg PO x 1 and will then continue warfarin 4 mg (home dose).  INR to be rechecked with AM labs tomorrow.  Larene Beach, [facility] Clinical Pharmacist 07/27/2017,9:03 AM

## 2017-07-28 LAB — PROTIME-INR
INR: 2.12
Prothrombin Time: 23.6 seconds — ABNORMAL HIGH (ref 11.4–15.2)

## 2017-07-28 NOTE — Progress Notes (Signed)
Pt ready for d/c to SNF today per MD. Report called to Margaretha Sheffield at [place], all questions answered. Pt's f/u appts made with Dr. Harlow Mares and PCP. EMS transportation set up for 1pm. PIV removed, pt belongings gathered. Pt VSS, in NAD, knee immobilizer in place to right leg.   [place], Jerry Caras

## 2017-07-28 NOTE — Discharge Summary (Signed)
Laura Gonzalez, is a 77 y.o. female  DOB 1940-10-08  MRN [id number].  Admission date:  07/22/2017  Admitting Physician  Saundra Shelling, MD  Discharge Date:  07/28/2017   Primary MD  [name], MD  Recommendations for primary care physician for things to follow:   Follow-up with PCP in 1 week Follow-up with Dr. Harlow Mares from orthopedic in 2 weeks   Admission Diagnosis  Chronic pain of right knee [M25.561, G89.29]   Discharge Diagnosis  Chronic pain of right knee [M25.561, G89.29]    Active Problems:   Gait instability   Right knee pain      Past Medical History:  Diagnosis Date  . Arthritis    wrist and knees  . Asthma   . Cancer ([place])    skin cancer  . Carpal tunnel syndrome   . Chicken pox   . Degenerative arthritis of knee, bilateral   . Diverticulitis   . Diverticulosis   . Dysrhythmia    afib  . GERD (gastroesophageal reflux disease)   . Hernia, umbilical   . Hyperlipidemia   . Hypertension   . Numbness and tingling    arm to leg  . Osteoporosis   . Pneumonia   . Shortness of breath    only with astma attacks  . Sleep apnea     Past Surgical History:  Procedure Laterality Date  . ANTERIOR CERVICAL DECOMP/DISCECTOMY FUSION N/A 08/09/2012   Procedure: ANTERIOR CERVICAL DECOMPRESSION/DISCECTOMY FUSION 2 LEVELS;  Surgeon: [name], MD;  Location: [facility];  Service: Neurosurgery;  Laterality: N/A;  C3-4 C4-5 Anterior cervical decompression/diskectomy/fusion/[employer] Bone/Trestle plate  . BREAST BIOPSY Left 1984   EXCISIONAL - NEG  . BREAST BIOPSY Left 1987   EXCISIONAL - NEG  . BREAST SURGERY    . COLONOSCOPY    . COLONOSCOPY WITH PROPOFOL N/A 07/29/2015   Procedure: COLONOSCOPY WITH PROPOFOL;  Surgeon: [name], MD;  Location: [facility] ENDOSCOPY;  Service: Endoscopy;   Laterality: N/A;  . colonscopy  2000,2007,2012  . ESOPHAGOGASTRODUODENOSCOPY    . HERNIA REPAIR    . JOINT REPLACEMENT Right    Total Knee Replacement  . LIPOMA EXCISION Right 2010   back  . MASTECTOMY PARTIAL / LUMPECTOMY Left 1980s  . ROTATOR CUFF REPAIR Bilateral right- 2009, left 2011  . SKIN CANCER EXCISION  2009   back of neck and right cheek  . TONSILLECTOMY  1960  . TRACHEOSTOMY    . UMBILICAL HERNIA REPAIR  [id number]  . VARICOSE VEIN SURGERY Left 04/2009 rt 2011  . [place]  . WRIST SURGERY Right 1998   external fixator       History of present illness and  Hospital Course:     Kindly see H&P for history of present illness and admission details, please review complete Labs, Consult reports and Test reports for all details in brief  HPI  from the history and physical done on the day of admission 77 year old female patient history of knee arthritis, GERD, hyperlipidemia, sleep apnea came to ER because of right knee pain, ambulatory difficulty.  Because of severe right knee pain patient unable to ambulate and bend the knee.  And patient in knee pain got worse and she is having really hard time ambulating and moving around the home so she came to hospital.   Hospital Course  Right knee pain, ambulatory difficulties, patient has chronic severe knee pain and swelling since her replacement in 2015  with Dr. Marry Guan, evaluated by Dr. Marica Otter  multiple times, because of severe knee pain and unable to ambulate at home associated with swelling patient is admitted to hospital, seen by physical therapy, orthopedic.,  Initial plan to get MRI of the left knee but that is canceled by orthopedic because it will not change the management.  Physical therapy recommended skilled nursing, patient is going to [facility] today. Continue Percocet 5/3 25 mg every 4 hours as needed for pain and continue physical therapy.  Dr. Exie Parody from orthopedic and see her in the office in 2-3  weeks. 2.  E. coli UTI, sensitive to Macrodantin: Patient will receive Macrodantin for 7 more days, received Rocephin in the hospital, patient needs repeat urine cultures after finishing the treatment to make sure UTI resolves. 3.  History of COPD: No wheezing.  Continue Ventolin inhaler 2 puffs every 6 hours as needed.  Patient also on Dulera 200/5 mcg 2 puffs twice daily. 4. chronic atrial fibrillation: Rate controlled with Cardizem CD300 mg daily, metoprolol 25 mg p.o. twice daily.  Coumadin 4 mg at night.   Discharge Condition: stable   Follow UP   Contact information for follow-up providers    Lovell Sheehan, MD Follow up today.   Specialty:  Orthopedic Surgery Contact information: [place] [address] [phone]            Contact information for after-discharge care    Destination    [facility] Follow up.   Service:  Skilled Nursing Contact information: [address] [place] [phone]                    Discharge Instructions  and  Discharge Medications     Allergies as of 07/28/2017      Reactions   Amoxicillin Other (See Comments)   Lips swelling, tingling   Benzocaine-menthol    Throat swelling   Biafine [wound Dressings] Swelling   Chloraseptic Sore Throat [acetaminophen] Other (See Comments)   throat swelling   Fosamax [alendronate Sodium]    Neosporin [neomycin-bacitracin Zn-polymyx] Other (See Comments)   Skin redness, puffy   Other    "chlorotrimitron"-- rxn: throat swelling   Oxycodone Other (See Comments)   Dizziness, lips tingling   Statins Other (See Comments)   Muscle weakness, weak   Tape Other (See Comments)   Skin redness      Medication List    STOP taking these medications   azithromycin 500 MG tablet Commonly known as:  ZITHROMAX   dextromethorphan-guaiFENesin 30-600 MG 12hr tablet Commonly known as:  MUCINEX DM   multivitamin with minerals Tabs  tablet   potassium chloride SA 20 MEQ tablet Commonly known as:  K-DUR,KLOR-CON   predniSONE 10 MG tablet Commonly known as:  DELTASONE   PRESERVISION AREDS PO     TAKE these medications   acetaminophen 325 MG tablet Commonly known as:  TYLENOL Take 650 mg by mouth every 6 (six) hours as needed for mild pain, moderate pain or headache.   acidophilus Caps capsule Take 1 capsule by mouth daily.   albuterol (2.5 MG/3ML) 0.083% nebulizer solution Commonly known as:  PROVENTIL Take 2.5 mg by nebulization every 6 (six) hours as needed for wheezing or shortness of breath.   albuterol 108 (90 Base) MCG/ACT inhaler Commonly known as:  PROVENTIL HFA;VENTOLIN HFA Inhale 2 puffs into the lungs every 6 (six) hours as needed. For shortness of breath   alendronate 70  MG tablet Commonly known as:  FOSAMAX Take 70 mg by mouth once a week. Take with a full glass of water on an empty stomach. On Sundays   aspirin EC 81 MG tablet Take 81 mg by mouth daily as needed.   AYR SALINE NASAL GEL NA Place 1 application into the nose as needed.   bisacodyl 5 MG EC tablet Generic drug:  bisacodyl Take 5 mg by mouth daily as needed for moderate constipation.   CITRACAL PO Take 2 tablets by mouth daily.   [employer] HEARTBURN RELIEF PO Take 1 tablet by mouth as needed.   diltiazem 300 MG 24 hr capsule Commonly known as:  CARDIZEM CD Take 300 mg by mouth daily.   diltiazem 60 MG tablet Commonly known as:  CARDIZEM Take 60 mg by mouth every 6 (six) hours as needed. Takes if has palpitation after taking diltiazem 300mg  dose   diphenhydrAMINE 25 MG tablet Commonly known as:  BENADRYL Take 25 mg by mouth every 6 (six) hours as needed.   ezetimibe 10 MG tablet Commonly known as:  ZETIA Take 10 mg by mouth daily.   fluticasone 50 MCG/ACT nasal spray Commonly known as:  FLONASE Place 1 spray into both nostrils daily.   hydrochlorothiazide 12.5 MG tablet Commonly known as:  HYDRODIURIL Take 1  tablet (12.5 mg total) by mouth daily.   loratadine 10 MG tablet Commonly known as:  CLARITIN Take 10 mg by mouth daily.   magnesium oxide 400 MG tablet Commonly known as:  MAG-OX Take 800 mg by mouth daily.   MEGARED OMEGA-3 KRILL OIL 500 MG Caps Take 1 capsule by mouth daily.   metoprolol tartrate 25 MG tablet Commonly known as:  LOPRESSOR Take 12.5 mg by mouth 2 (two) times daily.   mometasone-formoterol 200-5 MCG/ACT Aero Commonly known as:  DULERA Inhale 2 puffs into the lungs 2 (two) times daily.   montelukast 10 MG tablet Commonly known as:  SINGULAIR Take 10 mg by mouth at bedtime.   nitrofurantoin 100 MG capsule Commonly known as:  MACRODANTIN Take 1 capsule (100 mg total) by mouth 2 (two) times daily for 7 days. Continue 7 days for urine infection   oxyCODONE-acetaminophen 5-325 MG tablet Commonly known as:  PERCOCET Take 1 tablet by mouth every 4 (four) hours as needed for severe pain.   trolamine salicylate 10 % cream Commonly known as:  ASPERCREME Apply 1 application topically as needed for muscle pain.   vitamin B-12 1000 MCG tablet Commonly known as:  CYANOCOBALAMIN Take 3,000 mcg by mouth daily.   Vitamin D3 5000 units Caps Take 1 capsule by mouth daily.   warfarin 2 MG tablet Commonly known as:  COUMADIN Take 1 tablet (2 mg total) by mouth daily. What changed:  how much to take         Diet and Activity recommendation: See Discharge Instructions above   Consults obtained -orthopedic consult, physical therapy consult, case management   Major procedures and Radiology Reports - PLEASE review detailed and final reports for all details, in brief -      Dg Knee 2 Views Right  Result Date: 07/23/2017 CLINICAL DATA:  Right knee pain. 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 No recent injury. EXAM: RIGHT KNEE - 1-2 VIEW COMPARISON:  12/04/2016 FINDINGS: Postoperative changes with right total knee arthroplasty including patellar femoral  component. Components appear well seated. No evidence of acute fracture or dislocation. No focal bone lesion or bone destruction. Small right knee effusion.  No radiopaque soft tissue foreign bodies. IMPRESSION: Right total knee arthroplasty. Components appear well seated. No acute fracture or dislocation. Small effusion. Electronically Signed   By: Lucienne Capers M.D.   On: 07/23/2017 01:37    Micro Results     Recent Results (from the past 240 hour(s))  Urine Culture     Status: Abnormal   Collection Time: 07/23/17  1:00 AM  Result Value Ref Range Status   Specimen Description   Final    URINE, RANDOM Performed at [facility], [address] [place], [place] [address]    Special Requests   Final    NONE Performed at [facility], [place], [place] [id number]    Culture (A)  Final    >=100,000 COLONIES/mL ESCHERICHIA COLI 50,000 COLONIES/mL ENTEROCOCCUS FAECALIS    Report Status 07/26/2017 FINAL  Final   Organism ID, Bacteria ESCHERICHIA COLI (A)  Final   Organism ID, Bacteria ENTEROCOCCUS FAECALIS (A)  Final      Susceptibility   Escherichia coli - MIC*    AMPICILLIN 8 SENSITIVE Sensitive     CEFAZOLIN <=4 SENSITIVE Sensitive     CEFTRIAXONE <=1 SENSITIVE Sensitive     CIPROFLOXACIN >=4 RESISTANT Resistant     GENTAMICIN <=1 SENSITIVE Sensitive     IMIPENEM <=0.25 SENSITIVE Sensitive     NITROFURANTOIN <=16 SENSITIVE Sensitive     TRIMETH/SULFA >=320 RESISTANT Resistant     AMPICILLIN/SULBACTAM 4 SENSITIVE Sensitive     PIP/TAZO <=4 SENSITIVE Sensitive     Extended ESBL NEGATIVE Sensitive     * >=100,000 COLONIES/mL ESCHERICHIA COLI   Enterococcus faecalis - MIC*    AMPICILLIN <=2 SENSITIVE Sensitive     LEVOFLOXACIN 1 SENSITIVE Sensitive     NITROFURANTOIN <=16 SENSITIVE Sensitive     VANCOMYCIN 1 SENSITIVE Sensitive     * 50,000 COLONIES/mL ENTEROCOCCUS FAECALIS       Today   Subjective:   Laura Gonzalez today stable for  discharge. .  Objective:   Blood pressure 114/68, pulse 97, temperature 98.6 F (37 C), temperature source Oral, resp. rate 16, height 5\' 5"  (1.651 m), weight 125.6 kg (277 lb), SpO2 95 %.   Intake/Output Summary (Last 24 hours) at 07/28/2017 0926 Last data filed at 07/27/2017 1904 Gross per 24 hour  Intake 480 ml  Output -  Net 480 ml    Exam Awake Alert, Oriented x 3, No new F.N deficits, Normal affect [place].AT,PERRAL Supple Neck,No JVD, No cervical lymphadenopathy appriciated.  Symmetrical Chest wall movement, Good air movement bilaterally, CTAB RRR,No Gallops,Rubs or new Murmurs, No Parasternal Heave +ve B.Sounds, Abd Soft, Non tender, No organomegaly appriciated, No rebound -guarding or rigidity. No Cyanosis, Clubbing or edema, No new Rash or bruise  Data Review   CBC w Diff:  Lab Results  Component Value Date   WBC 9.3 07/23/2017   HGB 14.6 07/23/2017   HGB 14.4 10/10/2014   HCT 44.6 07/23/2017   HCT 43.9 10/10/2014   PLT 211 07/23/2017   PLT 203 10/10/2014   LYMPHOPCT 8 09/23/2016   LYMPHOPCT 11.4 06/19/2014   MONOPCT 11 09/23/2016   MONOPCT 13.2 06/19/2014   EOSPCT 0 09/23/2016   EOSPCT 0.6 06/19/2014   BASOPCT 1 09/23/2016   BASOPCT 0.3 06/19/2014    CMP:  Lab Results  Component Value Date   NA 141 07/23/2017   NA 138 10/10/2014   K 4.3 07/23/2017   K 3.3 (L) 10/10/2014   CL 104 07/23/2017  CL 105 10/10/2014   CO2 27 07/23/2017   CO2 25 10/10/2014   BUN 17 07/23/2017   BUN 12 10/10/2014   CREATININE 0.77 07/23/2017   CREATININE 0.69 10/10/2014   PROT 7.3 07/23/2017   PROT 5.9 (L) 03/26/2014   ALBUMIN 4.0 07/23/2017   ALBUMIN 2.5 (L) 03/26/2014   BILITOT 0.4 07/23/2017   BILITOT 0.3 03/26/2014   ALKPHOS 78 07/23/2017   ALKPHOS 63 03/26/2014   AST 35 07/23/2017   AST 19 03/26/2014   ALT 27 07/23/2017   ALT 37 03/26/2014  .   Total Time in preparing paper work, data evaluation and todays exam - 27 minutes  Epifanio Lesches M.D on  07/28/2017 at 9:26 AM    Note: This dictation was prepared with Dragon dictation along with smaller phrase technology. Any transcriptional errors that result from this process are unintentional.

## 2017-07-28 NOTE — Progress Notes (Signed)
Patient is medically stable for D/C to [facility] today. Per [facility] admissions coordinator at [facility] patient can come today to room 340. RN will call report at [phone] and arrange EMS for transport. Clinical Education officer, museum (CSW) sent D/C orders to [facility] via [employer]. Patient is aware of above. CSW left patient's neighbor Pasty a voicemail. Please reconsult if future social work needs arise. CSW signing off.   [employer], LCSW [phone]

## 2017-07-28 NOTE — Clinical Social Work Placement (Signed)
   CLINICAL SOCIAL WORK PLACEMENT  NOTE  Date:  07/28/2017  Patient Details  Name: Laura Gonzalez MRN: [id number] Date of Birth: 1940/09/28  Clinical Social Work is seeking post-discharge placement for this patient at the [place] level of care (*CSW will initial, date and re-position this form in  chart as items are completed):  Yes   Patient/family provided with [place] Work Department's list of facilities offering this level of care within the geographic area requested by the patient (or if unable, by the patient's family).  Yes   Patient/family informed of their freedom to choose among providers that offer the needed level of care, that participate in Medicare, Medicaid or managed care program needed by the patient, have an available bed and are willing to accept the patient.  Yes   Patient/family informed of [facility]'s ownership interest in [facility] and [facility], as well as of the fact that they are under no obligation to receive care at these facilities.  PASRR submitted to EDS on       PASRR number received on       Existing PASRR number confirmed on 07/25/17     FL2 transmitted to all facilities in geographic area requested by pt/family on 07/25/17     FL2 transmitted to all facilities within larger geographic area on       Patient informed that his/her managed care company has contracts with or will negotiate with certain facilities, including the following:        Yes   Patient/family informed of bed offers received.  Patient chooses bed at [facility]     Physician recommends and patient chooses bed at [facility]    Patient to be transferred to [facility] ) on 07/28/17.  Patient to be transferred to facility by [facility] )     Patient family notified on 07/28/17 of transfer.  Name of family member notified:  (CSW left patient's neighbor [name] a Advertising account executive. )     PHYSICIAN        Additional Comment:    _______________________________________________ Cherokee Boccio, Veronia Beets, LCSW 07/28/2017, 1:29 PM

## 2017-07-30 ENCOUNTER — Other Ambulatory Visit
Admission: RE | Admit: 2017-07-30 | Discharge: 2017-07-30 | Disposition: A | Discharge status: Home / Self Care | Payer: Medicare Other | Source: Skilled Nursing Facility | Attending: Gerontology | Admitting: Gerontology

## 2017-07-30 DIAGNOSIS — I482 Chronic atrial fibrillation: Secondary | ICD-10-CM | POA: Diagnosis present

## 2017-07-30 LAB — PROTIME-INR
INR: 2.09
Prothrombin Time: 23.3 seconds — ABNORMAL HIGH (ref 11.4–15.2)

## 2017-08-02 ENCOUNTER — Other Ambulatory Visit
Admission: RE | Admit: 2017-08-02 | Discharge: 2017-08-02 | Disposition: A | Discharge status: Home / Self Care | Payer: Medicare Other | Source: Skilled Nursing Facility | Attending: Gerontology | Admitting: Gerontology

## 2017-08-02 DIAGNOSIS — I482 Chronic atrial fibrillation: Secondary | ICD-10-CM | POA: Diagnosis present

## 2017-08-02 LAB — PROTIME-INR
INR: 1.57
Prothrombin Time: 18.6 seconds — ABNORMAL HIGH (ref 11.4–15.2)

## 2017-08-05 ENCOUNTER — Other Ambulatory Visit
Admission: RE | Admit: 2017-08-05 | Discharge: 2017-08-05 | Disposition: A | Discharge status: Home / Self Care | Payer: Medicare Other | Source: Ambulatory Visit | Attending: Gerontology | Admitting: Gerontology

## 2017-08-05 DIAGNOSIS — I482 Chronic atrial fibrillation: Secondary | ICD-10-CM | POA: Insufficient documentation

## 2017-08-05 LAB — COMPREHENSIVE METABOLIC PANEL
ALK PHOS: 64 U/L (ref 38–126)
ALT: 23 U/L (ref 14–54)
ANION GAP: 10 (ref 5–15)
AST: 22 U/L (ref 15–41)
Albumin: 3.2 g/dL — ABNORMAL LOW (ref 3.5–5.0)
BILIRUBIN TOTAL: 0.8 mg/dL (ref 0.3–1.2)
BUN: 17 mg/dL (ref 6–20)
CALCIUM: 9.4 mg/dL (ref 8.9–10.3)
CO2: 28 mmol/L (ref 22–32)
Chloride: 105 mmol/L (ref 101–111)
Creatinine, Ser: 0.79 mg/dL (ref 0.44–1.00)
GFR calc non Af Amer: >60 mL/min (ref >60)
GLUCOSE: 98 mg/dL (ref 65–99)
Potassium: 3.7 mmol/L (ref 3.5–5.1)
Sodium: 143 mmol/L (ref 135–145)
TOTAL PROTEIN: 6.6 g/dL (ref 6.5–8.1)

## 2017-08-05 LAB — CBC WITH DIFFERENTIAL/PLATELET
Basophils Absolute: 0.1 10*3/uL (ref 0–0.1)
Basophils Relative: 1 %
Eosinophils Absolute: 0.4 10*3/uL (ref 0–0.7)
Eosinophils Relative: 5 %
HEMATOCRIT: 42.2 % (ref 35.0–47.0)
HEMOGLOBIN: 13.8 g/dL (ref 12.0–16.0)
Lymphocytes Relative: 18 %
Lymphs Abs: 1.4 10*3/uL (ref 1.0–3.6)
MCH: 30.5 pg (ref 26.0–34.0)
MCHC: 32.8 g/dL (ref 32.0–36.0)
MCV: 92.8 fL (ref 80.0–100.0)
MONOS PCT: 14 %
Monocytes Absolute: 1.1 10*3/uL — ABNORMAL HIGH (ref 0.2–0.9)
NEUTROS ABS: 4.9 10*3/uL (ref 1.4–6.5)
NEUTROS PCT: 62 %
Platelets: 215 10*3/uL (ref 150–440)
RBC: 4.54 MIL/uL (ref 3.80–5.20)
RDW: 13.9 % (ref 11.5–14.5)
WBC: 7.9 10*3/uL (ref 3.6–11.0)

## 2017-08-05 LAB — PROTIME-INR
INR: 1.52
Prothrombin Time: 18.2 seconds — ABNORMAL HIGH (ref 11.4–15.2)

## 2017-08-09 ENCOUNTER — Non-Acute Institutional Stay (SKILLED_NURSING_FACILITY): Payer: Medicare Other | Admitting: Gerontology

## 2017-08-09 ENCOUNTER — Encounter: Payer: Self-pay | Admitting: Gerontology

## 2017-08-09 ENCOUNTER — Other Ambulatory Visit
Admission: RE | Admit: 2017-08-09 | Discharge: 2017-08-09 | Disposition: A | Discharge status: Home / Self Care | Payer: Medicare Other | Source: Skilled Nursing Facility | Attending: Gerontology | Admitting: Gerontology

## 2017-08-09 DIAGNOSIS — Z5181 Encounter for therapeutic drug level monitoring: Secondary | ICD-10-CM

## 2017-08-09 DIAGNOSIS — I482 Chronic atrial fibrillation: Secondary | ICD-10-CM | POA: Diagnosis present

## 2017-08-09 DIAGNOSIS — M25561 Pain in right knee: Secondary | ICD-10-CM

## 2017-08-09 DIAGNOSIS — I481 Persistent atrial fibrillation: Secondary | ICD-10-CM | POA: Diagnosis not present

## 2017-08-09 DIAGNOSIS — I4819 Other persistent atrial fibrillation: Secondary | ICD-10-CM

## 2017-08-09 LAB — PROTIME-INR
INR: 1.64
PROTHROMBIN TIME: 19.3 s — AB (ref 11.4–15.2)
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
## 2017-08-10 ENCOUNTER — Other Ambulatory Visit
Admission: RE | Admit: 2017-08-10 | Discharge: 2017-08-10 | Disposition: A | Discharge status: Home / Self Care | Payer: Medicare Other | Source: Ambulatory Visit | Attending: Gerontology | Admitting: Gerontology

## 2017-08-10 DIAGNOSIS — I482 Chronic atrial fibrillation: Secondary | ICD-10-CM | POA: Diagnosis present

## 2017-08-10 LAB — PROTIME-INR
INR: 1.62
Prothrombin Time: 19.1 seconds — ABNORMAL HIGH (ref 11.4–15.2)

## 2017-08-10 LAB — BASIC METABOLIC PANEL
Anion gap: 9 (ref 5–15)
BUN: 16 mg/dL (ref 6–20)
CHLORIDE: 106 mmol/L (ref 101–111)
CO2: 26 mmol/L (ref 22–32)
CREATININE: 0.73 mg/dL (ref 0.44–1.00)
Calcium: 8.8 mg/dL — ABNORMAL LOW (ref 8.9–10.3)
GFR calc non Af Amer: >60 mL/min (ref >60)
Glucose, Bld: 85 mg/dL (ref 65–99)
POTASSIUM: 3.9 mmol/L (ref 3.5–5.1)
Sodium: 141 mmol/L (ref 135–145)

## 2017-08-16 ENCOUNTER — Ambulatory Visit (INDEPENDENT_AMBULATORY_CARE_PROVIDER_SITE_OTHER): Payer: Medicare Other | Admitting: Vascular Surgery

## 2017-08-16 ENCOUNTER — Encounter (INDEPENDENT_AMBULATORY_CARE_PROVIDER_SITE_OTHER): Payer: Self-pay | Admitting: Vascular Surgery

## 2017-08-16 VITALS — BP 110/55 | HR 50 | Resp 17 | Ht 65.5 in | Wt 272.4 lb

## 2017-08-16 DIAGNOSIS — I10 Essential (primary) hypertension: Secondary | ICD-10-CM | POA: Diagnosis not present

## 2017-08-16 DIAGNOSIS — M25561 Pain in right knee: Secondary | ICD-10-CM | POA: Diagnosis not present

## 2017-08-16 DIAGNOSIS — I481 Persistent atrial fibrillation: Secondary | ICD-10-CM

## 2017-08-16 DIAGNOSIS — M25061 Hemarthrosis, right knee: Secondary | ICD-10-CM

## 2017-08-16 DIAGNOSIS — K219 Gastro-esophageal reflux disease without esophagitis: Secondary | ICD-10-CM | POA: Diagnosis not present

## 2017-08-16 DIAGNOSIS — I872 Venous insufficiency (chronic) (peripheral): Secondary | ICD-10-CM

## 2017-08-16 DIAGNOSIS — I83893 Varicose veins of bilateral lower extremities with other complications: Secondary | ICD-10-CM | POA: Diagnosis not present

## 2017-08-16 DIAGNOSIS — I4819 Other persistent atrial fibrillation: Secondary | ICD-10-CM

## 2017-08-17 ENCOUNTER — Encounter (INDEPENDENT_AMBULATORY_CARE_PROVIDER_SITE_OTHER): Payer: Self-pay | Admitting: Vascular Surgery

## 2017-08-17 ENCOUNTER — Other Ambulatory Visit (INDEPENDENT_AMBULATORY_CARE_PROVIDER_SITE_OTHER): Payer: Medicare Other

## 2017-08-17 ENCOUNTER — Other Ambulatory Visit (INDEPENDENT_AMBULATORY_CARE_PROVIDER_SITE_OTHER): Payer: Self-pay | Admitting: Vascular Surgery

## 2017-08-17 DIAGNOSIS — I83893 Varicose veins of bilateral lower extremities with other complications: Secondary | ICD-10-CM | POA: Insufficient documentation

## 2017-08-17 DIAGNOSIS — I872 Venous insufficiency (chronic) (peripheral): Secondary | ICD-10-CM | POA: Insufficient documentation

## 2017-08-17 DIAGNOSIS — I83891 Varicose veins of right lower extremities with other complications: Secondary | ICD-10-CM | POA: Diagnosis not present

## 2017-08-17 DIAGNOSIS — M25061 Hemarthrosis, right knee: Secondary | ICD-10-CM | POA: Insufficient documentation
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
## 2017-08-20 ENCOUNTER — Telehealth (INDEPENDENT_AMBULATORY_CARE_PROVIDER_SITE_OTHER): Payer: Self-pay | Admitting: Vascular Surgery

## 2017-08-20 NOTE — Telephone Encounter (Signed)
Patient called stating she got a message in mychart that [facility] ordered a vein thing for her. I tried to get a clear explanation of what it was but could not understand. She stated it was something with a cather..I advised her that we would look into it and call her back.

## 2017-08-23 NOTE — Telephone Encounter (Signed)
I would touch base with Dr. Delana Meyer for clarification when he gets in.

## 2017-08-24 ENCOUNTER — Other Ambulatory Visit (INDEPENDENT_AMBULATORY_CARE_PROVIDER_SITE_OTHER): Payer: Self-pay | Admitting: Vascular Surgery

## 2017-08-24 ENCOUNTER — Encounter (INDEPENDENT_AMBULATORY_CARE_PROVIDER_SITE_OTHER): Payer: Self-pay

## 2017-08-24 NOTE — Telephone Encounter (Signed)
As seen and read in the patients chart, the only suggestion was for the patient to maybe undergo an angio. After speaking with Maudie Mercury, she suggested that we check with Dr. Delana Meyer to make sure that that is all that he would like to do at this time for the patient.

## 2017-08-30 ENCOUNTER — Encounter
Admission: RE | Admit: 2017-08-30 | Discharge: 2017-08-30 | Disposition: A | Discharge status: Home / Self Care | Payer: Medicare Other | Source: Ambulatory Visit | Attending: Vascular Surgery | Admitting: Vascular Surgery

## 2017-08-30 ENCOUNTER — Other Ambulatory Visit: Payer: Self-pay

## 2017-08-30 DIAGNOSIS — I10 Essential (primary) hypertension: Secondary | ICD-10-CM | POA: Diagnosis not present

## 2017-08-30 DIAGNOSIS — Z881 Allergy status to other antibiotic agents status: Secondary | ICD-10-CM | POA: Diagnosis not present

## 2017-08-30 DIAGNOSIS — I872 Venous insufficiency (chronic) (peripheral): Secondary | ICD-10-CM | POA: Diagnosis present

## 2017-08-30 DIAGNOSIS — I770 Arteriovenous fistula, acquired: Secondary | ICD-10-CM | POA: Diagnosis present

## 2017-08-30 DIAGNOSIS — Z91013 Allergy to seafood: Secondary | ICD-10-CM | POA: Diagnosis not present

## 2017-08-30 DIAGNOSIS — Z885 Allergy status to narcotic agent status: Secondary | ICD-10-CM | POA: Diagnosis not present

## 2017-08-30 DIAGNOSIS — Z888 Allergy status to other drugs, medicaments and biological substances status: Secondary | ICD-10-CM | POA: Diagnosis not present

## 2017-08-30 DIAGNOSIS — I481 Persistent atrial fibrillation: Secondary | ICD-10-CM | POA: Diagnosis not present

## 2017-08-30 DIAGNOSIS — M25061 Hemarthrosis, right knee: Secondary | ICD-10-CM | POA: Diagnosis not present

## 2017-08-30 DIAGNOSIS — Z91048 Other nonmedicinal substance allergy status: Secondary | ICD-10-CM | POA: Diagnosis not present

## 2017-08-30 DIAGNOSIS — I739 Peripheral vascular disease, unspecified: Secondary | ICD-10-CM | POA: Diagnosis not present

## 2017-08-30 DIAGNOSIS — K219 Gastro-esophageal reflux disease without esophagitis: Secondary | ICD-10-CM | POA: Diagnosis not present

## 2017-08-30 DIAGNOSIS — Z96651 Presence of right artificial knee joint: Secondary | ICD-10-CM | POA: Diagnosis not present

## 2017-08-30 LAB — CREATININE, SERUM
CREATININE: 0.91 mg/dL (ref 0.44–1.00)
GFR calc Af Amer: >60 mL/min (ref >60)
GFR calc non Af Amer: >60 mL/min (ref >60)

## 2017-08-30 LAB — PROTIME-INR
INR: 1.85
Prothrombin Time: 21.2 seconds — ABNORMAL HIGH (ref 11.4–15.2)

## 2017-08-30 LAB — BUN: BUN: 17 mg/dL (ref 6–20)

## 2017-08-30 NOTE — Patient Instructions (Addendum)
[facility]  Follow instructions given to you by Dr. Delana Meyer  Your procedure is scheduled with Dr. Delana Meyer on August 31, 2017                            On arrival go [facility] on first floor of the [facility]. Your arrival time will be approximately 1-1.5 hours prior to you procedure start time. This allows time to check lab results and prep you for the procedure.  If your procedure time change you will be notified by the doctor's office.    _x____Do not eat or drink 8 hours prior to your procedure.    __x___Take all your morning medications with sips of water.  Please call Dr Lucky Cowboy and Dr Nino Parsley office with any questions or concerns: [phone].

## 2017-08-31 ENCOUNTER — Encounter
Admission: RE | Disposition: A | Discharge status: Home / Self Care | Payer: Self-pay | Source: Ambulatory Visit | Attending: Vascular Surgery

## 2017-08-31 ENCOUNTER — Encounter
Admission: RE | Admit: 2017-08-31 | Discharge: 2017-08-31 | Disposition: A | Discharge status: Home / Self Care | Payer: Medicare Other | Source: Ambulatory Visit | Attending: Internal Medicine | Admitting: Internal Medicine

## 2017-08-31 ENCOUNTER — Ambulatory Visit
Admission: RE | Admit: 2017-08-31 | Discharge: 2017-08-31 | Disposition: A | Discharge status: Home / Self Care | Payer: Medicare Other | Source: Ambulatory Visit | Attending: Vascular Surgery | Admitting: Vascular Surgery

## 2017-08-31 DIAGNOSIS — I872 Venous insufficiency (chronic) (peripheral): Secondary | ICD-10-CM | POA: Insufficient documentation

## 2017-08-31 DIAGNOSIS — I481 Persistent atrial fibrillation: Secondary | ICD-10-CM | POA: Insufficient documentation

## 2017-08-31 DIAGNOSIS — Z96651 Presence of right artificial knee joint: Secondary | ICD-10-CM | POA: Insufficient documentation

## 2017-08-31 DIAGNOSIS — Z91013 Allergy to seafood: Secondary | ICD-10-CM | POA: Insufficient documentation

## 2017-08-31 DIAGNOSIS — M25061 Hemarthrosis, right knee: Secondary | ICD-10-CM | POA: Diagnosis not present

## 2017-08-31 DIAGNOSIS — I739 Peripheral vascular disease, unspecified: Secondary | ICD-10-CM | POA: Insufficient documentation

## 2017-08-31 DIAGNOSIS — I770 Arteriovenous fistula, acquired: Secondary | ICD-10-CM | POA: Insufficient documentation

## 2017-08-31 DIAGNOSIS — K219 Gastro-esophageal reflux disease without esophagitis: Secondary | ICD-10-CM | POA: Insufficient documentation

## 2017-08-31 DIAGNOSIS — Z91048 Other nonmedicinal substance allergy status: Secondary | ICD-10-CM | POA: Insufficient documentation

## 2017-08-31 DIAGNOSIS — Z881 Allergy status to other antibiotic agents status: Secondary | ICD-10-CM | POA: Insufficient documentation

## 2017-08-31 DIAGNOSIS — Z885 Allergy status to narcotic agent status: Secondary | ICD-10-CM | POA: Insufficient documentation

## 2017-08-31 DIAGNOSIS — Z888 Allergy status to other drugs, medicaments and biological substances status: Secondary | ICD-10-CM | POA: Insufficient documentation

## 2017-08-31 DIAGNOSIS — I10 Essential (primary) hypertension: Secondary | ICD-10-CM | POA: Insufficient documentation

## 2017-08-31 HISTORY — PX: LOWER EXTREMITY ANGIOGRAPHY: CATH118251

## 2017-08-31 SURGERY — LOWER EXTREMITY ANGIOGRAPHY
Anesthesia: Moderate Sedation | Laterality: Right

## 2017-08-31 MED ORDER — FENTANYL CITRATE (PF) 100 MCG/2ML IJ SOLN
INTRAMUSCULAR | Status: DC | PRN
  Administered 2017-08-31 (×4): 50 ug via INTRAVENOUS

## 2017-08-31 MED ORDER — SODIUM CHLORIDE 0.9 % IV SOLN: 250.0000 mL | INTRAVENOUS | Status: DC | PRN

## 2017-08-31 MED ORDER — CLINDAMYCIN PHOSPHATE 300 MG/50ML IV SOLN
300.0000 mg | Freq: Once | INTRAVENOUS | Status: AC
  Administered 2017-08-31: 13:00:00 via INTRAVENOUS

## 2017-08-31 MED ORDER — MORPHINE SULFATE (PF) 4 MG/ML IV SOLN: 2.0000 mg | INTRAVENOUS | Status: DC | PRN

## 2017-08-31 MED ORDER — METHYLPREDNISOLONE SODIUM SUCC 125 MG IJ SOLR
125.0000 mg | INTRAMUSCULAR | Status: DC | PRN
  Administered 2017-08-31: 125 mg via INTRAVENOUS

## 2017-08-31 MED ORDER — LABETALOL HCL 5 MG/ML IV SOLN: 10.0000 mg | INTRAVENOUS | Status: DC | PRN

## 2017-08-31 MED ORDER — LIDOCAINE HCL (PF) 1 % IJ SOLN
INTRAMUSCULAR | Status: AC
  Filled 2017-08-31: qty 30

## 2017-08-31 MED ORDER — HYDRALAZINE HCL 20 MG/ML IJ SOLN: 5.0000 mg | INTRAMUSCULAR | Status: DC | PRN

## 2017-08-31 MED ORDER — FENTANYL CITRATE (PF) 100 MCG/2ML IJ SOLN
INTRAMUSCULAR | Status: AC
  Filled 2017-08-31: qty 2

## 2017-08-31 MED ORDER — DIPHENHYDRAMINE HCL 50 MG/ML IJ SOLN: 25.0000 mg | Freq: Once | INTRAMUSCULAR | Status: DC

## 2017-08-31 MED ORDER — SODIUM CHLORIDE 0.9% FLUSH: 3.0000 mL | INTRAVENOUS | Status: DC | PRN

## 2017-08-31 MED ORDER — METHYLPREDNISOLONE SODIUM SUCC 125 MG IJ SOLR
INTRAMUSCULAR | Status: AC
  Administered 2017-08-31: 125 mg via INTRAVENOUS
  Filled 2017-08-31: qty 2

## 2017-08-31 MED ORDER — SODIUM CHLORIDE 0.9% FLUSH: 3.0000 mL | Freq: Two times a day (BID) | INTRAVENOUS | Status: DC

## 2017-08-31 MED ORDER — FAMOTIDINE 20 MG PO TABS
40.0000 mg | ORAL_TABLET | ORAL | Status: DC | PRN
  Administered 2017-08-31: 40 mg via ORAL

## 2017-08-31 MED ORDER — MIDAZOLAM HCL 2 MG/2ML IJ SOLN
INTRAMUSCULAR | Status: DC | PRN
  Administered 2017-08-31: 1 mg via INTRAVENOUS
  Administered 2017-08-31 (×3): 2 mg via INTRAVENOUS

## 2017-08-31 MED ORDER — CLINDAMYCIN PHOSPHATE 300 MG/50ML IV SOLN
INTRAVENOUS | Status: AC
  Filled 2017-08-31: qty 50

## 2017-08-31 MED ORDER — OXYCODONE HCL 5 MG PO TABS: 5.0000 mg | ORAL_TABLET | ORAL | Status: DC | PRN

## 2017-08-31 MED ORDER — HEPARIN SODIUM (PORCINE) 1000 UNIT/ML IJ SOLN
INTRAMUSCULAR | Status: DC | PRN
  Administered 2017-08-31: 3000 [IU] via INTRAVENOUS

## 2017-08-31 MED ORDER — SODIUM CHLORIDE 0.9 % IV SOLN: INTRAVENOUS | Status: DC

## 2017-08-31 MED ORDER — IOPAMIDOL (ISOVUE-300) INJECTION 61%
INTRAVENOUS | Status: DC | PRN
  Administered 2017-08-31: 75 mL via INTRAVENOUS

## 2017-08-31 MED ORDER — MIDAZOLAM HCL 5 MG/5ML IJ SOLN
INTRAMUSCULAR | Status: AC
  Filled 2017-08-31: qty 5

## 2017-08-31 MED ORDER — HEPARIN SODIUM (PORCINE) 1000 UNIT/ML IJ SOLN
INTRAMUSCULAR | Status: AC
  Filled 2017-08-31: qty 1

## 2017-08-31 MED ORDER — SODIUM CHLORIDE 0.9 % IV SOLN
INTRAVENOUS | Status: DC
  Administered 2017-08-31: 1000 mL via INTRAVENOUS

## 2017-08-31 MED ORDER — ONDANSETRON HCL 4 MG/2ML IJ SOLN
INTRAMUSCULAR | Status: AC
  Administered 2017-08-31: 4 mg via INTRAVENOUS
  Filled 2017-08-31: qty 2

## 2017-08-31 MED ORDER — HEPARIN (PORCINE) IN NACL 2-0.9 UNIT/ML-% IJ SOLN
INTRAMUSCULAR | Status: AC
  Filled 2017-08-31: qty 1000

## 2017-08-31 MED ORDER — FAMOTIDINE 20 MG PO TABS
ORAL_TABLET | ORAL | Status: AC
  Administered 2017-08-31: 40 mg via ORAL
  Filled 2017-08-31: qty 2

## 2017-08-31 MED ORDER — HYDROMORPHONE HCL 1 MG/ML IJ SOLN: 1.0000 mg | Freq: Once | INTRAMUSCULAR | Status: DC | PRN

## 2017-08-31 MED ORDER — ONDANSETRON HCL 4 MG/2ML IJ SOLN
4.0000 mg | Freq: Four times a day (QID) | INTRAMUSCULAR | Status: DC | PRN
  Administered 2017-08-31: 4 mg via INTRAVENOUS

## 2017-08-31 SURGICAL SUPPLY — 24 items
CATH BEACON 5 .038 100 VERT TP (CATHETERS) ×6 IMPLANT
CATH MICROCATH PRGRT 2.8F 110 (CATHETERS) ×1 IMPLANT
CATH MICROCATH PRGRT 2.8F 130 (MICROCATHETER) ×1 IMPLANT
CATH PIG 70CM (CATHETERS) ×3 IMPLANT
COIL 400 COMPLEX SOFT 2X2CM (Vascular Products) ×3 IMPLANT
COIL 400 COMPLEX SOFT 2X4CM (Vascular Products) ×3 IMPLANT
COIL 400 COMPLEX SOFT 3X5CM (Vascular Products) ×3 IMPLANT
COIL 400 COMPLEX STD 3X5CM (Vascular Products) ×3 IMPLANT
DEVICE STARCLOSE SE CLOSURE (Vascular Products) ×3 IMPLANT
DEVICE TORQUE (MISCELLANEOUS) ×3 IMPLANT
GLIDEWIRE ADV .014X300CM (WIRE) ×3 IMPLANT
GUIDEWIRE LT ZIPWIRE 035X260 (WIRE) ×3 IMPLANT
HANDLE DETACHMENT COIL (MISCELLANEOUS) ×3 IMPLANT
MICROCATH PROGREAT 2.8F 110 CM (CATHETERS) ×3
MICROCATH PROGREAT 2.8F 130CM (MICROCATHETER) ×3
NEEDLE ENTRY 21GA 7CM ECHOTIP (NEEDLE) ×3 IMPLANT
PACK ANGIOGRAPHY (CUSTOM PROCEDURE TRAY) ×3 IMPLANT
SET INTRO CAPELLA COAXIAL (SET/KITS/TRAYS/PACK) ×3 IMPLANT
SHEATH BRITE TIP 5FRX11 (SHEATH) ×3 IMPLANT
SHEATH RAABE 6FR (SHEATH) ×3 IMPLANT
SHIELD RADPAD SCOOP 12X17 (MISCELLANEOUS) ×3 IMPLANT
TUBING CONTRAST HIGH PRESS 72 (TUBING) ×3 IMPLANT
WIRE J 3MM .035X145CM (WIRE) ×3 IMPLANT
WIRE MAGIC TORQUE 260C (WIRE) ×3 IMPLANT

## 2017-08-31 NOTE — Op Note (Signed)
OPERATIVE NOTE   PROCEDURE: 1. Right lower extremity angiography third order catheter placement with additional third order catheter placement 2. Ultrasound-guided access left common femoral artery for sheath placement 3. Coil embolization of a right geniculate artery 4. 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  ANESTHESIA: Conscious sedation was administered under my direct supervision by the interventional radiology RN. IV Versed plus fentanyl were utilized. Continuous ECG, pulse oximetry and blood pressure was monitored throughout the entire procedure.  Conscious sedation was for a total of 132 minutes.  ESTIMATED BLOOD LOSS: Minimal cc  FINDING(S): 1.  Arteriovenous fistula noted posterior to the total knee prosthesis.  Injection of contrast at the level of the mid SFA resulted in a very intense blush adjacent to the prosthesis with rapid filling of the superficial femoral vein.  Sheath: 6 French Raby left common femoral  Contrast: 75 cc  Fluoroscopy Time: 28.8 minutes   INDICATIONS:   Laura Gonzalez is a 77 y.o. y.o. female who presents with multiple episodes of hemarthrosis right knee.  She is status post right total knee replacement in the past.  Since that surgery she has had increasing difficulties with swelling of the knee joint.  Swelling has been associated with significant hemorrhage when aspirated consistent with a hemarthrosis.  This is raised the concern of a vascular anomaly associated with this situation and I was asked to evaluate.  Risks and benefits were reviewed all questions answered patient has agreed to proceed.   DESCRIPTION: After obtaining full informed written consent, the patient was brought back to the operating room and placed  supine upon the operating table.  The patient received IV antibiotics prior to induction.  After obtaining adequate sedation, the patient was prepped and draped in the standard fashion and appropriate time out is called.    Ultrasound is placed in a sterile sleeve ultrasound as utilized secondary to lack of appropriate landmarks and to avoid vascular injury. Under real-time visualization the left common femoral artery is identified is echolucent and pulsatile indicating patency. Image is recorded for the permanent record. 1% lidocaine is then infiltrated into the soft tissues with ultrasound visualization. Microneedle is then inserted into the anterior wall of the common femoral artery under direct visualization with ultrasound. Microwire followed by micro-sheath.   J-wire followed by 6 French sheath is then inserted without difficulty.  J-wire is then advanced with pigtail catheter up to the level of T12 and AP projection of the aorta is obtained. Pigtail catheter was repositioned to above the bifurcation and oblique view of the pelvis is obtained.  Using a Stiff angled Glide Wire wire and the pigtail catheter the aortic bifurcation was crossed and the catheter is advanced down to the external iliac artery. Oblique view of the femoral bifurcation is then obtained by hand injection. The wire is then reintroduced and the catheter is positioned within the SFA. AP projections of the right lower extremity are obtained for distal runoff.  After review the images distal images are not adequate and the wire is reintroduced a 6 [place] Raby sheath is advanced up and over the bifurcation and positioned with its tip in the superficial femoral artery and subsequently a 100 cm Kumpe catheter is advanced down to the mid SFA.  The patient's leg is then repositioned in a flexed frog-leg view and further imaging  of the SFA branches and geniculate vessels is then obtained.  3000 units of heparin is given and allowed to  circulate.  On review of these magnified images there is clearly an intense blush it is associated with the posterior aspect of the prosthesis.  This blush then rapidly fills the superficial femoral vein.  This is consistent with a traumatic AV fistula.  Given her multiple hemorrhagic events coil embolization is appropriate to try to eliminate this abnormality.  Both a 100 length pro-grate and a 130 length pro-grate were used during the case.  On magnified imaging there appeared to be 3 branches associated with the filling of the AV fistula.  One is clearly associated with a geniculate branch.  The other 2 appear to be direct branches off the SFA with one being approximately 2 mm in diameter and the other being even smaller and contributing significantly less.  Initially the SFA branch that is 2 mm was addressed the pro-grate wire and catheter were used to select the branch and the Kumpe was advanced down to the origin.  2 Ruby coils were then placed both were 2 mm in diameter one measuring 2 cm in length and the second measuring 4 cm in length.  Follow-up imaging through the pro-great catheter demonstrated complete cessation of flow and already there was a significant decrease in the blush from the AV fistula.  I then selected the geniculate branch with a pro-great catheter advanced the wire and pro-great catheter and then was able to advance the Kumpe catheter and seat the tip of the catheter within the branch.  Again 2 coils were utilized a 3 mm Ruby coil followed by a 4 mm Ruby coils.  Follow-up imaging demonstrated successful exclusion of this branch and there is now almost no filling of the vascular abnormality.  I then attempted to engage the third smallest branch.  Although I was able to select this with a wire was not able to advance a catheter to the point where coil embolization could safely be performed.  Given the tremendous reduction in flow within the AV fistula I felt we had achieved  significant gain and elected to terminate the procedure at this time.  After review of the images the catheter is removed sheath is pulled back into the left iliac system J-wire is then advanced and socially and LAO projection of the groin is obtained after review of this image a Star Cose device is deployed without difficulty.  INTERPRETATION: The abdominal aorta is opacified with a bolus injection of contrast. There is no significant atherosclerotic changes visible in the aorta, bilateral common iliac arteries and bilateral external iliac arteries.  The right common femoral and profunda femoris arteries are widely patent. The right superficial femoral artery is widely patent as well.  With the Kumpe catheter positioned in the mid SFA magnified images of the knee in a flexed position demonstrate the vascular abnormality clearly this is a arteriovenous shunt with rapid filling of the venous system on injection.  As noted above there appeared to be 3 branches contributing to this area the 2 largest branches were selected individually and then embolized separately.  The third branch was not treated at this time.  SUMMARY: Successful embolization of an arterial venous fistula/malformation  COMPLICATIONS: There were no immediate consultations.  CONDITION: Laura Gonzalez, M.D. [facility] Office: [phone]   08/31/2017,6:13 PM

## 2017-08-31 NOTE — H&P (Signed)
[facility] History & Physical Update  The patient was interviewed and re-examined.  The patient's previous History and Physical has been reviewed and is unchanged.  There is no change in the plan of care. We plan to proceed with the scheduled procedure.  Hortencia Pilar, MD  08/31/2017, 11:45 AM

## 2017-09-01 ENCOUNTER — Encounter: Payer: Self-pay | Admitting: Vascular Surgery

## 2017-09-20 ENCOUNTER — Ambulatory Visit (INDEPENDENT_AMBULATORY_CARE_PROVIDER_SITE_OTHER): Payer: Medicare Other | Admitting: Vascular Surgery

## 2017-09-20 ENCOUNTER — Encounter (INDEPENDENT_AMBULATORY_CARE_PROVIDER_SITE_OTHER): Payer: Self-pay | Admitting: Vascular Surgery

## 2017-09-20 VITALS — BP 129/75 | HR 68 | Resp 17 | Ht 67.0 in | Wt 274.0 lb

## 2017-09-20 DIAGNOSIS — M25061 Hemarthrosis, right knee: Secondary | ICD-10-CM | POA: Diagnosis not present

## 2017-09-20 DIAGNOSIS — I872 Venous insufficiency (chronic) (peripheral): Secondary | ICD-10-CM | POA: Diagnosis not present

## 2017-09-20 DIAGNOSIS — I10 Essential (primary) hypertension: Secondary | ICD-10-CM

## 2017-09-20 DIAGNOSIS — I7789 Other specified disorders of arteries and arterioles: Secondary | ICD-10-CM | POA: Diagnosis not present

## 2017-09-20 DIAGNOSIS — K219 Gastro-esophageal reflux disease without esophagitis: Secondary | ICD-10-CM

## 2017-09-20 DIAGNOSIS — I4819 Other persistent atrial fibrillation: Secondary | ICD-10-CM

## 2017-09-20 DIAGNOSIS — I481 Persistent atrial fibrillation: Secondary | ICD-10-CM | POA: Diagnosis not present
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
## 2017-09-22 ENCOUNTER — Encounter (INDEPENDENT_AMBULATORY_CARE_PROVIDER_SITE_OTHER): Payer: Self-pay | Admitting: Vascular Surgery

## 2017-09-22 DIAGNOSIS — I739 Peripheral vascular disease, unspecified: Secondary | ICD-10-CM | POA: Insufficient documentation

## 2017-09-22 DIAGNOSIS — I7789 Other specified disorders of arteries and arterioles: Secondary | ICD-10-CM | POA: Insufficient documentation

## 2018-03-24 ENCOUNTER — Ambulatory Visit (INDEPENDENT_AMBULATORY_CARE_PROVIDER_SITE_OTHER): Payer: Medicare Other

## 2018-03-24 ENCOUNTER — Ambulatory Visit (INDEPENDENT_AMBULATORY_CARE_PROVIDER_SITE_OTHER): Payer: Medicare Other | Admitting: Vascular Surgery

## 2018-03-24 ENCOUNTER — Encounter (INDEPENDENT_AMBULATORY_CARE_PROVIDER_SITE_OTHER): Payer: Self-pay | Admitting: Vascular Surgery

## 2018-03-24 VITALS — BP 126/79 | HR 75 | Resp 16 | Ht 65.5 in | Wt 285.0 lb

## 2018-03-24 DIAGNOSIS — I7789 Other specified disorders of arteries and arterioles: Secondary | ICD-10-CM

## 2018-03-24 DIAGNOSIS — K219 Gastro-esophageal reflux disease without esophagitis: Secondary | ICD-10-CM

## 2018-03-24 DIAGNOSIS — M25561 Pain in right knee: Secondary | ICD-10-CM | POA: Diagnosis not present

## 2018-03-24 DIAGNOSIS — I739 Peripheral vascular disease, unspecified: Secondary | ICD-10-CM

## 2018-03-24 DIAGNOSIS — I4819 Other persistent atrial fibrillation: Secondary | ICD-10-CM

## 2018-03-24 DIAGNOSIS — I10 Essential (primary) hypertension: Secondary | ICD-10-CM | POA: Diagnosis not present

## 2018-03-24 DIAGNOSIS — I872 Venous insufficiency (chronic) (peripheral): Secondary | ICD-10-CM | POA: Diagnosis not present
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
## 2018-04-06 ENCOUNTER — Inpatient Hospital Stay
Admission: EM | Admit: 2018-04-06 | Discharge: 2018-04-12 | DRG: 871 | Disposition: A | Discharge status: Home Health | Payer: Medicare Other | Attending: Internal Medicine | Admitting: Internal Medicine

## 2018-04-06 ENCOUNTER — Emergency Department: Payer: Medicare Other

## 2018-04-06 ENCOUNTER — Other Ambulatory Visit: Payer: Self-pay

## 2018-04-06 ENCOUNTER — Encounter: Payer: Self-pay | Admitting: Emergency Medicine

## 2018-04-06 DIAGNOSIS — M1712 Unilateral primary osteoarthritis, left knee: Secondary | ICD-10-CM | POA: Diagnosis present

## 2018-04-06 DIAGNOSIS — A419 Sepsis, unspecified organism: Secondary | ICD-10-CM | POA: Diagnosis present

## 2018-04-06 DIAGNOSIS — I4819 Other persistent atrial fibrillation: Secondary | ICD-10-CM | POA: Diagnosis present

## 2018-04-06 DIAGNOSIS — Z7983 Long term (current) use of bisphosphonates: Secondary | ICD-10-CM

## 2018-04-06 DIAGNOSIS — J45901 Unspecified asthma with (acute) exacerbation: Secondary | ICD-10-CM | POA: Diagnosis present

## 2018-04-06 DIAGNOSIS — J9601 Acute respiratory failure with hypoxia: Secondary | ICD-10-CM | POA: Diagnosis present

## 2018-04-06 DIAGNOSIS — Z7951 Long term (current) use of inhaled steroids: Secondary | ICD-10-CM | POA: Diagnosis not present

## 2018-04-06 DIAGNOSIS — R05 Cough: Secondary | ICD-10-CM

## 2018-04-06 DIAGNOSIS — Z806 Family history of leukemia: Secondary | ICD-10-CM

## 2018-04-06 DIAGNOSIS — I4892 Unspecified atrial flutter: Secondary | ICD-10-CM | POA: Diagnosis present

## 2018-04-06 DIAGNOSIS — Z91013 Allergy to seafood: Secondary | ICD-10-CM

## 2018-04-06 DIAGNOSIS — Z91048 Other nonmedicinal substance allergy status: Secondary | ICD-10-CM | POA: Diagnosis not present

## 2018-04-06 DIAGNOSIS — I110 Hypertensive heart disease with heart failure: Secondary | ICD-10-CM | POA: Diagnosis present

## 2018-04-06 DIAGNOSIS — Z800 Family history of malignant neoplasm of digestive organs: Secondary | ICD-10-CM

## 2018-04-06 DIAGNOSIS — Z85828 Personal history of other malignant neoplasm of skin: Secondary | ICD-10-CM | POA: Diagnosis not present

## 2018-04-06 DIAGNOSIS — Z8719 Personal history of other diseases of the digestive system: Secondary | ICD-10-CM | POA: Diagnosis not present

## 2018-04-06 DIAGNOSIS — R059 Cough, unspecified: Secondary | ICD-10-CM

## 2018-04-06 DIAGNOSIS — I5033 Acute on chronic diastolic (congestive) heart failure: Secondary | ICD-10-CM | POA: Diagnosis present

## 2018-04-06 DIAGNOSIS — Z888 Allergy status to other drugs, medicaments and biological substances status: Secondary | ICD-10-CM

## 2018-04-06 DIAGNOSIS — Z8261 Family history of arthritis: Secondary | ICD-10-CM | POA: Diagnosis not present

## 2018-04-06 DIAGNOSIS — Z7982 Long term (current) use of aspirin: Secondary | ICD-10-CM | POA: Diagnosis not present

## 2018-04-06 DIAGNOSIS — J209 Acute bronchitis, unspecified: Secondary | ICD-10-CM | POA: Diagnosis present

## 2018-04-06 DIAGNOSIS — Z8249 Family history of ischemic heart disease and other diseases of the circulatory system: Secondary | ICD-10-CM | POA: Diagnosis not present

## 2018-04-06 DIAGNOSIS — Z881 Allergy status to other antibiotic agents status: Secondary | ICD-10-CM

## 2018-04-06 DIAGNOSIS — I10 Essential (primary) hypertension: Secondary | ICD-10-CM | POA: Diagnosis present

## 2018-04-06 DIAGNOSIS — I480 Paroxysmal atrial fibrillation: Secondary | ICD-10-CM | POA: Diagnosis present

## 2018-04-06 DIAGNOSIS — E782 Mixed hyperlipidemia: Secondary | ICD-10-CM | POA: Diagnosis present

## 2018-04-06 DIAGNOSIS — M810 Age-related osteoporosis without current pathological fracture: Secondary | ICD-10-CM | POA: Diagnosis present

## 2018-04-06 DIAGNOSIS — I4891 Unspecified atrial fibrillation: Secondary | ICD-10-CM | POA: Diagnosis present

## 2018-04-06 DIAGNOSIS — Z96651 Presence of right artificial knee joint: Secondary | ICD-10-CM | POA: Diagnosis present

## 2018-04-06 DIAGNOSIS — K219 Gastro-esophageal reflux disease without esophagitis: Secondary | ICD-10-CM | POA: Diagnosis present

## 2018-04-06 DIAGNOSIS — Z803 Family history of malignant neoplasm of breast: Secondary | ICD-10-CM

## 2018-04-06 DIAGNOSIS — Z808 Family history of malignant neoplasm of other organs or systems: Secondary | ICD-10-CM

## 2018-04-06 DIAGNOSIS — J189 Pneumonia, unspecified organism: Secondary | ICD-10-CM | POA: Diagnosis present

## 2018-04-06 DIAGNOSIS — Z981 Arthrodesis status: Secondary | ICD-10-CM | POA: Diagnosis not present

## 2018-04-06 DIAGNOSIS — G4730 Sleep apnea, unspecified: Secondary | ICD-10-CM | POA: Diagnosis present

## 2018-04-06 LAB — COMPREHENSIVE METABOLIC PANEL
ALK PHOS: 66 U/L (ref 38–126)
ALT: 32 U/L (ref 0–44)
ANION GAP: 9 (ref 5–15)
AST: 29 U/L (ref 15–41)
Albumin: 3.8 g/dL (ref 3.5–5.0)
BILIRUBIN TOTAL: 0.7 mg/dL (ref 0.3–1.2)
BUN: 15 mg/dL (ref 8–23)
CALCIUM: 9.8 mg/dL (ref 8.9–10.3)
CO2: 26 mmol/L (ref 22–32)
Chloride: 104 mmol/L (ref 98–111)
Creatinine, Ser: 0.76 mg/dL (ref 0.44–1.00)
GFR calc Af Amer: >60 mL/min (ref >60)
GFR calc non Af Amer: >60 mL/min (ref >60)
Glucose, Bld: 121 mg/dL — ABNORMAL HIGH (ref 70–99)
Potassium: 4.1 mmol/L (ref 3.5–5.1)
Sodium: 139 mmol/L (ref 135–145)
TOTAL PROTEIN: 7.1 g/dL (ref 6.5–8.1)

## 2018-04-06 LAB — CBC WITH DIFFERENTIAL/PLATELET
Abs Immature Granulocytes: 0.07 10*3/uL (ref 0.00–0.07)
Basophils Absolute: 0 10*3/uL (ref 0.0–0.1)
Basophils Relative: 0 %
EOS ABS: 0 10*3/uL (ref 0.0–0.5)
EOS PCT: 0 %
HEMATOCRIT: 47.3 % — AB (ref 36.0–46.0)
Hemoglobin: 15.4 g/dL — ABNORMAL HIGH (ref 12.0–15.0)
Immature Granulocytes: 1 %
LYMPHS ABS: 0.9 10*3/uL (ref 0.7–4.0)
Lymphocytes Relative: 6 %
MCH: 30.7 pg (ref 26.0–34.0)
MCHC: 32.6 g/dL (ref 30.0–36.0)
MCV: 94.4 fL (ref 80.0–100.0)
MONOS PCT: 9 %
Monocytes Absolute: 1.3 10*3/uL — ABNORMAL HIGH (ref 0.1–1.0)
Neutro Abs: 12.9 10*3/uL — ABNORMAL HIGH (ref 1.7–7.7)
Neutrophils Relative %: 84 %
Platelets: 243 10*3/uL (ref 150–400)
RBC: 5.01 MIL/uL (ref 3.87–5.11)
RDW: 13.8 % (ref 11.5–15.5)
WBC: 15.1 10*3/uL — ABNORMAL HIGH (ref 4.0–10.5)
nRBC: 0 % (ref 0.0–0.2)

## 2018-04-06 LAB — URINALYSIS, COMPLETE (UACMP) WITH MICROSCOPIC
Bilirubin Urine: NEGATIVE
Glucose, UA: NEGATIVE mg/dL
HGB URINE DIPSTICK: NEGATIVE
Ketones, ur: NEGATIVE mg/dL
LEUKOCYTES UA: NEGATIVE
NITRITE: NEGATIVE
PH: 6 (ref 5.0–8.0)
Protein, ur: NEGATIVE mg/dL
SPECIFIC GRAVITY, URINE: 1.018 (ref 1.005–1.030)

## 2018-04-06 LAB — LIPASE, BLOOD: Lipase: 26 U/L (ref 11–51)

## 2018-04-06 LAB — TROPONIN I

## 2018-04-06 LAB — LACTIC ACID, PLASMA: Lactic Acid, Venous: 1.8 mmol/L (ref 0.5–1.9)

## 2018-04-06 LAB — PROTIME-INR
INR: 2.34
PROTHROMBIN TIME: 25.3 s — AB (ref 11.4–15.2)

## 2018-04-06 LAB — MAGNESIUM: MAGNESIUM: 2 mg/dL (ref 1.7–2.4)

## 2018-04-06 MED ORDER — ADENOSINE 6 MG/2ML IV SOLN
6.0000 mg | Freq: Once | INTRAVENOUS | Status: AC
  Administered 2018-04-06: 6 mg via INTRAVENOUS
  Filled 2018-04-06: qty 2

## 2018-04-06 MED ORDER — DILTIAZEM HCL 60 MG PO TABS: 30.0000 mg | ORAL_TABLET | Freq: Once | ORAL | Status: DC

## 2018-04-06 MED ORDER — SODIUM CHLORIDE 0.9 % IV SOLN
1.0000 g | Freq: Three times a day (TID) | INTRAVENOUS | Status: DC
  Administered 2018-04-06 – 2018-04-08 (×5): 1 g via INTRAVENOUS
  Filled 2018-04-06 (×7): qty 1

## 2018-04-06 MED ORDER — DILTIAZEM HCL 25 MG/5ML IV SOLN
10.0000 mg | Freq: Once | INTRAVENOUS | Status: AC
  Administered 2018-04-06: 10 mg via INTRAVENOUS
  Filled 2018-04-06: qty 5

## 2018-04-06 MED ORDER — SODIUM CHLORIDE 0.9 % IV BOLUS
500.0000 mL | Freq: Once | INTRAVENOUS | Status: AC
  Administered 2018-04-06: 500 mL via INTRAVENOUS

## 2018-04-06 NOTE — ED Notes (Signed)
Pharmacy called to send [place].
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
## 2018-04-06 NOTE — ED Triage Notes (Signed)
Pt to ED via [facility] from the [facility], with complaints of Chest pain, SOB and palpitations. Pt reports that she woke up feeling like "crap". She states that her the middle of her chest is hurting and goes into her back, she reports this how her pneumonia usually starts.
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
## 2018-04-07 ENCOUNTER — Inpatient Hospital Stay
Admit: 2018-04-07 | Discharge: 2018-04-07 | Disposition: A | Discharge status: Home / Self Care | Payer: Medicare Other | Attending: Internal Medicine | Admitting: Internal Medicine

## 2018-04-07 LAB — PROCALCITONIN: Procalcitonin: <0.1 ng/mL

## 2018-04-07 LAB — BASIC METABOLIC PANEL
Anion gap: 9 (ref 5–15)
BUN: 13 mg/dL (ref 8–23)
CHLORIDE: 103 mmol/L (ref 98–111)
CO2: 26 mmol/L (ref 22–32)
Calcium: 8.6 mg/dL — ABNORMAL LOW (ref 8.9–10.3)
Creatinine, Ser: 0.71 mg/dL (ref 0.44–1.00)
GFR calc Af Amer: >60 mL/min (ref >60)
GFR calc non Af Amer: >60 mL/min (ref >60)
Glucose, Bld: 132 mg/dL — ABNORMAL HIGH (ref 70–99)
Potassium: 3.9 mmol/L (ref 3.5–5.1)
SODIUM: 138 mmol/L (ref 135–145)

## 2018-04-07 LAB — CBC
HCT: 42.4 % (ref 36.0–46.0)
Hemoglobin: 13.6 g/dL (ref 12.0–15.0)
MCH: 30.8 pg (ref 26.0–34.0)
MCHC: 32.1 g/dL (ref 30.0–36.0)
MCV: 96.1 fL (ref 80.0–100.0)
Platelets: 220 10*3/uL (ref 150–400)
RBC: 4.41 MIL/uL (ref 3.87–5.11)
RDW: 13.8 % (ref 11.5–15.5)
WBC: 13 10*3/uL — ABNORMAL HIGH (ref 4.0–10.5)
nRBC: 0 % (ref 0.0–0.2)

## 2018-04-07 LAB — PROTIME-INR
INR: 2.1
Prothrombin Time: 23.3 seconds — ABNORMAL HIGH (ref 11.4–15.2)

## 2018-04-07 LAB — ECHOCARDIOGRAM COMPLETE
Height: 65 in
Weight: 4595.2 oz

## 2018-04-07 MED ORDER — MONTELUKAST SODIUM 10 MG PO TABS
10.0000 mg | ORAL_TABLET | Freq: Every day | ORAL | Status: DC
  Administered 2018-04-07 – 2018-04-11 (×6): 10 mg via ORAL
  Filled 2018-04-07 (×7): qty 1

## 2018-04-07 MED ORDER — ACETAMINOPHEN 650 MG RE SUPP: 650.0000 mg | Freq: Four times a day (QID) | RECTAL | Status: DC | PRN

## 2018-04-07 MED ORDER — WARFARIN - PHARMACIST DOSING INPATIENT
Freq: Every day | Status: DC
  Administered 2018-04-07 – 2018-04-10 (×3)

## 2018-04-07 MED ORDER — ACETAMINOPHEN 325 MG PO TABS
650.0000 mg | ORAL_TABLET | Freq: Four times a day (QID) | ORAL | Status: DC | PRN
  Administered 2018-04-07 – 2018-04-08 (×4): 650 mg via ORAL
  Filled 2018-04-07 (×3): qty 2

## 2018-04-07 MED ORDER — MAGNESIUM OXIDE 400 (241.3 MG) MG PO TABS
800.0000 mg | ORAL_TABLET | Freq: Every day | ORAL | Status: DC
  Administered 2018-04-07 – 2018-04-11 (×5): 800 mg via ORAL
  Filled 2018-04-07 (×5): qty 2

## 2018-04-07 MED ORDER — WARFARIN SODIUM 4 MG PO TABS
4.0000 mg | ORAL_TABLET | Freq: Every day | ORAL | Status: DC
  Administered 2018-04-07: 4 mg via ORAL
  Filled 2018-04-07: qty 1

## 2018-04-07 MED ORDER — METOPROLOL TARTRATE 25 MG PO TABS
25.0000 mg | ORAL_TABLET | Freq: Two times a day (BID) | ORAL | Status: DC
  Administered 2018-04-07 – 2018-04-11 (×8): 25 mg via ORAL
  Filled 2018-04-07 (×8): qty 1

## 2018-04-07 MED ORDER — DILTIAZEM HCL ER COATED BEADS 180 MG PO CP24
300.0000 mg | ORAL_CAPSULE | Freq: Every day | ORAL | Status: DC
  Administered 2018-04-08 – 2018-04-12 (×5): 300 mg via ORAL
  Filled 2018-04-07 (×5): qty 1

## 2018-04-07 MED ORDER — IPRATROPIUM-ALBUTEROL 0.5-2.5 (3) MG/3ML IN SOLN
3.0000 mL | Freq: Four times a day (QID) | RESPIRATORY_TRACT | Status: DC
  Administered 2018-04-07 – 2018-04-12 (×19): 3 mL via RESPIRATORY_TRACT
  Filled 2018-04-07 (×19): qty 3

## 2018-04-07 MED ORDER — SODIUM CHLORIDE 0.9 % IV SOLN
INTRAVENOUS | Status: AC
  Administered 2018-04-07: 03:00:00 via INTRAVENOUS

## 2018-04-07 MED ORDER — PREDNISONE 50 MG PO TABS
50.0000 mg | ORAL_TABLET | Freq: Every day | ORAL | Status: DC
  Administered 2018-04-07 – 2018-04-11 (×5): 50 mg via ORAL
  Filled 2018-04-07 (×5): qty 1

## 2018-04-07 MED ORDER — ZINC SULFATE 220 (50 ZN) MG PO CAPS
220.0000 mg | ORAL_CAPSULE | Freq: Every day | ORAL | Status: DC
  Administered 2018-04-08 – 2018-04-12 (×5): 220 mg via ORAL
  Filled 2018-04-07 (×6): qty 1

## 2018-04-07 MED ORDER — EZETIMIBE 10 MG PO TABS
10.0000 mg | ORAL_TABLET | Freq: Every day | ORAL | Status: DC
  Administered 2018-04-07 – 2018-04-11 (×5): 10 mg via ORAL
  Filled 2018-04-07 (×5): qty 1

## 2018-04-07 MED ORDER — ONDANSETRON HCL 4 MG/2ML IJ SOLN: 4.0000 mg | Freq: Four times a day (QID) | INTRAMUSCULAR | Status: DC | PRN

## 2018-04-07 MED ORDER — ASPIRIN EC 81 MG PO TBEC: 324.0000 mg | DELAYED_RELEASE_TABLET | Freq: Every day | ORAL | Status: DC | PRN

## 2018-04-07 MED ORDER — DILTIAZEM HCL 30 MG PO TABS
90.0000 mg | ORAL_TABLET | Freq: Once | ORAL | Status: AC
  Administered 2018-04-08: 90 mg via ORAL
  Filled 2018-04-07: qty 3

## 2018-04-07 MED ORDER — ONDANSETRON HCL 4 MG PO TABS: 4.0000 mg | ORAL_TABLET | Freq: Four times a day (QID) | ORAL | Status: DC | PRN

## 2018-04-07 MED ORDER — CALCIUM CARBONATE-VITAMIN D 500-200 MG-UNIT PO TABS
1.0000 | ORAL_TABLET | Freq: Two times a day (BID) | ORAL | Status: DC
  Administered 2018-04-07 – 2018-04-12 (×10): 1 via ORAL
  Filled 2018-04-07 (×10): qty 1

## 2018-04-07 NOTE — Progress Notes (Addendum)
[place] at [place] NAME: Laura Gonzalez    MR#:  [id number]  DATE OF BIRTH:  1940/12/04  SUBJECTIVE:   States she is feeling a little bit better this morning. SOB has improved. Denies chest pain or palpitations.  REVIEW OF SYSTEMS:  Review of Systems  Constitutional: Negative for chills and fever.  HENT: Negative for congestion and sore throat.   Eyes: Negative for blurred vision and double vision.  Respiratory: Positive for shortness of breath and wheezing. Negative for cough.   Cardiovascular: Negative for chest pain, palpitations and leg swelling.  Gastrointestinal: Negative for abdominal pain, nausea and vomiting.  Genitourinary: Negative for dysuria and frequency.  Musculoskeletal: Negative for back pain and myalgias.  Neurological: Negative for dizziness and headaches.  Psychiatric/Behavioral: Negative for depression. The patient is not nervous/anxious.     DRUG ALLERGIES:   Allergies  Allergen Reactions  . Amoxicillin Other (See Comments)    Lips swelling, tingling Has patient had a PCN reaction causing immediate rash, facial/tongue/throat swelling, SOB or lightheadedness with hypotension: Yes Has patient had a PCN reaction causing severe rash involving mucus membranes or skin necrosis: No Has patient had a PCN reaction that required hospitalization: No Has patient had a PCN reaction occurring within the last 10 years: No If all of the above answers are "NO", then may proceed with Cephalosporin use.   . Benzocaine-Menthol     Throat swelling  . Biafine [Wound Dressings] Swelling  . Chloraseptic Sore Throat [Acetaminophen] Other (See Comments)    throat swelling  . Chlorpheniramine     Throat swelling  . Neosporin [Neomycin-Bacitracin Zn-Polymyx] Other (See Comments)    Skin redness, puffy  . Shellfish Allergy Swelling    "lips swell"  . Statins Other (See Comments)    Muscle weakness, weak  . Tape Other (See  Comments)    Skin redness   VITALS:  Blood pressure (!) 102/55, pulse (!) 104, temperature 98.2 F (36.8 C), temperature source Oral, resp. rate 20, height 5\' 5"  (1.651 m), weight 130.3 kg, SpO2 95 %. PHYSICAL EXAMINATION:  Physical Exam  Constitutional: Laying in bed, in NAD HEENT: Normocephalic and atraumatic, oropharynx is clear and moist. Pupils are equal, round, and reactive to light. Conjunctivae and EOM are normal. No scleral icterus.  Neck: Normal range of motion. Neck supple. No JVD present. No thyromegaly present.  Cardiovascular: Irregularly irregular rhythm, regular rate, no murmurs, rubs or gallops Respiratory: +bibasilar crackles present, +diffuse expiratory wheezing throughout all lung fields, normal work of breathing, able to speak in full sentences, Kappa in place GI: Soft. +BS, non-tender, non-distended Musculoskeletal: warm and well-perfused, no edema or cyanosis Neurological: CN 2-12 intact, normal muscle strength, normal sensation, no focal deficits  Skin: Skin is warm and dry. No rash noted. No erythema.  Psychiatric: She has a normal mood and affect. Her behavior is normal. Judgment and thought content normal.  LABORATORY PANEL:  Female CBC Recent Labs  Lab 04/07/18 0405  WBC 13.0*  HGB 13.6  HCT 42.4  PLT 220   ------------------------------------------------------------------------------------------------------------------ Chemistries  Recent Labs  Lab 04/06/18 1913 04/06/18 2010 04/07/18 0405  NA 139  --  138  K 4.1  --  3.9  CL 104  --  103  CO2 26  --  26  GLUCOSE 121*  --  132*  BUN 15  --  13  CREATININE 0.76  --  0.71  CALCIUM 9.8  --  8.6*  MG  --  2.0  --   AST 29  --   --   ALT 32  --   --   ALKPHOS 66  --   --   BILITOT 0.7  --   --    RADIOLOGY:  Dg Chest Port 1 View  Result Date: 04/06/2018 CLINICAL DATA:  Chest pain with shortness of breath and tachycardia EXAM: PORTABLE CHEST 1 VIEW COMPARISON:  09/23/2016 FINDINGS: Low volume  chest with interstitial crowding and streaky opacity at the bases. No Kerley lines, effusion, or pneumothorax. Likely normal heart size, although limited assessment. IMPRESSION: Indistinct opacities at the bases with appearance and low volumes favoring atelectasis. Electronically Signed   By: Monte Fantasia M.D.   On: 04/06/2018 19:56   ASSESSMENT AND PLAN:   Sepsis possible secondary to CAP- sepsis resolving, HR and leukocytosis have improved. UA without signs of infection. CXR with opacities at the lung bases, may just be atelectasis. PCT <0.10. - continue meropenem, consider stopping antibiotics - blood and urine cultures pending  Acute hypoxic respiratory failure 2/2 presumed CAP with possible asthma exacerbation- remains on 2L O2 - wean O2 as tolerated - add prednisone due to diffuse wheezing - add duonebs  - BNP and ECHO pending, would consider lasix if possible component of undiagnosed CHF  Paroxysmal atrial fibrillation- RVR has resolved s/p IV cardizem - continue home cardizem  - cardiology consulted  - ECHO pending  Hypertension- BPs have been soft - continue cardizem for rate control - restart metoprolol as able  Asthma exacerbation- likely has a component of bronchospasm - add prednisone  All the records are reviewed and case discussed with Care Management/Social Worker. Management plans discussed with the patient, family and they are in agreement.  CODE STATUS: Full Code  TOTAL TIME TAKING CARE OF THIS PATIENT: 35 minutes.   More than 50% of the time was spent in counseling/coordination of care: YES  POSSIBLE D/C IN 1-2 DAYS, DEPENDING ON CLINICAL CONDITION.   Berna Spare Aanshi Batchelder M.D on 04/07/2018 at 4:26 PM  Between 7am to 6pm - Pager - [phone]  After 6pm go to [email or web address] - Proofreader  [facility]  Office  [phone]  CC: Primary care physician; Baxter Hire, MD  Note: This dictation was prepared with Dragon  dictation along with smaller phrase technology. Any transcriptional errors that result from this process are unintentional.

## 2018-04-07 NOTE — Plan of Care (Signed)

## 2018-04-07 NOTE — Progress Notes (Signed)
Patient was transferred from the ED following c/o chest pain . On admission to the unit patient was A&O X4, denied being in pain, ambulatory with one assist. Patient was placed on the monitor, she was in Afib in the 80s-100s with stable VS. Admission documentation and care plan completed with patient . Patient has no acute event overnight.

## 2018-04-07 NOTE — Progress Notes (Signed)
Patient's heart rate in the 130s, 140s, sustaining more in the 120s-130s. Patient laying in the bed eating with no new complaints. Blood pressure 111/56. Dr. Nehemiah Massed notified. No new orders at this time. Per Dr. Nehemiah Massed he will look in her chart and assess her first before any new orders. Notified him about holding diltiazem po this morning per Dr.Mayo because of low blood pressure. Will continue to monitor patient.

## 2018-04-07 NOTE — Progress Notes (Signed)
Pharmacy Antibiotic Note  Laura Gonzalez is a 77 y.o. female admitted on 04/06/2018 with pneumonia.  Pharmacy has been consulted for meropenem dosing.  Plan: Meropenem 1 gram q 8 hours ordered.  Height: 5' 5.5" (166.4 cm) Weight: 285 lb (129.3 kg) IBW/kg (Calculated) : 58.15  Temp (24hrs), Avg:99.2 F (37.3 C), Min:99.2 F (37.3 C), Max:99.2 F (37.3 C)  Recent Labs  Lab 04/06/18 1913 04/06/18 1922  WBC 15.1*  --   CREATININE 0.76  --   LATICACIDVEN  --  1.8    Estimated Creatinine Clearance: 81.8 mL/min (by C-G formula based on SCr of 0.76 mg/dL).    Allergies  Allergen Reactions  . Amoxicillin Other (See Comments)    Lips swelling, tingling Has patient had a PCN reaction causing immediate rash, facial/tongue/throat swelling, SOB or lightheadedness with hypotension: Yes Has patient had a PCN reaction causing severe rash involving mucus membranes or skin necrosis: No Has patient had a PCN reaction that required hospitalization: No Has patient had a PCN reaction occurring within the last 10 years: No If all of the above answers are "NO", then may proceed with Cephalosporin use.   . Benzocaine-Menthol     Throat swelling  . Biafine [Wound Dressings] Swelling  . Chloraseptic Sore Throat [Acetaminophen] Other (See Comments)    throat swelling  . Chlorpheniramine     Throat swelling  . Neosporin [Neomycin-Bacitracin Zn-Polymyx] Other (See Comments)    Skin redness, puffy  . Shellfish Allergy Swelling    "lips swell"  . Statins Other (See Comments)    Muscle weakness, weak  . Tape Other (See Comments)    Skin redness    Antimicrobials this admission: Meropenem 10/15  >>    >>   Dose adjustments this admission:   Microbiology results: 10/15 BCx: pending   Thank you for allowing pharmacy to be a part of this patient's care.  Amani Nodarse S 04/07/2018 12:02 AM

## 2018-04-07 NOTE — Progress Notes (Signed)
Patient's blood pressure on the lower end this morning and last night. Patient has scheduled po cardizem, spoke to MD about it and per MD hold this medication for now because of the low blood pressure. Heart rate in the 90s. Will continue to monitor patient.

## 2018-04-07 NOTE — Progress Notes (Signed)
*  PRELIMINARY RESULTS* Echocardiogram 2D Echocardiogram has been performed.  [name] 04/07/2018, 10:36 AM

## 2018-04-07 NOTE — Progress Notes (Signed)
Pt HR staying in a range of 110-130 with highs in the 140's. BP 116/62. MD willis made aware. 90 mg Cardizem ordered one time dose. Prior to giving medication BP taken and at 106/55, HR now in the 90s. Cardizem helg for now, CCMD made aware to alert this RN if HR goes back to sustaining over 110. Will continue to monitor.

## 2018-04-07 NOTE — Progress Notes (Signed)
ANTICOAGULATION CONSULT NOTE - Follow Up Consult  Pharmacy Consult for warfarin dosing Indication: atrial fibrillation  Allergies  Allergen Reactions  . Amoxicillin Other (See Comments)    Lips swelling, tingling Has patient had a PCN reaction causing immediate rash, facial/tongue/throat swelling, SOB or lightheadedness with hypotension: Yes Has patient had a PCN reaction causing severe rash involving mucus membranes or skin necrosis: No Has patient had a PCN reaction that required hospitalization: No Has patient had a PCN reaction occurring within the last 10 years: No If all of the above answers are "NO", then may proceed with Cephalosporin use.   . Benzocaine-Menthol     Throat swelling  . Biafine [Wound Dressings] Swelling  . Chloraseptic Sore Throat [Acetaminophen] Other (See Comments)    throat swelling  . Chlorpheniramine     Throat swelling  . Neosporin [Neomycin-Bacitracin Zn-Polymyx] Other (See Comments)    Skin redness, puffy  . Shellfish Allergy Swelling    "lips swell"  . Statins Other (See Comments)    Muscle weakness, weak  . Tape Other (See Comments)    Skin redness    Patient Measurements: Height: 5' 5.5" (166.4 cm) Weight: 285 lb (129.3 kg) IBW/kg (Calculated) : 58.15 Heparin Dosing Weight: n/a  Vital Signs: Temp: 99.2 F (37.3 C) (10/16 1901) Temp Source: Oral (10/16 1901) BP: 142/104 (10/16 2330) Pulse Rate: 117 (10/16 2330)  Labs: Recent Labs    04/06/18 1913  HGB 15.4*  HCT 47.3*  PLT 243  LABPROT 25.3*  INR 2.34  CREATININE 0.76  TROPONINI <0.03    Estimated Creatinine Clearance: 81.8 mL/min (by C-G formula based on SCr of 0.76 mg/dL).   Medications:  Home dose 4 mg daily  Assessment: INR therapeutic on admission  Goal of Therapy:  INR 2-3    Plan:  Resume home dose as above. Daily INR while on abx.  Willy Vorce S 04/07/2018,12:05 AM
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
## 2018-04-08 LAB — BASIC METABOLIC PANEL
Anion gap: 9 (ref 5–15)
BUN: 14 mg/dL (ref 8–23)
CALCIUM: 8.8 mg/dL — AB (ref 8.9–10.3)
CHLORIDE: 105 mmol/L (ref 98–111)
CO2: 25 mmol/L (ref 22–32)
Creatinine, Ser: 0.68 mg/dL (ref 0.44–1.00)
GFR calc Af Amer: >60 mL/min (ref >60)
GFR calc non Af Amer: >60 mL/min (ref >60)
Glucose, Bld: 117 mg/dL — ABNORMAL HIGH (ref 70–99)
Potassium: 4 mmol/L (ref 3.5–5.1)
Sodium: 139 mmol/L (ref 135–145)

## 2018-04-08 LAB — BRAIN NATRIURETIC PEPTIDE: B Natriuretic Peptide: 303 pg/mL — ABNORMAL HIGH (ref 0.0–100.0)

## 2018-04-08 LAB — PROTIME-INR
INR: 1.91
Prothrombin Time: 21.6 seconds — ABNORMAL HIGH (ref 11.4–15.2)

## 2018-04-08 LAB — CBC
HEMATOCRIT: 40.6 % (ref 36.0–46.0)
HEMOGLOBIN: 13.2 g/dL (ref 12.0–15.0)
MCH: 31 pg (ref 26.0–34.0)
MCHC: 32.5 g/dL (ref 30.0–36.0)
MCV: 95.3 fL (ref 80.0–100.0)
Platelets: 210 10*3/uL (ref 150–400)
RBC: 4.26 MIL/uL (ref 3.87–5.11)
RDW: 13.9 % (ref 11.5–15.5)
WBC: 11.8 10*3/uL — ABNORMAL HIGH (ref 4.0–10.5)
nRBC: 0 % (ref 0.0–0.2)

## 2018-04-08 MED ORDER — TRAMADOL HCL 50 MG PO TABS
50.0000 mg | ORAL_TABLET | Freq: Four times a day (QID) | ORAL | Status: DC | PRN
  Administered 2018-04-08 (×2): 50 mg via ORAL
  Filled 2018-04-08 (×2): qty 1

## 2018-04-08 MED ORDER — WARFARIN SODIUM 6 MG PO TABS
6.0000 mg | ORAL_TABLET | Freq: Once | ORAL | Status: AC
  Administered 2018-04-08: 6 mg via ORAL
  Filled 2018-04-08: qty 1

## 2018-04-08 MED ORDER — AZITHROMYCIN 250 MG PO TABS
500.0000 mg | ORAL_TABLET | Freq: Every day | ORAL | Status: DC
  Administered 2018-04-08: 500 mg via ORAL
  Filled 2018-04-08: qty 2

## 2018-04-08 MED ORDER — DILTIAZEM HCL 25 MG/5ML IV SOLN
10.0000 mg | Freq: Once | INTRAVENOUS | Status: AC
  Administered 2018-04-08: 10 mg via INTRAVENOUS
  Filled 2018-04-08: qty 5

## 2018-04-08 NOTE — Progress Notes (Signed)
[facility] Encounter Note  Patient: Laura Gonzalez / Admit Date: 04/06/2018 / Date of Encounter: 04/08/2018, 4:32 PM   Subjective: Patient breathing much better since admission.  Some cough and congestion still.  Heart rate much better controlled with combination medication management.  No evidence of significant congestive heart failure or acute coronary syndrome symptoms  Review of Systems: Positive for: Redness of breath cough congestion Negative for: Vision change, hearing change, syncope, dizziness, nausea, vomiting,diarrhea, bloody stool, stomach pain, positive for cough, congestion, negative for headache for diaphoresis, urinary frequency, urinary pain,skin lesions, skin rashes Others previously listed  Objective: Telemetry: Atrial fibrillation with rapid ventricular rate Physical Exam: Blood pressure (!) 108/50, pulse 99, temperature 98.5 F (36.9 C), temperature source Oral, resp. rate 18, height 5\' 5"  (1.651 m), weight 130.3 kg, SpO2 93 %. Body mass index is 47.79 kg/m. General: Well developed, well nourished, in no acute distress. Head: Normocephalic, atraumatic, sclera non-icteric, no xanthomas, nares are without discharge. Neck: No apparent masses Lungs: Normal respirations with some wheezes, few's rhonchi, no rales , few crackles   Heart: irregular rate and rhythm, normal S1 S2, no murmur, no rub, no gallop, PMI is normal size and placement, carotid upstroke normal without bruit, jugular venous pressure normal Abdomen: Soft, non-tender, non-distended with normoactive bowel sounds. No hepatosplenomegaly. Abdominal aorta is normal size without bruit Extremities: 1+ edema, no clubbing, no cyanosis, no ulcers,  Peripheral: 2+ radial, 2+ femoral, 2+ dorsal pedal pulses Neuro: Alert and oriented. Moves all extremities spontaneously. Psych:  Responds to questions appropriately with a normal affect.   Intake/Output Summary (Last 24 hours) at 04/08/2018  1632 Last data filed at 04/08/2018 1358 Gross per 24 hour  Intake 720 ml  Output 400 ml  Net 320 ml    Inpatient Medications:  . azithromycin  500 mg Oral Daily  . calcium-vitamin D  1 tablet Oral BID  . diltiazem  300 mg Oral Daily  . ezetimibe  10 mg Oral Daily  . ipratropium-albuterol  3 mL Nebulization Q6H  . magnesium oxide  800 mg Oral Q1500  . metoprolol tartrate  25 mg Oral BID  . montelukast  10 mg Oral QHS  . predniSONE  50 mg Oral Q breakfast  . warfarin  6 mg Oral ONCE-1800  . Warfarin - Pharmacist Dosing Inpatient   Does not apply q1800  . zinc sulfate  220 mg Oral Daily   Infusions:   Labs: Recent Labs    04/06/18 2010 04/07/18 0405 04/08/18 0537  NA  --  138 139  K  --  3.9 4.0  CL  --  103 105  CO2  --  26 25  GLUCOSE  --  132* 117*  BUN  --  13 14  CREATININE  --  0.71 0.68  CALCIUM  --  8.6* 8.8*  MG 2.0  --   --    Recent Labs    04/06/18 1913  AST 29  ALT 32  ALKPHOS 66  BILITOT 0.7  PROT 7.1  ALBUMIN 3.8   Recent Labs    04/06/18 1913 04/07/18 0405 04/08/18 0537  WBC 15.1* 13.0* 11.8*  NEUTROABS 12.9*  --   --   HGB 15.4* 13.6 13.2  HCT 47.3* 42.4 40.6  MCV 94.4 96.1 95.3  PLT 243 220 210   Recent Labs    04/06/18 1913  TROPONINI <0.03   Invalid input(s): POCBNP No results for input(s): HGBA1C in the last 72 hours.  Weights: Filed Weights   04/06/18 1902 04/07/18 0124  Weight: 129.3 kg 130.3 kg     Radiology/Studies:  Dg Chest Port 1 View  Result Date: 04/06/2018 CLINICAL DATA:  Chest pain with shortness of breath and tachycardia EXAM: PORTABLE CHEST 1 VIEW COMPARISON:  09/23/2016 FINDINGS: Low volume chest with interstitial crowding and streaky opacity at the bases. No Kerley lines, effusion, or pneumothorax. Likely normal heart size, although limited assessment. IMPRESSION: Indistinct opacities at the bases with appearance and low volumes favoring atelectasis. Electronically Signed   By: Monte Fantasia M.D.    On: 04/06/2018 19:56     Assessment and Recommendation  77 y.o. female with known paroxysmal nonvalvular atrial fibrillation essential hypertension mixed hyperlipidemia with acute respiratory distress and/or pneumonia causing atrial fibrillation with rapid ventricular rate improving with multiple changes in medication management 1.  Continue combination of diltiazem and metoprolol for heart rate control at 100 bpm 2.  Currently not expecting conversion to normal sinus rhythm until patient has full resolution of respiratory distress 3.  Continue anticoagulation for further risk reduction in stroke with atrial fibrillation 4.  Begin ambulation and follow-up for improvements of symptoms and possible adjustments of medication management including beta-blocker 5.  No further cardiac diagnostics necessary at this time  Signed, Serafina Royals M.D. FACC

## 2018-04-08 NOTE — Progress Notes (Signed)
Pt HR sustaining in the 120-140 range. MD Marcille Blanco made aware. Earlier held Cardizem of 90 mg given per MD. Pt HR decreased to 90-105.

## 2018-04-08 NOTE — Care Management Important Message (Signed)
Copy of signed IM left with patient in room.  

## 2018-04-08 NOTE — Progress Notes (Signed)
PT Cancellation Note  Patient Details Name: Laura Gonzalez MRN: [id number] DOB: March 02, 1941   Cancelled Treatment:    Reason Eval/Treat Not Completed: Medical issues which prohibited therapy  Spoke with nurse this AM who states that pt has continued to have elevated HR and still needs her beta-blocker, asks [place] to hold PT until at least this afternoon.  Will check back later as appropriate.   Kreg Shropshire, DPT 04/08/2018, 9:08 AM

## 2018-04-08 NOTE — Care Management Note (Signed)
Case Management Note  Patient Details  Name: Laura Gonzalez MRN: [id number] Date of Birth: 11-12-1940  Subjective/Objective:      [facility] from [facility].  She lives alone.  Chronically on warfarin for A-fib.  Denies difficulty obtaining medications or with transportation.  She is independent at home and lives alone.  PT evaluation was canceled today due to increased heart rate with exertion.  Spoke with patient about possibilities for discharge.  She would be open to SNF or home health if recommended.           Action/Plan: Will continue to follow and progress patient.     Expected Discharge Date:                  Expected Discharge Plan:     In-House Referral:     Discharge planning Services  CM Consult  Post Acute Care Choice:    Choice offered to:     DME Arranged:    DME Agency:     HH Arranged:    HH Agency:     Status of Service:  In process, will continue to follow  If discussed at Long Length of Stay Meetings, dates discussed:    Additional Comments:  Elza Rafter, RN 04/08/2018, 3:12 PM

## 2018-04-08 NOTE — Progress Notes (Addendum)
[facility] [place] NAME: Laura Gonzalez    MR#:  [id number]  DATE OF BIRTH:  10-10-1940  SUBJECTIVE:   Continues to have shortness of breath and dry cough.  Afebrile. Tachycardic into 130s  REVIEW OF SYSTEMS:  Review of Systems  Constitutional: Negative for chills and fever.  HENT: Negative for congestion and sore throat.   Eyes: Negative for blurred vision and double vision.  Respiratory: Positive for shortness of breath and wheezing. Negative for cough.   Cardiovascular: Negative for chest pain, palpitations and leg swelling.  Gastrointestinal: Negative for abdominal pain, nausea and vomiting.  Genitourinary: Negative for dysuria and frequency.  Musculoskeletal: Negative for back pain and myalgias.  Neurological: Negative for dizziness and headaches.  Psychiatric/Behavioral: Negative for depression. The patient is not nervous/anxious.    DRUG ALLERGIES:   Allergies  Allergen Reactions  . Amoxicillin Other (See Comments)    Lips swelling, tingling Has patient had a PCN reaction causing immediate rash, facial/tongue/throat swelling, SOB or lightheadedness with hypotension: Yes Has patient had a PCN reaction causing severe rash involving mucus membranes or skin necrosis: No Has patient had a PCN reaction that required hospitalization: No Has patient had a PCN reaction occurring within the last 10 years: No If all of the above answers are "NO", then may proceed with Cephalosporin use.   . Benzocaine-Menthol     Throat swelling  . Biafine [Wound Dressings] Swelling  . Chloraseptic Sore Throat [Acetaminophen] Other (See Comments)    throat swelling  . Chlorpheniramine     Throat swelling  . Neosporin [Neomycin-Bacitracin Zn-Polymyx] Other (See Comments)    Skin redness, puffy  . Shellfish Allergy Swelling    "lips swell"  . Statins Other (See Comments)    Muscle weakness, weak  . Tape Other (See Comments)    Skin redness    VITALS:  Blood pressure (!) 108/50, pulse 99, temperature 98.5 F (36.9 C), temperature source Oral, resp. rate 18, height 5\' 5"  (1.651 m), weight 130.3 kg, SpO2 91 %. PHYSICAL EXAMINATION:  Physical Exam  Constitutional: Laying in bed, in NAD HEENT: Normocephalic and atraumatic, oropharynx is clear and moist. Pupils are equal, round, and reactive to light. Conjunctivae and EOM are normal. No scleral icterus.  Neck: Normal range of motion. Neck supple. No JVD present. No thyromegaly present.  Cardiovascular: Irregularly irregular rhythm, regular rate, no murmurs, rubs or gallops Respiratory: +bibasilar crackles present, +diffuse expiratory wheezing throughout all lung fields, normal work of breathing. GI: Soft. +BS, non-tender, non-distended Musculoskeletal: warm and well-perfused, no edema or cyanosis Neurological: CN 2-12 intact, normal muscle strength, normal sensation, no focal deficits  Skin: Skin is warm and dry. No rash noted. No erythema.  Psychiatric: She has a normal mood and affect. Her behavior is normal. Judgment and thought content normal.  LABORATORY PANEL:  Female CBC Recent Labs  Lab 04/08/18 0537  WBC 11.8*  HGB 13.2  HCT 40.6  PLT 210   ------------------------------------------------------------------------------------------------------------------ Chemistries  Recent Labs  Lab 04/06/18 1913 04/06/18 2010  04/08/18 0537  NA 139  --    < > 139  K 4.1  --    < > 4.0  CL 104  --    < > 105  CO2 26  --    < > 25  GLUCOSE 121*  --    < > 117*  BUN 15  --    < > 14  CREATININE 0.76  --    < >  0.68  CALCIUM 9.8  --    < > 8.8*  MG  --  2.0  --   --   AST 29  --   --   --   ALT 32  --   --   --   ALKPHOS 66  --   --   --   BILITOT 0.7  --   --   --    < > = values in this interval not displayed.   RADIOLOGY:  No results found. ASSESSMENT AND PLAN:   *Atrial fibrillation with rapid ventricular rate in the 140s.  Stat dose of Cardizem and  metoprolol. Cardiology following.  Continue telemetry monitoring. On anticoagulation. Will need IV drip if remains uncontrolled.  * Sepsis secondary to acute bronchitis likely viral Not pneumonia Stop meropenem.  Will add azithromycin. Prednisone.  Nebulizers.  *Acute hypoxic respiratory failure secondary to bronchitis has resolved  * Hypertension On Cardizem and metoprolol  All the records are reviewed and case discussed with Care Management/Social Worker. Management plans discussed with the patient, family and they are in agreement.  CODE STATUS: Full Code  TOTAL CRITICAL CARE TIME TAKING CARE OF THIS PATIENT: 35 minutes.   POSSIBLE D/C IN 1-2 DAYS, DEPENDING ON CLINICAL CONDITION.   Laura Gonzalez M.D on 04/08/2018 at 11:46 AM  Between 7am to 6pm - Pager - [phone]  After 6pm go to [email or web address] - Proofreader  [facility]  Office  [phone]  CC: Primary care physician; Baxter Hire, MD  Note: This dictation was prepared with Dragon dictation along with smaller phrase technology. Any transcriptional errors that result from this process are unintentional.

## 2018-04-08 NOTE — Progress Notes (Signed)
ANTICOAGULATION CONSULT NOTE - Follow Up Consult  Pharmacy Consult for warfarin dosing Indication: atrial fibrillation  Allergies  Allergen Reactions  . Amoxicillin Other (See Comments)    Lips swelling, tingling Has patient had a PCN reaction causing immediate rash, facial/tongue/throat swelling, SOB or lightheadedness with hypotension: Yes Has patient had a PCN reaction causing severe rash involving mucus membranes or skin necrosis: No Has patient had a PCN reaction that required hospitalization: No Has patient had a PCN reaction occurring within the last 10 years: No If all of the above answers are "NO", then may proceed with Cephalosporin use.   . Benzocaine-Menthol     Throat swelling  . Biafine [Wound Dressings] Swelling  . Chloraseptic Sore Throat [Acetaminophen] Other (See Comments)    throat swelling  . Chlorpheniramine     Throat swelling  . Neosporin [Neomycin-Bacitracin Zn-Polymyx] Other (See Comments)    Skin redness, puffy  . Shellfish Allergy Swelling    "lips swell"  . Statins Other (See Comments)    Muscle weakness, weak  . Tape Other (See Comments)    Skin redness    Patient Measurements: Height: 5\' 5"  (165.1 cm) Weight: 287 lb 3.2 oz (130.3 kg) IBW/kg (Calculated) : 57 Heparin Dosing Weight: n/a  Vital Signs: Temp: 98.5 F (36.9 C) (10/18 0731) Temp Source: Oral (10/18 0731) BP: 108/50 (10/18 0731) Pulse Rate: 109 (10/18 0731)  Labs: Recent Labs    04/06/18 1913 04/07/18 0405 04/08/18 0537  HGB 15.4* 13.6 13.2  HCT 47.3* 42.4 40.6  PLT 243 220 210  LABPROT 25.3* 23.3* 21.6*  INR 2.34 2.10 1.91  CREATININE 0.76 0.71 0.68  TROPONINI <0.03  --   --     Estimated Creatinine Clearance: 81.5 mL/min (by C-G formula based on SCr of 0.68 mg/dL).   Medications:  Home dose 4 mg daily  Assessment: INR therapeutic on admission. Appears pt had a slightly supratherapeutic level (3.3) on 10/14 and was asked to hold one dose (10/15), but it appears  pt did not get a dose here on 10/16 either.  Slightly low level (1.9) this AM, may be a result of this hold. It appears pt has been stable on 4mg  for awhile.   10/16 INR 2.34 10/17 INR 2.10 10/18 INR 1.91  Goal of Therapy:  INR 2-3    Plan:  I will give 6mg  tonight due to low INR and unnecessary 2 day hold. Daily INR while on abx.  Ramond Dial, Pharm.D, BCPS Clinical Pharmacist 04/08/2018,7:41 AM

## 2018-04-08 NOTE — Evaluation (Signed)
Physical Therapy Evaluation Patient Details Name: Laura Gonzalez MRN: [id number] DOB: May 22, 1941 Today's Date: 04/08/2018   History of Present Illness  Pt is a 77 y.o female presenting with SOB, tachycardia, and chest pain. Pt with afib with RVR and sepsis as well upon admissiong. PMH significant for HTN, afib, R TKR, skin cancer, and SOB. Pt has previously had HTN and afib under control with medication.   Clinical Impression  Pt pleasant and eager to work with PT upon arrival. Pt with resting HR in 90's and SpO2 in low 90's however asymptomatic upon arrival. Pt reporting her blood pressure has been causing lightheadedness and fatigue thus measured beginning of session 109/53. Pt demonstrating mod independence with basic bed mobility with no hesitation but increased time and effort noted. Pt demonstrates only minimal difficulty with sit to stand needing HOB elevated but otherwise needs no physical assist. During ambulation pt with normal gait mechanics compared to baseline but HR highly labile between 110's to as high as 144. Returned to room due to elevated HR however pt continuing to be asymptomatic. BP 135/106 once returned to bed. Pt would benefit from continued skilled PT via HHPT due to limited activity tolerance and generalized weakness.     Follow Up Recommendations Home health PT    Equipment Recommendations  None recommended by PT    Recommendations for Other Services       Precautions / Restrictions Precautions Precautions: Fall Restrictions Weight Bearing Restrictions: No      Mobility  Bed Mobility Overal bed mobility: Modified Independent             General bed mobility comments: No physical assist required for supine to sit or sit to supine, increased time and effort noted, pt asymptomatic upon sitting.   Transfers Overall transfer level: Needs assistance Equipment used: Rolling walker (2 wheeled) Transfers: Sit to/from Stand Sit to Stand: Min guard;From  elevated surface         General transfer comment: Min guard only for safety no physical assist requried, pt asymptomatic upon standing with no concerns for balance with RW.   Ambulation/Gait Ambulation/Gait assistance: Min guard Gait Distance (Feet): 60 Feet Assistive device: Rolling walker (2 wheeled)       General Gait Details: Pt with good effort steady throughout ambulation, fatigued and breathing with increased effort noted, normal gait mechancis.   Stairs            Wheelchair Mobility    Modified Rankin (Stroke Patients Only)       Balance Overall balance assessment: Mild deficits observed, not formally tested                                           Pertinent Vitals/Pain Pain Assessment: 0-10 Pain Score: 4  Pain Location: Chest Pain Intervention(s): Monitored during session    Home Living Family/patient expects to be discharged to:: Private residence Living Arrangements: Alone   Type of Home: Apartment Home Access: Elevator     Home Layout: One level Home Equipment: Clinical cytogeneticist - 4 wheels      Prior Function Level of Independence: Independent with assistive device(s)         Comments: Pt still drives and is independent with all ADL's and limited community ambulation.      Hand Dominance        Extremity/Trunk Assessment   Upper Extremity Assessment  Upper Extremity Assessment: Overall WFL for tasks assessed    Lower Extremity Assessment Lower Extremity Assessment: Generalized weakness(pain R LE not affecting strength, grossly >3+/5 age appropriate deficits noted)       Communication   Communication: No difficulties  Cognition Arousal/Alertness: Awake/alert Behavior During Therapy: WFL for tasks assessed/performed Overall Cognitive Status: Within Functional Limits for tasks assessed                                        General Comments General comments (skin integrity, edema,  etc.): HR 90's at rest, increasing to 120's with exertion during bed mobility and strength testing. HR highly labile during ambulation between 110-144.     Exercises     Assessment/Plan    PT Assessment Patient needs continued PT services  PT Problem List Decreased strength;Decreased activity tolerance;Decreased balance;Decreased mobility;Obesity       PT Treatment Interventions DME instruction;Stair training;Gait training;Functional mobility training;Therapeutic activities;Therapeutic exercise;Balance training;Patient/family education;Modalities    PT Goals (Current goals can be found in the Care Plan section)  Acute Rehab PT Goals Patient Stated Goal: to regain strength and get HR under control PT Goal Formulation: With patient Time For Goal Achievement: 04/22/18 Potential to Achieve Goals: Good    Frequency Min 2X/week   Barriers to discharge        Co-evaluation               AM-PAC PT "6 Clicks" Daily Activity  Outcome Measure Difficulty turning over in bed (including adjusting bedclothes, sheets and blankets)?: A Little Difficulty moving from lying on back to sitting on the side of the bed? : A Little Difficulty sitting down on and standing up from a chair with arms (e.g., wheelchair, bedside commode, etc,.)?: A Little Help needed moving to and from a bed to chair (including a wheelchair)?: A Little Help needed walking in hospital room?: A Little Help needed climbing 3-5 steps with a railing? : A Little 6 Click Score: 18    End of Session Equipment Utilized During Treatment: Gait belt Activity Tolerance: Patient tolerated treatment well;Patient limited by fatigue Patient left: in bed;with call bell/phone within reach;with bed alarm set   PT Visit Diagnosis: Muscle weakness (generalized) (M62.81);Pain;Difficulty in walking, not elsewhere classified (R26.2) Pain - Right/Left: Right Pain - part of body: Knee    Time: 0300-9233 PT Time Calculation (min)  (ACUTE ONLY): 28 min   Charges:              Ernie Avena, SPT 04/08/2018, 4:34 PM

## 2018-04-09 ENCOUNTER — Inpatient Hospital Stay: Payer: Medicare Other

## 2018-04-09 LAB — PROTIME-INR
INR: 1.94
PROTHROMBIN TIME: 21.9 s — AB (ref 11.4–15.2)

## 2018-04-09 MED ORDER — LEVOFLOXACIN IN D5W 750 MG/150ML IV SOLN
750.0000 mg | INTRAVENOUS | Status: DC
  Administered 2018-04-09 – 2018-04-11 (×3): 750 mg via INTRAVENOUS
  Filled 2018-04-09 (×3): qty 150

## 2018-04-09 MED ORDER — NITROGLYCERIN 0.4 MG SL SUBL: 0.4000 mg | SUBLINGUAL_TABLET | SUBLINGUAL | Status: DC | PRN

## 2018-04-09 MED ORDER — WARFARIN SODIUM 4 MG PO TABS
4.0000 mg | ORAL_TABLET | Freq: Every day | ORAL | Status: DC
  Administered 2018-04-09 – 2018-04-11 (×3): 4 mg via ORAL
  Filled 2018-04-09 (×4): qty 1

## 2018-04-09 MED ORDER — FUROSEMIDE 40 MG PO TABS
40.0000 mg | ORAL_TABLET | Freq: Every day | ORAL | Status: DC
  Administered 2018-04-09 – 2018-04-12 (×4): 40 mg via ORAL
  Filled 2018-04-09 (×4): qty 1

## 2018-04-09 MED ORDER — LORAZEPAM 2 MG/ML IJ SOLN
0.5000 mg | Freq: Three times a day (TID) | INTRAMUSCULAR | Status: DC | PRN
  Administered 2018-04-09: 0.5 mg via INTRAVENOUS
  Filled 2018-04-09: qty 1

## 2018-04-09 MED ORDER — LEVOFLOXACIN IN D5W 750 MG/150ML IV SOLN: 750.0000 mg | INTRAVENOUS | Status: DC

## 2018-04-09 MED ORDER — DILTIAZEM HCL 25 MG/5ML IV SOLN
5.0000 mg | Freq: Once | INTRAVENOUS | Status: AC
  Administered 2018-04-09: 5 mg via INTRAVENOUS
  Filled 2018-04-09: qty 5

## 2018-04-09 MED ORDER — LORAZEPAM 2 MG/ML IJ SOLN
0.5000 mg | Freq: Once | INTRAMUSCULAR | Status: AC
  Administered 2018-04-09: 0.5 mg via INTRAVENOUS
  Filled 2018-04-09: qty 1

## 2018-04-09 MED ORDER — LORAZEPAM 2 MG/ML IJ SOLN: 0.5000 mg | Freq: Once | INTRAMUSCULAR | Status: DC

## 2018-04-09 MED ORDER — LEVALBUTEROL HCL 1.25 MG/0.5ML IN NEBU
1.2500 mg | INHALATION_SOLUTION | Freq: Four times a day (QID) | RESPIRATORY_TRACT | Status: DC | PRN
  Administered 2018-04-09: 1.25 mg via RESPIRATORY_TRACT
  Filled 2018-04-09 (×2): qty 0.5

## 2018-04-09 MED ORDER — TIOTROPIUM BROMIDE MONOHYDRATE 18 MCG IN CAPS
18.0000 ug | ORAL_CAPSULE | Freq: Every day | RESPIRATORY_TRACT | Status: DC
  Administered 2018-04-09: 18 ug via RESPIRATORY_TRACT
  Filled 2018-04-09 (×2): qty 5

## 2018-04-09 NOTE — Progress Notes (Signed)
Patient refused CPAP.

## 2018-04-09 NOTE — Progress Notes (Signed)
Pt HR continues to go into the 130-140 range. Pt states she still doesn't feel good and is breathing with shallow breaths. She has some expiratory wheezing. Lung sounds clear but diminished. MD Marcille Blanco made aware. CPAP ordered at this time. RT made aware.

## 2018-04-09 NOTE — Plan of Care (Signed)

## 2018-04-09 NOTE — Progress Notes (Signed)
[facility] Encounter Note  Patient: Laura Gonzalez / Admit Date: 04/06/2018 / Date of Encounter: 04/09/2018, 6:19 AM   Subjective: Patient breathing is much more labored this morning than before but no evidence of significant changes in overall exam.  Some cough and congestion still.  X-ray suggesting left lower lobe consolidation worse than before with possible fusion.  Heart rate much increased at this time likely secondary to worsening symptoms of lung congestion and hypoxia.  No evidence of significant congestive heart failure or acute coronary syndrome symptoms Echocardiogram showing normal LV systolic function without evidence of significant valvular heart disease Review of Systems: Positive for: Shortness of breath cough congestion Negative for: Vision change, hearing change, syncope, dizziness, nausea, vomiting,diarrhea, bloody stool, stomach pain, positive for cough, congestion, negative for headache for diaphoresis, urinary frequency, urinary pain,skin lesions, skin rashes Others previously listed  Objective: Telemetry: Atrial fibrillation with rapid ventricular rate Physical Exam: Blood pressure (!) 143/81, pulse (!) 125, temperature 98.1 F (36.7 C), temperature source Oral, resp. rate 18, height 5\' 5"  (1.651 m), weight 130.3 kg, SpO2 95 %. Body mass index is 47.79 kg/m. General: Well developed, well nourished, in no acute distress. Head: Normocephalic, atraumatic, sclera non-icteric, no xanthomas, nares are without discharge. Neck: No apparent masses Lungs: Normal respirations with some wheezes, few's rhonchi, no rales , few crackles   Heart: irregular rate and rhythm, normal S1 S2, no murmur, no rub, no gallop, PMI is normal size and placement, carotid upstroke normal without bruit, jugular venous pressure normal Abdomen: Soft, non-tender, non-distended with normoactive bowel sounds. No hepatosplenomegaly. Abdominal aorta is normal size without  bruit Extremities: 1+ edema, no clubbing, no cyanosis, no ulcers,  Peripheral: 2+ radial, 2+ femoral, 2+ dorsal pedal pulses Neuro: Alert and oriented. Moves all extremities spontaneously. Psych:  Responds to questions appropriately with a normal affect.   Intake/Output Summary (Last 24 hours) at 04/09/2018 6834 Last data filed at 04/09/2018 0109 Gross per 24 hour  Intake 600 ml  Output 700 ml  Net -100 ml    Inpatient Medications:  . calcium-vitamin D  1 tablet Oral BID  . diltiazem  300 mg Oral Daily  . ezetimibe  10 mg Oral Daily  . ipratropium-albuterol  3 mL Nebulization Q6H  . magnesium oxide  800 mg Oral Q1500  . metoprolol tartrate  25 mg Oral BID  . montelukast  10 mg Oral QHS  . predniSONE  50 mg Oral Q breakfast  . tiotropium  18 mcg Inhalation Daily  . Warfarin - Pharmacist Dosing Inpatient   Does not apply q1800  . zinc sulfate  220 mg Oral Daily   Infusions:  . levofloxacin (LEVAQUIN) IV      Labs: Recent Labs    04/06/18 2010 04/07/18 0405 04/08/18 0537  NA  --  138 139  K  --  3.9 4.0  CL  --  103 105  CO2  --  26 25  GLUCOSE  --  132* 117*  BUN  --  13 14  CREATININE  --  0.71 0.68  CALCIUM  --  8.6* 8.8*  MG 2.0  --   --    Recent Labs    04/06/18 1913  AST 29  ALT 32  ALKPHOS 66  BILITOT 0.7  PROT 7.1  ALBUMIN 3.8   Recent Labs    04/06/18 1913 04/07/18 0405 04/08/18 0537  WBC 15.1* 13.0* 11.8*  NEUTROABS 12.9*  --   --   HGB  15.4* 13.6 13.2  HCT 47.3* 42.4 40.6  MCV 94.4 96.1 95.3  PLT 243 220 210   Recent Labs    04/06/18 1913  TROPONINI <0.03   Invalid input(s): POCBNP No results for input(s): HGBA1C in the last 72 hours.   Weights: Filed Weights   04/06/18 1902 04/07/18 0124  Weight: 129.3 kg 130.3 kg     Radiology/Studies:  Dg Chest Port 1 View  Result Date: 04/09/2018 CLINICAL DATA:  Increasing cough and shortness of breath. EXAM: PORTABLE CHEST 1 VIEW COMPARISON:  Radiograph 06/06/2018 FINDINGS: Lower  lung volumes from prior exam. Progressive left basilar opacity and blunting of left costophrenic angle. Streaky right lung base atelectasis is similar. Unchanged heart size and mediastinal contours. Bronchovascular crowding related to low lung volumes, no convincing pulmonary edema. No pneumothorax. Detailed evaluation limited by habitus. IMPRESSION: Progressive lower lung volumes and increasing left basilar opacity which may represent progressive atelectasis, pneumonia or aspiration in the appropriate clinical setting. Possible left pleural effusion. Electronically Signed   By: Keith Rake M.D.   On: 04/09/2018 01:42   Dg Chest Port 1 View  Result Date: 04/06/2018 CLINICAL DATA:  Chest pain with shortness of breath and tachycardia EXAM: PORTABLE CHEST 1 VIEW COMPARISON:  09/23/2016 FINDINGS: Low volume chest with interstitial crowding and streaky opacity at the bases. No Kerley lines, effusion, or pneumothorax. Likely normal heart size, although limited assessment. IMPRESSION: Indistinct opacities at the bases with appearance and low volumes favoring atelectasis. Electronically Signed   By: Monte Fantasia M.D.   On: 04/06/2018 19:56     Assessment and Recommendation  77 y.o. female with known paroxysmal nonvalvular atrial fibrillation essential hypertension mixed hyperlipidemia with acute respiratory distress and/or pneumonia causing atrial fibrillation with rapid ventricular rate improving with multiple changes in medication management with worsening of lung congestion at this time 1.  Continue combination of diltiazem and metoprolol for heart rate control at 100 to 120 Bpm until patient has better resolution of respiratory failure 2.  Currently not expecting conversion to normal sinus rhythm until patient has full resolution of respiratory distress therefore currently has reasonable heart rate control due to condition 3.  Continue anticoagulation for further risk reduction in stroke with atrial  fibrillation 4.  Further treatment of lung issues including improvements of atelectasis and respiratory difficulty with inhalers and better pulmonary toilet 5.  Addition of Lasix for further treatment of possible effusion and/or diastolic dysfunction heart failure  Signed, Serafina Royals M.D. FACC

## 2018-04-09 NOTE — Progress Notes (Signed)
Pt c/o SOB after 2115 breathing treatment. Pt HR increased to 120-140s at times. MD Jannifer Franklin made aware. 10 mg IV cardizem given. Pt HR decreased to 110-120 but she still c/o difficulty getting a good breath. MD Jannifer Franklin ordered another 5 mg IV cardizem and a chest xray. Pt also put on high flow nasal canula.

## 2018-04-09 NOTE — Progress Notes (Signed)
[place] at [place] NAME: Laura Gonzalez    MR#:  [id number]  DATE OF BIRTH:  December 10, 1940  SUBJECTIVE:   Continues to have shortness of breath and dry cough.   Worsening breathing today.  On 6 L oxygen. Positive orthopnea Continues to be tachycardic  REVIEW OF SYSTEMS:  Review of Systems  Constitutional: Negative for chills and fever.  HENT: Negative for congestion and sore throat.   Eyes: Negative for blurred vision and double vision.  Respiratory: Positive for shortness of breath and wheezing. Negative for cough.   Cardiovascular: Negative for chest pain, palpitations and leg swelling.  Gastrointestinal: Negative for abdominal pain, nausea and vomiting.  Genitourinary: Negative for dysuria and frequency.  Musculoskeletal: Negative for back pain and myalgias.  Neurological: Negative for dizziness and headaches.  Psychiatric/Behavioral: Negative for depression. The patient is not nervous/anxious.    DRUG ALLERGIES:   Allergies  Allergen Reactions  . Amoxicillin Other (See Comments)    Lips swelling, tingling Has patient had a PCN reaction causing immediate rash, facial/tongue/throat swelling, SOB or lightheadedness with hypotension: Yes Has patient had a PCN reaction causing severe rash involving mucus membranes or skin necrosis: No Has patient had a PCN reaction that required hospitalization: No Has patient had a PCN reaction occurring within the last 10 years: No If all of the above answers are "NO", then may proceed with Cephalosporin use.   . Benzocaine-Menthol     Throat swelling  . Biafine [Wound Dressings] Swelling  . Chloraseptic Sore Throat [Acetaminophen] Other (See Comments)    throat swelling  . Chlorpheniramine     Throat swelling  . Neosporin [Neomycin-Bacitracin Zn-Polymyx] Other (See Comments)    Skin redness, puffy  . Shellfish Allergy Swelling    "lips swell"  . Statins Other (See Comments)    Muscle  weakness, weak  . Tape Other (See Comments)    Skin redness   VITALS:  Blood pressure (!) 142/67, pulse (!) 102, temperature 98.1 F (36.7 C), temperature source Oral, resp. rate 18, height 5\' 5"  (1.651 m), weight 130.3 kg, SpO2 95 %. PHYSICAL EXAMINATION:  Physical Exam  Constitutional: Laying in bed, in respiratory distress HEENT: Normocephalic and atraumatic, oropharynx is clear and moist. Pupils are equal, round, and reactive to light. Conjunctivae and EOM are normal. No scleral icterus.  Neck: Normal range of motion. Neck supple. No JVD present. No thyromegaly present.  Cardiovascular: Irregularly irregular rhythm, regular rate, no murmurs, rubs or gallops Respiratory: Bilateral wheezing and crackles.  Increased work of breathing GI: Soft. +BS, non-tender, non-distended Musculoskeletal: warm and well-perfused, no edema or cyanosis Neurological: CN 2-12 intact, normal muscle strength, normal sensation, no focal deficits  Skin: Skin is warm and dry. No rash noted. No erythema.  Psychiatric: She has a normal mood and affect. Her behavior is normal. Judgment and thought content normal.  LABORATORY PANEL:  Female CBC Recent Labs  Lab 04/08/18 0537  WBC 11.8*  HGB 13.2  HCT 40.6  PLT 210   ------------------------------------------------------------------------------------------------------------------ Chemistries  Recent Labs  Lab 04/06/18 1913 04/06/18 2010  04/08/18 0537  NA 139  --    < > 139  K 4.1  --    < > 4.0  CL 104  --    < > 105  CO2 26  --    < > 25  GLUCOSE 121*  --    < > 117*  BUN 15  --    < >  14  CREATININE 0.76  --    < > 0.68  CALCIUM 9.8  --    < > 8.8*  MG  --  2.0  --   --   AST 29  --   --   --   ALT 32  --   --   --   ALKPHOS 66  --   --   --   BILITOT 0.7  --   --   --    < > = values in this interval not displayed.   RADIOLOGY:  Dg Chest Port 1 View  Result Date: 04/09/2018 CLINICAL DATA:  Increasing cough and shortness of breath. EXAM:  PORTABLE CHEST 1 VIEW COMPARISON:  Radiograph 06/06/2018 FINDINGS: Lower lung volumes from prior exam. Progressive left basilar opacity and blunting of left costophrenic angle. Streaky right lung base atelectasis is similar. Unchanged heart size and mediastinal contours. Bronchovascular crowding related to low lung volumes, no convincing pulmonary edema. No pneumothorax. Detailed evaluation limited by habitus. IMPRESSION: Progressive lower lung volumes and increasing left basilar opacity which may represent progressive atelectasis, pneumonia or aspiration in the appropriate clinical setting. Possible left pleural effusion. Electronically Signed   By: Keith Rake M.D.   On: 04/09/2018 01:42   ASSESSMENT AND PLAN:   *Acute hypoxic respiratory failure secondary to combination of CHF and bronchitis. Wean oxygen as tolerated.  *Acute on chronic diastolic congestive heart failure.  Start Lasix.  Stat dose.  Monitor input and output.  Replace potassium as needed.  *Atrial fibrillation with rapid ventricular rate in the 140s intermittently On Cardizem and metoprolol.  Heart rate slowly improving.  * Sepsis secondary to acute bronchitis likely viral Not pneumonia Stopped meropenem.  Added azithromycin. Prednisone.  Nebulizers.  * Hypertension On Cardizem and metoprolol  All the records are reviewed and case discussed with Care Management/Social Worker. Management plans discussed with the patient, family and they are in agreement.  CODE STATUS: Full Code  TOTAL CRITICAL CARE TIME TAKING CARE OF THIS PATIENT: 32 minutes.   POSSIBLE D/C IN 1-2 DAYS, DEPENDING ON CLINICAL CONDITION.  Leia Alf Katrina Brosh M.D on 04/09/2018 at 11:20 AM  Between 7am to 6pm - Pager - [phone]  After 6pm go to [email or web address] - Proofreader  [employer] [facility] [place]  Office  [phone]  CC: Primary care physician; Baxter Hire, MD  Note: This dictation was prepared with Dragon  dictation along with smaller phrase technology. Any transcriptional errors that result from this process are unintentional.

## 2018-04-09 NOTE — Progress Notes (Signed)
Pharmacy Antibiotic Note  Laura Gonzalez is a 77 y.o. female admitted on 04/06/2018 with pneumonia.  Pharmacy has been consulted for Levaquin dosing.  Plan: Levaquin 750 mg q 24 hours ordered  Height: 5\' 5"  (165.1 cm) Weight: 287 lb 3.2 oz (130.3 kg) IBW/kg (Calculated) : 57  Temp (24hrs), Avg:98.3 F (36.8 C), Min:97.7 F (36.5 C), Max:98.7 F (37.1 C)  Recent Labs  Lab 04/06/18 1913 04/06/18 1922 04/07/18 0405 04/08/18 0537  WBC 15.1*  --  13.0* 11.8*  CREATININE 0.76  --  0.71 0.68  LATICACIDVEN  --  1.8  --   --     Estimated Creatinine Clearance: 81.5 mL/min (by C-G formula based on SCr of 0.68 mg/dL).    Allergies  Allergen Reactions  . Amoxicillin Other (See Comments)    Lips swelling, tingling Has patient had a PCN reaction causing immediate rash, facial/tongue/throat swelling, SOB or lightheadedness with hypotension: Yes Has patient had a PCN reaction causing severe rash involving mucus membranes or skin necrosis: No Has patient had a PCN reaction that required hospitalization: No Has patient had a PCN reaction occurring within the last 10 years: No If all of the above answers are "NO", then may proceed with Cephalosporin use.   . Benzocaine-Menthol     Throat swelling  . Biafine [Wound Dressings] Swelling  . Chloraseptic Sore Throat [Acetaminophen] Other (See Comments)    throat swelling  . Chlorpheniramine     Throat swelling  . Neosporin [Neomycin-Bacitracin Zn-Polymyx] Other (See Comments)    Skin redness, puffy  . Shellfish Allergy Swelling    "lips swell"  . Statins Other (See Comments)    Muscle weakness, weak  . Tape Other (See Comments)    Skin redness    Antimicrobials this admission: 10/18 azithromycin >> 10/19 Levaquin   >>   Dose adjustments this admission:   Microbiology results: 10/16 BCx: NG 2d  Thank you for allowing pharmacy to be a part of this patient's care.  Yusif Gnau S 04/09/2018 2:26 AM

## 2018-04-09 NOTE — Progress Notes (Signed)
Placed pt on auto Cpap per MD order. Pt does not feel as if she is getting enough pressure. Changed pt to CPAP 10 with 6L O2 bleed-in. Pt states that it feels better. Spo2 95%, RR 24, HR120's

## 2018-04-09 NOTE — Progress Notes (Signed)
Pt still having shallow labored breathing with pursed lips. MD Marcille Blanco made aware and 0.5 mg ativan ordered.

## 2018-04-09 NOTE — Progress Notes (Addendum)
Advance care planning    Purpose of Encounter Acute bronchitis, atrial fibrillation and CODE STATUS discussion  Parties in Attendance Patient.  Patients Decisional capacity Patient is alert and oriented.  Able to make decisions.  Has documented healthcare power of attorney.  Tim Neese-friend  Patient has documented advanced directives.  She does want to be intubated and have CPR if needed.  Does not want any prolonged resuscitative efforts.  FULL CODE  Time spent - 17 minutes

## 2018-04-09 NOTE — Progress Notes (Signed)
ANTICOAGULATION CONSULT NOTE - Follow Up Consult  Pharmacy Consult for warfarin dosing Indication: atrial fibrillation  Allergies  Allergen Reactions  . Amoxicillin Other (See Comments)    Lips swelling, tingling Has patient had a PCN reaction causing immediate rash, facial/tongue/throat swelling, SOB or lightheadedness with hypotension: Yes Has patient had a PCN reaction causing severe rash involving mucus membranes or skin necrosis: No Has patient had a PCN reaction that required hospitalization: No Has patient had a PCN reaction occurring within the last 10 years: No If all of the above answers are "NO", then may proceed with Cephalosporin use.   . Benzocaine-Menthol     Throat swelling  . Biafine [Wound Dressings] Swelling  . Chloraseptic Sore Throat [Acetaminophen] Other (See Comments)    throat swelling  . Chlorpheniramine     Throat swelling  . Neosporin [Neomycin-Bacitracin Zn-Polymyx] Other (See Comments)    Skin redness, puffy  . Shellfish Allergy Swelling    "lips swell"  . Statins Other (See Comments)    Muscle weakness, weak  . Tape Other (See Comments)    Skin redness    Patient Measurements: Height: 5\' 5"  (165.1 cm) Weight: 287 lb 3.2 oz (130.3 kg) IBW/kg (Calculated) : 57 Heparin Dosing Weight: n/a  Vital Signs: Temp: 98.1 F (36.7 C) (10/19 0721) Temp Source: Oral (10/19 0721) BP: 142/67 (10/19 0721) Pulse Rate: 102 (10/19 0721)  Labs: Recent Labs    04/06/18 1913 04/07/18 0405 04/08/18 0537 04/09/18 0457  HGB 15.4* 13.6 13.2  --   HCT 47.3* 42.4 40.6  --   PLT 243 220 210  --   LABPROT 25.3* 23.3* 21.6* 21.9*  INR 2.34 2.10 1.91 1.94  CREATININE 0.76 0.71 0.68  --   TROPONINI <0.03  --   --   --     Estimated Creatinine Clearance: 81.5 mL/min (by C-G formula based on SCr of 0.68 mg/dL).   Medications:  Home dose 4 mg daily  Assessment: INR therapeutic on admission. Appears pt had a slightly supratherapeutic level (3.3) on 10/14 and  was asked to hold one dose (10/15), but it appears pt did not get a dose here on 10/16 either.   It appears pt has been stable on 4mg  for awhile.   10/16 INR 2.34 10/17 INR 2.10   Warfarin 4mg  10/18 INR 1.91   Warfarin 6mg  10/19 INR 1.94  Goal of Therapy:  INR 2-3   Plan:  Resume home dose of 4mg  daily.  Check INR daily while on ABX.  Pharmacy will continue to monitor and adjust as per protocol.   Olivia Canter, [facility] Clinical Pharmacist 04/09/2018,10:09 AM

## 2018-04-10 LAB — PROTIME-INR
INR: 2.59
Prothrombin Time: 27.4 seconds — ABNORMAL HIGH (ref 11.4–15.2)

## 2018-04-10 MED ORDER — TIOTROPIUM BROMIDE MONOHYDRATE 18 MCG IN CAPS
18.0000 ug | ORAL_CAPSULE | Freq: Every day | RESPIRATORY_TRACT | Status: DC
  Administered 2018-04-10 – 2018-04-12 (×3): 18 ug via RESPIRATORY_TRACT
  Filled 2018-04-10 (×2): qty 5

## 2018-04-10 MED ORDER — MOMETASONE FURO-FORMOTEROL FUM 100-5 MCG/ACT IN AERO
2.0000 | INHALATION_SPRAY | Freq: Two times a day (BID) | RESPIRATORY_TRACT | Status: DC
  Administered 2018-04-10 – 2018-04-12 (×5): 2 via RESPIRATORY_TRACT
  Filled 2018-04-10: qty 8.8

## 2018-04-10 MED ORDER — METOPROLOL TARTRATE 5 MG/5ML IV SOLN
5.0000 mg | Freq: Once | INTRAVENOUS | Status: AC
  Administered 2018-04-10: 5 mg via INTRAVENOUS
  Filled 2018-04-10: qty 5

## 2018-04-10 NOTE — Plan of Care (Signed)
  Problem: Clinical Measurements: Goal: Ability to maintain clinical measurements within normal limits will improve Outcome: Not Progressing Note:  WBC's are still elevated at 11.8. Will continue to monitor lab values. Wenda Low [facility]

## 2018-04-10 NOTE — Progress Notes (Signed)
[place] at [place] NAME: Laura Gonzalez    MR#:  [id number]  DATE OF BIRTH:  1940-07-16  SUBJECTIVE:   Continues to have shortness of breath.  Dry cough.  No chest pain.  On 5 L oxygen.  Tachycardia in the 120s.  REVIEW OF SYSTEMS:  Review of Systems  Constitutional: Negative for chills and fever.  HENT: Negative for congestion and sore throat.   Eyes: Negative for blurred vision and double vision.  Respiratory: Positive for shortness of breath and wheezing. Negative for cough.   Cardiovascular: Negative for chest pain, palpitations and leg swelling.  Gastrointestinal: Negative for abdominal pain, nausea and vomiting.  Genitourinary: Negative for dysuria and frequency.  Musculoskeletal: Negative for back pain and myalgias.  Neurological: Negative for dizziness and headaches.  Psychiatric/Behavioral: Negative for depression. The patient is not nervous/anxious.    DRUG ALLERGIES:   Allergies  Allergen Reactions  . Amoxicillin Other (See Comments)    Lips swelling, tingling Has patient had a PCN reaction causing immediate rash, facial/tongue/throat swelling, SOB or lightheadedness with hypotension: Yes Has patient had a PCN reaction causing severe rash involving mucus membranes or skin necrosis: No Has patient had a PCN reaction that required hospitalization: No Has patient had a PCN reaction occurring within the last 10 years: No If all of the above answers are "NO", then may proceed with Cephalosporin use.   . Benzocaine-Menthol     Throat swelling  . Biafine [Wound Dressings] Swelling  . Chloraseptic Sore Throat [Acetaminophen] Other (See Comments)    throat swelling  . Chlorpheniramine     Throat swelling  . Neosporin [Neomycin-Bacitracin Zn-Polymyx] Other (See Comments)    Skin redness, puffy  . Shellfish Allergy Swelling    "lips swell"  . Statins Other (See Comments)    Muscle weakness, weak  . Tape Other (See Comments)     Skin redness   VITALS:  Blood pressure (!) 113/53, pulse 100, temperature 97.8 F (36.6 C), temperature source Oral, resp. rate (!) 24, height 5\' 5"  (1.651 m), weight 130.3 kg, SpO2 91 %. PHYSICAL EXAMINATION:  Physical Exam  Constitutional: Laying in bed, in respiratory distress HEENT: Normocephalic and atraumatic, oropharynx is clear and moist. Pupils are equal, round, and reactive to light. Conjunctivae and EOM are normal. No scleral icterus.  Neck: Normal range of motion. Neck supple. No JVD present. No thyromegaly present.  Cardiovascular: Irregularly irregular rhythm, regular rate, no murmurs, rubs or gallops Respiratory: Bilateral wheezing and crackles.  Increased work of breathing GI: Soft. +BS, non-tender, non-distended Musculoskeletal: warm and well-perfused, no edema or cyanosis Neurological: CN 2-12 intact, normal muscle strength, normal sensation, no focal deficits  Skin: Skin is warm and dry. No rash noted. No erythema.  Psychiatric: She has a normal mood and affect. Her behavior is normal. Judgment and thought content normal.  LABORATORY PANEL:  Female CBC Recent Labs  Lab 04/08/18 0537  WBC 11.8*  HGB 13.2  HCT 40.6  PLT 210   ------------------------------------------------------------------------------------------------------------------ Chemistries  Recent Labs  Lab 04/06/18 1913 04/06/18 2010  04/08/18 0537  NA 139  --    < > 139  K 4.1  --    < > 4.0  CL 104  --    < > 105  CO2 26  --    < > 25  GLUCOSE 121*  --    < > 117*  BUN 15  --    < > 14  CREATININE 0.76  --    < > 0.68  CALCIUM 9.8  --    < > 8.8*  MG  --  2.0  --   --   AST 29  --   --   --   ALT 32  --   --   --   ALKPHOS 66  --   --   --   BILITOT 0.7  --   --   --    < > = values in this interval not displayed.   RADIOLOGY:  No results found. ASSESSMENT AND PLAN:   * Acute hypoxic respiratory failure secondary to combination of CHF and bronchitis Wean oxygen as tolerated.  *  Acute on chronic diastolic congestive heart failure. Started Lasix.  Stat dose.  Monitor input and output.   Replace potassium as needed.  * Atrial fibrillation with rapid ventricular rate in the 120s intermittently On Cardizem and metoprolol.  Heart rate slowly improving.  * Sepsis secondary to acute bronchitis likely viral Not pneumonia Stopped meropenem.  Added azithromycin. Prednisone.  Nebulizers.  * Hypertension On Cardizem and metoprolol  All the records are reviewed and case discussed with Care Management/Social Worker. Management plans discussed with the patient, family and they are in agreement.  CODE STATUS: Full Code  TOTAL  TIME TAKING CARE OF THIS PATIENT: 32 minutes.   POSSIBLE D/C IN 1-2 DAYS, DEPENDING ON CLINICAL CONDITION.  Leia Alf Naina Sleeper M.D on 04/10/2018 at 12:26 PM  Between 7am to 6pm - Pager - [phone]  After 6pm go to [email or web address] - Proofreader  [employer] [facility] [place] [facility]  Office  [phone]  CC: Primary care physician; Baxter Hire, MD  Note: This dictation was prepared with Dragon dictation along with smaller phrase technology. Any transcriptional errors that result from this process are unintentional.

## 2018-04-10 NOTE — Progress Notes (Signed)
ANTICOAGULATION CONSULT NOTE - Follow Up Consult  Pharmacy Consult for warfarin dosing Indication: atrial fibrillation  Allergies  Allergen Reactions  . Amoxicillin Other (See Comments)    Lips swelling, tingling Has patient had a PCN reaction causing immediate rash, facial/tongue/throat swelling, SOB or lightheadedness with hypotension: Yes Has patient had a PCN reaction causing severe rash involving mucus membranes or skin necrosis: No Has patient had a PCN reaction that required hospitalization: No Has patient had a PCN reaction occurring within the last 10 years: No If all of the above answers are "NO", then may proceed with Cephalosporin use.   . Benzocaine-Menthol     Throat swelling  . Biafine [Wound Dressings] Swelling  . Chloraseptic Sore Throat [Acetaminophen] Other (See Comments)    throat swelling  . Chlorpheniramine     Throat swelling  . Neosporin [Neomycin-Bacitracin Zn-Polymyx] Other (See Comments)    Skin redness, puffy  . Shellfish Allergy Swelling    "lips swell"  . Statins Other (See Comments)    Muscle weakness, weak  . Tape Other (See Comments)    Skin redness    Patient Measurements: Height: 5\' 5"  (165.1 cm) Weight: 287 lb 3.2 oz (130.3 kg) IBW/kg (Calculated) : 57 Heparin Dosing Weight: n/a  Vital Signs: Temp: 98.2 F (36.8 C) (10/20 0333) Temp Source: Oral (10/20 0333) BP: 130/102 (10/20 0333) Pulse Rate: 103 (10/20 0333)  Labs: Recent Labs    04/08/18 0537 04/09/18 0457 04/10/18 0555  HGB 13.2  --   --   HCT 40.6  --   --   PLT 210  --   --   LABPROT 21.6* 21.9* 27.4*  INR 1.91 1.94 2.59  CREATININE 0.68  --   --     Estimated Creatinine Clearance: 81.5 mL/min (by C-G formula based on SCr of 0.68 mg/dL).   Medications:  Home dose 4 mg daily  Assessment: INR therapeutic on admission. Appears pt had a slightly supratherapeutic level (3.3) on 10/14 and was asked to hold one dose (10/15), but it appears pt did not get a dose here  on 10/16 either.   It appears pt has been stable on 4mg  for awhile.   10/16 INR 2.34 10/17 INR 2.10   Warfarin 4 mg 10/18 INR 1.91   Warfarin 6 mg 10/19 INR 1.94   Warfarin 4 mg 10/20 INR 2.59  Goal of Therapy:  INR 2-3   Plan:  Resume home dose of 4mg  daily.  Check INR daily while on ABX.  Pharmacy will continue to monitor and adjust as per protocol.   Olivia Canter, [facility] Clinical Pharmacist 04/10/2018,8:10 AM

## 2018-04-10 NOTE — Progress Notes (Signed)
[facility] Cardiology [facility] Encounter Note  Patient: Laura Gonzalez / Admit Date: 04/06/2018 / Date of Encounter: 04/10/2018, 6:57 AM   Subjective: Patient breathing is much better overnight in pain.  Patient is resting comfortably today.  Some cough and congestion still.  X-ray suggesting left lower lobe consolidation worse than before with possible effusion heart rate now is significantly improved overnight and more reasonable.  No evidence of significant congestive heart failure or acute coronary syndrome symptoms Echocardiogram showing normal LV systolic function without evidence of significant valvular heart disease Review of Systems: Positive for: Shortness of breath cough congestion Negative for: Vision change, hearing change, syncope, dizziness, nausea, vomiting,diarrhea, bloody stool, stomach pain, positive for cough, congestion, negative for headache for diaphoresis, urinary frequency, urinary pain,skin lesions, skin rashes Others previously listed  Objective: Telemetry: Atrial fibrillation with rapid ventricular rate Physical Exam: Blood pressure (!) 130/102, pulse (!) 103, temperature 98.2 F (36.8 C), temperature source Oral, resp. rate 19, height 5\' 5"  (1.651 m), weight 130.3 kg, SpO2 93 %. Body mass index is 47.79 kg/m. General: Well developed, well nourished, in no acute distress. Head: Normocephalic, atraumatic, sclera non-icteric, no xanthomas, nares are without discharge. Neck: No apparent masses Lungs: Normal respirations with some wheezes, few's rhonchi, no rales , few crackles   Heart: irregular rate and rhythm, normal S1 S2, no murmur, no rub, no gallop, PMI is normal size and placement, carotid upstroke normal without bruit, jugular venous pressure normal Abdomen: Soft, non-tender, non-distended with normoactive bowel sounds. No hepatosplenomegaly. Abdominal aorta is normal size without bruit Extremities: 1+ edema, no clubbing, no cyanosis, no ulcers,   Peripheral: 2+ radial, 2+ femoral, 2+ dorsal pedal pulses Neuro: Alert and oriented. Moves all extremities spontaneously. Psych:  Responds to questions appropriately with a normal affect.   Intake/Output Summary (Last 24 hours) at 04/10/2018 0657 Last data filed at 04/09/2018 2356 Gross per 24 hour  Intake 150 ml  Output 800 ml  Net -650 ml    Inpatient Medications:  . calcium-vitamin D  1 tablet Oral BID  . diltiazem  300 mg Oral Daily  . ezetimibe  10 mg Oral Daily  . furosemide  40 mg Oral Daily  . ipratropium-albuterol  3 mL Nebulization Q6H  . magnesium oxide  800 mg Oral Q1500  . metoprolol tartrate  25 mg Oral BID  . montelukast  10 mg Oral QHS  . predniSONE  50 mg Oral Q breakfast  . tiotropium  18 mcg Inhalation Daily  . warfarin  4 mg Oral q1800  . Warfarin - Pharmacist Dosing Inpatient   Does not apply q1800  . zinc sulfate  220 mg Oral Daily   Infusions:  . levofloxacin (LEVAQUIN) IV Stopped (04/09/18 1215)    Labs: Recent Labs    04/08/18 0537  NA 139  K 4.0  CL 105  CO2 25  GLUCOSE 117*  BUN 14  CREATININE 0.68  CALCIUM 8.8*   No results for input(s): AST, ALT, ALKPHOS, BILITOT, PROT, ALBUMIN in the last 72 hours. Recent Labs    04/08/18 0537  WBC 11.8*  HGB 13.2  HCT 40.6  MCV 95.3  PLT 210   No results for input(s): CKTOTAL, CKMB, TROPONINI in the last 72 hours. Invalid input(s): POCBNP No results for input(s): HGBA1C in the last 72 hours.   Weights: Filed Weights   04/06/18 1902 04/07/18 0124  Weight: 129.3 kg 130.3 kg     Radiology/Studies:  Dg Chest Port 1 View  Result Date:  04/09/2018 CLINICAL DATA:  Increasing cough and shortness of breath. EXAM: PORTABLE CHEST 1 VIEW COMPARISON:  Radiograph 06/06/2018 FINDINGS: Lower lung volumes from prior exam. Progressive left basilar opacity and blunting of left costophrenic angle. Streaky right lung base atelectasis is similar. Unchanged heart size and mediastinal contours.  Bronchovascular crowding related to low lung volumes, no convincing pulmonary edema. No pneumothorax. Detailed evaluation limited by habitus. IMPRESSION: Progressive lower lung volumes and increasing left basilar opacity which may represent progressive atelectasis, pneumonia or aspiration in the appropriate clinical setting. Possible left pleural effusion. Electronically Signed   By: Keith Rake M.D.   On: 04/09/2018 01:42   Dg Chest Port 1 View  Result Date: 04/06/2018 CLINICAL DATA:  Chest pain with shortness of breath and tachycardia EXAM: PORTABLE CHEST 1 VIEW COMPARISON:  09/23/2016 FINDINGS: Low volume chest with interstitial crowding and streaky opacity at the bases. No Kerley lines, effusion, or pneumothorax. Likely normal heart size, although limited assessment. IMPRESSION: Indistinct opacities at the bases with appearance and low volumes favoring atelectasis. Electronically Signed   By: Monte Fantasia M.D.   On: 04/06/2018 19:56     Assessment and Recommendation  77 y.o. female with known paroxysmal nonvalvular atrial fibrillation essential hypertension mixed hyperlipidemia with acute respiratory distress and/or pneumonia causing atrial fibrillation with rapid ventricular rate improving with multiple changes in medication management with worsening of lung congestion at this time 1.  Continue combination of diltiazem and metoprolol for heart rate control at 100 to 120 Bpm until patient has better resolution of respiratory failure which appears to be slightly better today 2.  Currently not expecting conversion to normal sinus rhythm until patient has full resolution of respiratory distress therefore currently has reasonable heart rate control due to condition 3.  Continue anticoagulation for further risk reduction in stroke with atrial fibrillation 4.  Further treatment of lung issues including improvements of atelectasis and respiratory difficulty with inhalers and better pulmonary  toilet 5.  Addition of Lasix for further treatment of possible effusion and/or diastolic dysfunction heart failure as able 6.  Begin ambulation with other medication management changes as necessary and okay for discharge to home from cardiac standpoint if heart rate reasonably controlled with follow-up later in the week for further adjustments  Signed, Serafina Royals M.D. FACC

## 2018-04-10 NOTE — Progress Notes (Addendum)
This RN went to give patient requested Ativan to help her breathing after the order was written and verified.  Patient refused pills at this time, stating she couldn't take them until she got Ativan and Spiriva. Since the patient is not confused, she was told to let me know when she could take them.

## 2018-04-11 ENCOUNTER — Encounter: Payer: Self-pay | Admitting: *Deleted

## 2018-04-11 LAB — CBC
HCT: 40.4 % (ref 36.0–46.0)
Hemoglobin: 12.7 g/dL (ref 12.0–15.0)
MCH: 30.7 pg (ref 26.0–34.0)
MCHC: 31.4 g/dL (ref 30.0–36.0)
MCV: 97.6 fL (ref 80.0–100.0)
PLATELETS: 270 10*3/uL (ref 150–400)
RBC: 4.14 MIL/uL (ref 3.87–5.11)
RDW: 13.8 % (ref 11.5–15.5)
WBC: 9.5 10*3/uL (ref 4.0–10.5)
nRBC: 0 % (ref 0.0–0.2)

## 2018-04-11 LAB — CULTURE, BLOOD (ROUTINE X 2)
CULTURE: NO GROWTH
Culture: NO GROWTH
SPECIAL REQUESTS: ADEQUATE
Special Requests: ADEQUATE

## 2018-04-11 LAB — BASIC METABOLIC PANEL
ANION GAP: 7 (ref 5–15)
BUN: 20 mg/dL (ref 8–23)
CALCIUM: 8.8 mg/dL — AB (ref 8.9–10.3)
CO2: 32 mmol/L (ref 22–32)
Chloride: 101 mmol/L (ref 98–111)
Creatinine, Ser: 0.64 mg/dL (ref 0.44–1.00)
GFR calc Af Amer: >60 mL/min (ref >60)
GFR calc non Af Amer: >60 mL/min (ref >60)
GLUCOSE: 110 mg/dL — AB (ref 70–99)
Potassium: 3.7 mmol/L (ref 3.5–5.1)
Sodium: 140 mmol/L (ref 135–145)

## 2018-04-11 LAB — PROTIME-INR
INR: 2.74
PROTHROMBIN TIME: 28.6 s — AB (ref 11.4–15.2)

## 2018-04-11 MED ORDER — PREDNISONE 20 MG PO TABS: 20.0000 mg | ORAL_TABLET | Freq: Every day | ORAL | Status: DC

## 2018-04-11 MED ORDER — GUAIFENESIN ER 600 MG PO TB12
600.0000 mg | ORAL_TABLET | Freq: Two times a day (BID) | ORAL | Status: DC
  Administered 2018-04-11 – 2018-04-12 (×2): 600 mg via ORAL
  Filled 2018-04-11 (×2): qty 1

## 2018-04-11 MED ORDER — METOPROLOL TARTRATE 50 MG PO TABS
50.0000 mg | ORAL_TABLET | Freq: Two times a day (BID) | ORAL | Status: DC
  Administered 2018-04-11 – 2018-04-12 (×2): 50 mg via ORAL
  Filled 2018-04-11 (×2): qty 1

## 2018-04-11 MED ORDER — PREDNISONE 20 MG PO TABS: 30.0000 mg | ORAL_TABLET | Freq: Every day | ORAL | Status: DC

## 2018-04-11 MED ORDER — ENSURE ENLIVE PO LIQD: 237.0000 mL | ORAL | Status: DC

## 2018-04-11 MED ORDER — PREDNISONE 20 MG PO TABS
40.0000 mg | ORAL_TABLET | Freq: Every day | ORAL | Status: AC
  Administered 2018-04-12: 40 mg via ORAL
  Filled 2018-04-11: qty 2

## 2018-04-11 MED ORDER — PREDNISONE 10 MG PO TABS: 10.0000 mg | ORAL_TABLET | Freq: Every day | ORAL | Status: DC

## 2018-04-11 MED ORDER — LEVOFLOXACIN 750 MG PO TABS: 750.0000 mg | ORAL_TABLET | Freq: Every day | ORAL | Status: DC

## 2018-04-11 NOTE — Progress Notes (Signed)
Physical Therapy Treatment Patient Details Name: Laura Gonzalez MRN: [id number] DOB: 05-28-1941 Today's Date: 04/11/2018    History of Present Illness Pt is a 77 y.o female presenting with SOB, tachycardia, and chest pain. Pt with afib with RVR and sepsis as well upon admissiong. PMH significant for HTN, afib, R TKR, skin cancer, and SOB. Pt has previously had HTN and afib under control with medication.     PT Comments    Pt is progressing toward goals. Pt educated on HEP including frequency and duration, visual aid provided. Pt demonstrates ability to transfer from lower surface today 2 times. Ambulation was limited d/t short O2 cord in room. Pt's O2 remained in 90s t/o session, HR elevated with activity in 110-120s. End of session pt's O2 was 96% and HR in 90s. Will continue to progress patient mobility/strength as able.   Follow Up Recommendations  Home health PT     Equipment Recommendations  None recommended by PT    Recommendations for Other Services       Precautions / Restrictions Precautions Precautions: Fall Restrictions Weight Bearing Restrictions: No    Mobility  Bed Mobility Overal bed mobility: Modified Independent             General bed mobility comments: Not assess this date, pt received in chair.  Transfers Overall transfer level: Needs assistance Equipment used: Rolling walker (2 wheeled) Transfers: Sit to/from Stand Sit to Stand: Min guard         General transfer comment: Min guard only for safety no physical assist requried, pt asymptomatic upon standing with no concerns for balance with RW. Can rise from lower surface today.  Ambulation/Gait Ambulation/Gait assistance: Min guard Gait Distance (Feet): 50 Feet Assistive device: Rolling walker (2 wheeled) Gait Pattern/deviations: Step-through pattern Gait velocity: decreased Gait velocity interpretation: <1.31 ft/sec, indicative of household ambulator General Gait Details: Pt with good  effort steady throughout ambulation, fatigued and breathing with increased effort noted. Noted decreased B foot clearance.   Stairs             Wheelchair Mobility    Modified Rankin (Stroke Patients Only)       Balance Overall balance assessment: Mild deficits observed, not formally tested                                          Cognition Arousal/Alertness: Awake/alert Behavior During Therapy: WFL for tasks assessed/performed Overall Cognitive Status: Within Functional Limits for tasks assessed                                 General Comments: No difficulties following commands      Exercises Other Exercises Other Exercises: Seated in recliner BLE AROM: ankle pumps, glute sets, SLRs, hip ABD/ADD, marching, and LAQs. All ther-ex performed 15 reps with VC for technique.    General Comments        Pertinent Vitals/Pain Pain Assessment: No/denies pain    Home Living                      Prior Function            PT Goals (current goals can now be found in the care plan section) Acute Rehab PT Goals Patient Stated Goal: to regain strength and get HR under control PT  Goal Formulation: With patient Time For Goal Achievement: 04/22/18 Potential to Achieve Goals: Good Progress towards PT goals: Progressing toward goals    Frequency    Min 2X/week      PT Plan Current plan remains appropriate    Co-evaluation              AM-PAC PT "6 Clicks" Daily Activity  Outcome Measure  Difficulty turning over in bed (including adjusting bedclothes, sheets and blankets)?: A Little Difficulty moving from lying on back to sitting on the side of the bed? : A Little Difficulty sitting down on and standing up from a chair with arms (e.g., wheelchair, bedside commode, etc,.)?: Unable Help needed moving to and from a bed to chair (including a wheelchair)?: A Little Help needed walking in hospital room?: A Little Help  needed climbing 3-5 steps with a railing? : A Little 6 Click Score: 16    End of Session Equipment Utilized During Treatment: Gait belt;Oxygen(3L/min) Activity Tolerance: Patient tolerated treatment well Patient left: in chair;with call bell/phone within reach;with chair alarm set Nurse Communication: Mobility status PT Visit Diagnosis: Muscle weakness (generalized) (M62.81);Pain;Difficulty in walking, not elsewhere classified (R26.2) Pain - Right/Left: Right Pain - part of body: Knee     Time: 2641-5830 PT Time Calculation (min) (ACUTE ONLY): 28 min  Charges:                        Algis Downs, SPT 04/11/2018, 12:08 PM

## 2018-04-11 NOTE — Plan of Care (Signed)
Decreased pt from 6L O2 to 4L O2, which is acute.   Problem: Education: Goal: Knowledge of General Education information will improve Description Including pain rating scale, medication(s)/side effects and non-pharmacologic comfort measures Outcome: Completed/Met   Problem: Activity: Goal: Risk for activity intolerance will decrease Outcome: Progressing Note:  Up in chair last night for a few hours, and getting up to [facility] with 1 assist, tolerating well.    Problem: Nutrition: Goal: Adequate nutrition will be maintained Outcome: Progressing   Problem: Coping: Goal: Level of anxiety will decrease Outcome: Progressing   Problem: Pain Managment: Goal: General experience of comfort will improve Outcome: Progressing Note:  No complaints of pain this shift    Problem: Safety: Goal: Ability to remain free from injury will improve Outcome: Progressing   Problem: Skin Integrity: Goal: Risk for impaired skin integrity will decrease Outcome: Progressing

## 2018-04-11 NOTE — Plan of Care (Signed)
Nutrition Education Note  RD consulted for nutrition education regarding CHF.  Met with pt in room today. Pt reports good appetite and oral intake today and pta. Pt reports being more of a grazer vs eating three large meals. Pt also reports that sometimes she skips lunch. Pt lives in a nursing facility and has very little control over the foods that she is served. Pt drinks one vanilla Ensure every morning.   RD provided "Low Sodium Nutrition Therapy" handout from the Academy of [employer]. Reviewed patient's dietary recall. Provided examples on ways to decrease sodium intake in diet. Discouraged intake of processed foods and use of salt shaker. Encouraged fresh fruits and vegetables as well as whole grain sources of carbohydrates to maximize fiber intake.   RD discussed why it is important for patient to adhere to diet recommendations, and emphasized the role of fluids, foods to avoid, and importance of weighing self daily. Teach back method used.  Expect fair compliance.  Body mass index is 47.53 kg/m. Pt meets criteria for morbid obesity based on current BMI.  Current diet order is HH, patient is consuming approximately 100% of meals at this time. Labs and medications reviewed. No further nutrition interventions warranted at this time. RD contact information provided. If additional nutrition issues arise, please re-consult RD.   Koleen Distance MS, RD, LDN Pager #- [phone] Office#- [phone] After Hours Pager: [phone]

## 2018-04-11 NOTE — Progress Notes (Signed)
[facility] NAME: Laura Gonzalez    MR#:  [id number]  DATE OF BIRTH:  February 11, 1941  SUBJECTIVE:   Patient seen and evaluated today Has shortness of breath and wheezing Currently on oxygen via nasal cannula at 3 L Heart rate goes up to 120s whenever patient ambulates   REVIEW OF SYSTEMS:  Review of Systems  Constitutional: Negative for chills and fever.  HENT: Negative for congestion and sore throat.   Eyes: Negative for blurred vision and double vision.  Respiratory: Positive for shortness of breath and wheezing. Negative for cough.   Cardiovascular: Negative for chest pain, palpitations and leg swelling.  Gastrointestinal: Negative for abdominal pain, nausea and vomiting.  Genitourinary: Negative for dysuria and frequency.  Musculoskeletal: Negative for back pain and myalgias.  Neurological: Negative for dizziness and headaches.  Psychiatric/Behavioral: Negative for depression. The patient is not nervous/anxious.    DRUG ALLERGIES:   Allergies  Allergen Reactions  . Amoxicillin Other (See Comments)    Lips swelling, tingling Has patient had a PCN reaction causing immediate rash, facial/tongue/throat swelling, SOB or lightheadedness with hypotension: Yes Has patient had a PCN reaction causing severe rash involving mucus membranes or skin necrosis: No Has patient had a PCN reaction that required hospitalization: No Has patient had a PCN reaction occurring within the last 10 years: No If all of the above answers are "NO", then may proceed with Cephalosporin use.   . Benzocaine-Menthol     Throat swelling  . Biafine [Wound Dressings] Swelling  . Chloraseptic Sore Throat [Acetaminophen] Other (See Comments)    throat swelling  . Chlorpheniramine     Throat swelling  . Neosporin [Neomycin-Bacitracin Zn-Polymyx] Other (See Comments)    Skin redness, puffy  . Shellfish Allergy Swelling    "lips swell"  . Statins Other (See  Comments)    Muscle weakness, weak  . Tape Other (See Comments)    Skin redness   VITALS:  Blood pressure 116/66, pulse (!) 121, temperature 98.4 F (36.9 C), resp. rate 18, height 5\' 5"  (1.651 m), weight 130.3 kg, SpO2 94 %. PHYSICAL EXAMINATION:  Physical Exam  Constitutional: Laying in bed, in respiratory distress HEENT: Normocephalic and atraumatic, oropharynx is clear and moist. Pupils are equal, round, and reactive to light. Conjunctivae and EOM are normal. No scleral icterus.  Neck: Normal range of motion. Neck supple. No JVD present. No thyromegaly present.  Cardiovascular: Irregularly irregular rhythm, regular rate, no murmurs, rubs or gallops Respiratory: Bilateral wheezing and crackles.  Increased work of breathing GI: Soft. +BS, non-tender, non-distended Musculoskeletal: warm and well-perfused, no edema or cyanosis Neurological: CN 2-12 intact, normal muscle strength, normal sensation, no focal deficits  Skin: Skin is warm and dry. No rash noted. No erythema.  Psychiatric: She has a normal mood and affect. Her behavior is normal. Judgment and thought content normal.  LABORATORY PANEL:  Female CBC Recent Labs  Lab 04/11/18 0410  WBC 9.5  HGB 12.7  HCT 40.4  PLT 270   ------------------------------------------------------------------------------------------------------------------ Chemistries  Recent Labs  Lab 04/06/18 1913 04/06/18 2010  04/11/18 0409  NA 139  --    < > 140  K 4.1  --    < > 3.7  CL 104  --    < > 101  CO2 26  --    < > 32  GLUCOSE 121*  --    < > 110*  BUN 15  --    < >  20  CREATININE 0.76  --    < > 0.64  CALCIUM 9.8  --    < > 8.8*  MG  --  2.0  --   --   AST 29  --   --   --   ALT 32  --   --   --   ALKPHOS 66  --   --   --   BILITOT 0.7  --   --   --    < > = values in this interval not displayed.   RADIOLOGY:  No results found. ASSESSMENT AND PLAN:   - Acute hypoxic respiratory failure secondary to combination of CHF and  bronchitis Wean oxygen as tolerated. Currently on 3 L via nasal cannula  - Acute on chronic diastolic congestive heart failure. Diuresis with Lasix.  - Atrial fibrillation with rapid ventricular rate in the 120s intermittently On Cardizem and metoprolol.  Heart rate slowly improving slowly Increased dose of metoprolol to 50 mg orally twice daily  - Sepsis ruled out  acute bronchitis  No evidence of pneumonia Stopped meropenem.  Added azithromycin. Prednisone to continue.  Nebulizers.  - Hypertension On Cardizem and metoprolol  All the records are reviewed and case discussed with Care Management/Social Worker. Management plans discussed with the patient, family and they are in agreement.  CODE STATUS: Full Code  TOTAL  TIME TAKING CARE OF THIS PATIENT: 32 minutes.   POSSIBLE D/C IN 1-2 DAYS, DEPENDING ON CLINICAL CONDITION.  Saundra Shelling M.D on 04/11/2018 at 1:35 PM  Between 7am to 6pm - Pager - [phone]  After 6pm go to [email or web address] - Proofreader  [facility]  Hospitalists  Office  [phone]  CC: Primary care physician; Baxter Hire, MD  Note: This dictation was prepared with Dragon dictation along with smaller phrase technology. Any transcriptional errors that result from this process are unintentional.

## 2018-04-11 NOTE — Care Management Important Message (Signed)
Copy of signed IM left with patient in room.  

## 2018-04-11 NOTE — Progress Notes (Signed)
ANTICOAGULATION CONSULT NOTE - Follow Up Consult  Pharmacy Consult for warfarin dosing Indication: atrial fibrillation  Allergies  Allergen Reactions  . Amoxicillin Other (See Comments)    Lips swelling, tingling Has patient had a PCN reaction causing immediate rash, facial/tongue/throat swelling, SOB or lightheadedness with hypotension: Yes Has patient had a PCN reaction causing severe rash involving mucus membranes or skin necrosis: No Has patient had a PCN reaction that required hospitalization: No Has patient had a PCN reaction occurring within the last 10 years: No If all of the above answers are "NO", then may proceed with Cephalosporin use.   . Benzocaine-Menthol     Throat swelling  . Biafine [Wound Dressings] Swelling  . Chloraseptic Sore Throat [Acetaminophen] Other (See Comments)    throat swelling  . Chlorpheniramine     Throat swelling  . Neosporin [Neomycin-Bacitracin Zn-Polymyx] Other (See Comments)    Skin redness, puffy  . Shellfish Allergy Swelling    "lips swell"  . Statins Other (See Comments)    Muscle weakness, weak  . Tape Other (See Comments)    Skin redness    Patient Measurements: Height: 5\' 5"  (165.1 cm) Weight: 287 lb 3.2 oz (130.3 kg) IBW/kg (Calculated) : 57 Heparin Dosing Weight: n/a  Vital Signs: Temp: 98.2 F (36.8 C) (10/21 0256) Temp Source: Oral (10/21 0256) BP: 121/75 (10/21 0256) Pulse Rate: 107 (10/21 0256)  Labs: Recent Labs    04/09/18 0457 04/10/18 0555 04/11/18 0409  LABPROT 21.9* 27.4* 28.6*  INR 1.94 2.59 2.74  CREATININE  --   --  0.64    Estimated Creatinine Clearance: 81.5 mL/min (by C-G formula based on SCr of 0.64 mg/dL).   Medications:  Home dose 4 mg daily  Assessment: INR therapeutic on admission. Appears pt had a slightly supratherapeutic level (3.3) on 10/14 and was asked to hold one dose (10/15), but it appears pt did not get a dose here on 10/16 either.   It appears pt has been stable on 4mg  for  awhile.   10/16 INR 2.34 10/17 INR 2.10   Warfarin 4 mg 10/18 INR 1.91   Warfarin 6 mg 10/19 INR 1.94   Warfarin 4 mg 10/20 INR 2.59   Warfarin 4 mg  10/21 INR 2.74   Warfarin 4 mg  Goal of Therapy:  INR 2-3   Plan:  Resume home dose of 4mg  daily.  Check INR daily while on ABX.  Pharmacy will continue to monitor and adjust as per protocol.   Oswald Hillock, [facility] Clinical Pharmacist 04/11/2018,7:32 AM

## 2018-04-11 NOTE — Care Management Note (Signed)
Case Management Note  Patient Details  Name: KALLE BERNATH MRN: [id number] Date of Birth: February 28, 1941  Subjective/Objective:     Patient offered choice for home health PT.  Has used [facility] in the past.  She was very happy with them.  Made referral per patient to [facility] with Alvis Lemmings; he accepted.  Will notify him on discharge.                   Action/Plan:No further needs from [facility]  at this time.   Expected Discharge Date:                  Expected Discharge Plan:  [place]  In-House Referral:     Discharge planning Services  CM Consult  Post Acute Care Choice:    Choice offered to:     DME Arranged:    DME Agency:     HH Arranged:  PT HH Agency:  [facility]  Status of Service:  Completed, signed off  If discussed at [place] of Stay Meetings, dates discussed:    Additional Comments:  Elza Rafter, RN 04/11/2018, 11:43 AM

## 2018-04-12 LAB — PROTIME-INR
INR: 2.91
Prothrombin Time: 30 seconds — ABNORMAL HIGH (ref 11.4–15.2)

## 2018-04-12 MED ORDER — FUROSEMIDE 40 MG PO TABS: 40.0000 mg | ORAL_TABLET | Freq: Every day | ORAL | 0 refills | Status: DC

## 2018-04-12 MED ORDER — PREDNISONE 10 MG PO TABS: 10.0000 mg | ORAL_TABLET | Freq: Every day | ORAL | 0 refills | Status: DC

## 2018-04-12 MED ORDER — DOCUSATE SODIUM 100 MG PO CAPS
100.0000 mg | ORAL_CAPSULE | Freq: Two times a day (BID) | ORAL | Status: DC
  Administered 2018-04-12: 100 mg via ORAL
  Filled 2018-04-12: qty 1

## 2018-04-12 MED ORDER — BISACODYL 10 MG RE SUPP: 10.0000 mg | Freq: Every day | RECTAL | Status: DC | PRN

## 2018-04-12 MED ORDER — METOPROLOL TARTRATE 50 MG PO TABS: 50.0000 mg | ORAL_TABLET | Freq: Two times a day (BID) | ORAL | 0 refills | Status: DC

## 2018-04-12 MED ORDER — FLEET ENEMA 7-19 GM/118ML RE ENEM: 1.0000 | ENEMA | Freq: Every day | RECTAL | Status: DC | PRN

## 2018-04-12 MED ORDER — POTASSIUM CHLORIDE 20 MEQ PO PACK: 20.0000 meq | PACK | Freq: Two times a day (BID) | ORAL | 0 refills | Status: DC

## 2018-04-12 MED ORDER — POLYETHYLENE GLYCOL 3350 17 G PO PACK
17.0000 g | PACK | Freq: Every day | ORAL | Status: DC
  Administered 2018-04-12: 17 g via ORAL
  Filled 2018-04-12: qty 1

## 2018-04-12 NOTE — Progress Notes (Signed)
SATURATION QUALIFICATIONS: (This note is used to comply with regulatory documentation for home oxygen)  Patient Saturations on Room Air at Rest =90%  Patient Saturations on Room Air while Ambulating = 87%  Patient Saturations on 2 Liters of oxygen while Ambulating = 90%  Please briefly explain why patient needs home oxygen: 
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
## 2018-04-12 NOTE — Plan of Care (Signed)
  Problem: Clinical Measurements: Goal: Will remain free from infection Outcome: Not Progressing Note:  Remains afebrile, on antibiotics Goal: Respiratory complications will improve Outcome: Not Progressing Note:  Remains on 3LO2 per [place],    Problem: Activity: Goal: Ability to tolerate increased activity will improve Outcome: Not Progressing Note:  Still short of breath on exertion, 3LO2 per [place]

## 2018-04-12 NOTE — Progress Notes (Signed)
Pt discharged to home via wc.  Instructions and rx given to pt.  Questions answered.  No distress.  

## 2018-04-12 NOTE — Progress Notes (Signed)
ANTICOAGULATION CONSULT NOTE - Follow Up Consult  Pharmacy Consult for warfarin dosing Indication: atrial fibrillation  Allergies  Allergen Reactions  . Amoxicillin Other (See Comments)    Lips swelling, tingling Has patient had a PCN reaction causing immediate rash, facial/tongue/throat swelling, SOB or lightheadedness with hypotension: Yes Has patient had a PCN reaction causing severe rash involving mucus membranes or skin necrosis: No Has patient had a PCN reaction that required hospitalization: No Has patient had a PCN reaction occurring within the last 10 years: No If all of the above answers are "NO", then may proceed with Cephalosporin use.   . Benzocaine-Menthol     Throat swelling  . Biafine [Wound Dressings] Swelling  . Chloraseptic Sore Throat [Acetaminophen] Other (See Comments)    throat swelling  . Chlorpheniramine     Throat swelling  . Neosporin [Neomycin-Bacitracin Zn-Polymyx] Other (See Comments)    Skin redness, puffy  . Shellfish Allergy Swelling    "lips swell"  . Statins Other (See Comments)    Muscle weakness, weak  . Tape Other (See Comments)    Skin redness    Patient Measurements: Height: 5\' 5"  (165.1 cm) Weight: 282 lb 4.8 oz (128.1 kg) IBW/kg (Calculated) : 57 Heparin Dosing Weight: n/a  Vital Signs: Temp: 97.9 F (36.6 C) (10/22 0503) Temp Source: Oral (10/22 0503) BP: 123/78 (10/22 0503) Pulse Rate: 100 (10/22 0503)  Labs: Recent Labs    04/10/18 0555 04/11/18 0409 04/11/18 0410 04/12/18 0354  HGB  --   --  12.7  --   HCT  --   --  40.4  --   PLT  --   --  270  --   LABPROT 27.4* 28.6*  --  30.0*  INR 2.59 2.74  --  2.91  CREATININE  --  0.64  --   --     Estimated Creatinine Clearance: 80.7 mL/min (by C-G formula based on SCr of 0.64 mg/dL).   Medications:  Home dose 4 mg daily  Assessment: INR therapeutic on admission. Appears pt had a slightly supratherapeutic level (3.3) on 10/14 and was asked to hold one dose  (10/15), but it appears pt did not get a dose here on 10/16 either.   It appears pt has been stable on 4mg  for awhile.   10/16 INR 2.34 10/17 INR 2.10   Warfarin 4 mg 10/18 INR 1.91   Warfarin 6 mg 10/19 INR 1.94   Warfarin 4 mg 10/20 INR 2.59   Warfarin 4 mg  10/21 INR 2.74   Warfarin 4 mg 10/22 INR 2.91   Warfarin 4 mg  Goal of Therapy:  INR 2-3   Plan:  Will continue home dose of 4 mg. Drug interaction with levofloxacin - which can increase INR but d/c yesterday. Pt should be at steady state.   Pharmacy will continue to monitor and adjust as per protocol.   Oswald Hillock, [facility] Clinical Pharmacist 04/12/2018,7:15 AM

## 2018-04-12 NOTE — Plan of Care (Signed)
  Problem: Activity: Goal: Risk for activity intolerance will decrease Outcome: Progressing Note:  Sat in chair for a few hours until bedtime tonight, up to [facility] with 1 assist, tolerating well   Problem: Nutrition: Goal: Adequate nutrition will be maintained Outcome: Progressing   Problem: Coping: Goal: Level of anxiety will decrease Outcome: Progressing   Problem: Elimination: Goal: Will not experience complications related to urinary retention Outcome: Progressing   Problem: Pain Managment: Goal: General experience of comfort will improve Outcome: Progressing Note:  No complaints of pain this shift   Problem: Safety: Goal: Ability to remain free from injury will improve Outcome: Progressing   Problem: Skin Integrity: Goal: Risk for impaired skin integrity will decrease Outcome: Progressing

## 2018-04-12 NOTE — Care Management Note (Signed)
Case Management Note  Patient Details  Name: Laura Gonzalez MRN: [id number] Date of Birth: May 01, 1941  Subjective/Objective:     Patient has qualified for home O2.  [facility] will bring equipment prior to discharge.               Action/Plan:   Expected Discharge Date:  04/12/18               Expected Discharge Plan:  [place]  In-House Referral:     Discharge planning Services  CM Consult  Post Acute Care Choice:  Durable Medical Equipment, Home Health Choice offered to:  Patient  DME Arranged:  Oxygen DME Agency:  [place] Arranged:  PT [place]:  [place]  Status of Service:  Completed, signed off  If discussed at [place] of Stay Meetings, dates discussed:    Additional Comments:  Elza Rafter, RN 04/12/2018, 11:45 AM

## 2018-04-21 ENCOUNTER — Ambulatory Visit: Payer: Medicare Other | Attending: Family | Admitting: Family

## 2018-04-21 ENCOUNTER — Encounter: Payer: Self-pay | Admitting: Family

## 2018-04-21 VITALS — BP 112/64 | HR 62 | Resp 18 | Ht 65.0 in | Wt 276.4 lb

## 2018-04-21 DIAGNOSIS — Z85828 Personal history of other malignant neoplasm of skin: Secondary | ICD-10-CM | POA: Diagnosis not present

## 2018-04-21 DIAGNOSIS — Z7983 Long term (current) use of bisphosphonates: Secondary | ICD-10-CM | POA: Insufficient documentation

## 2018-04-21 DIAGNOSIS — Z96651 Presence of right artificial knee joint: Secondary | ICD-10-CM | POA: Insufficient documentation

## 2018-04-21 DIAGNOSIS — Z803 Family history of malignant neoplasm of breast: Secondary | ICD-10-CM | POA: Diagnosis not present

## 2018-04-21 DIAGNOSIS — G4733 Obstructive sleep apnea (adult) (pediatric): Secondary | ICD-10-CM | POA: Diagnosis not present

## 2018-04-21 DIAGNOSIS — Z79899 Other long term (current) drug therapy: Secondary | ICD-10-CM | POA: Diagnosis not present

## 2018-04-21 DIAGNOSIS — J45909 Unspecified asthma, uncomplicated: Secondary | ICD-10-CM | POA: Diagnosis not present

## 2018-04-21 DIAGNOSIS — Z8261 Family history of arthritis: Secondary | ICD-10-CM | POA: Insufficient documentation

## 2018-04-21 DIAGNOSIS — Z888 Allergy status to other drugs, medicaments and biological substances status: Secondary | ICD-10-CM | POA: Diagnosis not present

## 2018-04-21 DIAGNOSIS — E785 Hyperlipidemia, unspecified: Secondary | ICD-10-CM | POA: Diagnosis not present

## 2018-04-21 DIAGNOSIS — I5032 Chronic diastolic (congestive) heart failure: Secondary | ICD-10-CM | POA: Insufficient documentation

## 2018-04-21 DIAGNOSIS — I4891 Unspecified atrial fibrillation: Secondary | ICD-10-CM | POA: Diagnosis not present

## 2018-04-21 DIAGNOSIS — Z91013 Allergy to seafood: Secondary | ICD-10-CM | POA: Insufficient documentation

## 2018-04-21 DIAGNOSIS — Z8249 Family history of ischemic heart disease and other diseases of the circulatory system: Secondary | ICD-10-CM | POA: Diagnosis not present

## 2018-04-21 DIAGNOSIS — Z981 Arthrodesis status: Secondary | ICD-10-CM | POA: Diagnosis not present

## 2018-04-21 DIAGNOSIS — K219 Gastro-esophageal reflux disease without esophagitis: Secondary | ICD-10-CM | POA: Insufficient documentation

## 2018-04-21 DIAGNOSIS — I110 Hypertensive heart disease with heart failure: Secondary | ICD-10-CM | POA: Diagnosis not present

## 2018-04-21 DIAGNOSIS — Z7982 Long term (current) use of aspirin: Secondary | ICD-10-CM | POA: Insufficient documentation

## 2018-04-21 DIAGNOSIS — I509 Heart failure, unspecified: Secondary | ICD-10-CM | POA: Diagnosis present

## 2018-04-21 DIAGNOSIS — Z806 Family history of leukemia: Secondary | ICD-10-CM | POA: Insufficient documentation

## 2018-04-21 DIAGNOSIS — Z7901 Long term (current) use of anticoagulants: Secondary | ICD-10-CM | POA: Insufficient documentation

## 2018-04-21 DIAGNOSIS — Z880 Allergy status to penicillin: Secondary | ICD-10-CM | POA: Insufficient documentation

## 2018-04-21 DIAGNOSIS — I10 Essential (primary) hypertension: Secondary | ICD-10-CM

## 2018-04-21 DIAGNOSIS — M810 Age-related osteoporosis without current pathological fracture: Secondary | ICD-10-CM | POA: Insufficient documentation

## 2018-04-21 MED ORDER — FUROSEMIDE 40 MG PO TABS: 40.0000 mg | ORAL_TABLET | Freq: Every day | ORAL | 0 refills | Status: DC
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
## 2018-04-21 NOTE — Patient Instructions (Signed)
Continue weighing daily and call for an overnight weight gain of > 2 pounds or a weekly weight gain of >5 pounds. 

## 2018-04-22 ENCOUNTER — Encounter: Payer: Self-pay | Admitting: Family

## 2018-04-22 DIAGNOSIS — I5032 Chronic diastolic (congestive) heart failure: Secondary | ICD-10-CM | POA: Insufficient documentation
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
## 2018-05-27 ENCOUNTER — Encounter: Payer: Self-pay | Admitting: Family

## 2018-05-27 ENCOUNTER — Ambulatory Visit: Payer: Medicare Other | Attending: Family | Admitting: Family

## 2018-05-27 VITALS — BP 105/83 | HR 72 | Resp 18 | Ht 65.0 in | Wt 276.4 lb

## 2018-05-27 DIAGNOSIS — G4733 Obstructive sleep apnea (adult) (pediatric): Secondary | ICD-10-CM | POA: Insufficient documentation

## 2018-05-27 DIAGNOSIS — Z8249 Family history of ischemic heart disease and other diseases of the circulatory system: Secondary | ICD-10-CM | POA: Insufficient documentation

## 2018-05-27 DIAGNOSIS — I110 Hypertensive heart disease with heart failure: Secondary | ICD-10-CM | POA: Diagnosis not present

## 2018-05-27 DIAGNOSIS — K219 Gastro-esophageal reflux disease without esophagitis: Secondary | ICD-10-CM | POA: Insufficient documentation

## 2018-05-27 DIAGNOSIS — Z85828 Personal history of other malignant neoplasm of skin: Secondary | ICD-10-CM | POA: Insufficient documentation

## 2018-05-27 DIAGNOSIS — I5032 Chronic diastolic (congestive) heart failure: Secondary | ICD-10-CM | POA: Diagnosis present

## 2018-05-27 DIAGNOSIS — E785 Hyperlipidemia, unspecified: Secondary | ICD-10-CM | POA: Insufficient documentation

## 2018-05-27 DIAGNOSIS — G5600 Carpal tunnel syndrome, unspecified upper limb: Secondary | ICD-10-CM | POA: Diagnosis not present

## 2018-05-27 DIAGNOSIS — Z96651 Presence of right artificial knee joint: Secondary | ICD-10-CM | POA: Insufficient documentation

## 2018-05-27 DIAGNOSIS — M25561 Pain in right knee: Secondary | ICD-10-CM | POA: Diagnosis not present

## 2018-05-27 DIAGNOSIS — Z888 Allergy status to other drugs, medicaments and biological substances status: Secondary | ICD-10-CM | POA: Diagnosis not present

## 2018-05-27 DIAGNOSIS — I4891 Unspecified atrial fibrillation: Secondary | ICD-10-CM | POA: Insufficient documentation

## 2018-05-27 DIAGNOSIS — Z880 Allergy status to penicillin: Secondary | ICD-10-CM | POA: Diagnosis not present

## 2018-05-27 DIAGNOSIS — Z7982 Long term (current) use of aspirin: Secondary | ICD-10-CM | POA: Diagnosis not present

## 2018-05-27 DIAGNOSIS — I10 Essential (primary) hypertension: Secondary | ICD-10-CM

## 2018-05-27 DIAGNOSIS — Z7901 Long term (current) use of anticoagulants: Secondary | ICD-10-CM | POA: Diagnosis not present

## 2018-05-27 DIAGNOSIS — J45909 Unspecified asthma, uncomplicated: Secondary | ICD-10-CM | POA: Diagnosis not present

## 2018-05-27 DIAGNOSIS — Z79899 Other long term (current) drug therapy: Secondary | ICD-10-CM | POA: Diagnosis not present

## 2018-05-27 NOTE — Patient Instructions (Signed)
Continue weighing daily and call for an overnight weight gain of > 2 pounds or a weekly weight gain of >5 pounds. 

## 2018-07-18 ENCOUNTER — Other Ambulatory Visit: Payer: Self-pay | Admitting: Family

## 2018-09-23 ENCOUNTER — Encounter (INDEPENDENT_AMBULATORY_CARE_PROVIDER_SITE_OTHER): Payer: Medicare Other

## 2018-09-26 ENCOUNTER — Encounter (INDEPENDENT_AMBULATORY_CARE_PROVIDER_SITE_OTHER): Payer: Medicare Other

## 2018-09-26 ENCOUNTER — Ambulatory Visit (INDEPENDENT_AMBULATORY_CARE_PROVIDER_SITE_OTHER): Payer: Medicare Other | Admitting: Nurse Practitioner

## 2018-11-23 ENCOUNTER — Telehealth: Payer: Self-pay

## 2018-11-23 NOTE — Telephone Encounter (Signed)
TELEPHONE CALL NOTE  Laura Gonzalez has been deemed a candidate for a follow-up tele-health visit to limit community exposure during the Covid-19 pandemic. I spoke with the patient via phone to ensure availability of phone/video source, confirm preferred email & phone number, discuss instructions and expectations, and review consent.   I reminded Laura Gonzalez to be prepared with any vital sign and/or heart rhythm information that could potentially be obtained via home monitoring, at the time of her visit.  Finally, I reminded Laura Gonzalez to expect an e-mail containing a link for their video-based visit approximately 15 minutes before her visit, or alternatively, a phone call at the time of her visit if her visit is planned to be a phone encounter.  Did the patient verbally consent to treatment as below? YES  Gaylord Shih, CMA 11/23/2018 9:21 AM  CONSENT FOR TELE-HEALTH VISIT - PLEASE REVIEW  I hereby voluntarily request, consent and authorize [facility] and its employed or contracted physicians, physician assistants, nurse practitioners or other licensed health care professionals (the Practitioner), to provide me with telemedicine health care services (the "Services") as deemed necessary by the treating Practitioner. I acknowledge and consent to receive the Services by the Practitioner via telemedicine. I understand that the telemedicine visit will involve communicating with the Practitioner through telephonic communication technology and the disclosure of certain medical information by electronic transmission. I acknowledge that I have been given the opportunity to request an in-person assessment or other available alternative prior to the telemedicine visit and am voluntarily participating in the telemedicine visit.  I understand that I have the right to withhold or withdraw my consent to the use of telemedicine in the course of my care at any time, without affecting my right  to future care or treatment, and that the Practitioner or I may terminate the telemedicine visit at any time. I understand that I have the right to inspect all information obtained and/or recorded in the course of the telemedicine visit and may receive copies of available information for a reasonable fee.  I understand that some of the potential risks of receiving the Services via telemedicine include:  Marland Kitchen Delay or interruption in medical evaluation due to technological equipment failure or disruption; . Information transmitted may not be sufficient (e.g. poor resolution of images) to allow for appropriate medical decision making by the Practitioner; and/or  . In rare instances, security protocols could fail, causing a breach of personal health information.  Furthermore, I acknowledge that it is my responsibility to provide information about my medical history, conditions and care that is complete and accurate to the best of my ability. I acknowledge that Practitioner's advice, recommendations, and/or decision may be based on factors not within their control, such as incomplete or inaccurate data provided by me or lack of visual representation. I understand that the practice of medicine is not an exact science and that Practitioner makes no warranties or guarantees regarding treatment outcomes. I acknowledge that I will receive a copy of this consent concurrently upon execution via email to the email address I last provided but may also request a printed copy by calling the office of [facility].    I understand that my insurance may be billed for this visit.   I have read or had this consent read to me. . I understand the contents of this consent, which adequately explains the benefits and risks of the Services being provided via telemedicine.  Marland Kitchen  I have been provided ample opportunity to ask questions regarding this consent and the Services and have had my questions answered to my  satisfaction. . I give my informed consent for the services to be provided through the use of telemedicine in my medical care  By participating in this telemedicine visit I agree to the above.

## 2018-11-23 NOTE — Telephone Encounter (Signed)
   TELEPHONE CALL NOTE  This patient has been deemed a candidate for follow-up tele-health visit to limit community exposure during the Covid-19 pandemic. I spoke with the patient via phone to discuss instructions. The patient was advised to review the section on consent for treatment as well. The patient will receive a phone call 2-3 days prior to their E-Visit at which time consent will be verbally confirmed. A Virtual Office Visit appointment type has been scheduled for 11/25/2018 with Darylene Price FNP.  Adalyna Godbee L, CMA 11/23/2018 9:20 AM

## 2018-11-25 ENCOUNTER — Encounter: Payer: Self-pay | Admitting: Family

## 2018-11-25 ENCOUNTER — Ambulatory Visit: Payer: Medicare Other | Attending: Family | Admitting: Family

## 2018-11-25 ENCOUNTER — Other Ambulatory Visit: Payer: Self-pay

## 2018-11-25 VITALS — Wt 282.3 lb

## 2018-11-25 DIAGNOSIS — I10 Essential (primary) hypertension: Secondary | ICD-10-CM

## 2018-11-25 DIAGNOSIS — G4733 Obstructive sleep apnea (adult) (pediatric): Secondary | ICD-10-CM

## 2018-11-25 DIAGNOSIS — I5032 Chronic diastolic (congestive) heart failure: Secondary | ICD-10-CM

## 2018-11-25 NOTE — Patient Instructions (Signed)
Continue weighing daily and call for an overnight weight gain of > 2 pounds or a weekly weight gain of >5 pounds. 
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
## 2018-12-29 ENCOUNTER — Observation Stay
Admission: EM | Admit: 2018-12-29 | Discharge: 2018-12-30 | Disposition: A | Discharge status: Home / Self Care | Payer: Medicare Other | Source: Home / Self Care | Attending: Emergency Medicine | Admitting: Emergency Medicine

## 2018-12-29 ENCOUNTER — Emergency Department: Payer: Medicare Other

## 2018-12-29 ENCOUNTER — Other Ambulatory Visit: Payer: Self-pay

## 2018-12-29 ENCOUNTER — Encounter: Payer: Self-pay | Admitting: Emergency Medicine

## 2018-12-29 DIAGNOSIS — I739 Peripheral vascular disease, unspecified: Secondary | ICD-10-CM | POA: Insufficient documentation

## 2018-12-29 DIAGNOSIS — Z85828 Personal history of other malignant neoplasm of skin: Secondary | ICD-10-CM | POA: Insufficient documentation

## 2018-12-29 DIAGNOSIS — I4891 Unspecified atrial fibrillation: Secondary | ICD-10-CM | POA: Insufficient documentation

## 2018-12-29 DIAGNOSIS — Z801 Family history of malignant neoplasm of trachea, bronchus and lung: Secondary | ICD-10-CM | POA: Insufficient documentation

## 2018-12-29 DIAGNOSIS — Z1159 Encounter for screening for other viral diseases: Secondary | ICD-10-CM | POA: Insufficient documentation

## 2018-12-29 DIAGNOSIS — R0989 Other specified symptoms and signs involving the circulatory and respiratory systems: Secondary | ICD-10-CM | POA: Insufficient documentation

## 2018-12-29 DIAGNOSIS — I5032 Chronic diastolic (congestive) heart failure: Secondary | ICD-10-CM | POA: Insufficient documentation

## 2018-12-29 DIAGNOSIS — Z800 Family history of malignant neoplasm of digestive organs: Secondary | ICD-10-CM | POA: Insufficient documentation

## 2018-12-29 DIAGNOSIS — I4819 Other persistent atrial fibrillation: Secondary | ICD-10-CM | POA: Diagnosis not present

## 2018-12-29 DIAGNOSIS — Z6841 Body Mass Index (BMI) 40.0 and over, adult: Secondary | ICD-10-CM | POA: Insufficient documentation

## 2018-12-29 DIAGNOSIS — S81809A Unspecified open wound, unspecified lower leg, initial encounter: Secondary | ICD-10-CM | POA: Insufficient documentation

## 2018-12-29 DIAGNOSIS — Z8249 Family history of ischemic heart disease and other diseases of the circulatory system: Secondary | ICD-10-CM | POA: Insufficient documentation

## 2018-12-29 DIAGNOSIS — Z79899 Other long term (current) drug therapy: Secondary | ICD-10-CM | POA: Insufficient documentation

## 2018-12-29 DIAGNOSIS — Z7901 Long term (current) use of anticoagulants: Secondary | ICD-10-CM | POA: Insufficient documentation

## 2018-12-29 DIAGNOSIS — G4733 Obstructive sleep apnea (adult) (pediatric): Secondary | ICD-10-CM | POA: Insufficient documentation

## 2018-12-29 DIAGNOSIS — Z880 Allergy status to penicillin: Secondary | ICD-10-CM | POA: Insufficient documentation

## 2018-12-29 DIAGNOSIS — Z96651 Presence of right artificial knee joint: Secondary | ICD-10-CM | POA: Insufficient documentation

## 2018-12-29 DIAGNOSIS — E785 Hyperlipidemia, unspecified: Secondary | ICD-10-CM | POA: Insufficient documentation

## 2018-12-29 DIAGNOSIS — X58XXXA Exposure to other specified factors, initial encounter: Secondary | ICD-10-CM | POA: Insufficient documentation

## 2018-12-29 DIAGNOSIS — R079 Chest pain, unspecified: Secondary | ICD-10-CM | POA: Diagnosis present

## 2018-12-29 DIAGNOSIS — Z888 Allergy status to other drugs, medicaments and biological substances status: Secondary | ICD-10-CM | POA: Insufficient documentation

## 2018-12-29 DIAGNOSIS — Z803 Family history of malignant neoplasm of breast: Secondary | ICD-10-CM | POA: Insufficient documentation

## 2018-12-29 DIAGNOSIS — I110 Hypertensive heart disease with heart failure: Secondary | ICD-10-CM | POA: Insufficient documentation

## 2018-12-29 DIAGNOSIS — K219 Gastro-esophageal reflux disease without esophagitis: Secondary | ICD-10-CM | POA: Insufficient documentation

## 2018-12-29 DIAGNOSIS — J45909 Unspecified asthma, uncomplicated: Secondary | ICD-10-CM | POA: Insufficient documentation

## 2018-12-29 DIAGNOSIS — I480 Paroxysmal atrial fibrillation: Secondary | ICD-10-CM | POA: Insufficient documentation

## 2018-12-29 DIAGNOSIS — Z9012 Acquired absence of left breast and nipple: Secondary | ICD-10-CM | POA: Insufficient documentation

## 2018-12-29 DIAGNOSIS — Z7982 Long term (current) use of aspirin: Secondary | ICD-10-CM | POA: Insufficient documentation

## 2018-12-29 LAB — CBC
HCT: 45.3 % (ref 36.0–46.0)
Hemoglobin: 14.2 g/dL (ref 12.0–15.0)
MCH: 31.5 pg (ref 26.0–34.0)
MCHC: 31.3 g/dL (ref 30.0–36.0)
MCV: 100.4 fL — ABNORMAL HIGH (ref 80.0–100.0)
Platelets: 234 10*3/uL (ref 150–400)
RBC: 4.51 MIL/uL (ref 3.87–5.11)
RDW: 13.8 % (ref 11.5–15.5)
WBC: 13.1 10*3/uL — ABNORMAL HIGH (ref 4.0–10.5)
nRBC: 0 % (ref 0.0–0.2)

## 2018-12-29 LAB — BASIC METABOLIC PANEL
Anion gap: 10 (ref 5–15)
BUN: 17 mg/dL (ref 8–23)
CO2: 26 mmol/L (ref 22–32)
Calcium: 9.6 mg/dL (ref 8.9–10.3)
Chloride: 104 mmol/L (ref 98–111)
Creatinine, Ser: 0.86 mg/dL (ref 0.44–1.00)
GFR calc Af Amer: >60 mL/min (ref >60)
GFR calc non Af Amer: >60 mL/min (ref >60)
Glucose, Bld: 119 mg/dL — ABNORMAL HIGH (ref 70–99)
Potassium: 4.9 mmol/L (ref 3.5–5.1)
Sodium: 140 mmol/L (ref 135–145)

## 2018-12-29 LAB — TROPONIN I (HIGH SENSITIVITY)
Troponin I (High Sensitivity): 7 ng/L (ref <18)
Troponin I (High Sensitivity): 7 ng/L (ref <18)

## 2018-12-29 LAB — SARS CORONAVIRUS 2 BY RT PCR (HOSPITAL ORDER, PERFORMED IN ~~LOC~~ HOSPITAL LAB): SARS Coronavirus 2: NEGATIVE

## 2018-12-29 LAB — BRAIN NATRIURETIC PEPTIDE: B Natriuretic Peptide: 78 pg/mL (ref 0.0–100.0)

## 2018-12-29 LAB — PROTIME-INR
INR: 2.3 — ABNORMAL HIGH (ref 0.8–1.2)
Prothrombin Time: 24.9 seconds — ABNORMAL HIGH (ref 11.4–15.2)

## 2018-12-29 LAB — MRSA PCR SCREENING: MRSA by PCR: NEGATIVE

## 2018-12-29 MED ORDER — DILTIAZEM HCL ER COATED BEADS 300 MG PO CP24: 300.0000 mg | ORAL_CAPSULE | Freq: Every day | ORAL | Status: DC

## 2018-12-29 MED ORDER — RISAQUAD PO CAPS
1.0000 | ORAL_CAPSULE | Freq: Every day | ORAL | Status: DC
  Administered 2018-12-30: 1 via ORAL
  Filled 2018-12-29 (×2): qty 1

## 2018-12-29 MED ORDER — MONTELUKAST SODIUM 10 MG PO TABS
10.0000 mg | ORAL_TABLET | Freq: Every day | ORAL | Status: DC
  Administered 2018-12-29: 10 mg via ORAL
  Filled 2018-12-29: qty 1

## 2018-12-29 MED ORDER — WARFARIN - PHARMACIST DOSING INPATIENT
Freq: Every day | Status: DC
  Administered 2018-12-29: 19:00:00

## 2018-12-29 MED ORDER — METHYLPREDNISOLONE SODIUM SUCC 40 MG IJ SOLR
40.0000 mg | Freq: Every day | INTRAMUSCULAR | Status: DC
  Administered 2018-12-29 – 2018-12-30 (×2): 40 mg via INTRAVENOUS
  Filled 2018-12-29 (×2): qty 1

## 2018-12-29 MED ORDER — POLYETHYLENE GLYCOL 3350 17 G PO PACK: 17.0000 g | PACK | Freq: Every day | ORAL | Status: DC | PRN

## 2018-12-29 MED ORDER — METOPROLOL TARTRATE 50 MG PO TABS
50.0000 mg | ORAL_TABLET | Freq: Two times a day (BID) | ORAL | Status: DC
  Administered 2018-12-29 – 2018-12-30 (×2): 50 mg via ORAL
  Filled 2018-12-29 (×2): qty 1

## 2018-12-29 MED ORDER — POLYETHYL GLYCOL-PROPYL GLYCOL 0.4-0.3 % OP GEL
Freq: Every evening | OPHTHALMIC | Status: DC | PRN
  Filled 2018-12-29: qty 10

## 2018-12-29 MED ORDER — IOHEXOL 350 MG/ML SOLN
75.0000 mL | Freq: Once | INTRAVENOUS | Status: AC | PRN
  Administered 2018-12-29: 15:00:00 75 mL via INTRAVENOUS

## 2018-12-29 MED ORDER — POTASSIUM CHLORIDE 20 MEQ PO PACK: 20.0000 meq | PACK | Freq: Two times a day (BID) | ORAL | Status: DC

## 2018-12-29 MED ORDER — ACETAMINOPHEN 500 MG PO TABS: 1000.0000 mg | ORAL_TABLET | Freq: Two times a day (BID) | ORAL | Status: DC | PRN

## 2018-12-29 MED ORDER — ALBUTEROL SULFATE (2.5 MG/3ML) 0.083% IN NEBU: 2.5000 mg | INHALATION_SOLUTION | Freq: Four times a day (QID) | RESPIRATORY_TRACT | Status: DC | PRN

## 2018-12-29 MED ORDER — FUROSEMIDE 10 MG/ML IJ SOLN
40.0000 mg | Freq: Once | INTRAMUSCULAR | Status: AC
  Administered 2018-12-29: 40 mg via INTRAVENOUS
  Filled 2018-12-29: qty 4

## 2018-12-29 MED ORDER — DIPHENHYDRAMINE HCL 25 MG PO CAPS: 25.0000 mg | ORAL_CAPSULE | Freq: Two times a day (BID) | ORAL | Status: DC | PRN

## 2018-12-29 MED ORDER — EZETIMIBE 10 MG PO TABS
10.0000 mg | ORAL_TABLET | Freq: Every day | ORAL | Status: DC
  Administered 2018-12-30: 10 mg via ORAL
  Filled 2018-12-29: qty 1

## 2018-12-29 MED ORDER — METOPROLOL TARTRATE 5 MG/5ML IV SOLN: 5.0000 mg | INTRAVENOUS | Status: DC | PRN

## 2018-12-29 MED ORDER — FLUTICASONE PROPIONATE 50 MCG/ACT NA SUSP
1.0000 | Freq: Every day | NASAL | Status: DC | PRN
  Filled 2018-12-29: qty 16

## 2018-12-29 MED ORDER — ONDANSETRON HCL 4 MG/2ML IJ SOLN
4.0000 mg | Freq: Once | INTRAMUSCULAR | Status: AC
  Administered 2018-12-29: 4 mg via INTRAVENOUS
  Filled 2018-12-29: qty 2

## 2018-12-29 MED ORDER — FUROSEMIDE 40 MG PO TABS
40.0000 mg | ORAL_TABLET | Freq: Every day | ORAL | Status: DC
  Administered 2018-12-30: 40 mg via ORAL
  Filled 2018-12-29: qty 1

## 2018-12-29 MED ORDER — SODIUM CHLORIDE 0.9% FLUSH
3.0000 mL | Freq: Two times a day (BID) | INTRAVENOUS | Status: DC
  Administered 2018-12-29: 23:00:00 3 mL via INTRAVENOUS

## 2018-12-29 MED ORDER — MORPHINE SULFATE (PF) 4 MG/ML IV SOLN
4.0000 mg | Freq: Once | INTRAVENOUS | Status: AC
  Administered 2018-12-29: 4 mg via INTRAVENOUS
  Filled 2018-12-29: qty 1

## 2018-12-29 MED ORDER — WARFARIN SODIUM 4 MG PO TABS
4.0000 mg | ORAL_TABLET | Freq: Once | ORAL | Status: AC
  Administered 2018-12-29: 4 mg via ORAL
  Filled 2018-12-29: qty 1

## 2018-12-29 MED ORDER — MAGNESIUM OXIDE 400 (241.3 MG) MG PO TABS: 800.0000 mg | ORAL_TABLET | Freq: Every day | ORAL | Status: DC

## 2018-12-29 MED ORDER — LORATADINE 10 MG PO TABS
10.0000 mg | ORAL_TABLET | Freq: Every day | ORAL | Status: DC
  Administered 2018-12-30: 10 mg via ORAL
  Filled 2018-12-29: qty 1

## 2018-12-29 MED ORDER — ONDANSETRON HCL 4 MG/2ML IJ SOLN: 4.0000 mg | Freq: Four times a day (QID) | INTRAMUSCULAR | Status: DC | PRN

## 2018-12-29 MED ORDER — VITAMIN B-12 1000 MCG PO TABS
1000.0000 ug | ORAL_TABLET | Freq: Every day | ORAL | Status: DC
  Administered 2018-12-30: 1000 ug via ORAL
  Filled 2018-12-29: qty 1

## 2018-12-29 MED ORDER — TRAMADOL HCL 50 MG PO TABS: 50.0000 mg | ORAL_TABLET | Freq: Four times a day (QID) | ORAL | Status: DC | PRN

## 2018-12-29 MED ORDER — MEGARED OMEGA-3 KRILL OIL 500 MG PO CAPS: 500.0000 mg | ORAL_CAPSULE | ORAL | Status: DC

## 2018-12-29 MED ORDER — TROLAMINE SALICYLATE 10 % EX CREA
1.0000 "application " | TOPICAL_CREAM | Freq: Three times a day (TID) | CUTANEOUS | Status: DC | PRN
  Filled 2018-12-29: qty 85

## 2018-12-29 MED ORDER — DILTIAZEM HCL ER COATED BEADS 300 MG PO CP24
300.0000 mg | ORAL_CAPSULE | Freq: Every day | ORAL | Status: DC
  Administered 2018-12-29 – 2018-12-30 (×2): 300 mg via ORAL
  Filled 2018-12-29 (×3): qty 1

## 2018-12-29 MED ORDER — ONDANSETRON HCL 4 MG PO TABS: 4.0000 mg | ORAL_TABLET | Freq: Four times a day (QID) | ORAL | Status: DC | PRN

## 2018-12-29 MED ORDER — CALCIUM CARBONATE ANTACID 500 MG PO CHEW: 1.0000 | CHEWABLE_TABLET | Freq: Every day | ORAL | Status: DC | PRN

## 2018-12-29 MED ORDER — VITAMIN D 25 MCG (1000 UNIT) PO TABS
2000.0000 [IU] | ORAL_TABLET | Freq: Every day | ORAL | Status: DC
  Administered 2018-12-30: 2000 [IU] via ORAL
  Filled 2018-12-29: qty 2

## 2018-12-29 MED ORDER — ASPIRIN EC 81 MG PO TBEC
81.0000 mg | DELAYED_RELEASE_TABLET | Freq: Every day | ORAL | Status: DC
  Administered 2018-12-30: 81 mg via ORAL
  Filled 2018-12-29: qty 1

## 2018-12-29 NOTE — ED Notes (Signed)
BNP re-collected and sent to lab.
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
## 2018-12-29 NOTE — Progress Notes (Signed)
Patient ID: Laura Gonzalez, female   DOB: 1940-08-19, 78 y.o.   MRN: [id number]  Second troponin negative with no new high-sensitivity troponins ACS unlikely.  No further cardiac work-up.  CT scan of the chest negative for pulmonary embolism.  No pneumonia.  Plan we will give Solu-Medrol 40 mg daily because of reproducible chest pain.  Likely costochondritis.  Dr Loletha Grayer
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
## 2018-12-29 NOTE — ED Triage Notes (Signed)
Pt in via EMS from [facility] with c/o CP and HA that started about 4:30 this am. EMS reports per then pt the pain is mid chest radiating in to her back and pressure like in nature. EMS reports the pt says that it feels like her pneumonia coming on. EMS administered 324 asa en route and gave 0.4 of nitro sublingual en route. 97%RA.

## 2018-12-29 NOTE — ED Notes (Signed)
BNP will be added on to collected lavender top that was previously sent per lab.

## 2018-12-29 NOTE — Progress Notes (Signed)
Talked to Dr. Leslye Peer about patient's HR 120's-140's, in afib with activity and sustaining, BP 145/74, order to give cardizem and metoprolol p.o. also order given for PRN IV metoprolol for HR > 130. No other concern at the moment. RN will continue to monitor.

## 2018-12-29 NOTE — ED Notes (Signed)
Pt is being admitted to [facility]. Report was given to [facility] D.,RN on 2a and she verbalized understanding of the report given .

## 2018-12-29 NOTE — ED Notes (Signed)
Pt to CT with tech.

## 2018-12-29 NOTE — ED Notes (Signed)
Leslye Peer, MD at the bedside

## 2018-12-29 NOTE — ED Notes (Signed)
X-ray at bedside

## 2018-12-30 LAB — BASIC METABOLIC PANEL
Anion gap: 9 (ref 5–15)
BUN: 20 mg/dL (ref 8–23)
CO2: 26 mmol/L (ref 22–32)
Calcium: 9.2 mg/dL (ref 8.9–10.3)
Chloride: 105 mmol/L (ref 98–111)
Creatinine, Ser: 0.83 mg/dL (ref 0.44–1.00)
GFR calc Af Amer: >60 mL/min (ref >60)
GFR calc non Af Amer: >60 mL/min (ref >60)
Glucose, Bld: 175 mg/dL — ABNORMAL HIGH (ref 70–99)
Potassium: 4.3 mmol/L (ref 3.5–5.1)
Sodium: 140 mmol/L (ref 135–145)

## 2018-12-30 LAB — CBC
HCT: 45.3 % (ref 36.0–46.0)
Hemoglobin: 14.3 g/dL (ref 12.0–15.0)
MCH: 31.2 pg (ref 26.0–34.0)
MCHC: 31.6 g/dL (ref 30.0–36.0)
MCV: 98.9 fL (ref 80.0–100.0)
Platelets: 220 10*3/uL (ref 150–400)
RBC: 4.58 MIL/uL (ref 3.87–5.11)
RDW: 13.2 % (ref 11.5–15.5)
WBC: 10.2 10*3/uL (ref 4.0–10.5)
nRBC: 0 % (ref 0.0–0.2)

## 2018-12-30 LAB — PROTIME-INR
INR: 2.4 — ABNORMAL HIGH (ref 0.8–1.2)
Prothrombin Time: 25.4 seconds — ABNORMAL HIGH (ref 11.4–15.2)

## 2018-12-30 MED ORDER — WARFARIN SODIUM 4 MG PO TABS
4.0000 mg | ORAL_TABLET | Freq: Once | ORAL | Status: DC
  Filled 2018-12-30: qty 1
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
## 2018-12-30 NOTE — Progress Notes (Signed)
PT Cancellation Note  Patient Details Name: Laura Gonzalez MRN: [id number] DOB: 1941/06/04   Cancelled Treatment:    Reason Eval/Treat Not Completed: Other (comment);Patient declined, no reason specified(Patient consult received and reviewed. Upon evaluation attempt patient declines all physical therapy stating she is returning home this afternoon and does not need to participate in PT. Patient requests PT to sign off; as she is at her baseline.) PT will sign off on this patient as requested. Will be happy to see this patient again as deemed necessary.   Janna Arch, PT, DPT   12/30/2018, 11:58 AM

## 2018-12-30 NOTE — Discharge Instructions (Signed)
Thank you for allowing [place] to participate in your care!    You were admitted for chest pain. We ruled out the pain coming from your heart. We also did a CT scan of your chest that ruled out a blood clot in your lungs. We think it is likely due to a strained muscle between your ribs. Please see below for management options. We did not make any changes to your medications.   If you experience worsening of your admission symptoms, develop shortness of breath, life threatening emergency, suicidal or homicidal thoughts you must seek medical attention immediately by calling 911 or calling your MD immediately  if symptoms less severe.  Chest Wall Pain Chest wall pain is pain in or around the bones and muscles of your chest. Chest wall pain may be caused by:  An injury.  Coughing a lot.  Using your chest and arm muscles too much. Sometimes, the cause may not be known. This pain may take a few weeks or longer to get better. Follow these instructions at home: Managing pain, stiffness, and swelling If told, put ice on the painful area:  Put ice in a plastic bag.  Place a towel between your skin and the bag.  Leave the ice on for 20 minutes, 2-3 times a day.  Activity  Rest as told by your doctor.  Avoid doing things that cause pain. This includes lifting heavy items.  Ask your doctor what activities are safe for you. General instructions   Take over-the-counter and prescription medicines only as told by your doctor.  Do not use any products that contain nicotine or tobacco, such as cigarettes, e-cigarettes, and chewing tobacco. If you need help quitting, ask your doctor.  Keep all follow-up visits as told by your doctor. This is important. Contact a doctor if:  You have a fever.  Your chest pain gets worse.  You have new symptoms. Get help right away if:  You feel sick to your stomach (nauseous) or you throw up (vomit).  You feel sweaty or light-headed.  You have a cough with  mucus from your lungs (sputum) or you cough up blood.  You are short of breath. These symptoms may be an emergency. Do not wait to see if the symptoms will go away. Get medical help right away. Call your local emergency services (911 in the [place] Do not drive yourself to the hospital. Summary  Chest wall pain is pain in or around the bones and muscles of your chest.  It may be treated with ice, rest, and medicines. Your condition may also get better if you avoid doing things that cause pain.  Contact a doctor if you have a fever, chest pain that gets worse, or new symptoms.  Get help right away if you feel light-headed or you get short of breath. These symptoms may be an emergency. This information is not intended to replace advice given to you by your health care provider. Make sure you discuss any questions you have with your health care provider. Document Released: 11/25/2007 Document Revised: 12/09/2017 Document Reviewed: 12/09/2017 Elsevier Patient Education  2020 [employer].

## 2018-12-30 NOTE — Consult Note (Signed)
[place] Nurse wound consult note Patient receiving care in [facility] 248 Reason for Consult: LLE wounds Wound type: trauma from scratching Wound beds: the wound to the lower leg pretibial area is pink, no odor, no drainage.  The wound located above the lateral ankle is very small, pink, no drainage, no odor. Dressing procedure/placement/frequency: Place small foam pads over the areas on the LLE.  Change every 2 days and prn. This was done today. Next schedule dressing change is 01/01/19. Monitor the wound area(s) for worsening of condition such as: Signs/symptoms of infection,  Increase in size,  Development of or worsening of odor, Development of pain, or increased pain at the affected locations.  Notify the medical team if any of these develop.  Thank you for the consult.  Discussed plan of care with the patient.  [place] nurse will not follow at this time.  Please re-consult the [place] team if needed.  Val Riles, RN, MSN, CWOCN, CNS-BC, pager [phone]
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
## 2018-12-30 NOTE — Progress Notes (Signed)
PT Cancellation Note  Patient Details Name: Laura Gonzalez MRN: [id number] DOB: [birth year]   Cancelled Treatment:    Reason Eval/Treat Not Completed: Other (comment);Patient declined, no reason specified(Patient consult recieved and reviewed. Upon entering patient's room patient is eating breakfast and requests PT return after she finishes eating. Will attempt again at later time/date.)  Janna Arch, PT, DPT   12/30/2018, 8:29 AM

## 2018-12-30 NOTE — Plan of Care (Signed)
Pt ready for discharge home.  Transported by POV.   Problem: Education: Goal: Knowledge of General Education information will improve Description: Including pain rating scale, medication(s)/side effects and non-pharmacologic comfort measures Outcome: Completed/Met   Problem: Health Behavior/Discharge Planning: Goal: Ability to manage health-related needs will improve Outcome: Completed/Met   Problem: Clinical Measurements: Goal: Ability to maintain clinical measurements within normal limits will improve Outcome: Completed/Met Goal: Will remain free from infection Outcome: Completed/Met Goal: Diagnostic test results will improve Outcome: Completed/Met Goal: Respiratory complications will improve Outcome: Completed/Met Goal: Cardiovascular complication will be avoided Outcome: Completed/Met   Problem: Activity: Goal: Risk for activity intolerance will decrease Outcome: Completed/Met   Problem: Nutrition: Goal: Adequate nutrition will be maintained Outcome: Completed/Met   Problem: Coping: Goal: Level of anxiety will decrease Outcome: Completed/Met   Problem: Elimination: Goal: Will not experience complications related to bowel motility Outcome: Completed/Met Goal: Will not experience complications related to urinary retention Outcome: Completed/Met   Problem: Pain Managment: Goal: General experience of comfort will improve Outcome: Completed/Met   Problem: Safety: Goal: Ability to remain free from injury will improve Outcome: Completed/Met   Problem: Skin Integrity: Goal: Risk for impaired skin integrity will decrease Outcome: Completed/Met

## 2019-01-01 ENCOUNTER — Other Ambulatory Visit: Payer: Self-pay

## 2019-01-01 ENCOUNTER — Inpatient Hospital Stay
Admission: EM | Admit: 2019-01-01 | Discharge: 2019-01-04 | DRG: 309 | Disposition: A | Discharge status: Home Health | Payer: Medicare Other | Attending: Internal Medicine | Admitting: Internal Medicine

## 2019-01-01 ENCOUNTER — Emergency Department: Payer: Medicare Other

## 2019-01-01 ENCOUNTER — Encounter: Payer: Self-pay | Admitting: Emergency Medicine

## 2019-01-01 DIAGNOSIS — R079 Chest pain, unspecified: Secondary | ICD-10-CM

## 2019-01-01 DIAGNOSIS — Z7982 Long term (current) use of aspirin: Secondary | ICD-10-CM

## 2019-01-01 DIAGNOSIS — Z1159 Encounter for screening for other viral diseases: Secondary | ICD-10-CM | POA: Diagnosis not present

## 2019-01-01 DIAGNOSIS — I4819 Other persistent atrial fibrillation: Principal | ICD-10-CM | POA: Diagnosis present

## 2019-01-01 DIAGNOSIS — G4700 Insomnia, unspecified: Secondary | ICD-10-CM | POA: Diagnosis present

## 2019-01-01 DIAGNOSIS — J45909 Unspecified asthma, uncomplicated: Secondary | ICD-10-CM | POA: Diagnosis present

## 2019-01-01 DIAGNOSIS — I5032 Chronic diastolic (congestive) heart failure: Secondary | ICD-10-CM | POA: Diagnosis present

## 2019-01-01 DIAGNOSIS — G4733 Obstructive sleep apnea (adult) (pediatric): Secondary | ICD-10-CM | POA: Diagnosis present

## 2019-01-01 DIAGNOSIS — I110 Hypertensive heart disease with heart failure: Secondary | ICD-10-CM | POA: Diagnosis present

## 2019-01-01 DIAGNOSIS — Z7901 Long term (current) use of anticoagulants: Secondary | ICD-10-CM

## 2019-01-01 DIAGNOSIS — Z7951 Long term (current) use of inhaled steroids: Secondary | ICD-10-CM

## 2019-01-01 DIAGNOSIS — Z6841 Body Mass Index (BMI) 40.0 and over, adult: Secondary | ICD-10-CM | POA: Diagnosis not present

## 2019-01-01 DIAGNOSIS — E785 Hyperlipidemia, unspecified: Secondary | ICD-10-CM | POA: Diagnosis present

## 2019-01-01 DIAGNOSIS — Z85828 Personal history of other malignant neoplasm of skin: Secondary | ICD-10-CM

## 2019-01-01 DIAGNOSIS — Z96651 Presence of right artificial knee joint: Secondary | ICD-10-CM | POA: Diagnosis present

## 2019-01-01 DIAGNOSIS — K219 Gastro-esophageal reflux disease without esophagitis: Secondary | ICD-10-CM | POA: Diagnosis present

## 2019-01-01 DIAGNOSIS — M810 Age-related osteoporosis without current pathological fracture: Secondary | ICD-10-CM | POA: Diagnosis present

## 2019-01-01 DIAGNOSIS — Z79899 Other long term (current) drug therapy: Secondary | ICD-10-CM

## 2019-01-01 DIAGNOSIS — I4891 Unspecified atrial fibrillation: Secondary | ICD-10-CM | POA: Diagnosis present

## 2019-01-01 LAB — BASIC METABOLIC PANEL
Anion gap: 9 (ref 5–15)
BUN: 25 mg/dL — ABNORMAL HIGH (ref 8–23)
CO2: 29 mmol/L (ref 22–32)
Calcium: 9.1 mg/dL (ref 8.9–10.3)
Chloride: 103 mmol/L (ref 98–111)
Creatinine, Ser: 0.85 mg/dL (ref 0.44–1.00)
GFR calc Af Amer: >60 mL/min (ref >60)
GFR calc non Af Amer: >60 mL/min (ref >60)
Glucose, Bld: 109 mg/dL — ABNORMAL HIGH (ref 70–99)
Potassium: 4.2 mmol/L (ref 3.5–5.1)
Sodium: 141 mmol/L (ref 135–145)

## 2019-01-01 LAB — TROPONIN I (HIGH SENSITIVITY)
Troponin I (High Sensitivity): 6 ng/L (ref <18)
Troponin I (High Sensitivity): 6 ng/L (ref <18)
Troponin I (High Sensitivity): 7 ng/L (ref <18)

## 2019-01-01 LAB — CBC
HCT: 48.5 % — ABNORMAL HIGH (ref 36.0–46.0)
Hemoglobin: 15.4 g/dL — ABNORMAL HIGH (ref 12.0–15.0)
MCH: 31.3 pg (ref 26.0–34.0)
MCHC: 31.8 g/dL (ref 30.0–36.0)
MCV: 98.6 fL (ref 80.0–100.0)
Platelets: 253 10*3/uL (ref 150–400)
RBC: 4.92 MIL/uL (ref 3.87–5.11)
RDW: 13.3 % (ref 11.5–15.5)
WBC: 10.7 10*3/uL — ABNORMAL HIGH (ref 4.0–10.5)
nRBC: 0 % (ref 0.0–0.2)

## 2019-01-01 LAB — PROTIME-INR
INR: 3.4 — ABNORMAL HIGH (ref 0.8–1.2)
Prothrombin Time: 33.9 seconds — ABNORMAL HIGH (ref 11.4–15.2)

## 2019-01-01 LAB — SARS CORONAVIRUS 2 BY RT PCR (HOSPITAL ORDER, PERFORMED IN ~~LOC~~ HOSPITAL LAB): SARS Coronavirus 2: NEGATIVE

## 2019-01-01 MED ORDER — FUROSEMIDE 40 MG PO TABS
40.0000 mg | ORAL_TABLET | Freq: Every day | ORAL | Status: DC | PRN
  Administered 2019-01-02: 10:00:00 40 mg via ORAL
  Filled 2019-01-01: qty 1

## 2019-01-01 MED ORDER — MAGNESIUM OXIDE 400 (241.3 MG) MG PO TABS
800.0000 mg | ORAL_TABLET | Freq: Every day | ORAL | Status: DC
  Administered 2019-01-02 – 2019-01-04 (×3): 800 mg via ORAL
  Filled 2019-01-01 (×3): qty 2

## 2019-01-01 MED ORDER — EZETIMIBE 10 MG PO TABS
10.0000 mg | ORAL_TABLET | Freq: Every day | ORAL | Status: DC
  Administered 2019-01-02 – 2019-01-04 (×3): 10 mg via ORAL
  Filled 2019-01-01 (×3): qty 1

## 2019-01-01 MED ORDER — RISAQUAD PO CAPS
1.0000 | ORAL_CAPSULE | Freq: Every day | ORAL | Status: DC
  Administered 2019-01-02 – 2019-01-04 (×3): 1 via ORAL
  Filled 2019-01-01 (×3): qty 1

## 2019-01-01 MED ORDER — KETOROLAC TROMETHAMINE 30 MG/ML IJ SOLN
15.0000 mg | Freq: Once | INTRAMUSCULAR | Status: AC
  Administered 2019-01-01: 15 mg via INTRAVENOUS
  Filled 2019-01-01: qty 1

## 2019-01-01 MED ORDER — DILTIAZEM HCL 100 MG IV SOLR
5.0000 mg/h | INTRAVENOUS | Status: DC
  Administered 2019-01-01: 15:00:00 5 mg/h via INTRAVENOUS
  Administered 2019-01-01: 15 mg/h via INTRAVENOUS
  Filled 2019-01-01 (×2): qty 100

## 2019-01-01 MED ORDER — TRAMADOL HCL 50 MG PO TABS: 50.0000 mg | ORAL_TABLET | Freq: Four times a day (QID) | ORAL | Status: DC | PRN

## 2019-01-01 MED ORDER — VITAMIN B-12 1000 MCG PO TABS
1000.0000 ug | ORAL_TABLET | Freq: Every day | ORAL | Status: DC
  Administered 2019-01-02 – 2019-01-04 (×3): 1000 ug via ORAL
  Filled 2019-01-01 (×3): qty 1

## 2019-01-01 MED ORDER — POLYETHYLENE GLYCOL 3350 17 G PO PACK: 17.0000 g | PACK | Freq: Every day | ORAL | Status: DC | PRN

## 2019-01-01 MED ORDER — ONDANSETRON HCL 4 MG PO TABS: 4.0000 mg | ORAL_TABLET | Freq: Four times a day (QID) | ORAL | Status: DC | PRN

## 2019-01-01 MED ORDER — ACETAMINOPHEN 650 MG RE SUPP: 650.0000 mg | Freq: Four times a day (QID) | RECTAL | Status: DC | PRN

## 2019-01-01 MED ORDER — ASPIRIN EC 81 MG PO TBEC: 81.0000 mg | DELAYED_RELEASE_TABLET | Freq: Every day | ORAL | Status: DC | PRN

## 2019-01-01 MED ORDER — DILTIAZEM HCL ER COATED BEADS 180 MG PO CP24
300.0000 mg | ORAL_CAPSULE | Freq: Every day | ORAL | Status: DC
  Administered 2019-01-02 – 2019-01-04 (×3): 300 mg via ORAL
  Filled 2019-01-01 (×3): qty 1

## 2019-01-01 MED ORDER — ACETAMINOPHEN 325 MG PO TABS: 650.0000 mg | ORAL_TABLET | Freq: Four times a day (QID) | ORAL | Status: DC | PRN

## 2019-01-01 MED ORDER — DILTIAZEM LOAD VIA INFUSION
10.0000 mg | Freq: Once | INTRAVENOUS | Status: AC
  Administered 2019-01-01: 15:00:00 10 mg via INTRAVENOUS
  Filled 2019-01-01: qty 10

## 2019-01-01 MED ORDER — METOPROLOL TARTRATE 50 MG PO TABS
50.0000 mg | ORAL_TABLET | Freq: Two times a day (BID) | ORAL | Status: DC
  Administered 2019-01-01 – 2019-01-04 (×6): 50 mg via ORAL
  Filled 2019-01-01 (×6): qty 1

## 2019-01-01 MED ORDER — ADULT MULTIVITAMIN W/MINERALS CH
1.0000 | ORAL_TABLET | Freq: Every day | ORAL | Status: DC
  Administered 2019-01-01 – 2019-01-04 (×4): 1 via ORAL
  Filled 2019-01-01 (×4): qty 1

## 2019-01-01 MED ORDER — METHYLPREDNISOLONE SODIUM SUCC 125 MG IJ SOLR
125.0000 mg | Freq: Once | INTRAMUSCULAR | Status: AC
  Administered 2019-01-01: 125 mg via INTRAVENOUS
  Filled 2019-01-01: qty 2

## 2019-01-01 MED ORDER — ONDANSETRON HCL 4 MG/2ML IJ SOLN: 4.0000 mg | Freq: Four times a day (QID) | INTRAMUSCULAR | Status: DC | PRN

## 2019-01-01 MED ORDER — VITAMIN D 25 MCG (1000 UNIT) PO TABS
2000.0000 [IU] | ORAL_TABLET | Freq: Every day | ORAL | Status: DC
  Administered 2019-01-02 – 2019-01-04 (×3): 2000 [IU] via ORAL
  Filled 2019-01-01 (×3): qty 2

## 2019-01-01 MED ORDER — ALBUTEROL SULFATE HFA 108 (90 BASE) MCG/ACT IN AERS: 2.0000 | INHALATION_SPRAY | Freq: Four times a day (QID) | RESPIRATORY_TRACT | Status: DC | PRN

## 2019-01-01 MED ORDER — FLUTICASONE PROPIONATE 50 MCG/ACT NA SUSP
1.0000 | Freq: Every day | NASAL | Status: DC | PRN
  Filled 2019-01-01: qty 16

## 2019-01-01 MED ORDER — ALBUTEROL SULFATE (2.5 MG/3ML) 0.083% IN NEBU: 2.5000 mg | INHALATION_SOLUTION | Freq: Four times a day (QID) | RESPIRATORY_TRACT | Status: DC | PRN

## 2019-01-01 MED ORDER — MONTELUKAST SODIUM 10 MG PO TABS
10.0000 mg | ORAL_TABLET | Freq: Every day | ORAL | Status: DC
  Administered 2019-01-01 – 2019-01-03 (×3): 10 mg via ORAL
  Filled 2019-01-01 (×3): qty 1

## 2019-01-01 MED ORDER — POTASSIUM CHLORIDE 20 MEQ PO PACK
20.0000 meq | PACK | Freq: Two times a day (BID) | ORAL | Status: DC
  Administered 2019-01-01 – 2019-01-04 (×6): 20 meq via ORAL
  Filled 2019-01-01 (×6): qty 1

## 2019-01-01 MED ORDER — LORATADINE 10 MG PO TABS
10.0000 mg | ORAL_TABLET | Freq: Every day | ORAL | Status: DC
  Administered 2019-01-02 – 2019-01-04 (×3): 10 mg via ORAL
  Filled 2019-01-01 (×3): qty 1

## 2019-01-01 MED ORDER — CALCIUM CARBONATE ANTACID 500 MG PO CHEW: 1.0000 | CHEWABLE_TABLET | Freq: Every day | ORAL | Status: DC | PRN

## 2019-01-01 MED ORDER — MEGARED OMEGA-3 KRILL OIL 500 MG PO CAPS: 500.0000 mg | ORAL_CAPSULE | ORAL | Status: DC
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
## 2019-01-01 NOTE — ED Triage Notes (Signed)
Pt arrived via EMS from [facility] with Chest Pain. Pt received aspirin and NTG with EMS.   Pt states the pain started when she was sitting down to read, states the pain started in her left breast and was told to put ice on it. Pt states she iced the area, but the pain worsened radiating down left arm and up to left side of face.  Pt continues to c/o chest pain, 6/10.

## 2019-01-01 NOTE — ED Notes (Signed)
Patient transported to X-ray 
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
## 2019-01-01 NOTE — ED Triage Notes (Signed)
Pt to ED via [facility] from [facility] for chest pain. Pt was seen on Thursday for same. Per EMS they gave patient 324 mg os Aspirin and 1 sublingual nitro in route. Pt has 20 G IV in her left AC. Pt is in NAD at this time. Able to move from EMS stretcher to wheelchair without difficulty.

## 2019-01-01 NOTE — Progress Notes (Signed)
Previous wound care note from 12/30/18  [place] Nurse wound consult note Patient receiving care in [facility] [address] Reason for Consult: LLE wounds Wound type: trauma from scratching Wound beds: the wound to the lower leg pretibial area is pink, no odor, no drainage.  The wound located above the lateral ankle is very small, pink, no drainage, no odor. Dressing procedure/placement/frequency: Place small foam pads over the areas on the LLE.  Change every 2 days and prn. This was done today. Next schedule dressing change is 01/01/19. Monitor the wound area(s) for worsening of condition such as: Signs/symptoms of infection,  Increase in size,  Development of or worsening of odor, Development of pain, or increased pain at the affected locations.  Notify the medical team if any of these develop.  Thank you for the consult.  Discussed plan of care with the patient.  [place] nurse will not follow at this time.  Please re-consult the [place] team if needed.  Val Riles, RN, MSN, CWOCN, CNS-BC, pager [phone]

## 2019-01-01 NOTE — ED Notes (Signed)
Report given to Ashley, RN

## 2019-01-01 NOTE — ED Notes (Signed)
Pt HR jumping from 108 to 150. Informed MD and MD at bedside at this time.
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
## 2019-01-01 NOTE — Progress Notes (Signed)
PHARMACIST - PHYSICIAN ORDER COMMUNICATION  CONCERNING: P&T Medication Policy on Herbal Medications  DESCRIPTION:  This patient's order for:  MegaRed Omega-3 Krill Oil 500mg  Capsules  has been noted.  This product(s) is classified as an "herbal" or natural product. Due to a lack of definitive safety studies or FDA approval, nonstandard manufacturing practices, plus the potential risk of unknown drug-drug interactions while on inpatient medications, the Pharmacy and Therapeutics Committee does not permit the use of "herbal" or natural products of this type within [facility].   ACTION TAKEN: The pharmacy department is unable to verify this order at this time and your patient has been informed of this safety policy. Please reevaluate patient's clinical condition at discharge and address if the herbal or natural product(s) should be resumed at that time.

## 2019-01-01 NOTE — ED Notes (Signed)
Admitting Dr at bedside

## 2019-01-01 NOTE — ED Notes (Signed)
EDP at bedside  

## 2019-01-01 NOTE — ED Notes (Signed)
Pt assisted to bathroom
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
## 2019-01-01 NOTE — ED Notes (Signed)
Pt states she has chest pain and was seen here for the same Thursday and was told that it was her "rib muscles"- chest pain subsided Friday morning and got progressively worse today

## 2019-01-02 ENCOUNTER — Inpatient Hospital Stay
Admit: 2019-01-02 | Discharge: 2019-01-02 | Disposition: A | Discharge status: Home / Self Care | Payer: Medicare Other | Attending: Specialist | Admitting: Specialist

## 2019-01-02 LAB — BASIC METABOLIC PANEL
Anion gap: 9 (ref 5–15)
BUN: 26 mg/dL — ABNORMAL HIGH (ref 8–23)
CO2: 24 mmol/L (ref 22–32)
Calcium: 8.9 mg/dL (ref 8.9–10.3)
Chloride: 107 mmol/L (ref 98–111)
Creatinine, Ser: 0.76 mg/dL (ref 0.44–1.00)
GFR calc Af Amer: >60 mL/min (ref >60)
GFR calc non Af Amer: >60 mL/min (ref >60)
Glucose, Bld: 165 mg/dL — ABNORMAL HIGH (ref 70–99)
Potassium: 4.2 mmol/L (ref 3.5–5.1)
Sodium: 140 mmol/L (ref 135–145)

## 2019-01-02 LAB — CBC
HCT: 44.8 % (ref 36.0–46.0)
Hemoglobin: 14.3 g/dL (ref 12.0–15.0)
MCH: 30.8 pg (ref 26.0–34.0)
MCHC: 31.9 g/dL (ref 30.0–36.0)
MCV: 96.6 fL (ref 80.0–100.0)
Platelets: 253 10*3/uL (ref 150–400)
RBC: 4.64 MIL/uL (ref 3.87–5.11)
RDW: 13.2 % (ref 11.5–15.5)
WBC: 10.9 10*3/uL — ABNORMAL HIGH (ref 4.0–10.5)
nRBC: 0 % (ref 0.0–0.2)

## 2019-01-02 LAB — ECHOCARDIOGRAM COMPLETE
Height: 65.5 in
Weight: 4784 oz

## 2019-01-02 LAB — PROTIME-INR
INR: 2.6 — ABNORMAL HIGH (ref 0.8–1.2)
Prothrombin Time: 27.3 seconds — ABNORMAL HIGH (ref 11.4–15.2)

## 2019-01-02 MED ORDER — WARFARIN - PHARMACIST DOSING INPATIENT: Freq: Every day | Status: DC

## 2019-01-02 MED ORDER — SODIUM CHLORIDE 0.9% FLUSH
3.0000 mL | Freq: Two times a day (BID) | INTRAVENOUS | Status: DC
  Administered 2019-01-02 – 2019-01-04 (×4): 3 mL via INTRAVENOUS

## 2019-01-02 MED ORDER — WARFARIN SODIUM 2 MG PO TABS
2.0000 mg | ORAL_TABLET | Freq: Once | ORAL | Status: AC
  Administered 2019-01-02: 19:00:00 2 mg via ORAL
  Filled 2019-01-02: qty 1

## 2019-01-02 MED ORDER — DILTIAZEM HCL 30 MG PO TABS
30.0000 mg | ORAL_TABLET | Freq: Once | ORAL | Status: AC
  Administered 2019-01-02: 30 mg via ORAL
  Filled 2019-01-02: qty 1

## 2019-01-02 MED ORDER — DILTIAZEM HCL 100 MG IV SOLR
5.0000 mg/h | INTRAVENOUS | Status: DC
  Administered 2019-01-02 (×2): 10 mg/h via INTRAVENOUS
  Filled 2019-01-02: qty 100

## 2019-01-02 NOTE — TOC Transition Note (Addendum)
Transition of Care [facility]) - CM/SW Discharge Note   Patient Details  Name: RAVONDA BRECHEEN MRN: [id number] Date of Birth: January 10, 1941  [facility] CM/SW Contact:  Ross Ludwig, LCSW Phone Number: 12/30/18, 1600  Clinical Narrative:     Patient is a 78 year old female who is from [place] at the [place].  Patient states she does not have any concerns, patient expressed she is ready to go back home.  CSW informed her that the physician would like her to have a home health nurse, CSW provided list of choices.  Patient stated it did not matter whoever will take my insurance.  CSW provided referral to [facility] home health agency.  Patient was appreciative of information given.   Final next level of care: [place] Barriers to Discharge: No Barriers Identified   Patient Goals and CMS Choice Patient states their goals for this hospitalization and ongoing recovery are:: To return back home with home health CMS Medicare.gov Compare Post Acute Care list provided to:: Patient Choice offered to / list presented to : Patient  Discharge Placement  Discharge back to home.                     Discharge Plan and Services                DME Arranged: N/A DME Agency: NA       HH Arranged: RN [place] Agency: [facility] Date [place] Agency Contacted: 12/30/18 Time [place]: 1600 Representative spoke with at [place]: [place]  Social Determinants of Health ([place]) Interventions     Readmission Risk Interventions No flowsheet data found.

## 2019-01-02 NOTE — Plan of Care (Signed)
  Problem: Education: Goal: Knowledge of General Education information will improve Description: Including pain rating scale, medication(s)/side effects and non-pharmacologic comfort measures Outcome: Progressing   Problem: Clinical Measurements: Goal: Will remain free from infection Outcome: Progressing Goal: Respiratory complications will improve Outcome: Progressing   Problem: Activity: Goal: Risk for activity intolerance will decrease Outcome: Progressing   

## 2019-01-02 NOTE — Progress Notes (Signed)
[facility] NAME: Laura Gonzalez    MR#:  [id number]  DATE OF BIRTH:  12-01-1940  SUBJECTIVE:  CHIEF COMPLAINT:  Pt is resting comfortably denies any chest pain or palpitations or shortness of breath..  Out of bed to chair.  Off Cardizem drip  REVIEW OF SYSTEMS:  CONSTITUTIONAL: No fever, fatigue or weakness.  EYES: No blurred or double vision.  EARS, NOSE, AND THROAT: No tinnitus or ear pain.  RESPIRATORY: No cough, shortness of breath, wheezing or hemoptysis.  CARDIOVASCULAR: No chest pain, orthopnea, edema.  GASTROINTESTINAL: No nausea, vomiting, diarrhea or abdominal pain.  GENITOURINARY: No dysuria, hematuria.  ENDOCRINE: No polyuria, nocturia,  HEMATOLOGY: No anemia, easy bruising or bleeding SKIN: No rash or lesion. MUSCULOSKELETAL: No joint pain or arthritis.   NEUROLOGIC: No tingling, numbness, weakness.  PSYCHIATRY: No anxiety or depression.   DRUG ALLERGIES:   Allergies  Allergen Reactions  . Amoxicillin Other (See Comments)    Lips swelling, tingling Has patient had a PCN reaction causing immediate rash, facial/tongue/throat swelling, SOB or lightheadedness with hypotension: Yes Has patient had a PCN reaction causing severe rash involving mucus membranes or skin necrosis: No Has patient had a PCN reaction that required hospitalization: No Has patient had a PCN reaction occurring within the last 10 years: No If all of the above answers are "NO", then may proceed with Cephalosporin use.   . Benzocaine-Menthol     Throat swelling  . Chloraseptic Sore Throat [Acetaminophen] Other (See Comments)    throat swelling  . Biafine [Wound Dressings] Swelling  . Chlorpheniramine     Throat swelling  . Neosporin [Neomycin-Bacitracin Zn-Polymyx] Other (See Comments)    Skin redness, puffy  . Shellfish Allergy Swelling    "lips swell"  . Statins Other (See Comments)    Muscle weakness, weak  . Tape Other (See  Comments)    Skin redness    VITALS:  Blood pressure 135/75, pulse 73, temperature (!) 97.4 F (36.3 C), temperature source Oral, resp. rate 18, height 5' 5.5" (1.664 m), weight 135.6 kg, SpO2 96 %.  PHYSICAL EXAMINATION:  GENERAL:  78 y.o.-year-old patient lying in the bed with no acute distress.  EYES: Pupils equal, round, reactive to light and accommodation. No scleral icterus. Extraocular muscles intact.  HEENT: Head atraumatic, normocephalic. Oropharynx and nasopharynx clear.  NECK:  Supple, no jugular venous distention. No thyroid enlargement, no tenderness.  LUNGS: Normal breath sounds bilaterally, no wheezing, rales,rhonchi or crepitation. No use of accessory muscles of respiration.  CARDIOVASCULAR: S1, S2 normal. No murmurs, rubs, or gallops.  ABDOMEN: Soft, nontender, nondistended. Bowel sounds present. No organomegaly or mass.  EXTREMITIES: No pedal edema, cyanosis, or clubbing.  NEUROLOGIC: Cranial nerves II through XII are intact. Muscle strength at her baseline in all extremities. Sensation intact. Gait not checked.  PSYCHIATRIC: The patient is alert and oriented x 3.  SKIN: No obvious rash, lesion, or ulcer.    LABORATORY PANEL:   CBC Recent Labs  Lab 01/02/19 0450  WBC 10.9*  HGB 14.3  HCT 44.8  PLT 253   ------------------------------------------------------------------------------------------------------------------  Chemistries  Recent Labs  Lab 01/02/19 0450  NA 140  K 4.2  CL 107  CO2 24  GLUCOSE 165*  BUN 26*  CREATININE 0.76  CALCIUM 8.9   ------------------------------------------------------------------------------------------------------------------  Cardiac Enzymes No results for input(s): TROPONINI in the last 168 hours. ------------------------------------------------------------------------------------------------------------------  RADIOLOGY:  Dg Chest 2 View  Result Date: 01/01/2019 CLINICAL DATA:  Chest pain EXAM: CHEST - 2  VIEW COMPARISON:  December 29, 2018 FINDINGS: There is atelectatic change in the left base. There is no edema or consolidation. There is central peribronchial thickening. Heart is upper normal in size with pulmonary vascularity within normal limits. No adenopathy. There is postoperative change in the lower cervical region. There is degenerative change in the thoracic spine. No pneumothorax. IMPRESSION: Left base atelectasis. No edema or consolidation. There is central peribronchial thickening, likely indicative of a degree of underlying bronchitis. Heart upper normal in size. Electronically Signed   By: Lowella Grip III M.D.   On: 01/01/2019 14:03    EKG:   Orders placed or performed during the hospital encounter of 01/01/19  . EKG 12-Lead  . EKG 12-Lead  . ED EKG  . ED EKG    ASSESSMENT AND PLAN:    78 y.o. female with a known history of morbid obesity, atrial fibrillation, osteoarthritis, GERD, obstructive sleep apnea, hypertension, recent admission for chest pain who presents to the hospital complaining of chest pain again and noted to be in atrial fibrillation with rapid ventricular response.  1.  Atrial fibrillation with rapid ventricular response-  Patient is clinically improving.  Off Cardizem drip  -We will continue her oral metoprolol and Cardizem -Patient is on Coumadin will consult pharmacy for dosing.  INR is therapeutic presently. -Echocardiogram done results pending [facility] cardiology is planning to do stress test tomorrow.  We will make her n.p.o. after midnight  2.  History of chronic diastolic CHF-clinically patient is on congestive heart failure. -Continue Lasix, metoprolol.  3.  Asthma-no acute exacerbation.  Continue albuterol nebulizer and Flonase and inhalers. -Continue Claritin.  4.  Hyperlipidemia-continue Zetia.  5.  Osteoporosis-continue vitamin D supplements.    All the records are reviewed and case discussed with Care Management/Social  Workerr. Management plans discussed with the patient, family and they are in agreement.  CODE STATUS: fc   TOTAL TIME TAKING CARE OF THIS PATIENT: 35 minutes.   POSSIBLE D/C IN 1-2  DAYS, DEPENDING ON CLINICAL CONDITION.  Note: This dictation was prepared with Dragon dictation along with smaller phrase technology. Any transcriptional errors that result from this process are unintentional.   [name] M.D on 01/02/2019 at 12:34 PM  Between 7am to 6pm - Pager - [phone] After 6pm go to [email or web address] - password [email or web address] [facility]  Office  [phone]  CC: Primary care physician; Baxter Hire, MD

## 2019-01-02 NOTE — Progress Notes (Signed)
Family Meeting Note  Advance Directive:yes  Today a meeting took place with the Patient.    The following clinical team members were present during this meeting:MD  The following were discussed:Patient's diagnosis: Atrial fibrillation with RVR on Coumadin, INR therapeutic, chronic diastolic CHF, asthma hyperlipidemia and treatment plan of care discussed in detail with the patient   Patient's progosis: Unable to determine and Goals for treatment: Full Code Laura Gonzalez is HCPOA Additional follow-up to be provided: Hospitalist and cardiology  Time spent during discussion:17MIN  [name], MD

## 2019-01-02 NOTE — Progress Notes (Signed)
MD notified for change in patient heart rate and rhythm appeared to jump back into afib with rate in the upper 130's-140, order for 30 mg cardizem given, EKG placed on chart.
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
## 2019-01-02 NOTE — Plan of Care (Signed)
  Problem: Clinical Measurements: Goal: Ability to maintain clinical measurements within normal limits will improve Outcome: Progressing Goal: Cardiovascular complication will be avoided Outcome: Progressing  Patient able to maintain heart rate/ rhythm for majority of shift, MD notified of exception, Stress test tomorrow,  echo still pending at this time.

## 2019-01-02 NOTE — Plan of Care (Signed)
  Problem: Education: Goal: Knowledge of General Education information will improve Description: Including pain rating scale, medication(s)/side effects and non-pharmacologic comfort measures Outcome: Progressing   Problem: Activity: Goal: Risk for activity intolerance will decrease Outcome: Progressing   Problem: Safety: Goal: Ability to remain free from injury will improve Outcome: Progressing   Problem: Cardiac: Goal: Ability to achieve and maintain adequate cardiopulmonary perfusion will improve Outcome: Progressing
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
## 2019-01-02 NOTE — Progress Notes (Signed)
*  PRELIMINARY RESULTS* Echocardiogram 2D Echocardiogram has been performed.  Laura Gonzalez 01/02/2019, 11:21 AM

## 2019-01-03 ENCOUNTER — Encounter: Payer: Self-pay | Admitting: Radiology

## 2019-01-03 ENCOUNTER — Inpatient Hospital Stay: Payer: Medicare Other

## 2019-01-03 LAB — BASIC METABOLIC PANEL
Anion gap: 7 (ref 5–15)
BUN: 27 mg/dL — ABNORMAL HIGH (ref 8–23)
CO2: 25 mmol/L (ref 22–32)
Calcium: 8.5 mg/dL — ABNORMAL LOW (ref 8.9–10.3)
Chloride: 106 mmol/L (ref 98–111)
Creatinine, Ser: 0.67 mg/dL (ref 0.44–1.00)
GFR calc Af Amer: >60 mL/min (ref >60)
GFR calc non Af Amer: >60 mL/min (ref >60)
Glucose, Bld: 109 mg/dL — ABNORMAL HIGH (ref 70–99)
Potassium: 4.3 mmol/L (ref 3.5–5.1)
Sodium: 138 mmol/L (ref 135–145)

## 2019-01-03 LAB — PROTIME-INR
INR: 2.5 — ABNORMAL HIGH (ref 0.8–1.2)
Prothrombin Time: 27 seconds — ABNORMAL HIGH (ref 11.4–15.2)

## 2019-01-03 MED ORDER — REGADENOSON 0.4 MG/5ML IV SOLN
0.4000 mg | Freq: Once | INTRAVENOUS | Status: AC
  Administered 2019-01-03: 0.4 mg via INTRAVENOUS

## 2019-01-03 MED ORDER — WARFARIN SODIUM 2 MG PO TABS
2.0000 mg | ORAL_TABLET | Freq: Once | ORAL | Status: AC
  Administered 2019-01-03: 18:00:00 2 mg via ORAL
  Filled 2019-01-03: qty 1

## 2019-01-03 MED ORDER — TECHNETIUM TC 99M TETROFOSMIN IV KIT
30.0000 | PACK | Freq: Once | INTRAVENOUS | Status: AC | PRN
  Administered 2019-01-03: 09:00:00 32.388 via INTRAVENOUS

## 2019-01-03 NOTE — Consult Note (Signed)
ANTICOAGULATION CONSULT NOTE - Follow up Laura Gonzalez for Warfarin Dosing Indication: atrial fibrillation  Allergies  Allergen Reactions  . Amoxicillin Other (See Comments)    Lips swelling, tingling Has patient had a PCN reaction causing immediate rash, facial/tongue/throat swelling, SOB or lightheadedness with hypotension: Yes Has patient had a PCN reaction causing severe rash involving mucus membranes or skin necrosis: No Has patient had a PCN reaction that required hospitalization: No Has patient had a PCN reaction occurring within the last 10 years: No If all of the above answers are "NO", then may proceed with Cephalosporin use.   . Benzocaine-Menthol     Throat swelling  . Chloraseptic Sore Throat [Acetaminophen] Other (See Comments)    throat swelling  . Biafine [Wound Dressings] Swelling  . Chlorpheniramine     Throat swelling  . Neosporin [Neomycin-Bacitracin Zn-Polymyx] Other (See Comments)    Skin redness, puffy  . Shellfish Allergy Swelling    "lips swell"  . Statins Other (See Comments)    Muscle weakness, weak  . Tape Other (See Comments)    Skin redness    Patient Measurements: Height: 5' 5.5" (166.4 cm) Weight: 299 lb (135.6 kg) IBW/kg (Calculated) : 58.15 Heparin Dosing Weight: 91.6 kg  Vital Signs: Temp: 97.4 F (36.3 C) (07/14 0723) Temp Source: Oral (07/14 0723) BP: 130/68 (07/14 0723) Pulse Rate: 73 (07/14 0723)  Labs: Recent Labs    01/01/19 1232 01/01/19 1407 01/01/19 1452 01/01/19 1708 01/02/19 0450 01/03/19 0558  HGB 15.4*  --   --   --  14.3  --   HCT 48.5*  --   --   --  44.8  --   PLT 253  --   --   --  253  --   LABPROT  --   --  33.9*  --  27.3* 27.0*  INR  --   --  3.4*  --  2.6* 2.5*  CREATININE 0.85  --   --   --  0.76 0.67  TROPONINIHS 6 6  --  7  --   --     Estimated Creatinine Clearance: 84.2 mL/min (by C-G formula based on SCr of 0.67 mg/dL).   Assessment: Pharmacy consulted dose warfarin for 77yo  patient admitted for chest pain and diagnosed with Afib with RVR. Patient was recently admitted to the hospital for similar symptoms but ruled out and discharged home and thought to have musculoskeletal chest pain.  She now returns with persistent symptoms and was noted to be in atrial fibrillation with rapid ventricular response.    Patient has past medical history of Afib and takes Warfarin 4mg  daily PTA.  Patient's last dose taken was yesterday, 7/11@1700 . INR an admittance is 3.4, Hgb 15.4. No signs/symptoms of bleeding noted. No major DDI, increase risk of bleeding with aspirin.   Home dose: warfarin 4 mg daily   Date INR Warfarin Dose  7/12 3.4 HOLD  7/13 2.6 2 mg ordered   7/14 2.5      Goal of Therapy:  INR 2-3 Monitor platelets by anticoagulation protocol: Yes   Plan:  INR therapeutic. Will give warfarin 2 mg x 1 tonight. Daily INR ordered. CBC stable. Monitor CBC at least every 3 days.   Rocky Morel, PharmD, BCPS Clinical Pharmacist 01/03/2019 2:04 PM

## 2019-01-03 NOTE — Progress Notes (Signed)
[place] [facility] [place] NAME: Laura Gonzalez    MR#:  [id number]  DATE OF BIRTH:  02-27-1941  SUBJECTIVE:  CHIEF COMPLAINT:  Pt is resting comfortably.  Had first part of the stress test done today.  Could not fall asleep last night but refusing any kind of medications ,denies any chest pain or palpitations or shortness of breath.  REVIEW OF SYSTEMS:  CONSTITUTIONAL: No fever, fatigue or weakness.  EYES: No blurred or double vision.  EARS, NOSE, AND THROAT: No tinnitus or ear pain.  RESPIRATORY: No cough, shortness of breath, wheezing or hemoptysis.  CARDIOVASCULAR: No chest pain, orthopnea, edema.  GASTROINTESTINAL: No nausea, vomiting, diarrhea or abdominal pain.  GENITOURINARY: No dysuria, hematuria.  ENDOCRINE: No polyuria, nocturia,  HEMATOLOGY: No anemia, easy bruising or bleeding SKIN: No rash or lesion. MUSCULOSKELETAL: No joint pain or arthritis.   NEUROLOGIC: No tingling, numbness, weakness.  PSYCHIATRY: No anxiety or depression.   DRUG ALLERGIES:   Allergies  Allergen Reactions  . Amoxicillin Other (See Comments)    Lips swelling, tingling Has patient had a PCN reaction causing immediate rash, facial/tongue/throat swelling, SOB or lightheadedness with hypotension: Yes Has patient had a PCN reaction causing severe rash involving mucus membranes or skin necrosis: No Has patient had a PCN reaction that required hospitalization: No Has patient had a PCN reaction occurring within the last 10 years: No If all of the above answers are "NO", then may proceed with Cephalosporin use.   . Benzocaine-Menthol     Throat swelling  . Chloraseptic Sore Throat [Acetaminophen] Other (See Comments)    throat swelling  . Biafine [Wound Dressings] Swelling  . Chlorpheniramine     Throat swelling  . Neosporin [Neomycin-Bacitracin Zn-Polymyx] Other (See Comments)    Skin redness, puffy  . Shellfish Allergy Swelling    "lips swell"  .  Statins Other (See Comments)    Muscle weakness, weak  . Tape Other (See Comments)    Skin redness    VITALS:  Blood pressure 130/68, pulse 73, temperature (!) 97.4 F (36.3 C), temperature source Oral, resp. rate 18, height 5' 5.5" (1.664 m), weight 135.6 kg, SpO2 97 %.  PHYSICAL EXAMINATION:  GENERAL:  78 y.o.-year-old patient lying in the bed with no acute distress.  EYES: Pupils equal, round, reactive to light and accommodation. No scleral icterus. Extraocular muscles intact.  HEENT: Head atraumatic, normocephalic. Oropharynx and nasopharynx clear.  NECK:  Supple, no jugular venous distention. No thyroid enlargement, no tenderness.  LUNGS: Normal breath sounds bilaterally, no wheezing, rales,rhonchi or crepitation. No use of accessory muscles of respiration.  CARDIOVASCULAR: S1, S2 normal. No murmurs, rubs, or gallops.  ABDOMEN: Soft, nontender, nondistended. Bowel sounds present.  EXTREMITIES: No pedal edema, cyanosis, or clubbing.  NEUROLOGIC: Cranial nerves II through XII are intact. Muscle strength at her baseline in all extremities. Sensation intact. Gait not checked.  PSYCHIATRIC: The patient is alert and oriented x 3.  SKIN: No obvious rash, lesion, or ulcer.    LABORATORY PANEL:   CBC Recent Labs  Lab 01/02/19 0450  WBC 10.9*  HGB 14.3  HCT 44.8  PLT 253   ------------------------------------------------------------------------------------------------------------------  Chemistries  Recent Labs  Lab 01/03/19 0558  NA 138  K 4.3  CL 106  CO2 25  GLUCOSE 109*  BUN 27*  CREATININE 0.67  CALCIUM 8.5*   ------------------------------------------------------------------------------------------------------------------  Cardiac Enzymes No results for input(s): TROPONINI in the last 168 hours. ------------------------------------------------------------------------------------------------------------------  RADIOLOGY:  No  results found.  EKG:   Orders  placed or performed during the hospital encounter of 01/01/19  . EKG 12-Lead  . EKG 12-Lead  . ED EKG  . ED EKG  . EKG 12-Lead  . EKG 12-Lead    ASSESSMENT AND PLAN:    78 y.o. female with a known history of morbid obesity, atrial fibrillation, osteoarthritis, GERD, obstructive sleep apnea, hypertension, recent admission for chest pain who presents to the hospital complaining of chest pain again and noted to be in atrial fibrillation with rapid ventricular response.  1.  Atrial fibrillation with rapid ventricular response-  Patient is clinically improving.  Off Cardizem drip  -We will continue her oral metoprolol and Cardizem titrate as needed -Patient is on Coumadin will consult pharmacy for dosing.  INR is therapeutic presently. -Echocardiogram done results pending [facility] cardiology is following.  Patient had first step stress test today and get the second step tomorrow -INR therapeutic at 2.5  2.  History of chronic diastolic CHF-clinically patient is on congestive heart failure. -Continue Lasix, metoprolol.  3.  Asthma-no acute exacerbation.  Continue albuterol nebulizer and Flonase and inhalers. -Continue Claritin.  4.  Hyperlipidemia-continue Zetia.  5.  Osteoporosis-continue vitamin D supplements.  6.  Insomnia patient is refusing any kind of medications    All the records are reviewed and case discussed with Care Management/Social Workerr. Management plans discussed with the patient, she is  in agreement.  CODE STATUS: fc   TOTAL TIME TAKING CARE OF THIS PATIENT: 35 minutes.   POSSIBLE D/C IN 1-2  DAYS, DEPENDING ON CLINICAL CONDITION.  Note: This dictation was prepared with Dragon dictation along with smaller phrase technology. Any transcriptional errors that result from this process are unintentional.   [name] M.D on 01/03/2019 at 4:46 PM  Between 7am to 6pm - Pager - [phone] After 6pm go to [email or web address] - password [email or web address]  Office  [phone]  CC: Primary care physician; Baxter Hire, MD

## 2019-01-04 ENCOUNTER — Inpatient Hospital Stay: Payer: Medicare Other

## 2019-01-04 ENCOUNTER — Encounter: Payer: Self-pay | Admitting: Radiology

## 2019-01-04 LAB — PROTIME-INR
INR: 2.2 — ABNORMAL HIGH (ref 0.8–1.2)
Prothrombin Time: 24.2 seconds — ABNORMAL HIGH (ref 11.4–15.2)

## 2019-01-04 MED ORDER — CALCIUM CARBONATE ANTACID 500 MG PO CHEW: 1.0000 | CHEWABLE_TABLET | Freq: Two times a day (BID) | ORAL | 0 refills | Status: DC

## 2019-01-04 MED ORDER — TECHNETIUM TC 99M TETROFOSMIN IV KIT
30.0000 | PACK | Freq: Once | INTRAVENOUS | Status: AC | PRN
  Administered 2019-01-04: 30.823 via INTRAVENOUS

## 2019-01-04 MED ORDER — METOPROLOL TARTRATE 50 MG PO TABS: 50.0000 mg | ORAL_TABLET | Freq: Two times a day (BID) | ORAL | 1 refills | Status: DC

## 2019-01-04 MED ORDER — DILTIAZEM HCL 60 MG PO TABS: 60.0000 mg | ORAL_TABLET | Freq: Every day | ORAL | 0 refills | Status: DC | PRN

## 2019-01-04 MED ORDER — WARFARIN SODIUM 3 MG PO TABS
3.0000 mg | ORAL_TABLET | Freq: Once | ORAL | Status: DC
  Filled 2019-01-04: qty 1

## 2019-01-04 MED ORDER — DILTIAZEM HCL ER COATED BEADS 300 MG PO CP24: 300.0000 mg | ORAL_CAPSULE | Freq: Every day | ORAL | 1 refills | Status: DC

## 2019-01-04 NOTE — Consult Note (Signed)
ANTICOAGULATION CONSULT NOTE - Follow up [facility] for Warfarin Dosing Indication: atrial fibrillation  Allergies  Allergen Reactions  . Amoxicillin Other (See Comments)    Lips swelling, tingling Has patient had a PCN reaction causing immediate rash, facial/tongue/throat swelling, SOB or lightheadedness with hypotension: Yes Has patient had a PCN reaction causing severe rash involving mucus membranes or skin necrosis: No Has patient had a PCN reaction that required hospitalization: No Has patient had a PCN reaction occurring within the last 10 years: No If all of the above answers are "NO", then may proceed with Cephalosporin use.   . Benzocaine-Menthol     Throat swelling  . Chloraseptic Sore Throat [Acetaminophen] Other (See Comments)    throat swelling  . Biafine [Wound Dressings] Swelling  . Chlorpheniramine     Throat swelling  . Neosporin [Neomycin-Bacitracin Zn-Polymyx] Other (See Comments)    Skin redness, puffy  . Shellfish Allergy Swelling    "lips swell"  . Statins Other (See Comments)    Muscle weakness, weak  . Tape Other (See Comments)    Skin redness    Patient Measurements: Height: 5' 5.5" (166.4 cm) Weight: 299 lb (135.6 kg) IBW/kg (Calculated) : 58.15 Heparin Dosing Weight: 91.6 kg  Vital Signs: Temp: 97.5 F (36.4 C) (07/15 0751) Temp Source: Oral (07/15 0751) BP: 126/75 (07/15 0751) Pulse Rate: 73 (07/15 0751)  Labs: Recent Labs    01/01/19 1232 01/01/19 1407  01/01/19 1708 01/02/19 0450 01/03/19 0558 01/04/19 0511  HGB 15.4*  --   --   --  14.3  --   --   HCT 48.5*  --   --   --  44.8  --   --   PLT 253  --   --   --  253  --   --   LABPROT  --   --    < >  --  27.3* 27.0* 24.2*  INR  --   --    < >  --  2.6* 2.5* 2.2*  CREATININE 0.85  --   --   --  0.76 0.67  --   TROPONINIHS 6 6  --  7  --   --   --    < > = values in this interval not displayed.    Estimated Creatinine Clearance: 84.2 mL/min (by C-G formula  based on SCr of 0.67 mg/dL).   Assessment: Pharmacy consulted dose warfarin for 78yo patient admitted for chest pain and diagnosed with Afib with RVR. Patient was recently admitted to the hospital for similar symptoms but ruled out and discharged home and thought to have musculoskeletal chest pain.  She now returns with persistent symptoms and was noted to be in atrial fibrillation with rapid ventricular response.    Patient has past medical history of Afib and takes Warfarin 4mg  daily PTA.  Patient's last dose taken was yesterday, 7/11@1700 . INR an admittance is 3.4, Hgb 15.4. No signs/symptoms of bleeding noted. No major DDI, increase risk of bleeding with aspirin.   Home dose: warfarin 4 mg daily   Date INR Warfarin Dose  7/12 3.4 HOLD  7/13 2.6 2 mg   7/14 2.5 2 mg  7/15 2.2 3 mg     Goal of Therapy:  INR 2-3 Monitor platelets by anticoagulation protocol: Yes   Plan:  INR therapeutic. Will give warfarin 3 mg x 1 tonight.  Daily INR ordered. CBC stable. Monitor CBC at least every 3 days.   [employer]  Serita Grit, PharmD, BCPS Clinical Pharmacist 01/04/2019 9:18 AM
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
## 2019-01-04 NOTE — Progress Notes (Signed)
myoview showed normal lv funciton and no evidence of ischemia. No further ischemic workup indicated. Would ambulate and if heart rate remains controlled consider discharge with out patient follow up in our office in 1 week.

## 2019-01-04 NOTE — Care Management Important Message (Signed)
Important Message  Patient Details  Name: Laura Gonzalez MRN: [id number] Date of Birth: 05/21/1941   Medicare Important Message Given:  Yes     [name] 01/04/2019, 11:18 AM

## 2019-01-04 NOTE — Progress Notes (Signed)
IV and tele removed from patient. Discharge instructions given to patient. Verbalized understanding. No acute distress at this time. [facility] to transport patient home.

## 2019-01-04 NOTE — Progress Notes (Signed)
Patient ambulated around nurses station, tolerated well and  HR remained controlled, MD notified.

## 2019-01-04 NOTE — TOC Transition Note (Addendum)
Transition of Care [facility]) - CM/SW Discharge Note   Patient Details  Name: Laura Gonzalez MRN: [id number] Date of Birth: 08-21-1940  Transition of Care [facility]) CM/SW Contact:  Ross Ludwig, LCSW Phone Number: 01/04/2019, 5:30 PM   Clinical Narrative:     Patient was set up with [facility] for home health nursing.  CSW contacted [place] at [facility] and they can still accept patient.  Patient is in agreement to having home health RN through [facility].  Patient did not express any other concerns or issues about returning back home.   Final next level of care: [place] Barriers to Discharge: Barriers Resolved   Patient Goals and CMS Choice Patient states their goals for this hospitalization and ongoing recovery are:: To return back to [email or web address].gov Compare Post Acute Care list provided to:: Patient Choice offered to / list presented to : Patient  Discharge Placement  Patient plans to return to [facility].                 Discharge Plan and Services                          HH Arranged: RN          Social Determinants of Health (SDOH) Interventions     Readmission Risk Interventions No flowsheet data found.

## 2019-01-13 LAB — NM MYOCAR MULTI W/SPECT W/WALL MOTION / EF
Estimated workload: 1 METS
Exercise duration (min): 1 min
Exercise duration (sec): 0 s
LV dias vol: 35 mL (ref 46–106)
LV sys vol: 13 mL
MPHR: 144 {beats}/min
Peak HR: 111 {beats}/min
Percent HR: 77 %
Rest HR: 71 {beats}/min
SDS: 0
SRS: 16
SSS: 0
TID: 0.89

## 2019-03-29 ENCOUNTER — Ambulatory Visit: Payer: Medicare Other | Admitting: Family

## 2019-05-07 ENCOUNTER — Encounter: Payer: Self-pay | Admitting: Emergency Medicine

## 2019-05-07 ENCOUNTER — Emergency Department
Admission: EM | Admit: 2019-05-07 | Discharge: 2019-05-07 | Disposition: A | Discharge status: Skilled Nursing Facility | Payer: Medicare Other | Attending: Emergency Medicine | Admitting: Emergency Medicine

## 2019-05-07 ENCOUNTER — Emergency Department: Payer: Medicare Other

## 2019-05-07 ENCOUNTER — Other Ambulatory Visit: Payer: Self-pay

## 2019-05-07 DIAGNOSIS — R0602 Shortness of breath: Secondary | ICD-10-CM | POA: Diagnosis present

## 2019-05-07 DIAGNOSIS — Z85828 Personal history of other malignant neoplasm of skin: Secondary | ICD-10-CM | POA: Insufficient documentation

## 2019-05-07 DIAGNOSIS — I110 Hypertensive heart disease with heart failure: Secondary | ICD-10-CM | POA: Insufficient documentation

## 2019-05-07 DIAGNOSIS — J45909 Unspecified asthma, uncomplicated: Secondary | ICD-10-CM | POA: Diagnosis not present

## 2019-05-07 DIAGNOSIS — R0600 Dyspnea, unspecified: Secondary | ICD-10-CM | POA: Insufficient documentation

## 2019-05-07 DIAGNOSIS — Z20828 Contact with and (suspected) exposure to other viral communicable diseases: Secondary | ICD-10-CM | POA: Diagnosis not present

## 2019-05-07 DIAGNOSIS — R0789 Other chest pain: Secondary | ICD-10-CM

## 2019-05-07 DIAGNOSIS — I509 Heart failure, unspecified: Secondary | ICD-10-CM | POA: Diagnosis not present

## 2019-05-07 LAB — BASIC METABOLIC PANEL
Anion gap: 13 (ref 5–15)
BUN: 16 mg/dL (ref 8–23)
CO2: 25 mmol/L (ref 22–32)
Calcium: 10.2 mg/dL (ref 8.9–10.3)
Chloride: 102 mmol/L (ref 98–111)
Creatinine, Ser: 0.9 mg/dL (ref 0.44–1.00)
GFR calc Af Amer: >60 mL/min (ref >60)
GFR calc non Af Amer: >60 mL/min (ref >60)
Glucose, Bld: 120 mg/dL — ABNORMAL HIGH (ref 70–99)
Potassium: 4.8 mmol/L (ref 3.5–5.1)
Sodium: 140 mmol/L (ref 135–145)

## 2019-05-07 LAB — BLOOD GAS, VENOUS
Acid-Base Excess: 4 mmol/L — ABNORMAL HIGH (ref 0.0–2.0)
Bicarbonate: 29.2 mmol/L — ABNORMAL HIGH (ref 20.0–28.0)
O2 Saturation: 66.9 %
Patient temperature: 37
pCO2, Ven: 45 mmHg (ref 44.0–60.0)
pH, Ven: 7.42 (ref 7.250–7.430)
pO2, Ven: 34 mmHg (ref 32.0–45.0)

## 2019-05-07 LAB — CBC WITH DIFFERENTIAL/PLATELET
Abs Immature Granulocytes: 0.04 10*3/uL (ref 0.00–0.07)
Basophils Absolute: 0 10*3/uL (ref 0.0–0.1)
Basophils Relative: 0 %
Eosinophils Absolute: 0 10*3/uL (ref 0.0–0.5)
Eosinophils Relative: 0 %
HCT: 42.7 % (ref 36.0–46.0)
Hemoglobin: 13.8 g/dL (ref 12.0–15.0)
Immature Granulocytes: 0 %
Lymphocytes Relative: 6 %
Lymphs Abs: 0.8 10*3/uL (ref 0.7–4.0)
MCH: 30.1 pg (ref 26.0–34.0)
MCHC: 32.3 g/dL (ref 30.0–36.0)
MCV: 93 fL (ref 80.0–100.0)
Monocytes Absolute: 1 10*3/uL (ref 0.1–1.0)
Monocytes Relative: 7 %
Neutro Abs: 12.7 10*3/uL — ABNORMAL HIGH (ref 1.7–7.7)
Neutrophils Relative %: 87 %
Platelets: 218 10*3/uL (ref 150–400)
RBC: 4.59 MIL/uL (ref 3.87–5.11)
RDW: 13.5 % (ref 11.5–15.5)
WBC: 14.6 10*3/uL — ABNORMAL HIGH (ref 4.0–10.5)
nRBC: 0 % (ref 0.0–0.2)

## 2019-05-07 LAB — PROTIME-INR
INR: 3.1 — ABNORMAL HIGH (ref 0.8–1.2)
Prothrombin Time: 31.6 seconds — ABNORMAL HIGH (ref 11.4–15.2)

## 2019-05-07 LAB — BRAIN NATRIURETIC PEPTIDE: B Natriuretic Peptide: 179 pg/mL — ABNORMAL HIGH (ref 0.0–100.0)

## 2019-05-07 LAB — TROPONIN I (HIGH SENSITIVITY)
Troponin I (High Sensitivity): 3 ng/L (ref <18)
Troponin I (High Sensitivity): 3 ng/L (ref <18)

## 2019-05-07 LAB — SARS CORONAVIRUS 2 (TAT 6-24 HRS): SARS Coronavirus 2: NEGATIVE

## 2019-05-07 MED ORDER — PREDNISONE 20 MG PO TABS
50.0000 mg | ORAL_TABLET | Freq: Once | ORAL | Status: AC
  Administered 2019-05-07: 50 mg via ORAL
  Filled 2019-05-07: qty 2

## 2019-05-07 MED ORDER — DILTIAZEM HCL 25 MG/5ML IV SOLN
10.0000 mg | Freq: Once | INTRAVENOUS | Status: DC
  Filled 2019-05-07: qty 5

## 2019-05-07 MED ORDER — OXYCODONE HCL 5 MG PO TABS
5.0000 mg | ORAL_TABLET | Freq: Once | ORAL | Status: AC
  Administered 2019-05-07: 5 mg via ORAL
  Filled 2019-05-07: qty 1

## 2019-05-07 MED ORDER — MORPHINE SULFATE (PF) 4 MG/ML IV SOLN
4.0000 mg | Freq: Once | INTRAVENOUS | Status: AC
  Administered 2019-05-07: 4 mg via INTRAVENOUS
  Filled 2019-05-07: qty 1

## 2019-05-07 MED ORDER — OXYCODONE HCL 5 MG PO TABS: 2.5000 mg | ORAL_TABLET | Freq: Three times a day (TID) | ORAL | 0 refills | Status: DC | PRN

## 2019-05-07 MED ORDER — PREDNISONE 50 MG PO TABS: 50.0000 mg | ORAL_TABLET | Freq: Every day | ORAL | 0 refills | Status: DC

## 2019-05-07 MED ORDER — IPRATROPIUM-ALBUTEROL 0.5-2.5 (3) MG/3ML IN SOLN
3.0000 mL | Freq: Once | RESPIRATORY_TRACT | Status: AC
  Administered 2019-05-07: 3 mL via RESPIRATORY_TRACT
  Filled 2019-05-07: qty 3

## 2019-05-07 NOTE — ED Provider Notes (Signed)
Asked to follow-up on second troponin which is unchanged.  Patient feels better after morphine.  Her heart rate is normal.  She is satting at her baseline.  We will start her on a brief course of prednisone and pain medication but given that she is feeling better with reassuring work-up overall appropriate for discharge at this time   Lavonia Drafts, MD 05/07/19 1747

## 2019-05-07 NOTE — ED Notes (Signed)
Pt does not appear SOB at rest, but some SOB with exertion.
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
## 2019-05-07 NOTE — ED Notes (Signed)
Respiratory complaint note should have time of 1605

## 2019-05-07 NOTE — ED Notes (Addendum)
Pt states she is not feeling well enough to go home. This RN spoke with Dr. Corky Downs, updated him on pt's O2 sats, then this RN spoke with pt to state that EDP feels like she is well enough to be discharged.

## 2019-05-07 NOTE — ED Notes (Signed)
Pt's O2 sat remaining between 90-93% on room air. MD aware and states pt appropriate for discharge.

## 2019-05-07 NOTE — ED Triage Notes (Signed)
Pt presents to ED via [facility] from home with c/o SOB. EMS reports that pt has hx of A-Fib, EMS reports pt upper 80's on RA upon their arrival, reports bilateral lower lobe diminished. EMS reports gave duoneb en route, 20g initiated to R hand by EMS. EMS reports seen in October for same, has had 2 negative Covid tests. EMS reports VSS en route.

## 2019-07-06 ENCOUNTER — Encounter: Payer: Self-pay | Admitting: Emergency Medicine

## 2019-07-06 ENCOUNTER — Emergency Department: Payer: Medicare Other

## 2019-07-06 ENCOUNTER — Inpatient Hospital Stay
Admission: EM | Admit: 2019-07-06 | Discharge: 2019-07-08 | DRG: 865 | Disposition: A | Discharge status: Home Health | Payer: Medicare Other | Attending: Family Medicine | Admitting: Family Medicine

## 2019-07-06 ENCOUNTER — Other Ambulatory Visit: Payer: Self-pay

## 2019-07-06 DIAGNOSIS — M810 Age-related osteoporosis without current pathological fracture: Secondary | ICD-10-CM | POA: Diagnosis present

## 2019-07-06 DIAGNOSIS — K219 Gastro-esophageal reflux disease without esophagitis: Secondary | ICD-10-CM | POA: Diagnosis present

## 2019-07-06 DIAGNOSIS — Z9989 Dependence on other enabling machines and devices: Secondary | ICD-10-CM | POA: Diagnosis not present

## 2019-07-06 DIAGNOSIS — E785 Hyperlipidemia, unspecified: Secondary | ICD-10-CM

## 2019-07-06 DIAGNOSIS — Z86010 Personal history of colonic polyps: Secondary | ICD-10-CM | POA: Diagnosis not present

## 2019-07-06 DIAGNOSIS — Z7983 Long term (current) use of bisphosphonates: Secondary | ICD-10-CM

## 2019-07-06 DIAGNOSIS — Z888 Allergy status to other drugs, medicaments and biological substances status: Secondary | ICD-10-CM | POA: Diagnosis not present

## 2019-07-06 DIAGNOSIS — I10 Essential (primary) hypertension: Secondary | ICD-10-CM | POA: Diagnosis not present

## 2019-07-06 DIAGNOSIS — I110 Hypertensive heart disease with heart failure: Secondary | ICD-10-CM | POA: Diagnosis present

## 2019-07-06 DIAGNOSIS — Z91013 Allergy to seafood: Secondary | ICD-10-CM

## 2019-07-06 DIAGNOSIS — Z79899 Other long term (current) drug therapy: Secondary | ICD-10-CM

## 2019-07-06 DIAGNOSIS — Z8261 Family history of arthritis: Secondary | ICD-10-CM

## 2019-07-06 DIAGNOSIS — J9601 Acute respiratory failure with hypoxia: Secondary | ICD-10-CM | POA: Diagnosis present

## 2019-07-06 DIAGNOSIS — J45901 Unspecified asthma with (acute) exacerbation: Secondary | ICD-10-CM | POA: Diagnosis present

## 2019-07-06 DIAGNOSIS — Z7901 Long term (current) use of anticoagulants: Secondary | ICD-10-CM

## 2019-07-06 DIAGNOSIS — Z91048 Other nonmedicinal substance allergy status: Secondary | ICD-10-CM

## 2019-07-06 DIAGNOSIS — I4819 Other persistent atrial fibrillation: Secondary | ICD-10-CM | POA: Diagnosis present

## 2019-07-06 DIAGNOSIS — I739 Peripheral vascular disease, unspecified: Secondary | ICD-10-CM | POA: Diagnosis present

## 2019-07-06 DIAGNOSIS — X58XXXA Exposure to other specified factors, initial encounter: Secondary | ICD-10-CM | POA: Diagnosis present

## 2019-07-06 DIAGNOSIS — Z7982 Long term (current) use of aspirin: Secondary | ICD-10-CM

## 2019-07-06 DIAGNOSIS — Z803 Family history of malignant neoplasm of breast: Secondary | ICD-10-CM

## 2019-07-06 DIAGNOSIS — Z981 Arthrodesis status: Secondary | ICD-10-CM | POA: Diagnosis not present

## 2019-07-06 DIAGNOSIS — I5032 Chronic diastolic (congestive) heart failure: Secondary | ICD-10-CM | POA: Diagnosis present

## 2019-07-06 DIAGNOSIS — R7303 Prediabetes: Secondary | ICD-10-CM | POA: Diagnosis present

## 2019-07-06 DIAGNOSIS — Z800 Family history of malignant neoplasm of digestive organs: Secondary | ICD-10-CM

## 2019-07-06 DIAGNOSIS — I878 Other specified disorders of veins: Secondary | ICD-10-CM | POA: Diagnosis present

## 2019-07-06 DIAGNOSIS — N179 Acute kidney failure, unspecified: Secondary | ICD-10-CM | POA: Diagnosis present

## 2019-07-06 DIAGNOSIS — Z880 Allergy status to penicillin: Secondary | ICD-10-CM

## 2019-07-06 DIAGNOSIS — Z808 Family history of malignant neoplasm of other organs or systems: Secondary | ICD-10-CM

## 2019-07-06 DIAGNOSIS — Z806 Family history of leukemia: Secondary | ICD-10-CM

## 2019-07-06 DIAGNOSIS — T881XXA Other complications following immunization, not elsewhere classified, initial encounter: Secondary | ICD-10-CM | POA: Diagnosis present

## 2019-07-06 DIAGNOSIS — Z96651 Presence of right artificial knee joint: Secondary | ICD-10-CM | POA: Diagnosis present

## 2019-07-06 DIAGNOSIS — J40 Bronchitis, not specified as acute or chronic: Secondary | ICD-10-CM

## 2019-07-06 DIAGNOSIS — G4733 Obstructive sleep apnea (adult) (pediatric): Secondary | ICD-10-CM | POA: Diagnosis present

## 2019-07-06 DIAGNOSIS — Z7952 Long term (current) use of systemic steroids: Secondary | ICD-10-CM

## 2019-07-06 DIAGNOSIS — Z20822 Contact with and (suspected) exposure to covid-19: Secondary | ICD-10-CM | POA: Diagnosis present

## 2019-07-06 DIAGNOSIS — Z85828 Personal history of other malignant neoplasm of skin: Secondary | ICD-10-CM

## 2019-07-06 DIAGNOSIS — Z9012 Acquired absence of left breast and nipple: Secondary | ICD-10-CM

## 2019-07-06 DIAGNOSIS — Z7951 Long term (current) use of inhaled steroids: Secondary | ICD-10-CM

## 2019-07-06 DIAGNOSIS — Z8249 Family history of ischemic heart disease and other diseases of the circulatory system: Secondary | ICD-10-CM

## 2019-07-06 LAB — BASIC METABOLIC PANEL
Anion gap: 9 (ref 5–15)
BUN: 15 mg/dL (ref 8–23)
CO2: 28 mmol/L (ref 22–32)
Calcium: 9.7 mg/dL (ref 8.9–10.3)
Chloride: 103 mmol/L (ref 98–111)
Creatinine, Ser: 1.03 mg/dL — ABNORMAL HIGH (ref 0.44–1.00)
GFR calc Af Amer: >60 mL/min (ref >60)
GFR calc non Af Amer: 52 mL/min — ABNORMAL LOW (ref >60)
Glucose, Bld: 120 mg/dL — ABNORMAL HIGH (ref 70–99)
Potassium: 4.2 mmol/L (ref 3.5–5.1)
Sodium: 140 mmol/L (ref 135–145)

## 2019-07-06 LAB — RESPIRATORY PANEL BY RT PCR (FLU A&B, COVID)
Influenza A by PCR: NEGATIVE
Influenza B by PCR: NEGATIVE
SARS Coronavirus 2 by RT PCR: NEGATIVE

## 2019-07-06 LAB — CBC
HCT: 44 % (ref 36.0–46.0)
Hemoglobin: 13.6 g/dL (ref 12.0–15.0)
MCH: 29 pg (ref 26.0–34.0)
MCHC: 30.9 g/dL (ref 30.0–36.0)
MCV: 93.8 fL (ref 80.0–100.0)
Platelets: 221 10*3/uL (ref 150–400)
RBC: 4.69 MIL/uL (ref 3.87–5.11)
RDW: 15.6 % — ABNORMAL HIGH (ref 11.5–15.5)
WBC: 9.2 10*3/uL (ref 4.0–10.5)
nRBC: 0 % (ref 0.0–0.2)

## 2019-07-06 LAB — BRAIN NATRIURETIC PEPTIDE: B Natriuretic Peptide: 218 pg/mL — ABNORMAL HIGH (ref 0.0–100.0)

## 2019-07-06 LAB — PROTIME-INR
INR: 2.6 — ABNORMAL HIGH (ref 0.8–1.2)
Prothrombin Time: 27.9 seconds — ABNORMAL HIGH (ref 11.4–15.2)

## 2019-07-06 LAB — APTT: aPTT: 57 seconds — ABNORMAL HIGH (ref 24–36)

## 2019-07-06 LAB — TROPONIN I (HIGH SENSITIVITY)
Troponin I (High Sensitivity): 3 ng/L (ref <18)
Troponin I (High Sensitivity): <2 ng/L (ref <18)

## 2019-07-06 MED ORDER — DILTIAZEM HCL ER COATED BEADS 180 MG PO CP24
300.0000 mg | ORAL_CAPSULE | Freq: Every day | ORAL | Status: DC
  Administered 2019-07-07 – 2019-07-08 (×2): 300 mg via ORAL
  Filled 2019-07-06 (×2): qty 1

## 2019-07-06 MED ORDER — LORATADINE 10 MG PO TABS
10.0000 mg | ORAL_TABLET | Freq: Every day | ORAL | Status: DC
  Administered 2019-07-07 – 2019-07-08 (×2): 10 mg via ORAL
  Filled 2019-07-06 (×2): qty 1

## 2019-07-06 MED ORDER — EZETIMIBE 10 MG PO TABS
10.0000 mg | ORAL_TABLET | Freq: Every day | ORAL | Status: DC
  Administered 2019-07-07 – 2019-07-08 (×2): 10 mg via ORAL
  Filled 2019-07-06 (×2): qty 1

## 2019-07-06 MED ORDER — IPRATROPIUM-ALBUTEROL 0.5-2.5 (3) MG/3ML IN SOLN
3.0000 mL | Freq: Once | RESPIRATORY_TRACT | Status: AC
  Administered 2019-07-06: 3 mL via RESPIRATORY_TRACT
  Filled 2019-07-06: qty 3

## 2019-07-06 MED ORDER — SODIUM CHLORIDE 0.9 % IV BOLUS
500.0000 mL | Freq: Once | INTRAVENOUS | Status: AC
  Administered 2019-07-06: 500 mL via INTRAVENOUS

## 2019-07-06 MED ORDER — MONTELUKAST SODIUM 10 MG PO TABS
10.0000 mg | ORAL_TABLET | Freq: Every day | ORAL | Status: DC
  Administered 2019-07-06 – 2019-07-07 (×2): 10 mg via ORAL
  Filled 2019-07-06 (×2): qty 1

## 2019-07-06 MED ORDER — SODIUM CHLORIDE 0.9% FLUSH
3.0000 mL | Freq: Once | INTRAVENOUS | Status: AC
  Administered 2019-07-06: 3 mL via INTRAVENOUS

## 2019-07-06 MED ORDER — METOPROLOL TARTRATE 50 MG PO TABS
50.0000 mg | ORAL_TABLET | Freq: Two times a day (BID) | ORAL | Status: DC
  Administered 2019-07-06 – 2019-07-08 (×3): 50 mg via ORAL
  Filled 2019-07-06 (×4): qty 1

## 2019-07-06 MED ORDER — MORPHINE SULFATE (PF) 2 MG/ML IV SOLN: 1.0000 mg | INTRAVENOUS | Status: DC | PRN

## 2019-07-06 MED ORDER — SODIUM CHLORIDE 0.9 % IV SOLN
INTRAVENOUS | Status: DC | PRN
  Administered 2019-07-06: 500 mL via INTRAVENOUS

## 2019-07-06 MED ORDER — WARFARIN - PHARMACIST DOSING INPATIENT
Freq: Every day | Status: DC
  Filled 2019-07-06: qty 1

## 2019-07-06 MED ORDER — IOHEXOL 350 MG/ML SOLN
100.0000 mL | Freq: Once | INTRAVENOUS | Status: AC | PRN
  Administered 2019-07-06: 100 mL via INTRAVENOUS
  Filled 2019-07-06: qty 100

## 2019-07-06 MED ORDER — VITAMIN B-12 1000 MCG PO TABS
3000.0000 ug | ORAL_TABLET | Freq: Every day | ORAL | Status: DC
  Administered 2019-07-07 – 2019-07-08 (×2): 3000 ug via ORAL
  Filled 2019-07-06 (×2): qty 3

## 2019-07-06 MED ORDER — METHYLPREDNISOLONE SODIUM SUCC 125 MG IJ SOLR
125.0000 mg | Freq: Once | INTRAMUSCULAR | Status: AC
  Administered 2019-07-06: 125 mg via INTRAVENOUS
  Filled 2019-07-06: qty 2

## 2019-07-06 MED ORDER — OXYCODONE HCL 5 MG PO TABS
5.0000 mg | ORAL_TABLET | Freq: Once | ORAL | Status: AC
  Administered 2019-07-06: 5 mg via ORAL
  Filled 2019-07-06: qty 1

## 2019-07-06 MED ORDER — LEVALBUTEROL HCL 1.25 MG/0.5ML IN NEBU
1.2500 mg | INHALATION_SOLUTION | Freq: Four times a day (QID) | RESPIRATORY_TRACT | Status: DC
  Administered 2019-07-07 (×4): 1.25 mg via RESPIRATORY_TRACT
  Filled 2019-07-06 (×6): qty 0.5

## 2019-07-06 MED ORDER — WARFARIN SODIUM 4 MG PO TABS
4.0000 mg | ORAL_TABLET | Freq: Once | ORAL | Status: AC
  Administered 2019-07-06: 4 mg via ORAL
  Filled 2019-07-06: qty 1

## 2019-07-06 MED ORDER — MAGNESIUM OXIDE 400 (241.3 MG) MG PO TABS
800.0000 mg | ORAL_TABLET | Freq: Every evening | ORAL | Status: DC
  Administered 2019-07-07: 800 mg via ORAL
  Filled 2019-07-06: qty 2

## 2019-07-06 MED ORDER — ENSURE ENLIVE PO LIQD: 237.0000 mL | Freq: Two times a day (BID) | ORAL | Status: DC

## 2019-07-06 MED ORDER — IPRATROPIUM BROMIDE 0.02 % IN SOLN
0.5000 mg | Freq: Four times a day (QID) | RESPIRATORY_TRACT | Status: DC
  Administered 2019-07-07 (×4): 0.5 mg via RESPIRATORY_TRACT
  Filled 2019-07-06 (×4): qty 2.5

## 2019-07-06 MED ORDER — VITAMIN D 25 MCG (1000 UNIT) PO TABS
2000.0000 [IU] | ORAL_TABLET | Freq: Every day | ORAL | Status: DC
  Administered 2019-07-07 – 2019-07-08 (×2): 2000 [IU] via ORAL
  Filled 2019-07-06 (×5): qty 2

## 2019-07-06 MED ORDER — POTASSIUM CHLORIDE 20 MEQ PO PACK
20.0000 meq | PACK | Freq: Two times a day (BID) | ORAL | Status: DC
  Administered 2019-07-06 – 2019-07-08 (×4): 20 meq via ORAL
  Filled 2019-07-06 (×4): qty 1

## 2019-07-06 MED ORDER — CALCIUM CARBONATE ANTACID 500 MG PO CHEW
1.0000 | CHEWABLE_TABLET | Freq: Two times a day (BID) | ORAL | Status: DC
  Administered 2019-07-06 – 2019-07-08 (×4): 200 mg via ORAL
  Filled 2019-07-06 (×4): qty 1

## 2019-07-06 MED ORDER — MAGNESIUM SULFATE 2 GM/50ML IV SOLN
2.0000 g | Freq: Once | INTRAVENOUS | Status: AC
  Administered 2019-07-06: 2 g via INTRAVENOUS
  Filled 2019-07-06: qty 50

## 2019-07-06 NOTE — ED Notes (Signed)
Call to give report 
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
## 2019-07-06 NOTE — ED Notes (Signed)
Attempted to call report without success

## 2019-07-06 NOTE — ED Notes (Signed)
First nurse note: Pt here via EMS with c/o some shortness of breath-had Covid vaccine on Tuesday and states she thinks she is having allergic reaction to it. hx of asthma, inhaler and neb "didn't help." VSS; temp 100.3, LLL diminished per EMS.

## 2019-07-06 NOTE — ED Notes (Signed)
Pt's oxygen sat 87% with ambulation to bathroom. States does not use oxygen at home. Was in no distress with this reading.

## 2019-07-06 NOTE — ED Notes (Signed)
Pt placed on monitor.  Pt is alert and oriented.  Call bell given.
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
## 2019-07-06 NOTE — ED Triage Notes (Signed)
Pt here via EMS from home with c/o cp, shob, and chills after covid vaccine on Tuesday, symptoms began yesterday. Hx asthma, NAD.

## 2019-07-06 NOTE — ED Notes (Signed)
Pt was 80% on room air returning to the room. Placed on 3 liters to get her oxygen back up to 94%.

## 2019-07-06 NOTE — ED Notes (Signed)
Pt to room via wheelchair, [facility] noted when speaking in long sentences. Pt A&Ox4 and in NAD at this time.

## 2019-07-06 NOTE — ED Notes (Signed)
Ordered a hospital bed for pt comfort

## 2019-07-06 NOTE — ED Notes (Signed)
EKG in triage Afib rate 90. Pt has hx of afib and felt she went into it this am, rate 140.
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
## 2019-07-07 DIAGNOSIS — J9601 Acute respiratory failure with hypoxia: Secondary | ICD-10-CM

## 2019-07-07 LAB — MAGNESIUM: Magnesium: 2.5 mg/dL — ABNORMAL HIGH (ref 1.7–2.4)

## 2019-07-07 LAB — CBC
HCT: 40.2 % (ref 36.0–46.0)
Hemoglobin: 12.7 g/dL (ref 12.0–15.0)
MCH: 29.4 pg (ref 26.0–34.0)
MCHC: 31.6 g/dL (ref 30.0–36.0)
MCV: 93.1 fL (ref 80.0–100.0)
Platelets: 192 10*3/uL (ref 150–400)
RBC: 4.32 MIL/uL (ref 3.87–5.11)
RDW: 15.3 % (ref 11.5–15.5)
WBC: 9.7 10*3/uL (ref 4.0–10.5)
nRBC: 0 % (ref 0.0–0.2)

## 2019-07-07 LAB — BASIC METABOLIC PANEL
Anion gap: 7 (ref 5–15)
BUN: 14 mg/dL (ref 8–23)
CO2: 24 mmol/L (ref 22–32)
Calcium: 9.1 mg/dL (ref 8.9–10.3)
Chloride: 107 mmol/L (ref 98–111)
Creatinine, Ser: 0.82 mg/dL (ref 0.44–1.00)
GFR calc Af Amer: >60 mL/min (ref >60)
GFR calc non Af Amer: >60 mL/min (ref >60)
Glucose, Bld: 204 mg/dL — ABNORMAL HIGH (ref 70–99)
Potassium: 4.8 mmol/L (ref 3.5–5.1)
Sodium: 138 mmol/L (ref 135–145)

## 2019-07-07 LAB — HEMOGLOBIN A1C
Hgb A1c MFr Bld: 6.2 % — ABNORMAL HIGH (ref 4.8–5.6)
Mean Plasma Glucose: 131.24 mg/dL

## 2019-07-07 LAB — PROTIME-INR
INR: 2.7 — ABNORMAL HIGH (ref 0.8–1.2)
Prothrombin Time: 28.8 seconds — ABNORMAL HIGH (ref 11.4–15.2)

## 2019-07-07 LAB — GLUCOSE, CAPILLARY
Glucose-Capillary: 223 mg/dL — ABNORMAL HIGH (ref 70–99)
Glucose-Capillary: 206 mg/dL — ABNORMAL HIGH (ref 70–99)
Glucose-Capillary: 218 mg/dL — ABNORMAL HIGH (ref 70–99)

## 2019-07-07 MED ORDER — PREDNISONE 5 MG (21) PO TBPK: 10.0000 mg | ORAL_TABLET | Freq: Every evening | ORAL | Status: DC

## 2019-07-07 MED ORDER — PREDNISONE 5 MG (21) PO TBPK: 5.0000 mg | ORAL_TABLET | Freq: Four times a day (QID) | ORAL | Status: DC

## 2019-07-07 MED ORDER — PREDNISONE 5 MG (21) PO TBPK: 5.0000 mg | ORAL_TABLET | ORAL | Status: DC

## 2019-07-07 MED ORDER — PREDNISONE 5 MG (21) PO TBPK
10.0000 mg | ORAL_TABLET | Freq: Every morning | ORAL | Status: DC
  Filled 2019-07-07: qty 21

## 2019-07-07 MED ORDER — PREDNISONE 10 MG PO TABS
10.0000 mg | ORAL_TABLET | Freq: Every day | ORAL | Status: AC
  Administered 2019-07-07: 10 mg via ORAL
  Filled 2019-07-07: qty 1

## 2019-07-07 MED ORDER — PREDNISONE 10 MG PO TABS
5.0000 mg | ORAL_TABLET | Freq: Every day | ORAL | Status: AC
  Administered 2019-07-07: 5 mg via ORAL
  Filled 2019-07-07: qty 1

## 2019-07-07 MED ORDER — PREDNISONE 10 MG PO TABS
5.0000 mg | ORAL_TABLET | Freq: Once | ORAL | Status: AC
  Administered 2019-07-07: 5 mg via ORAL
  Filled 2019-07-07: qty 1

## 2019-07-07 MED ORDER — PREDNISONE 10 MG PO TABS
5.0000 mg | ORAL_TABLET | Freq: Three times a day (TID) | ORAL | Status: AC
  Administered 2019-07-08 (×3): 5 mg via ORAL
  Filled 2019-07-07 (×3): qty 1

## 2019-07-07 MED ORDER — PREDNISONE 5 MG (21) PO TBPK: 5.0000 mg | ORAL_TABLET | Freq: Three times a day (TID) | ORAL | Status: DC

## 2019-07-07 MED ORDER — INSULIN ASPART 100 UNIT/ML ~~LOC~~ SOLN
0.0000 [IU] | Freq: Three times a day (TID) | SUBCUTANEOUS | Status: DC
  Administered 2019-07-07 (×2): 5 [IU] via SUBCUTANEOUS
  Administered 2019-07-08: 3 [IU] via SUBCUTANEOUS
  Administered 2019-07-08: 2 [IU] via SUBCUTANEOUS
  Filled 2019-07-07 (×4): qty 1

## 2019-07-07 MED ORDER — PREDNISONE 10 MG PO TABS: 10.0000 mg | ORAL_TABLET | Freq: Every day | ORAL | Status: DC

## 2019-07-07 MED ORDER — WARFARIN SODIUM 2 MG PO TABS
4.0000 mg | ORAL_TABLET | Freq: Every day | ORAL | Status: DC
  Administered 2019-07-07: 4 mg via ORAL
  Filled 2019-07-07: qty 1
  Filled 2019-07-07: qty 2

## 2019-07-07 MED ORDER — PREDNISONE 10 MG PO TABS: 5.0000 mg | ORAL_TABLET | Freq: Four times a day (QID) | ORAL | Status: DC

## 2019-07-07 NOTE — Progress Notes (Signed)
Subjective: Patient states she is still feeling short of breath.  She also has severe dyspnea.  Patient does not use oxygen at home but currently needing 3 L of oxygen to maintain her saturation.  Having fatigue as well  Objective: Vital signs in last 24 hours: Temp:  [97.5 F (36.4 C)-98.7 F (37.1 C)] 97.5 F (36.4 C) (01/15 0849) Pulse Rate:  [84-116] 85 (01/15 0849) Resp:  [16-27] 19 (01/15 0849) BP: (101-147)/(53-88) 127/61 (01/15 0849) SpO2:  [88 %-97 %] 88 % (01/15 1000) Weight:  [134.5 kg-135.2 kg] 135.2 kg (01/15 0354)  Intake/Output from previous day: 01/14 0701 - 01/15 0700 In: -  Out: 300 [Urine:300] Intake/Output this shift: Total I/O In: 240 [P.O.:240] Out: -   Eyes: PERRL, lids and conjunctivae normal ENMT: Mucous membranes are moist.  Neck: normal, supple Respiratory: bibasilar crackles and mild wheezing scattered. Decrease aeration to left upper lobe. Normal respiratory effort at rest but short of breath in between sentences. No accessory muscle use.  Cardiovascular:  Irregular rhythm; 2/6 Systolic murmur but no rubs / gallops. No extremity edema.  Abdomen: no tenderness, no masses palpated.Bowel sounds positive.  Musculoskeletal: no clubbing / cyanosis. No joint deformity upper and lower extremities. Good ROM, no contractures. Normal muscle tone.  Skin: chronic venous stasis changes of bilateral pre-tibial region with erythema and scattered healing ulcers.  Neurologic: CN 2-12 grossly intact. Sensation intact. Strength 5/5 in all 4.  Psychiatric: Normal judgment and insight. Alert and oriented x 3. Normal mood.   Results for orders placed or performed during the hospital encounter of 07/06/19 (from the past 24 hour(s))  Basic metabolic panel     Status: Abnormal   Collection Time: 07/06/19  2:02 PM  Result Value Ref Range   Sodium 140 135 - 145 mmol/L   Potassium 4.2 3.5 - 5.1 mmol/L   Chloride 103 98 - 111 mmol/L   CO2 28 22 - 32 mmol/L   Glucose, Bld 120  (H) 70 - 99 mg/dL   BUN 15 8 - 23 mg/dL   Creatinine, Ser 1.03 (H) 0.44 - 1.00 mg/dL   Calcium 9.7 8.9 - 10.3 mg/dL   GFR calc non Af Amer 52 (L) >60 mL/min   GFR calc Af Amer >60 >60 mL/min   Anion gap 9 5 - 15  CBC     Status: Abnormal   Collection Time: 07/06/19  2:02 PM  Result Value Ref Range   WBC 9.2 4.0 - 10.5 K/uL   RBC 4.69 3.87 - 5.11 MIL/uL   Hemoglobin 13.6 12.0 - 15.0 g/dL   HCT 44.0 36.0 - 46.0 %   MCV 93.8 80.0 - 100.0 fL   MCH 29.0 26.0 - 34.0 pg   MCHC 30.9 30.0 - 36.0 g/dL   RDW 15.6 (H) 11.5 - 15.5 %   Platelets 221 150 - 400 K/uL   nRBC 0.0 0.0 - 0.2 %  Troponin I (High Sensitivity)     Status: None   Collection Time: 07/06/19  2:02 PM  Result Value Ref Range   Troponin I (High Sensitivity) 3 <18 ng/L  Brain natriuretic peptide     Status: Abnormal   Collection Time: 07/06/19  4:27 PM  Result Value Ref Range   B Natriuretic Peptide 218.0 (H) 0.0 - 100.0 pg/mL  Troponin I (High Sensitivity)     Status: None   Collection Time: 07/06/19  4:38 PM  Result Value Ref Range   Troponin I (High Sensitivity) <2 <18 ng/L  Protime-INR     Status: Abnormal   Collection Time: 07/06/19  4:38 PM  Result Value Ref Range   Prothrombin Time 27.9 (H) 11.4 - 15.2 seconds   INR 2.6 (H) 0.8 - 1.2  APTT     Status: Abnormal   Collection Time: 07/06/19  4:38 PM  Result Value Ref Range   aPTT 57 (H) 24 - 36 seconds  Respiratory Panel by RT PCR (Flu A&B, Covid) - Nasopharyngeal Swab     Status: None   Collection Time: 07/06/19  4:39 PM   Specimen: Nasopharyngeal Swab  Result Value Ref Range   SARS Coronavirus 2 by RT PCR NEGATIVE NEGATIVE   Influenza A by PCR NEGATIVE NEGATIVE   Influenza B by PCR NEGATIVE NEGATIVE  Basic metabolic panel     Status: Abnormal   Collection Time: 07/07/19  6:15 AM  Result Value Ref Range   Sodium 138 135 - 145 mmol/L   Potassium 4.8 3.5 - 5.1 mmol/L   Chloride 107 98 - 111 mmol/L   CO2 24 22 - 32 mmol/L   Glucose, Bld 204 (H) 70 - 99  mg/dL   BUN 14 8 - 23 mg/dL   Creatinine, Ser 0.82 0.44 - 1.00 mg/dL   Calcium 9.1 8.9 - 10.3 mg/dL   GFR calc non Af Amer >60 >60 mL/min   GFR calc Af Amer >60 >60 mL/min   Anion gap 7 5 - 15  CBC     Status: None   Collection Time: 07/07/19  6:15 AM  Result Value Ref Range   WBC 9.7 4.0 - 10.5 K/uL   RBC 4.32 3.87 - 5.11 MIL/uL   Hemoglobin 12.7 12.0 - 15.0 g/dL   HCT 40.2 36.0 - 46.0 %   MCV 93.1 80.0 - 100.0 fL   MCH 29.4 26.0 - 34.0 pg   MCHC 31.6 30.0 - 36.0 g/dL   RDW 15.3 11.5 - 15.5 %   Platelets 192 150 - 400 K/uL   nRBC 0.0 0.0 - 0.2 %  Protime-INR     Status: Abnormal   Collection Time: 07/07/19  6:15 AM  Result Value Ref Range   Prothrombin Time 28.8 (H) 11.4 - 15.2 seconds   INR 2.7 (H) 0.8 - 1.2  Magnesium     Status: Abnormal   Collection Time: 07/07/19  6:15 AM  Result Value Ref Range   Magnesium 2.5 (H) 1.7 - 2.4 mg/dL  Hemoglobin A1c     Status: Abnormal   Collection Time: 07/07/19  6:15 AM  Result Value Ref Range   Hgb A1c MFr Bld 6.2 (H) 4.8 - 5.6 %   Mean Plasma Glucose 131.24 mg/dL  Glucose, capillary     Status: Abnormal   Collection Time: 07/07/19 11:44 AM  Result Value Ref Range   Glucose-Capillary 223 (H) 70 - 99 mg/dL    Studies/Results: DG Chest 2 View  Result Date: 07/06/2019 CLINICAL DATA:  Chest pain, shortness of breath EXAM: CHEST - 2 VIEW COMPARISON:  05/07/2019 FINDINGS: Stable cardiomediastinal contours. Low lung volumes. Prominent interstitial markings throughout both lungs, similar to prior. Bandlike opacity at the left lung base, not significantly changed from prior. No new focal airspace consolidation. No pleural effusion. No pneumothorax. Saturated thoracic kyphosis. No acute osseous findings. IMPRESSION: 1. Chronic bronchitic type lung changes. 2. Linear scarring versus atelectasis in the left lung base, similar to prior. Electronically Signed   By: Davina Poke D.O.   On: 07/06/2019 14:17   CT Angio  Chest PE W and/or Wo  Contrast  Result Date: 07/06/2019 CLINICAL DATA:  Shortness of breath EXAM: CT ANGIOGRAPHY CHEST WITH CONTRAST TECHNIQUE: Multidetector CT imaging of the chest was performed using the standard protocol during bolus administration of intravenous contrast. Multiplanar CT image reconstructions and MIPs were obtained to evaluate the vascular anatomy. CONTRAST:  116mL OMNIPAQUE IOHEXOL 350 MG/ML SOLN COMPARISON:  Chest x-ray 07/06/2019, CT chest 12/29/2018 FINDINGS: Cardiovascular: Satisfactory opacification of the pulmonary arteries to the segmental level. Respiratory motion limits evaluation for subsegmental PE in the lower lobes. No acute embolus is identified. Nonaneurysmal aorta. Mild aortic atherosclerosis. Cardiomegaly. No pericardial effusion. Mediastinum/Nodes: Midline trachea. Narrowed/partially collapsed appearance of the distal trachea bronchi. Central airways thickening. Esophagus normal. No thyroid mass. No significant adenopathy Lungs/Pleura: No pleural effusion. Partial atelectasis at the bases. Mild mosaic pattern with scattered hazy pulmonary density. Upper Abdomen: No acute abnormality. Musculoskeletal: Degenerative changes. No acute or suspicious osseous abnormality Review of the MIP images confirms the above findings. IMPRESSION: 1. Slightly limited exam secondary to respiratory motion degradation. No definite acute pulmonary embolus is visualized. 2. Cardiomegaly 3. Slightly narrowed/collapsed appearance of the distal trachea and bronchi suggesting tracheobronchomalacia 4. Central airways thickening could reflect bronchitis. Patchy foci of hazy lung density, possibly due to small airways disease. Aortic Atherosclerosis (ICD10-I70.0). Electronically Signed   By: Donavan Foil M.D.   On: 07/06/2019 17:53    Scheduled Meds: . calcium carbonate  1 tablet Oral BID  . cholecalciferol  2,000 Units Oral Daily  . diltiazem  300 mg Oral Daily  . ezetimibe  10 mg Oral Daily  . feeding supplement  (ENSURE ENLIVE)  237 mL Oral BID BM  . insulin aspart  0-15 Units Subcutaneous TID WC  . ipratropium  0.5 mg Nebulization Q6H  . levalbuterol  1.25 mg Nebulization Q6H  . loratadine  10 mg Oral Daily  . magnesium oxide  800 mg Oral QPM  . metoprolol tartrate  50 mg Oral BID  . montelukast  10 mg Oral QHS  . potassium chloride  20 mEq Oral BID  . predniSONE  10 mg Oral QHS   Followed by  . [START ON 07/08/2019] predniSONE  5 mg Oral TID WC   Followed by  . [START ON 07/08/2019] predniSONE  10 mg Oral QHS   Followed by  . [START ON 07/09/2019] predniSONE  5 mg Oral QID  . predniSONE  5 mg Oral QPC supper  . vitamin B-12  3,000 mcg Oral Daily  . warfarin  4 mg Oral q1800  . Warfarin - Pharmacist Dosing Inpatient   Does not apply q1800   Continuous Infusions: . sodium chloride Stopped (07/07/19 0100)   PRN Meds:sodium chloride, morphine injection  Assessment/Plan:  Acute hypoxic respiratory failure secondary to bronchitis/tracheobrochomalacia /asthma exacerbation -Currently requiring 3 L at rest -Status post Solu-Medrol on admission -Started prednisone taper dose Unsure if possible triggered by recent COVID vaccine Continue ipratropium levalbuterol q6hr scheduled  Status post IV magnesium  Maintain O2 >92% -Home O2 ordered Continue singular and loratadine   AKI --resolved creatinine of 1.03 on admission Status post IV fluid bolus on admission avoid nephrotoxic agents  Chronic diastolic heart failure  Appears euvolemic on exam Continue metoprolol   Persistent atrial fibrillation --currently rate controlled below 100 Intermittently near 110 at times  Monitor closely on telemetry  continue warfarin check daily INR  Continue diltiazem   Hypertension stable  HLD Continue zetia   OSA CPAP  at night  Prediabetes: A1c  on this admission found to be 6.2 -Blood glucose ranging between 200-2 50s -Also started patient on prednisone after Solu-Medrol -We will start  patient on sliding scale insulin -Discharged with oral agent  DVT prophylaxis: On warfarin Code Status:FULL  Family Communication: Plan discussed with patient at bedside admission disposition Plan: Home with at least 2 midnight stays  -PT OT eval and follow DC rec   LOS: 1 day   Laura Gonzalez [place]

## 2019-07-07 NOTE — Discharge Instructions (Signed)
Continue outpatient physical therapy.  
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
## 2019-07-07 NOTE — Plan of Care (Signed)
Oxygen at  [facility] on arrival to floor.  Report shortness of breath with minimal exertion.  Reds clip not completed due to BMI.

## 2019-07-07 NOTE — Evaluation (Signed)
Physical Therapy Evaluation Patient Details Name: Laura Gonzalez MRN: [id number] DOB: Feb 10, 1941 Today's Date: 07/07/2019   History of Present Illness  presented to ER secondary to SOB; admitted for acute/chronic respiratory failure secondary to bronchitis, asthma, tracheobronchomalacia.  Clinical Impression  Upon evaluation, patient alert and oriented; follows commands and oriented to basic information.  Generally hyperverbal, requiring intermittent redirection to task at hand.  Bilat UE/LE strength and ROM grossly symmetrical and WFL; no focal weakness, sensory deficit appreciated.  Able to complete bed mobility with mod indep; sit/stand, basic transfers and gait (50') with RW, close sup.  Demonstrates reciprocal stepping pattern, broad BOS with excessive lateral sway; slow and cautious. Negotiates turns and stationary obstacles without difficulty. HR elevation peak to 168 during last 20' of gait, sats 89% on 3L; return to baseline 90-100s HR and 92-93% within 15-20 seconds of seated rest.  RN informed/aware.  Additional distance/activity limited as result. Would benefit from skilled PT to address above deficits and promote optimal return to PLOF.; Recommend transition to HHPT upon discharge from acute hospitalization.     Follow Up Recommendations Home health PT    Equipment Recommendations       Recommendations for Other Services       Precautions / Restrictions Precautions Precautions: Fall Restrictions Weight Bearing Restrictions: No      Mobility  Bed Mobility Overal bed mobility: Modified Independent                Transfers Overall transfer level: Needs assistance Equipment used: Rolling walker (2 wheeled) Transfers: Sit to/from Stand Sit to Stand: Supervision         General transfer comment: mildly elevated bed surface, increased use of momentum for lift off  Ambulation/Gait Ambulation/Gait assistance: Supervision Gait Distance (Feet): 50 Feet Assistive  device: Rolling walker (2 wheeled)       General Gait Details: reciprocal stepping pattern, broad BOS with excessive lateral sway; slow and cautious. Negotiates turns and stationary obstacles without difficulty. HR elevation peak to 168 during last 20' of gait, sats 89% on 3L; return to baseline 90-100s HR and 92-93% within 15-20 seconds of seated rest  Stairs            Wheelchair Mobility    Modified Rankin (Stroke Patients Only)       Balance Overall balance assessment: Needs assistance Sitting-balance support: No upper extremity supported;Feet supported Sitting balance-Leahy Scale: Good     Standing balance support: Bilateral upper extremity supported Standing balance-Leahy Scale: Fair                               Pertinent Vitals/Pain Pain Assessment: No/denies pain    Home Living Family/patient expects to be discharged to:: Private residence Living Arrangements: Alone   Type of Home: Apartment       Home Layout: One level Home Equipment: Clinical cytogeneticist - 4 wheels      Prior Function Level of Independence: Independent with assistive device(s)         Comments: Mod indep wiht 4WRW for ADLs, household and limited community mobilization; no home O2. Facility brings meals to room currently, but normally has access to dining room.     Hand Dominance        Extremity/Trunk Assessment   Upper Extremity Assessment Upper Extremity Assessment: Overall WFL for tasks assessed    Lower Extremity Assessment Lower Extremity Assessment: Overall WFL for tasks assessed  Communication   Communication: No difficulties  Cognition Arousal/Alertness: Awake/alert Behavior During Therapy: WFL for tasks assessed/performed Overall Cognitive Status: Within Functional Limits for tasks assessed                                        General Comments      Exercises Other Exercises Other Exercises: Sit/stand from elevated  bed surface with RW, close sup-mild use of momentum for lift off and completion of tranfser; broad BOS Other Exercises: Toilet transfer, SPT without assist device, cga/close sup; does require UE support for optimal safety/balance.   Assessment/Plan    PT Assessment Patient needs continued PT services  PT Problem List Decreased strength;Decreased range of motion;Decreased activity tolerance;Decreased balance;Decreased mobility;Decreased coordination;Decreased cognition;Decreased knowledge of use of DME;Decreased safety awareness;Decreased knowledge of precautions;Cardiopulmonary status limiting activity;Impaired sensation;Obesity       PT Treatment Interventions DME instruction;Gait training;Functional mobility training;Therapeutic activities;Therapeutic exercise;Balance training;Patient/family education    PT Goals (Current goals can be found in the Care Plan section)  Acute Rehab PT Goals Patient Stated Goal: to return home when the doctor says i can PT Goal Formulation: With patient Time For Goal Achievement: 07/21/19 Potential to Achieve Goals: Good    Frequency Min 2X/week   Barriers to discharge        Co-evaluation               AM-PAC PT "6 Clicks" Mobility  Outcome Measure Help needed turning from your back to your side while in a flat bed without using bedrails?: None Help needed moving from lying on your back to sitting on the side of a flat bed without using bedrails?: None Help needed moving to and from a bed to a chair (including a wheelchair)?: None Help needed standing up from a chair using your arms (e.g., wheelchair or bedside chair)?: None Help needed to walk in hospital room?: None Help needed climbing 3-5 steps with a railing? : A Little 6 Click Score: 23    End of Session Equipment Utilized During Treatment: Gait belt Activity Tolerance: Patient tolerated treatment well Patient left: in bed;with call bell/phone within reach;with bed alarm set Nurse  Communication: Mobility status PT Visit Diagnosis: Difficulty in walking, not elsewhere classified (R26.2);Muscle weakness (generalized) (M62.81)    Time: 1513-1600 PT Time Calculation (min) (ACUTE ONLY): 47 min   Charges:   PT Evaluation $PT Eval Moderate Complexity: 1 Mod PT Treatments $Therapeutic Activity: 23-37 mins        Renessa Wellnitz H. Owens Shark, PT, DPT, NCS 07/07/19, 5:04 PM [phone]

## 2019-07-07 NOTE — Progress Notes (Signed)
Patient at rest in bed.  Oxygen level checked on room air while in bed and found to be 88%.  Placed back on 3L [place]. Dr. Izetta Dakin made aware.

## 2019-07-08 LAB — GLUCOSE, CAPILLARY
Glucose-Capillary: 141 mg/dL — ABNORMAL HIGH (ref 70–99)
Glucose-Capillary: 156 mg/dL — ABNORMAL HIGH (ref 70–99)

## 2019-07-08 LAB — PROTIME-INR
INR: 3.7 — ABNORMAL HIGH (ref 0.8–1.2)
Prothrombin Time: 36.7 seconds — ABNORMAL HIGH (ref 11.4–15.2)

## 2019-07-08 MED ORDER — PREDNISONE 5 MG PO TABS: 5.0000 mg | ORAL_TABLET | Freq: Four times a day (QID) | ORAL | 0 refills | Status: DC

## 2019-07-08 MED ORDER — DILTIAZEM HCL 30 MG PO TABS: 30.0000 mg | ORAL_TABLET | Freq: Once | ORAL | Status: DC

## 2019-07-08 MED ORDER — IPRATROPIUM BROMIDE 0.02 % IN SOLN: 0.5000 mg | Freq: Three times a day (TID) | RESPIRATORY_TRACT | 1 refills | Status: DC

## 2019-07-08 MED ORDER — LEVALBUTEROL HCL 0.63 MG/3ML IN NEBU: 1.2500 mg | INHALATION_SOLUTION | Freq: Three times a day (TID) | RESPIRATORY_TRACT | 1 refills | Status: DC

## 2019-07-08 MED ORDER — PREDNISONE 5 MG PO TABS: 5.0000 mg | ORAL_TABLET | Freq: Three times a day (TID) | ORAL | 0 refills | Status: DC

## 2019-07-08 MED ORDER — IPRATROPIUM BROMIDE 0.02 % IN SOLN
0.5000 mg | Freq: Three times a day (TID) | RESPIRATORY_TRACT | Status: DC
  Administered 2019-07-08 (×2): 0.5 mg via RESPIRATORY_TRACT
  Filled 2019-07-08 (×2): qty 2.5

## 2019-07-08 MED ORDER — PREDNISONE 10 MG PO TABS: 10.0000 mg | ORAL_TABLET | Freq: Every day | ORAL | 0 refills | Status: DC

## 2019-07-08 MED ORDER — LEVALBUTEROL HCL 0.63 MG/3ML IN NEBU
1.2500 mg | INHALATION_SOLUTION | Freq: Three times a day (TID) | RESPIRATORY_TRACT | Status: DC
  Administered 2019-07-08 (×2): 1.25 mg via RESPIRATORY_TRACT
  Filled 2019-07-08: qty 6

## 2019-07-08 MED ORDER — METOPROLOL TARTRATE 50 MG PO TABS: 50.0000 mg | ORAL_TABLET | Freq: Two times a day (BID) | ORAL | 1 refills | Status: DC

## 2019-07-08 MED ORDER — ENSURE ENLIVE PO LIQD: 237.0000 mL | Freq: Two times a day (BID) | ORAL | 11 refills | Status: DC

## 2019-07-08 MED ORDER — DILTIAZEM HCL ER COATED BEADS 300 MG PO CP24: 300.0000 mg | ORAL_CAPSULE | Freq: Every day | ORAL | 0 refills | Status: DC

## 2019-07-08 NOTE — Progress Notes (Signed)
ANTICOAGULATION CONSULT NOTE  Pharmacy Consult for Warfarin Indication: atrial fibrillation  Patient Measurements: Height: 5\' 5"  (165.1 cm) Weight: 289 lb 14.4 oz (131.5 kg) IBW/kg (Calculated) : 57    Labs: Recent Labs    07/06/19 1402 07/06/19 1638 07/07/19 0615 07/08/19 0551  HGB 13.6  --  12.7  --   HCT 44.0  --  40.2  --   PLT 221  --  192  --   APTT  --  57*  --   --   LABPROT  --  27.9* 28.8* 36.7*  INR  --  2.6* 2.7* 3.7*  CREATININE 1.03*  --  0.82  --   TROPONINIHS 3 <2  --   --     Estimated Creatinine Clearance: 78.7 mL/min (by C-G formula based on SCr of 0.82 mg/dL).   Scheduled:  . calcium carbonate  1 tablet Oral BID  . cholecalciferol  2,000 Units Oral Daily  . diltiazem  300 mg Oral Daily  . ezetimibe  10 mg Oral Daily  . feeding supplement (ENSURE ENLIVE)  237 mL Oral BID BM  . insulin aspart  0-15 Units Subcutaneous TID WC  . ipratropium  0.5 mg Nebulization TID  . levalbuterol  1.25 mg Nebulization TID  . loratadine  10 mg Oral Daily  . magnesium oxide  800 mg Oral QPM  . metoprolol tartrate  50 mg Oral BID  . montelukast  10 mg Oral QHS  . potassium chloride  20 mEq Oral BID  . predniSONE  5 mg Oral TID WC   Followed by  . predniSONE  10 mg Oral QHS   Followed by  . [START ON 07/09/2019] predniSONE  5 mg Oral QID  . vitamin B-12  3,000 mcg Oral Daily  . warfarin  4 mg Oral q1800  . Warfarin - Pharmacist Dosing Inpatient   Does not apply q1800   Infusions:  . sodium chloride Stopped (07/07/19 0100)    Assessment: 79 yo F on Warfarin PTA for Afib and resuming. Pt w/ chills, SOB (Covid vaccine on Tues) Home warfarin dose = 4 mg daily  Date:    INR:     Dose: 1/14      2.6        4 mg 1/15      2.7        4 mg  1/16      3.7        HOLD   Goal of Therapy:  INR 2-3 Monitor platelets by anticoagulation protocol: Yes   Plan:  Will hold today's dose of warfarin.  F/U INR in am and reassess dosing. DDI: none  currently  Jesus Poplin K, RPH 07/08/2019,7:47 AM

## 2019-07-08 NOTE — Evaluation (Signed)
Occupational Therapy Evaluation Patient Details Name: Laura Gonzalez MRN: [id number] DOB: [birth year] Today's Date: 07/08/2019    History of Present Illness presented to ER secondary to SOB; admitted for acute/chronic respiratory failure secondary to bronchitis, asthma, tracheobronchomalacia.   Clinical Impression   Patient seen for OT evaluation, patient lives alone at the [facility] at [place] in an independent apartment.  She has cleaning service every other week, access to 2 meals a day.  She was independent prior to admission with all self care and IADL tasks including driving.  Patient presents with muscle weakness, decreased activity tolerance, she is on 3L of O2 now at the hospital but was not on oxygen at home.  She would benefit from skilled OT services to maximize safety and independence in daily tasks.      Follow Up Recommendations  Home health OT    Equipment Recommendations       Recommendations for Other Services       Precautions / Restrictions Precautions Precautions: Fall Restrictions Weight Bearing Restrictions: No      Mobility Bed Mobility Overal bed mobility: Modified Independent                Transfers Overall transfer level: Needs assistance Equipment used: Rolling walker (2 wheeled) Transfers: Sit to/from Stand Sit to Stand: Supervision              Balance Overall balance assessment: Needs assistance Sitting-balance support: No upper extremity supported;Feet supported Sitting balance-Leahy Scale: Good     Standing balance support: Bilateral upper extremity supported Standing balance-Leahy Scale: Fair                             ADL either performed or assessed with clinical judgement   ADL Overall ADL's : Needs assistance/impaired Eating/Feeding: Modified independent   Grooming: Modified independent   Upper Body Bathing: Modified independent   Lower Body Bathing: Minimal assistance   Upper Body Dressing :  Modified independent   Lower Body Dressing: Minimal assistance   Toilet Transfer: Min guard   Toileting- Clothing Manipulation and Hygiene: Min guard       Functional mobility during ADLs: Supervision/safety       Vision Baseline Vision/History: Wears glasses Wears Glasses: At all times       Perception     Praxis      Pertinent Vitals/Pain Pain Assessment: No/denies pain     Hand Dominance Right   Extremity/Trunk Assessment Upper Extremity Assessment Upper Extremity Assessment: Generalized weakness;Overall [facility] for tasks assessed   Lower Extremity Assessment Lower Extremity Assessment: Defer to PT evaluation       Communication Communication Communication: No difficulties   Cognition Arousal/Alertness: Awake/alert Behavior During Therapy: WFL for tasks assessed/performed Overall Cognitive Status: Within Functional Limits for tasks assessed                                     General Comments       Exercises     Shoulder Instructions      Home Living Family/patient expects to be discharged to:: Private residence Living Arrangements: Alone Available Help at Discharge: Friend(s);Neighbor Type of Home: Apartment Home Access: Elevator     Home Layout: One level     Bathroom Shower/Tub: Walk-in shower;Door   [employer] Toilet: Standard     Home Equipment: Clinical cytogeneticist - 4 wheels;Grab bars -  tub/shower          Prior Functioning/Environment Level of Independence: Independent with assistive device(s)        Comments: Mod indep wiht 4WRW for ADLs, household and limited community mobilization; no home O2. Facility brings meals to room currently, but normally has access to dining room.        OT Problem List: Decreased strength;Decreased activity tolerance;Impaired balance (sitting and/or standing)      OT Treatment/Interventions: Self-care/ADL training;Therapeutic exercise;Patient/family education;Balance training;Energy  conservation;Therapeutic activities;DME and/or AE instruction    OT Goals(Current goals can be found in the care plan section) Acute Rehab OT Goals Patient Stated Goal: to go back to the [facility] [place] and be as independent as possible OT Goal Formulation: With patient Time For Goal Achievement: 07/15/19 Potential to Achieve Goals: Good ADL Goals Pt Will Perform Lower Body Dressing: with modified independence Pt Will Transfer to Toilet: with modified independence  OT Frequency: Min 1X/week   Barriers to D/C:            Co-evaluation              AM-PAC OT "6 Clicks" Daily Activity     Outcome Measure Help from another person eating meals?: None Help from another person taking care of personal grooming?: None Help from another person toileting, which includes using toliet, bedpan, or urinal?: A Little Help from another person bathing (including washing, rinsing, drying)?: A Little Help from another person to put on and taking off regular upper body clothing?: None Help from another person to put on and taking off regular lower body clothing?: A Little 6 Click Score: 21   End of Session Equipment Utilized During Treatment: Oxygen;Gait belt  Activity Tolerance: Patient tolerated treatment well Patient left: in bed;with call bell/phone within reach  OT Visit Diagnosis: Muscle weakness (generalized) (M62.81)                Time: [id number] OT Time Calculation (min): 36 min Charges:  OT General Charges $OT Visit: 1 Visit OT Evaluation $OT Eval Low Complexity: 1 Low  Kenzlee Fishburn T Fatiha Guzy, OTR/L, CLT   Alyzabeth Pontillo 07/08/2019, 3:28 PM

## 2019-07-08 NOTE — Progress Notes (Signed)
Patient discharged to home.  Patient to go with home oxygen.  Discharged home with EMS.  Belongings sent with patient

## 2019-07-08 NOTE — TOC Transition Note (Signed)
Transition of Care [facility]) - CM/SW Discharge Note   Patient Details  Name: Laura Gonzalez MRN: [id number] Date of Birth: 1941-01-29  Transition of Care [facility]) CM/SW Contact:  Ross Ludwig, LCSW Phone Number: 07/08/2019, 4:09 PM   Clinical Narrative:     CSW spoke to patient she is from [facility] [place].  Patient is currently receiving home health through [facility] [place] home health agency.  Patient is a new oxygen patient, oxygen was delivered yesterday by [employer].  Patient stated that she does not have a ride home, and needs oxygen, she is requesting EMS transport.  Patient does not express any other needs, patient will be discharging back to [place] today.  Final next level of care: [facility]) Barriers to Discharge: Barriers Resolved   Patient Goals and CMS Choice Patient states their goals for this hospitalization and ongoing recovery are:: To return back home with Independent Living CMS Medicare.gov Compare Post Acute Care list provided to:: Patient Choice offered to / list presented to : Patient  Discharge Placement  Patient returning to [place].                     Discharge Plan and Services                DME Arranged: Oxygen DME Agency: [employer] Date DME Agency Contacted: 07/08/19 Time DME Agency Contacted: 15 Representative spoke with at DME Agency: [place]: PT, OT, RN [facility] Agency: Other - See comment([facility] [place] has their own [place] agency) Date [place]: 07/08/19 Time [place]: 1300 Representative spoke with at [place]: Maudie Mercury at [employer] of [place] ([place]) Interventions     Readmission Risk Interventions No flowsheet data found.

## 2019-07-08 NOTE — Clinical Social Work Note (Signed)
Patient is from [place] at [facility] [place]. Laurence Folz, MSW, LCSW [phone]  07/08/2019 1:22 PM

## 2019-07-08 NOTE — Discharge Summary (Signed)
Physician Discharge Summary  Patient ID: Laura Gonzalez MRN: [id number] DOB/AGE: July 17, 1940 79 y.o.  Admit date: 07/06/2019 Discharge date: 07/08/2019  Admission Diagnoses:  Discharge Diagnoses:  Principal Problem:   Acute respiratory failure with hypoxia ([place]) Active Problems:   Persistent atrial fibrillation (H[facility])   HTN (hypertension)   Chronic diastolic heart failure ([place])   Discharged Condition: fair  Hospital Course:  Laura Gonzalez is a 79 y.o. female with medical history significant of chronic diastolic heart failure, atrial fibrillation on Warfarin, asthma, OSA, hypertension, PAD, and GERD who presents for concerns of increasing shortness of breath. Pt received her COVID vaccine 2 days ago. The following day she began to have headache and chills. Had temperature of 100.3.   On admission, noted her atrial fibrillation had a rate of 143. Felt sore throat and generalize malaise. Felt her lymph nodes were swollen behind her ears because they were sore. Denies tobacco, alcohol or illicit drug use.  Acute hypoxic respiratory failure secondary to bronchitis/tracheobrochomalacia /asthma exacerbation -Much improved compared to admission time Unsure if possible triggered by recent COVID vaccine Was started on ipratropium levalbuterol q6hr scheduled; Rx given -Patient is needing 3 L of supplemental oxygen to maintain saturation.  Patient will go back to the facility with supplemental oxygen which is delivered to her room before discharge Continue singular and loratadine  -Patient was started on Solu-Medrol and received 2 doses; patient was discharged home with tapered prednisone -Given direction how to use it -Patient was strongly advised to follow-up with PCP, and also requested discharge follow-up with pulmonologist with [facility] health.  AKI -resolved avoid nephrotoxic agents  Chronic diastolic heart failure  Appears euvolemic on exam Continue metoprolol   Persistent atrial  fibrillation  Patient's heart rate is been fluctuating  -At rest is mostly controlled below or around low 100s  -However with exertion heart rate sometimes goes up to 130s to 150s -This appears to be a chronic issue for her -At the time of discharge patient heart rate came down to 80s. -Patient was strongly advised to continue to take her home diltiazem as directed by her cardiologist Dr. Ubaldo Glassing.  Patient has scheduled Cardizem at home as well as as needed Cardizem for elevated heart rate. - continue warfarin.  To be noted patient's Coumadin found to be 3.7 on the day of discharge.  He was strongly advised to patient that she checks her INR by this coming Monday, January 18.  In the meantime patient was advised not to take any Coumadin on 07/08/2019.  Also to take 2 mg of Coumadin on 07/09/2019.  And check INR on 07/10/2019.  See PCP either on 1/18 or 07/11/2019 and take Coumadin based on PCP direction.  If she is unable to see PCP on 07/10/19 patient was advised to take 4 mg of INR on 07/10/19 and on and see PCP as soon as possible.  - Continue diltiazem   Hypertension stable  OSA CPAP   Consults: None  Significant Diagnostic Studies: Radiologic imaging and lab work  Treatments: Discharge med list and hospital course  Discharge Exam: Blood pressure 121/61, pulse 94, temperature 97.7 F (36.5 C), temperature source Oral, resp. rate 18, height 5\' 5"  (1.651 m), weight 131.5 kg, SpO2 96 %.  Eyes:PERRL, lids and conjunctivae normal ENMT:Mucous membranes are moist.  Neck:normal, supple Respiratory:Coarse breathing, Decrease aeration to left upper lobe.Normal respiratory effort; No accessory muscle use.  Cardiovascular: Irregular [id number] Systolic murmur but no rubs / gallops. No extremity edema.  Abdomen:no tenderness,  no masses palpated.Bowel sounds positive.  Musculoskeletal:no clubbing / cyanosis. No joint deformity upper and lower extremities. Good ROM, no contractures. Normal  muscle tone.  Skin:chronic venous stasis changes of bilateral pre-tibial region with erythema and scattered healing ulcers. Neurologic:CN 2-12 grossly intact. Sensation intact.Strength 5/5 in all 4.  Psychiatric:Normal judgment and insight. Alert and oriented x 3. Normal mood.   Disposition: Discharge disposition: 06-Home-Health Care Svc     Patient is stable to be discharged home with close follow-up with PCP, cardiologist and pulmonologist.  Patient was also advised to check INR and Coumadin dose direction as explained above.  Warning signs and symptoms explained to patient when she must seek immediate medical attention.  Patient expressed understanding  Discharge Instructions    Call MD for:  difficulty breathing, headache or visual disturbances   Complete by: As directed    Call MD for:  extreme fatigue   Complete by: As directed    Diet - low sodium heart healthy   Complete by: As directed    Walk with assistance   Complete by: As directed    As tolerated only     Allergies as of 07/08/2019      Reactions   Amoxicillin Other (See Comments)   Lips swelling, tingling Has patient had a PCN reaction causing immediate rash, facial/tongue/throat swelling, SOB or lightheadedness with hypotension: Yes Has patient had a PCN reaction causing severe rash involving mucus membranes or skin necrosis: No Has patient had a PCN reaction that required hospitalization: No Has patient had a PCN reaction occurring within the last 10 years: No If all of the above answers are "NO", then may proceed with Cephalosporin use.   Benzocaine-menthol    Throat swelling   Chloraseptic Sore Throat [acetaminophen] Other (See Comments)   throat swelling   Biafine [wound Dressings] Swelling   Chlorpheniramine    Throat swelling   Neosporin [neomycin-bacitracin Zn-polymyx] Other (See Comments)   Skin redness, puffy   Shellfish Allergy Swelling   "lips swell"   Statins Other (See Comments)   Muscle  weakness, weak   Tape Other (See Comments)   Skin redness      Medication List    STOP taking these medications   albuterol (2.5 MG/3ML) 0.083% nebulizer solution Commonly known as: PROVENTIL   albuterol 108 (90 Base) MCG/ACT inhaler Commonly known as: VENTOLIN HFA     TAKE these medications   acetaminophen 500 MG tablet Commonly known as: TYLENOL Take 1,000 mg by mouth every 6 (six) hours as needed.   alendronate 70 MG tablet Commonly known as: FOSAMAX Take 70 mg by mouth every Sunday. Take with a full glass of water on an empty stomach. On Sundays   aspirin 81 MG chewable tablet Chew 81 mg by mouth daily as needed.   AYR SALINE NASAL GEL NA Place 1 application into the nose as needed (congestion).   B-12 3000 MCG Caps Take 3,000 mcg by mouth daily.   calcium carbonate 500 MG chewable tablet Commonly known as: TUMS - dosed in mg elemental calcium Chew 1 tablet (200 mg of elemental calcium total) by mouth 2 (two) times daily.   CITRACAL PO Take 1 tablet by mouth 2 (two) times daily.   CoQ-10 100 MG capsule Take 200 mg by mouth daily.   diltiazem 300 MG 24 hr capsule Commonly known as: CARDIZEM CD Take 1 capsule (300 mg total) by mouth daily.   diltiazem 60 MG tablet Commonly known as: CARDIZEM Take 1 tablet (  60 mg total) by mouth daily as needed (AFIB).   diphenhydrAMINE 25 mg capsule Commonly known as: BENADRYL Take 25 mg by mouth every 6 (six) hours as needed.   ezetimibe 10 MG tablet Commonly known as: ZETIA Take 10 mg by mouth daily.   feeding supplement (ENSURE ENLIVE) Liqd Take 237 mLs by mouth 2 (two) times daily between meals.   fluticasone 50 MCG/ACT nasal spray Commonly known as: FLONASE Place 1 spray into both nostrils daily as needed for allergies.   furosemide 40 MG tablet Commonly known as: LASIX Take 1 tablet (40 mg total) by mouth daily as needed.   ipratropium 0.02 % nebulizer solution Commonly known as: ATROVENT Take 2.5 mLs  (0.5 mg total) by nebulization 3 (three) times daily.   levalbuterol 0.63 MG/3ML nebulizer solution Commonly known as: XOPENEX Take 5.9524 mLs (1.25 mg total) by nebulization 3 (three) times daily.   loratadine 10 MG tablet Commonly known as: CLARITIN Take 10 mg by mouth daily.   magnesium oxide 400 MG tablet Commonly known as: MAG-OX Take 800 mg by mouth every evening.   MegaRed Omega-3 Krill Oil 500 MG Caps Take 500 mg by mouth every morning.   metoprolol tartrate 50 MG tablet Commonly known as: LOPRESSOR Take 1 tablet (50 mg total) by mouth 2 (two) times daily.   montelukast 10 MG tablet Commonly known as: SINGULAIR Take 10 mg by mouth at bedtime.   ONE-A-DAY WOMENS PO Take 1 tablet by mouth 2 (two) times daily.   polyethylene glycol 17 g packet Commonly known as: MIRALAX / GLYCOLAX Take 17 g by mouth daily as needed for mild constipation.   potassium chloride 20 MEQ packet Commonly known as: KLOR-CON Take 20 mEq by mouth 2 (two) times daily.   predniSONE 5 MG tablet Commonly known as: DELTASONE Take 1 tablet (5 mg total) by mouth 3 (three) times daily with meals.   predniSONE 10 MG tablet Commonly known as: DELTASONE Take 1 tablet (10 mg total) by mouth at bedtime.   predniSONE 5 MG tablet Commonly known as: DELTASONE Take 1 tablet (5 mg total) by mouth 4 (four) times daily. Start taking on: July 09, 2019   Probiotic Caps Take 1 capsule by mouth daily.   SYSTANE OP Place 1 drop into both eyes at bedtime as needed (allergies).   trolamine salicylate 10 % cream Commonly known as: ASPERCREME Apply 1 application topically 3 (three) times daily as needed (knee pain).   Vitamin D-3 125 MCG (5000 UT) Tabs Take 2,000 Units by mouth daily.   warfarin 4 MG tablet Commonly known as: COUMADIN Take 4 mg by mouth every evening.            Durable Medical Equipment  (From admission, onward)         Start     Ordered   07/07/19 1329  For home use  only DME oxygen  Once    Question Answer Comment  Length of Need 12 Months   Mode or (Route) Nasal cannula   Liters per Minute 3   Frequency Continuous (stationary and portable oxygen unit needed)   Oxygen delivery system Gas      07/07/19 1329         Follow-up Information    Baxter Hire, MD. Call in 3 day(s).   Specialty: Internal Medicine Why: Call PCP and check INR in 1 to 2 days.  Also see PCP for further Coumadin dose adjustment and follow-up. Contact information: [place]  [place] [address] [phone]        Ottie Glazier, MD. Schedule an appointment as soon as possible for a visit in 2 week(s).   Specialty: Pulmonary Disease Why: Chronic lung disease. Contact information: [place] [address] [phone]        Teodoro Spray, MD. Schedule an appointment as soon as possible for a visit in 5 day(s).   Specialty: Cardiology Why: Please see your cardiologist Dr. Ubaldo Glassing in 5 to 10 days for A. fib follow-up Contact information: [place] [address] [phone]           Signed: Thornell Mule 07/08/2019, 1:28 PM

## 2019-07-08 NOTE — Progress Notes (Signed)
Patient ambulated in hall with wheeled walker and staff on 3L (she will be going home with oxygen).  With ambulation, noted to have some DOE and heart rate highest at 150.  When she would stop and rest, heart rate would go down to low 100s.  Patient continues to be in afib.  Dr. Izetta Dakin made aware

## 2020-02-18 ENCOUNTER — Emergency Department: Payer: Medicare Other

## 2020-02-18 ENCOUNTER — Inpatient Hospital Stay
Admission: EM | Admit: 2020-02-18 | Discharge: 2020-02-26 | DRG: 291 | Disposition: A | Discharge status: Home Health | Payer: Medicare Other | Attending: Internal Medicine | Admitting: Internal Medicine

## 2020-02-18 ENCOUNTER — Other Ambulatory Visit: Payer: Self-pay

## 2020-02-18 DIAGNOSIS — I4891 Unspecified atrial fibrillation: Secondary | ICD-10-CM | POA: Diagnosis not present

## 2020-02-18 DIAGNOSIS — M810 Age-related osteoporosis without current pathological fracture: Secondary | ICD-10-CM | POA: Diagnosis present

## 2020-02-18 DIAGNOSIS — I739 Peripheral vascular disease, unspecified: Secondary | ICD-10-CM | POA: Diagnosis present

## 2020-02-18 DIAGNOSIS — I110 Hypertensive heart disease with heart failure: Secondary | ICD-10-CM | POA: Diagnosis not present

## 2020-02-18 DIAGNOSIS — H25019 Cortical age-related cataract, unspecified eye: Secondary | ICD-10-CM | POA: Diagnosis present

## 2020-02-18 DIAGNOSIS — Z884 Allergy status to anesthetic agent status: Secondary | ICD-10-CM

## 2020-02-18 DIAGNOSIS — I10 Essential (primary) hypertension: Secondary | ICD-10-CM | POA: Diagnosis present

## 2020-02-18 DIAGNOSIS — J45901 Unspecified asthma with (acute) exacerbation: Secondary | ICD-10-CM | POA: Diagnosis not present

## 2020-02-18 DIAGNOSIS — Z85828 Personal history of other malignant neoplasm of skin: Secondary | ICD-10-CM

## 2020-02-18 DIAGNOSIS — Z7983 Long term (current) use of bisphosphonates: Secondary | ICD-10-CM

## 2020-02-18 DIAGNOSIS — Z79899 Other long term (current) drug therapy: Secondary | ICD-10-CM

## 2020-02-18 DIAGNOSIS — Z8719 Personal history of other diseases of the digestive system: Secondary | ICD-10-CM

## 2020-02-18 DIAGNOSIS — Z7982 Long term (current) use of aspirin: Secondary | ICD-10-CM

## 2020-02-18 DIAGNOSIS — Z800 Family history of malignant neoplasm of digestive organs: Secondary | ICD-10-CM

## 2020-02-18 DIAGNOSIS — I4819 Other persistent atrial fibrillation: Secondary | ICD-10-CM | POA: Diagnosis present

## 2020-02-18 DIAGNOSIS — J9601 Acute respiratory failure with hypoxia: Secondary | ICD-10-CM | POA: Diagnosis not present

## 2020-02-18 DIAGNOSIS — Z8249 Family history of ischemic heart disease and other diseases of the circulatory system: Secondary | ICD-10-CM

## 2020-02-18 DIAGNOSIS — E66813 Obesity, class 3: Secondary | ICD-10-CM

## 2020-02-18 DIAGNOSIS — J9621 Acute and chronic respiratory failure with hypoxia: Secondary | ICD-10-CM | POA: Diagnosis present

## 2020-02-18 DIAGNOSIS — D72829 Elevated white blood cell count, unspecified: Secondary | ICD-10-CM | POA: Diagnosis not present

## 2020-02-18 DIAGNOSIS — Z888 Allergy status to other drugs, medicaments and biological substances status: Secondary | ICD-10-CM

## 2020-02-18 DIAGNOSIS — Z808 Family history of malignant neoplasm of other organs or systems: Secondary | ICD-10-CM

## 2020-02-18 DIAGNOSIS — R21 Rash and other nonspecific skin eruption: Secondary | ICD-10-CM | POA: Diagnosis not present

## 2020-02-18 DIAGNOSIS — M170 Bilateral primary osteoarthritis of knee: Secondary | ICD-10-CM | POA: Diagnosis present

## 2020-02-18 DIAGNOSIS — Z880 Allergy status to penicillin: Secondary | ICD-10-CM

## 2020-02-18 DIAGNOSIS — Z96651 Presence of right artificial knee joint: Secondary | ICD-10-CM | POA: Diagnosis present

## 2020-02-18 DIAGNOSIS — E785 Hyperlipidemia, unspecified: Secondary | ICD-10-CM | POA: Diagnosis present

## 2020-02-18 DIAGNOSIS — Z981 Arthrodesis status: Secondary | ICD-10-CM

## 2020-02-18 DIAGNOSIS — Z806 Family history of leukemia: Secondary | ICD-10-CM

## 2020-02-18 DIAGNOSIS — Z20822 Contact with and (suspected) exposure to covid-19: Secondary | ICD-10-CM | POA: Diagnosis present

## 2020-02-18 DIAGNOSIS — K219 Gastro-esophageal reflux disease without esophagitis: Secondary | ICD-10-CM | POA: Diagnosis present

## 2020-02-18 DIAGNOSIS — Z803 Family history of malignant neoplasm of breast: Secondary | ICD-10-CM

## 2020-02-18 DIAGNOSIS — R42 Dizziness and giddiness: Secondary | ICD-10-CM | POA: Diagnosis not present

## 2020-02-18 DIAGNOSIS — I5033 Acute on chronic diastolic (congestive) heart failure: Secondary | ICD-10-CM

## 2020-02-18 DIAGNOSIS — Z8261 Family history of arthritis: Secondary | ICD-10-CM

## 2020-02-18 DIAGNOSIS — Z6841 Body Mass Index (BMI) 40.0 and over, adult: Secondary | ICD-10-CM

## 2020-02-18 DIAGNOSIS — Z7901 Long term (current) use of anticoagulants: Secondary | ICD-10-CM

## 2020-02-18 DIAGNOSIS — I480 Paroxysmal atrial fibrillation: Secondary | ICD-10-CM | POA: Diagnosis present

## 2020-02-18 DIAGNOSIS — R791 Abnormal coagulation profile: Secondary | ICD-10-CM

## 2020-02-18 DIAGNOSIS — G4733 Obstructive sleep apnea (adult) (pediatric): Secondary | ICD-10-CM | POA: Diagnosis present

## 2020-02-18 DIAGNOSIS — E7800 Pure hypercholesterolemia, unspecified: Secondary | ICD-10-CM | POA: Diagnosis present

## 2020-02-18 DIAGNOSIS — T380X5A Adverse effect of glucocorticoids and synthetic analogues, initial encounter: Secondary | ICD-10-CM | POA: Diagnosis not present

## 2020-02-18 LAB — BASIC METABOLIC PANEL
Anion gap: 10 (ref 5–15)
BUN: 14 mg/dL (ref 8–23)
CO2: 27 mmol/L (ref 22–32)
Calcium: 8.9 mg/dL (ref 8.9–10.3)
Chloride: 102 mmol/L (ref 98–111)
Creatinine, Ser: 0.79 mg/dL (ref 0.44–1.00)
GFR calc Af Amer: >60 mL/min (ref >60)
GFR calc non Af Amer: >60 mL/min (ref >60)
Glucose, Bld: 124 mg/dL — ABNORMAL HIGH (ref 70–99)
Potassium: 4.9 mmol/L (ref 3.5–5.1)
Sodium: 139 mmol/L (ref 135–145)

## 2020-02-18 LAB — CBC WITH DIFFERENTIAL/PLATELET
Abs Immature Granulocytes: 0.05 10*3/uL (ref 0.00–0.07)
Basophils Absolute: 0 10*3/uL (ref 0.0–0.1)
Basophils Relative: 0 %
Eosinophils Absolute: 0 10*3/uL (ref 0.0–0.5)
Eosinophils Relative: 0 %
HCT: 40.9 % (ref 36.0–46.0)
Hemoglobin: 13.1 g/dL (ref 12.0–15.0)
Immature Granulocytes: 0 %
Lymphocytes Relative: 5 %
Lymphs Abs: 0.6 10*3/uL — ABNORMAL LOW (ref 0.7–4.0)
MCH: 30.1 pg (ref 26.0–34.0)
MCHC: 32 g/dL (ref 30.0–36.0)
MCV: 94 fL (ref 80.0–100.0)
Monocytes Absolute: 1.5 10*3/uL — ABNORMAL HIGH (ref 0.1–1.0)
Monocytes Relative: 12 %
Neutro Abs: 10.7 10*3/uL — ABNORMAL HIGH (ref 1.7–7.7)
Neutrophils Relative %: 83 %
Platelets: 231 10*3/uL (ref 150–400)
RBC: 4.35 MIL/uL (ref 3.87–5.11)
RDW: 14.9 % (ref 11.5–15.5)
WBC: 12.9 10*3/uL — ABNORMAL HIGH (ref 4.0–10.5)
nRBC: 0 % (ref 0.0–0.2)

## 2020-02-18 LAB — BLOOD GAS, VENOUS
Acid-Base Excess: 6.2 mmol/L — ABNORMAL HIGH (ref 0.0–2.0)
Bicarbonate: 32.3 mmol/L — ABNORMAL HIGH (ref 20.0–28.0)
O2 Saturation: 90.4 %
Patient temperature: 37
pCO2, Ven: 51 mmHg (ref 44.0–60.0)
pH, Ven: 7.41 (ref 7.250–7.430)
pO2, Ven: 59 mmHg — ABNORMAL HIGH (ref 32.0–45.0)

## 2020-02-18 LAB — SARS CORONAVIRUS 2 BY RT PCR (HOSPITAL ORDER, PERFORMED IN ~~LOC~~ HOSPITAL LAB): SARS Coronavirus 2: NEGATIVE

## 2020-02-18 LAB — PROTIME-INR
INR: 4.7 (ref 0.8–1.2)
Prothrombin Time: 42.9 seconds — ABNORMAL HIGH (ref 11.4–15.2)

## 2020-02-18 LAB — BRAIN NATRIURETIC PEPTIDE: B Natriuretic Peptide: 182.3 pg/mL — ABNORMAL HIGH (ref 0.0–100.0)

## 2020-02-18 LAB — TROPONIN I (HIGH SENSITIVITY)
Troponin I (High Sensitivity): 4 ng/L (ref <18)
Troponin I (High Sensitivity): 5 ng/L (ref <18)

## 2020-02-18 MED ORDER — METOPROLOL TARTRATE 50 MG PO TABS
50.0000 mg | ORAL_TABLET | Freq: Two times a day (BID) | ORAL | Status: DC
  Administered 2020-02-18 – 2020-02-24 (×12): 50 mg via ORAL
  Filled 2020-02-18 (×11): qty 1

## 2020-02-18 MED ORDER — LEVALBUTEROL HCL 0.63 MG/3ML IN NEBU
0.6300 mg | INHALATION_SOLUTION | Freq: Four times a day (QID) | RESPIRATORY_TRACT | Status: DC
  Administered 2020-02-19 – 2020-02-20 (×7): 0.63 mg via RESPIRATORY_TRACT
  Filled 2020-02-18 (×9): qty 3

## 2020-02-18 MED ORDER — METHYLPREDNISOLONE SODIUM SUCC 125 MG IJ SOLR
125.0000 mg | Freq: Once | INTRAMUSCULAR | Status: AC
  Administered 2020-02-18: 125 mg via INTRAVENOUS
  Filled 2020-02-18: qty 2

## 2020-02-18 MED ORDER — METHYLPREDNISOLONE SODIUM SUCC 125 MG IJ SOLR
60.0000 mg | Freq: Three times a day (TID) | INTRAMUSCULAR | Status: DC
  Administered 2020-02-19 – 2020-02-21 (×7): 60 mg via INTRAVENOUS
  Filled 2020-02-18 (×7): qty 2

## 2020-02-18 MED ORDER — ONDANSETRON HCL 4 MG PO TABS: 4.0000 mg | ORAL_TABLET | Freq: Four times a day (QID) | ORAL | Status: DC | PRN

## 2020-02-18 MED ORDER — IPRATROPIUM-ALBUTEROL 0.5-2.5 (3) MG/3ML IN SOLN
9.0000 mL | Freq: Once | RESPIRATORY_TRACT | Status: AC
  Administered 2020-02-18: 9 mL via RESPIRATORY_TRACT
  Filled 2020-02-18: qty 9

## 2020-02-18 MED ORDER — IPRATROPIUM BROMIDE 0.02 % IN SOLN
0.5000 mg | Freq: Four times a day (QID) | RESPIRATORY_TRACT | Status: DC
  Administered 2020-02-19 – 2020-02-20 (×7): 0.5 mg via RESPIRATORY_TRACT
  Filled 2020-02-18 (×7): qty 2.5

## 2020-02-18 MED ORDER — MAGNESIUM SULFATE 2 GM/50ML IV SOLN
2.0000 g | Freq: Once | INTRAVENOUS | Status: AC
  Administered 2020-02-18: 2 g via INTRAVENOUS
  Filled 2020-02-18: qty 50

## 2020-02-18 MED ORDER — LORAZEPAM 1 MG PO TABS
1.0000 mg | ORAL_TABLET | Freq: Once | ORAL | Status: AC
  Administered 2020-02-18: 1 mg via ORAL
  Filled 2020-02-18: qty 1

## 2020-02-18 MED ORDER — ALBUTEROL SULFATE (2.5 MG/3ML) 0.083% IN NEBU
2.5000 mg | INHALATION_SOLUTION | Freq: Once | RESPIRATORY_TRACT | Status: AC
  Administered 2020-02-18: 2.5 mg via RESPIRATORY_TRACT
  Filled 2020-02-18: qty 3

## 2020-02-18 MED ORDER — DILTIAZEM HCL-DEXTROSE 125-5 MG/125ML-% IV SOLN (PREMIX)
5.0000 mg/h | INTRAVENOUS | Status: DC
  Administered 2020-02-18: 5 mg/h via INTRAVENOUS
  Administered 2020-02-19: 15 mg/h via INTRAVENOUS
  Filled 2020-02-18 (×2): qty 125

## 2020-02-18 MED ORDER — FUROSEMIDE 10 MG/ML IJ SOLN
40.0000 mg | Freq: Once | INTRAMUSCULAR | Status: AC
  Administered 2020-02-18: 40 mg via INTRAVENOUS
  Filled 2020-02-18: qty 4

## 2020-02-18 MED ORDER — ONDANSETRON HCL 4 MG/2ML IJ SOLN: 4.0000 mg | Freq: Four times a day (QID) | INTRAMUSCULAR | Status: DC | PRN

## 2020-02-18 MED ORDER — BUDESONIDE 0.25 MG/2ML IN SUSP
0.2500 mg | Freq: Two times a day (BID) | RESPIRATORY_TRACT | Status: DC
  Administered 2020-02-19 – 2020-02-26 (×14): 0.25 mg via RESPIRATORY_TRACT
  Filled 2020-02-18 (×17): qty 2

## 2020-02-18 MED ORDER — DILTIAZEM HCL 25 MG/5ML IV SOLN
10.0000 mg | Freq: Once | INTRAVENOUS | Status: AC
  Administered 2020-02-18: 10 mg via INTRAVENOUS
  Filled 2020-02-18: qty 5

## 2020-02-18 MED ORDER — FUROSEMIDE 10 MG/ML IJ SOLN
40.0000 mg | Freq: Once | INTRAMUSCULAR | Status: AC
  Administered 2020-02-19: 40 mg via INTRAVENOUS
  Filled 2020-02-18: qty 4

## 2020-02-18 MED ORDER — DILTIAZEM HCL 25 MG/5ML IV SOLN
15.0000 mg | Freq: Once | INTRAVENOUS | Status: AC
  Administered 2020-02-18: 15 mg via INTRAVENOUS
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
## 2020-02-18 NOTE — ED Notes (Signed)
Critical lab value INR 4.7, Dr. Charna Archer notified.

## 2020-02-18 NOTE — ED Notes (Signed)
Pt's O2 sats staying below 90% for prolonged periods while on 6L ; placed on non-rebreather, currently satting 99%.

## 2020-02-18 NOTE — ED Notes (Signed)
Pt requested something for anxiety, does not feel like she is getting enough oxygen and it makes her feel anxious.  O2 sat 92% at this time on 5L [place].
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
## 2020-02-18 NOTE — ED Triage Notes (Signed)
Pt arrives via EMS for [facility] with exertion and weakness- pt has a hx of a fib and asthma- per EMS pt rate was 110-140- after 250 ml of fluids rate went to 110-125- pt normally does not use O2 but has it PRN- pt currently on her PRN O2

## 2020-02-19 DIAGNOSIS — T380X5A Adverse effect of glucocorticoids and synthetic analogues, initial encounter: Secondary | ICD-10-CM | POA: Diagnosis not present

## 2020-02-19 DIAGNOSIS — Z806 Family history of leukemia: Secondary | ICD-10-CM | POA: Diagnosis not present

## 2020-02-19 DIAGNOSIS — R21 Rash and other nonspecific skin eruption: Secondary | ICD-10-CM | POA: Diagnosis not present

## 2020-02-19 DIAGNOSIS — Z20822 Contact with and (suspected) exposure to covid-19: Secondary | ICD-10-CM | POA: Diagnosis present

## 2020-02-19 DIAGNOSIS — E785 Hyperlipidemia, unspecified: Secondary | ICD-10-CM | POA: Diagnosis present

## 2020-02-19 DIAGNOSIS — J45901 Unspecified asthma with (acute) exacerbation: Secondary | ICD-10-CM | POA: Diagnosis present

## 2020-02-19 DIAGNOSIS — K219 Gastro-esophageal reflux disease without esophagitis: Secondary | ICD-10-CM | POA: Diagnosis present

## 2020-02-19 DIAGNOSIS — M170 Bilateral primary osteoarthritis of knee: Secondary | ICD-10-CM | POA: Diagnosis present

## 2020-02-19 DIAGNOSIS — D72829 Elevated white blood cell count, unspecified: Secondary | ICD-10-CM | POA: Diagnosis not present

## 2020-02-19 DIAGNOSIS — R42 Dizziness and giddiness: Secondary | ICD-10-CM | POA: Diagnosis not present

## 2020-02-19 DIAGNOSIS — E7800 Pure hypercholesterolemia, unspecified: Secondary | ICD-10-CM | POA: Diagnosis present

## 2020-02-19 DIAGNOSIS — R791 Abnormal coagulation profile: Secondary | ICD-10-CM | POA: Diagnosis present

## 2020-02-19 DIAGNOSIS — I5033 Acute on chronic diastolic (congestive) heart failure: Secondary | ICD-10-CM | POA: Diagnosis present

## 2020-02-19 DIAGNOSIS — I739 Peripheral vascular disease, unspecified: Secondary | ICD-10-CM | POA: Diagnosis present

## 2020-02-19 DIAGNOSIS — I10 Essential (primary) hypertension: Secondary | ICD-10-CM | POA: Diagnosis not present

## 2020-02-19 DIAGNOSIS — J9621 Acute and chronic respiratory failure with hypoxia: Secondary | ICD-10-CM | POA: Diagnosis present

## 2020-02-19 DIAGNOSIS — I110 Hypertensive heart disease with heart failure: Secondary | ICD-10-CM | POA: Diagnosis present

## 2020-02-19 DIAGNOSIS — J9601 Acute respiratory failure with hypoxia: Secondary | ICD-10-CM | POA: Diagnosis present

## 2020-02-19 DIAGNOSIS — G4733 Obstructive sleep apnea (adult) (pediatric): Secondary | ICD-10-CM | POA: Diagnosis present

## 2020-02-19 DIAGNOSIS — I4891 Unspecified atrial fibrillation: Secondary | ICD-10-CM | POA: Diagnosis not present

## 2020-02-19 DIAGNOSIS — Z6841 Body Mass Index (BMI) 40.0 and over, adult: Secondary | ICD-10-CM | POA: Diagnosis not present

## 2020-02-19 DIAGNOSIS — M810 Age-related osteoporosis without current pathological fracture: Secondary | ICD-10-CM | POA: Diagnosis present

## 2020-02-19 DIAGNOSIS — I480 Paroxysmal atrial fibrillation: Secondary | ICD-10-CM | POA: Diagnosis present

## 2020-02-19 DIAGNOSIS — Z803 Family history of malignant neoplasm of breast: Secondary | ICD-10-CM | POA: Diagnosis not present

## 2020-02-19 DIAGNOSIS — I4819 Other persistent atrial fibrillation: Secondary | ICD-10-CM | POA: Diagnosis present

## 2020-02-19 DIAGNOSIS — H25019 Cortical age-related cataract, unspecified eye: Secondary | ICD-10-CM | POA: Diagnosis present

## 2020-02-19 LAB — COMPREHENSIVE METABOLIC PANEL
ALT: 21 U/L (ref 0–44)
AST: 21 U/L (ref 15–41)
Albumin: 3.5 g/dL (ref 3.5–5.0)
Alkaline Phosphatase: 68 U/L (ref 38–126)
Anion gap: 12 (ref 5–15)
BUN: 15 mg/dL (ref 8–23)
CO2: 29 mmol/L (ref 22–32)
Calcium: 8.4 mg/dL — ABNORMAL LOW (ref 8.9–10.3)
Chloride: 98 mmol/L (ref 98–111)
Creatinine, Ser: 0.84 mg/dL (ref 0.44–1.00)
GFR calc Af Amer: >60 mL/min (ref >60)
GFR calc non Af Amer: >60 mL/min (ref >60)
Glucose, Bld: 196 mg/dL — ABNORMAL HIGH (ref 70–99)
Potassium: 3.6 mmol/L (ref 3.5–5.1)
Sodium: 139 mmol/L (ref 135–145)
Total Bilirubin: 1 mg/dL (ref 0.3–1.2)
Total Protein: 7.4 g/dL (ref 6.5–8.1)

## 2020-02-19 LAB — CBC
HCT: 42 % (ref 36.0–46.0)
Hemoglobin: 13.4 g/dL (ref 12.0–15.0)
MCH: 29.8 pg (ref 26.0–34.0)
MCHC: 31.9 g/dL (ref 30.0–36.0)
MCV: 93.5 fL (ref 80.0–100.0)
Platelets: 190 10*3/uL (ref 150–400)
RBC: 4.49 MIL/uL (ref 3.87–5.11)
RDW: 14.5 % (ref 11.5–15.5)
WBC: 13.4 10*3/uL — ABNORMAL HIGH (ref 4.0–10.5)
nRBC: 0 % (ref 0.0–0.2)

## 2020-02-19 LAB — APTT: aPTT: 101 seconds — ABNORMAL HIGH (ref 24–36)

## 2020-02-19 LAB — PROTIME-INR
INR: 5.9 (ref 0.8–1.2)
Prothrombin Time: 50.9 seconds — ABNORMAL HIGH (ref 11.4–15.2)

## 2020-02-19 MED ORDER — ACETAMINOPHEN 325 MG PO TABS: 650.0000 mg | ORAL_TABLET | Freq: Four times a day (QID) | ORAL | Status: DC | PRN

## 2020-02-19 MED ORDER — DILTIAZEM HCL 30 MG PO TABS
300.0000 mg | ORAL_TABLET | Freq: Every morning | ORAL | Status: DC
  Administered 2020-02-19: 300 mg via ORAL
  Filled 2020-02-19: qty 5

## 2020-02-19 MED ORDER — TRAMADOL HCL 50 MG PO TABS: 50.0000 mg | ORAL_TABLET | Freq: Four times a day (QID) | ORAL | Status: DC | PRN

## 2020-02-19 MED ORDER — DILTIAZEM HCL 30 MG PO TABS
60.0000 mg | ORAL_TABLET | Freq: Every day | ORAL | Status: DC
  Administered 2020-02-19 – 2020-02-25 (×7): 60 mg via ORAL
  Filled 2020-02-19 (×8): qty 2

## 2020-02-19 MED ORDER — MONTELUKAST SODIUM 10 MG PO TABS
10.0000 mg | ORAL_TABLET | Freq: Every day | ORAL | Status: DC
  Administered 2020-02-19 – 2020-02-25 (×7): 10 mg via ORAL
  Filled 2020-02-19 (×7): qty 1

## 2020-02-19 MED ORDER — DILTIAZEM HCL ER COATED BEADS 180 MG PO CP24
300.0000 mg | ORAL_CAPSULE | Freq: Every day | ORAL | Status: DC
  Administered 2020-02-20 – 2020-02-26 (×7): 300 mg via ORAL
  Filled 2020-02-19 (×7): qty 1

## 2020-02-19 MED ORDER — PHYTONADIONE 5 MG PO TABS
2.5000 mg | ORAL_TABLET | Freq: Once | ORAL | Status: AC
  Administered 2020-02-19: 2.5 mg via ORAL
  Filled 2020-02-19 (×2): qty 1

## 2020-02-19 NOTE — Progress Notes (Signed)
PROGRESS NOTE    Laura Gonzalez  JAS:[id number] DOB: 1940-07-15 DOA: 02/18/2020 PCP: Baxter Hire, MD   Assessment & Plan:   Active Problems:   Asthma exacerbation   Asthma exacerbation: continue on bronchodilators, IV steroids, and supplemental oxygen. Encourage incentive spirometry  Acute on chronic hypoxic respiratory failure: likely secondary to asthma exacerbation. Continue on BiPAP and wean as tolerated.  Management as stated above  A. fib: w/ RVR. Continue on cardizem, metoprolol. Weaned off of cardizem drip. Continue on warfarin. Pharmacy to monitor and dose   Supratherapeutic INR: pharmacy consulted for warfarin monitoring and dosing   Acute on chronic diastolic CHF: continue on IV lasix. Monitor I/Os. Fluid restriction   Leukocytosis: likely secondary to steroid use.   HTN: continue with home dose of metoprolol and diltiazem  Morbid obesity: BMI 49.0. Would benefit from significant weight loss.    DVT prophylaxis: warfarin  Code Status: full  Family Communication:  Disposition Plan: depends on PT/OT recs    Status is: Inpatient  Remains inpatient appropriate because:IV treatments appropriate due to intensity of illness or inability to take PO   Dispo: The patient is from: Home              Anticipated d/c is to: Home vs SNF               Anticipated d/c date is: 3 days              Patient currently is not medically stable to d/c.   Consultants:   Procedures:    Antimicrobials:      Subjective: Pt c/o shortness of breath   Objective: Vitals:   02/19/20 0430 02/19/20 0500 02/19/20 0530 02/19/20 0600  BP: 126/83 114/76 126/74 112/63  Pulse: 100 90 94 85  Resp: (!) 22 (!) 21 18 18   Temp:      TempSrc:      SpO2: 96% 96% 97% 97%  Weight:      Height:        Intake/Output Summary (Last 24 hours) at 02/19/2020 0810 Last data filed at 02/19/2020 0519 Gross per 24 hour  Intake 150 ml  Output --  Net 150 ml   Filed Weights    02/18/20 1808  Weight: 133.8 kg    Examination:  General exam: Appears calm and comfortable  Respiratory system: decreased breath sounds b/l  Cardiovascular system: irregularly irregular. No  rubs, gallops or clicks.  Gastrointestinal system: Abdomen is nondistended, soft and nontender.  Normal bowel sounds heard. Central nervous system: Alert and oriented. Moves all 4 extremities Psychiatry: Judgement and insight appear normal. Flat mood and affect     Data Reviewed: I have personally reviewed following labs and imaging studies  CBC: Recent Labs  Lab 02/18/20 1805 02/19/20 0619  WBC 12.9* 13.4*  NEUTROABS 10.7*  --   HGB 13.1 13.4  HCT 40.9 42.0  MCV 94.0 93.5  PLT 231 419   Basic Metabolic Panel: Recent Labs  Lab 02/18/20 1805 02/19/20 0619  NA 139 139  K 4.9 3.6  CL 102 98  CO2 27 29  GLUCOSE 124* 196*  BUN 14 15  CREATININE 0.79 0.84  CALCIUM 8.9 8.4*   GFR: Estimated Creatinine Clearance: 76.4 mL/min (by C-G formula based on SCr of 0.84 mg/dL). Liver Function Tests: Recent Labs  Lab 02/19/20 0619  AST 21  ALT 21  ALKPHOS 68  BILITOT 1.0  PROT 7.4  ALBUMIN 3.5   No results for input(s):  LIPASE, AMYLASE in the last 168 hours. No results for input(s): AMMONIA in the last 168 hours. Coagulation Profile: Recent Labs  Lab 02/18/20 2002 02/19/20 0619  INR 4.7* 5.9*   Cardiac Enzymes: No results for input(s): CKTOTAL, CKMB, CKMBINDEX, TROPONINI in the last 168 hours. BNP (last 3 results) No results for input(s): PROBNP in the last 8760 hours. HbA1C: No results for input(s): HGBA1C in the last 72 hours. CBG: No results for input(s): GLUCAP in the last 168 hours. Lipid Profile: No results for input(s): CHOL, HDL, LDLCALC, TRIG, CHOLHDL, LDLDIRECT in the last 72 hours. Thyroid Function Tests: No results for input(s): TSH, T4TOTAL, FREET4, T3FREE, THYROIDAB in the last 72 hours. Anemia Panel: No results for input(s): VITAMINB12, FOLATE, FERRITIN,  TIBC, IRON, RETICCTPCT in the last 72 hours. Sepsis Labs: No results for input(s): PROCALCITON, LATICACIDVEN in the last 168 hours.  Recent Results (from the past 240 hour(s))  SARS Coronavirus 2 by RT PCR (hospital order, performed in [facility] hospital lab) Nasopharyngeal Nasopharyngeal Swab     Status: None   Collection Time: 02/18/20  8:39 PM   Specimen: Nasopharyngeal Swab  Result Value Ref Range Status   SARS Coronavirus 2 NEGATIVE NEGATIVE Final    Comment: (NOTE) SARS-CoV-2 target nucleic acids are NOT DETECTED.  The SARS-CoV-2 RNA is generally detectable in upper and lower respiratory specimens during the acute phase of infection. The lowest concentration of SARS-CoV-2 viral copies this assay can detect is 250 copies / mL. A negative result does not preclude SARS-CoV-2 infection and should not be used as the sole basis for treatment or other patient management decisions.  A negative result may occur with improper specimen collection / handling, submission of specimen other than nasopharyngeal swab, presence of viral mutation(s) within the areas targeted by this assay, and inadequate number of viral copies (<250 copies / mL). A negative result must be combined with clinical observations, patient history, and epidemiological information.  Fact Sheet for Patients:   StrictlyIdeas.no  Fact Sheet for Healthcare Providers: BankingDealers.co.za  This test is not yet approved or  cleared by the [place] FDA and has been authorized for detection and/or diagnosis of SARS-CoV-2 by FDA under an Emergency Use Authorization (EUA).  This EUA will remain in effect (meaning this test can be used) for the duration of the COVID-19 declaration under Section 564(b)(1) of the Act, 21 U.S.C. section 360bbb-3(b)(1), unless the authorization is terminated or revoked sooner.  Performed at [facility], [address],  [place], [place] [address]          Radiology Studies: DG Chest 2 View  Result Date: 02/18/2020 CLINICAL DATA:  Shortness of breath EXAM: CHEST - 2 VIEW COMPARISON:  July 06, 2019 FINDINGS: The heart size and mediastinal contour are unchanged with cardiomegaly. There is prominence of the central pulmonary vasculature with mildly increased interstitial markings. There is blunting of the left costophrenic angle, likely layering effusion. IMPRESSION: Cardiomegaly and mild interstitial edema. Probable small left layering pleural effusion. Electronically Signed   By: Prudencio Pair M.D.   On: 02/18/2020 19:12        Scheduled Meds: . budesonide (PULMICORT) nebulizer solution  0.25 mg Nebulization BID  . ipratropium  0.5 mg Nebulization Q6H  . levalbuterol  0.63 mg Nebulization Q6H  . methylPREDNISolone (SOLU-MEDROL) injection  60 mg Intravenous Q8H  . metoprolol tartrate  50 mg Oral BID   Continuous Infusions: . diltiazem (CARDIZEM) infusion 15 mg/hr (02/19/20 0521)     LOS:  0 days    Time spent: 33 mins     Wyvonnia Dusky, MD [facility] Pager [phone]  If 7PM-7AM, please contact night-coverage [email or web address] 02/19/2020, 8:10 AM

## 2020-02-19 NOTE — Progress Notes (Signed)
Patient remains a yellow MEWS after transfer from ED , will continue Yellow MEWS per protocol , no need to escalate at this time   02/19/20 1905  Assess: MEWS Score  Temp 97.6 F (36.4 C)  BP 117/68  Pulse Rate (!) 109  Resp (!) 25  Level of Consciousness Alert  SpO2 95 %  O2 Device Nasal Cannula  O2 Flow Rate (L/min) 4 L/min  Assess: MEWS Score  MEWS Temp 0  MEWS Systolic 0  MEWS Pulse 1  MEWS RR 1  MEWS LOC 0  MEWS Score 2  MEWS Score Color Yellow  Assess: if the MEWS score is Yellow or Red  Were vital signs taken at a resting state? Yes  Focused Assessment No change from prior assessment  Early Detection of Sepsis Score *See Row Information* Low  MEWS guidelines implemented *See Row Information* Yes  Treat  MEWS Interventions Other (Comment) (continue to monitor)  Pain Scale 0-10  Pain Score 0  Take Vital Signs  Increase Vital Sign Frequency  Yellow: Q 2hr X 2 then Q 4hr X 2, if remains yellow, continue Q 4hrs  Notify: Charge Nurse/RN  Name of Charge Nurse/RN Notified Tam summers, RN  Date Charge Nurse/RN Notified 02/19/20  Time Charge Nurse/RN Notified 1913  Document  Progress note created (see row info) Yes

## 2020-02-19 NOTE — ED Notes (Signed)
Patient assisted to recliner in room. Patient instructed not to get up without calling staff
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
## 2020-02-19 NOTE — ED Notes (Signed)
report called to [facility] rn floor nurse

## 2020-02-19 NOTE — ED Notes (Signed)
Bi-pap on standby and nasal canula placed on patient for trial run per MD Eppie Gibson order. Patient placed on 4L O2 nasal canula

## 2020-02-19 NOTE — ED Notes (Signed)
Resumed care from [employer].  Pt alert  Sitting in a recliner pt on 4l iters oxygen. Iv in place. Pt waiting on admission bed.

## 2020-02-20 LAB — CBC
HCT: 43.6 % (ref 36.0–46.0)
Hemoglobin: 14 g/dL (ref 12.0–15.0)
MCH: 29.9 pg (ref 26.0–34.0)
MCHC: 32.1 g/dL (ref 30.0–36.0)
MCV: 93 fL (ref 80.0–100.0)
Platelets: 231 10*3/uL (ref 150–400)
RBC: 4.69 MIL/uL (ref 3.87–5.11)
RDW: 14.6 % (ref 11.5–15.5)
WBC: 17.6 10*3/uL — ABNORMAL HIGH (ref 4.0–10.5)
nRBC: 0 % (ref 0.0–0.2)

## 2020-02-20 LAB — BASIC METABOLIC PANEL
Anion gap: 13 (ref 5–15)
BUN: 31 mg/dL — ABNORMAL HIGH (ref 8–23)
CO2: 30 mmol/L (ref 22–32)
Calcium: 8.8 mg/dL — ABNORMAL LOW (ref 8.9–10.3)
Chloride: 96 mmol/L — ABNORMAL LOW (ref 98–111)
Creatinine, Ser: 0.93 mg/dL (ref 0.44–1.00)
GFR calc Af Amer: >60 mL/min (ref >60)
GFR calc non Af Amer: 59 mL/min — ABNORMAL LOW (ref >60)
Glucose, Bld: 151 mg/dL — ABNORMAL HIGH (ref 70–99)
Potassium: 3.8 mmol/L (ref 3.5–5.1)
Sodium: 139 mmol/L (ref 135–145)

## 2020-02-20 LAB — PROTIME-INR
INR: 5.2 (ref 0.8–1.2)
Prothrombin Time: 46.6 seconds — ABNORMAL HIGH (ref 11.4–15.2)

## 2020-02-20 MED ORDER — ALBUTEROL SULFATE (2.5 MG/3ML) 0.083% IN NEBU
2.5000 mg | INHALATION_SOLUTION | RESPIRATORY_TRACT | Status: DC | PRN
  Administered 2020-02-24: 2.5 mg via RESPIRATORY_TRACT
  Filled 2020-02-20: qty 3

## 2020-02-20 MED ORDER — ENSURE ENLIVE PO LIQD
237.0000 mL | Freq: Three times a day (TID) | ORAL | Status: DC
  Administered 2020-02-20 – 2020-02-26 (×18): 237 mL via ORAL

## 2020-02-20 MED ORDER — IPRATROPIUM-ALBUTEROL 0.5-2.5 (3) MG/3ML IN SOLN
3.0000 mL | Freq: Two times a day (BID) | RESPIRATORY_TRACT | Status: DC
  Administered 2020-02-20 – 2020-02-26 (×10): 3 mL via RESPIRATORY_TRACT
  Filled 2020-02-20 (×12): qty 3

## 2020-02-20 MED ORDER — FUROSEMIDE 10 MG/ML IJ SOLN
40.0000 mg | Freq: Every day | INTRAMUSCULAR | Status: DC
  Administered 2020-02-20 – 2020-02-22 (×3): 40 mg via INTRAVENOUS
  Filled 2020-02-20 (×3): qty 4
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
## 2020-02-20 NOTE — Progress Notes (Signed)
OT Cancellation Note  Patient Details Name: Laura Gonzalez MRN: [id number] DOB: 1940/07/17   Cancelled Treatment:    Reason Eval/Treat Not Completed: Patient not medically ready.Consult received, chart reviewed. Pt's INR 5.2  falling outside of guidelines for participation with OT services.Will attempt to see pt at a future date/time as medically appropriate.   Darleen Crocker, [place], OTR/L , CBIS ascom [phone]  02/20/20, 8:25 AM  02/20/2020, 8:24 AM

## 2020-02-20 NOTE — Progress Notes (Signed)
PROGRESS NOTE    Laura Gonzalez  JIR:[id number] DOB: 05-02-1941 DOA: 02/18/2020 PCP: Baxter Hire, MD   Assessment & Plan:   Active Problems:   Asthma exacerbation   Asthma exacerbation: continue on bronchodilators, IV steroids, and supplemental oxygen. Encourage incentive spirometry  Acute on chronic hypoxic respiratory failure: likely secondary to asthma exacerbation. Weaned off of BiPAP and currently on 5L [place]. Intermittently wears 3L [place] at home prn as per pt. Management as stated above  A. fib: w/ RVR. Continue on cardizem, metoprolol. Weaned off of cardizem drip. Continue on warfarin. Pharmacy to monitor and dose   Supratherapeutic INR: s/p vitamin K x1. Pharmacy following for warfarin monitoring and dosing   Acute on chronic diastolic CHF: continue on IV lasix. Monitor I/Os. Fluid restriction   Leukocytosis: likely secondary to steroid use.   HTN: continue with home dose of metoprolol and diltiazem  Morbid obesity: BMI 49.0. Would benefit from significant weight loss.    DVT prophylaxis: warfarin  Code Status: full  Family Communication:  Disposition Plan: depends on PT/OT recs    Status is: Inpatient  Remains inpatient appropriate because:IV treatments appropriate due to intensity of illness or inability to take PO still w/ significant SOB at rest    Dispo: The patient is from: Home              Anticipated d/c is to: Home vs SNF               Anticipated d/c date is: 3 days              Patient currently is not medically stable to d/c.   Consultants:   Procedures:    Antimicrobials:      Subjective: Pt still c/o shortness of breath and unchanged from day prior.   Objective: Vitals:   02/19/20 2057 02/19/20 2334 02/20/20 0112 02/20/20 0320  BP: 133/66 114/62  (!) 120/92  Pulse: (!) 110 84  84  Resp: 18 17  18   Temp: 97.8 F (36.6 C) 97.7 F (36.5 C)  (!) 97.5 F (36.4 C)  TempSrc: Oral Oral  Oral  SpO2: 95% 92% 92% 91%  Weight:       Height:        Intake/Output Summary (Last 24 hours) at 02/20/2020 0711 Last data filed at 02/19/2020 1128 Gross per 24 hour  Intake 90.67 ml  Output --  Net 90.67 ml   Filed Weights   02/18/20 1808  Weight: 133.8 kg    Examination: General exam: Appears calm and comfortable  Respiratory system: severely diminished breath sounds b/l. No wheezes  Cardiovascular system: irregularly irregular. No  rubs, gallops or clicks.  Gastrointestinal system: Abdomen is obese, soft and nontender.  Hypoactive bowel sounds heard. Central nervous system: Alert and oriented. Moves all 4 extremities Psychiatry: Judgement and insight appear normal. Flat mood and affect      Data Reviewed: I have personally reviewed following labs and imaging studies  CBC: Recent Labs  Lab 02/18/20 1805 02/19/20 0619 02/20/20 0202  WBC 12.9* 13.4* 17.6*  NEUTROABS 10.7*  --   --   HGB 13.1 13.4 14.0  HCT 40.9 42.0 43.6  MCV 94.0 93.5 93.0  PLT 231 190 751   Basic Metabolic Panel: Recent Labs  Lab 02/18/20 1805 02/19/20 0619 02/20/20 0202  NA 139 139 139  K 4.9 3.6 3.8  CL 102 98 96*  CO2 27 29 30   GLUCOSE 124* 196* 151*  BUN 14 15  31*  CREATININE 0.79 0.84 0.93  CALCIUM 8.9 8.4* 8.8*   GFR: Estimated Creatinine Clearance: 69 mL/min (by C-G formula based on SCr of 0.93 mg/dL). Liver Function Tests: Recent Labs  Lab 02/19/20 0619  AST 21  ALT 21  ALKPHOS 68  BILITOT 1.0  PROT 7.4  ALBUMIN 3.5   No results for input(s): LIPASE, AMYLASE in the last 168 hours. No results for input(s): AMMONIA in the last 168 hours. Coagulation Profile: Recent Labs  Lab 02/18/20 2002 02/19/20 0619 02/20/20 0202  INR 4.7* 5.9* 5.2*   Cardiac Enzymes: No results for input(s): CKTOTAL, CKMB, CKMBINDEX, TROPONINI in the last 168 hours. BNP (last 3 results) No results for input(s): PROBNP in the last 8760 hours. HbA1C: No results for input(s): HGBA1C in the last 72 hours. CBG: No results for  input(s): GLUCAP in the last 168 hours. Lipid Profile: No results for input(s): CHOL, HDL, LDLCALC, TRIG, CHOLHDL, LDLDIRECT in the last 72 hours. Thyroid Function Tests: No results for input(s): TSH, T4TOTAL, FREET4, T3FREE, THYROIDAB in the last 72 hours. Anemia Panel: No results for input(s): VITAMINB12, FOLATE, FERRITIN, TIBC, IRON, RETICCTPCT in the last 72 hours. Sepsis Labs: No results for input(s): PROCALCITON, LATICACIDVEN in the last 168 hours.  Recent Results (from the past 240 hour(s))  SARS Coronavirus 2 by RT PCR (hospital order, performed in [facility] hospital lab) Nasopharyngeal Nasopharyngeal Swab     Status: None   Collection Time: 02/18/20  8:39 PM   Specimen: Nasopharyngeal Swab  Result Value Ref Range Status   SARS Coronavirus 2 NEGATIVE NEGATIVE Final    Comment: (NOTE) SARS-CoV-2 target nucleic acids are NOT DETECTED.  The SARS-CoV-2 RNA is generally detectable in upper and lower respiratory specimens during the acute phase of infection. The lowest concentration of SARS-CoV-2 viral copies this assay can detect is 250 copies / mL. A negative result does not preclude SARS-CoV-2 infection and should not be used as the sole basis for treatment or other patient management decisions.  A negative result may occur with improper specimen collection / handling, submission of specimen other than nasopharyngeal swab, presence of viral mutation(s) within the areas targeted by this assay, and inadequate number of viral copies (<250 copies / mL). A negative result must be combined with clinical observations, patient history, and epidemiological information.  Fact Sheet for Patients:   StrictlyIdeas.no  Fact Sheet for Healthcare Providers: BankingDealers.co.za  This test is not yet approved or  cleared by the [place] FDA and has been authorized for detection and/or diagnosis of SARS-CoV-2 by FDA under an Emergency Use  Authorization (EUA).  This EUA will remain in effect (meaning this test can be used) for the duration of the COVID-19 declaration under Section 564(b)(1) of the Act, 21 U.S.C. section 360bbb-3(b)(1), unless the authorization is terminated or revoked sooner.  Performed at [facility], [address] [place], [place] [address]          Radiology Studies: DG Chest 2 View  Result Date: 02/18/2020 CLINICAL DATA:  Shortness of breath EXAM: CHEST - 2 VIEW COMPARISON:  July 06, 2019 FINDINGS: The heart size and mediastinal contour are unchanged with cardiomegaly. There is prominence of the central pulmonary vasculature with mildly increased interstitial markings. There is blunting of the left costophrenic angle, likely layering effusion. IMPRESSION: Cardiomegaly and mild interstitial edema. Probable small left layering pleural effusion. Electronically Signed   By: Prudencio Pair M.D.   On: 02/18/2020 19:12  Scheduled Meds: . budesonide (PULMICORT) nebulizer solution  0.25 mg Nebulization BID  . diltiazem  300 mg Oral Daily  . diltiazem  60 mg Oral QHS  . ipratropium  0.5 mg Nebulization Q6H  . levalbuterol  0.63 mg Nebulization Q6H  . methylPREDNISolone (SOLU-MEDROL) injection  60 mg Intravenous Q8H  . metoprolol tartrate  50 mg Oral BID  . montelukast  10 mg Oral QHS   Continuous Infusions:    LOS: 1 day    Time spent: 35 mins     Wyvonnia Dusky, MD [facility] Pager [phone]  If 7PM-7AM, please contact night-coverage [email or web address] 02/20/2020, 7:11 AM

## 2020-02-20 NOTE — Progress Notes (Signed)
PT Cancellation Note  Patient Details Name: ANIEYA HELMAN MRN: [id number] DOB: 07-01-1940   Cancelled Treatment:    Reason Eval/Treat Not Completed: Medical issues which prohibited therapy. Consult received and chart reviewed. Patient noted with critically elevated INR (5.2) contraindicated for exertional activity at this time.  Will continue efforts next date pending medical stability and appropriateness.   Lieutenant Diego PT, DPT 8:41 AM,02/20/20

## 2020-02-20 NOTE — Progress Notes (Signed)
Pt continues to have shortness of breath despite medical interventions. Pt was given a 1x time dose of Lasix with adequate urine output. Pt is on 3 liters of Oxygen, O2 sats should remain at 88% or above per order. Pt uses bipap at night and encouraged to use I&S. Receiving nebulizer treatments and steroid treatments every 4 and 8 hours. Pt was unable to work with PT today due to critical elevated INR 5.2. Pt remain in bed today with purewick in place. Pt is a tleast a 1 person assist.

## 2020-02-20 NOTE — Progress Notes (Signed)
Pt with a yellow mews of 2 due to Hr of 120. Hassan Rowan Morrison-Np made aware. Order for cardiac monitor received. Schedule metoprolol and Cardizem given. [place]ews vital signs initiated. Will cont to monitor pt.    02/20/20 2108 02/20/20 2147  Assess: MEWS Score  Temp 98 F (36.7 C)  --   BP (!) 105/59 117/75  Pulse Rate (!) 120 (!) 106  Resp 18 18  SpO2 93 % 92 %  O2 Device CPAP CPAP  Assess: MEWS Score  MEWS Temp 0 0  MEWS Systolic 0 0  MEWS Pulse 2 1  MEWS RR 0 0  MEWS LOC 0 0  MEWS Score 2 1  MEWS Score Color Yellow Green  Assess: if the MEWS score is Yellow or Red  Were vital signs taken at a resting state? Yes  --   Focused Assessment No change from prior assessment  --   Early Detection of Sepsis Score *See Row Information* Medium  --   MEWS guidelines implemented *See Row Information* Yes  --   Take Vital Signs  Increase Vital Sign Frequency  Yellow: Q 2hr X 2 then Q 4hr X 2, if remains yellow, continue Q 4hrs  --   Notify: Charge Nurse/RN  Name of Charge Nurse/RN Notified Sunday Spillers Rn  --   Date Charge Nurse/RN Notified 02/20/20  --   Time Charge Nurse/RN Notified 2110  --   Notify: Provider  Provider Name/Title Anne Ng  --   Date Provider Notified 02/20/20  --   Time Provider Notified 2110  --   Notification Type Page  --   Notification Reason  (yellow mews of 2)  --   Response See new orders (Schedule metoprolo and Cardizem given New order for tele)  --   Date of Provider Response 02/20/20  --   Time of Provider Response 2130  --   Document  Patient Outcome  --  Other (Comment)  Progress note created (see row info)  --  Yes

## 2020-02-21 DIAGNOSIS — I10 Essential (primary) hypertension: Secondary | ICD-10-CM

## 2020-02-21 LAB — CBC
HCT: 45.9 % (ref 36.0–46.0)
Hemoglobin: 14.8 g/dL (ref 12.0–15.0)
MCH: 30.3 pg (ref 26.0–34.0)
MCHC: 32.2 g/dL (ref 30.0–36.0)
MCV: 93.9 fL (ref 80.0–100.0)
Platelets: 266 10*3/uL (ref 150–400)
RBC: 4.89 MIL/uL (ref 3.87–5.11)
RDW: 14.6 % (ref 11.5–15.5)
WBC: 15.5 10*3/uL — ABNORMAL HIGH (ref 4.0–10.5)
nRBC: 0.1 % (ref 0.0–0.2)

## 2020-02-21 LAB — BASIC METABOLIC PANEL
Anion gap: 13 (ref 5–15)
BUN: 40 mg/dL — ABNORMAL HIGH (ref 8–23)
CO2: 31 mmol/L (ref 22–32)
Calcium: 9.7 mg/dL (ref 8.9–10.3)
Chloride: 97 mmol/L — ABNORMAL LOW (ref 98–111)
Creatinine, Ser: 0.9 mg/dL (ref 0.44–1.00)
GFR calc Af Amer: >60 mL/min (ref >60)
GFR calc non Af Amer: >60 mL/min (ref >60)
Glucose, Bld: 230 mg/dL — ABNORMAL HIGH (ref 70–99)
Potassium: 3.8 mmol/L (ref 3.5–5.1)
Sodium: 141 mmol/L (ref 135–145)

## 2020-02-21 LAB — PROTIME-INR
INR: 2.1 — ABNORMAL HIGH (ref 0.8–1.2)
Prothrombin Time: 23.1 seconds — ABNORMAL HIGH (ref 11.4–15.2)

## 2020-02-21 MED ORDER — WARFARIN SODIUM 3 MG PO TABS
3.0000 mg | ORAL_TABLET | Freq: Once | ORAL | Status: AC
  Administered 2020-02-21: 3 mg via ORAL
  Filled 2020-02-21: qty 1

## 2020-02-21 MED ORDER — POTASSIUM CHLORIDE CRYS ER 20 MEQ PO TBCR
20.0000 meq | EXTENDED_RELEASE_TABLET | Freq: Two times a day (BID) | ORAL | Status: DC
  Administered 2020-02-21 – 2020-02-24 (×7): 20 meq via ORAL
  Filled 2020-02-21 (×7): qty 1

## 2020-02-21 MED ORDER — WARFARIN - PHARMACIST DOSING INPATIENT: Freq: Every day | Status: DC

## 2020-02-21 MED ORDER — MAGNESIUM OXIDE 400 (241.3 MG) MG PO TABS
400.0000 mg | ORAL_TABLET | Freq: Two times a day (BID) | ORAL | Status: DC
  Administered 2020-02-21 – 2020-02-26 (×11): 400 mg via ORAL
  Filled 2020-02-21 (×11): qty 1

## 2020-02-21 MED ORDER — POLYETHYLENE GLYCOL 3350 17 G PO PACK
17.0000 g | PACK | Freq: Every day | ORAL | Status: DC
  Administered 2020-02-21 – 2020-02-24 (×4): 17 g via ORAL
  Filled 2020-02-21 (×6): qty 1

## 2020-02-21 MED ORDER — DOCUSATE SODIUM 100 MG PO CAPS: 100.0000 mg | ORAL_CAPSULE | Freq: Every day | ORAL | Status: DC | PRN

## 2020-02-21 MED ORDER — PREDNISONE 20 MG PO TABS
40.0000 mg | ORAL_TABLET | Freq: Every day | ORAL | Status: DC
  Administered 2020-02-22 – 2020-02-24 (×3): 40 mg via ORAL
  Filled 2020-02-21 (×3): qty 2

## 2020-02-21 MED ORDER — AMIODARONE HCL IN DEXTROSE 360-4.14 MG/200ML-% IV SOLN
30.0000 mg/h | INTRAVENOUS | Status: DC
  Administered 2020-02-21: 59.94 mg/h via INTRAVENOUS
  Administered 2020-02-22: 30 mg/h via INTRAVENOUS
  Filled 2020-02-21 (×4): qty 200

## 2020-02-21 MED ORDER — AMIODARONE LOAD VIA INFUSION
150.0000 mg | Freq: Once | INTRAVENOUS | Status: AC
  Administered 2020-02-21: 150 mg via INTRAVENOUS
  Filled 2020-02-21: qty 83.34

## 2020-02-21 MED ORDER — AMIODARONE HCL IN DEXTROSE 360-4.14 MG/200ML-% IV SOLN
60.0000 mg/h | INTRAVENOUS | Status: DC
  Administered 2020-02-21: 60 mg/h via INTRAVENOUS
  Filled 2020-02-21: qty 200

## 2020-02-21 MED ORDER — ACETAMINOPHEN 325 MG PO TABS: 650.0000 mg | ORAL_TABLET | Freq: Four times a day (QID) | ORAL | Status: DC | PRN

## 2020-02-21 NOTE — Progress Notes (Signed)
Colletta Maryland, RN was given medications from patient's bin and BiPAP machine was transported also with patient.

## 2020-02-21 NOTE — Evaluation (Signed)
Occupational Therapy Evaluation Patient Details Name: Laura Gonzalez MRN: [id number] DOB: 1940/09/18 Today's Date: 02/21/2020    History of Present Illness pt is 78 yo female that presented to ED for SOB, initially placed on bipap.  with PMH of asthma, afib, skin cancer, CHF, GERD, intermittent use of O2 at home.   Clinical Impression   Patient presenting with decreased I in self care, balance, functional transfers/mobility, endurance, safety awareness, and strength Patient reports being mod I with use of electric scooter and three wheeled walker ( in apartment) PTA. Pt does go to a dining room for 1-2 meals daily. She has groceries delivered and does laundry in her room. She often reports LE pain and endorses not dressing LB if not going outside.  Patient currently functioning at mod A bed mobility, min A sit <>stand, min A functional transfer, set up A UB, and mod A anticipated for LB self care. Patient will benefit from acute OT to increase overall independence in the areas of ADLs, functional mobility, and safety awareness in order to safely discharge home with [facility] services to address functional deficits.    Follow Up Recommendations  Home health OT;Supervision/Assistance - 24 hour    Equipment Recommendations  None recommended by OT    Recommendations for Other Services Other (comment) (none at this time)     Precautions / Restrictions Precautions Precautions: Fall Precaution Comments: HR      Mobility Bed Mobility Overal bed mobility: Needs Assistance Bed Mobility: Supine to Sit     Supine to sit: Mod assist     General bed mobility comments: min cuing for hand placement  Transfers Overall transfer level: Needs assistance Equipment used: 1 person hand held assist Transfers: Stand Pivot Transfers;Sit to/from Stand Sit to Stand: Min assist Stand pivot transfers: Min assist       General transfer comment: HR elevated to 140's with transfer    Balance Overall  balance assessment: Needs assistance Sitting-balance support: Feet supported Sitting balance-Leahy Scale: Good     Standing balance support: During functional activity Standing balance-Leahy Scale: Fair Standing balance comment: reliance on UE support          ADL either performed or assessed with clinical judgement   ADL Overall ADL's : Needs assistance/impaired Eating/Feeding: Independent;Sitting   Grooming: Wash/dry hands;Wash/dry face;Oral care;Sitting;Set up   Upper Body Bathing: Set up;Sitting   Lower Body Bathing: Moderate assistance;Sit to/from stand   Upper Body Dressing : Set up;Sitting   Lower Body Dressing: Moderate assistance;Sit to/from stand               Functional mobility during ADLs: Minimal assistance;Cueing for sequencing General ADL Comments: Pt reports she often does not dress LB at home secondary to leg pain.     Vision Baseline Vision/History: No visual deficits              Pertinent Vitals/Pain Pain Assessment: No/denies pain     Hand Dominance Right   Extremity/Trunk Assessment Upper Extremity Assessment Upper Extremity Assessment: Generalized weakness   Lower Extremity Assessment Lower Extremity Assessment: Defer to PT evaluation       Communication Communication Communication: No difficulties   Cognition Arousal/Alertness: Awake/alert Behavior During Therapy: WFL for tasks assessed/performed Overall Cognitive Status: Within Functional Limits for tasks assessed                       Home Living Family/patient expects to be discharged to:: Assisted living Living Arrangements: Alone Available Help  at Discharge: Friend(s);Neighbor;Available PRN/intermittently Type of Home: Apartment Home Access: Elevator     Home Layout: One level     Bathroom Shower/Tub: Walk-in shower;Door   [employer] Toilet: Standard     Home Equipment: Clinical cytogeneticist - 4 wheels;Grab bars - tub/shower;Electric scooter;Other  (comment)   Additional Comments: 3 wheeled walker      Prior Functioning/Environment Level of Independence: Independent with assistive device(s)        Comments: Pt reports she is mod I with self care tasks. She goes to a dining room for 1-2 meal each day. No home O2. Shes does laundry in her room and facility will drop off groceries.        OT Problem List: Decreased strength;Decreased activity tolerance;Decreased safety awareness;Impaired balance (sitting and/or standing);Cardiopulmonary status limiting activity      OT Treatment/Interventions: Self-care/ADL training;Therapeutic exercise;Therapeutic activities;Energy conservation;DME and/or AE instruction;Balance training;Modalities    OT Goals(Current goals can be found in the care plan section) Acute Rehab OT Goals Patient Stated Goal: to go home OT Goal Formulation: With patient Time For Goal Achievement: 03/06/20 Potential to Achieve Goals: Good ADL Goals Pt Will Perform Grooming: with modified independence;standing Pt Will Transfer to Toilet: with modified independence;ambulating Pt Will Perform Toileting - Clothing Manipulation and hygiene: with modified independence;sit to/from stand Pt/caregiver will Perform Home Exercise Program: Increased strength;Both right and left upper extremity;Independently;With written HEP provided  OT Frequency: Min 1X/week   Barriers to D/C: Other (comment)  none at this time          AM-PAC OT "6 Clicks" Daily Activity     Outcome Measure Help from another person eating meals?: None Help from another person taking care of personal grooming?: A Little Help from another person toileting, which includes using toliet, bedpan, or urinal?: A Lot Help from another person bathing (including washing, rinsing, drying)?: A Lot Help from another person to put on and taking off regular upper body clothing?: A Little Help from another person to put on and taking off regular lower body clothing?: A  Lot 6 Click Score: 16   End of Session Equipment Utilized During Treatment: Oxygen (3L via )  Activity Tolerance: Patient limited by fatigue Patient left: in chair;with call bell/phone within reach;with bed alarm set;Other (comment) (PT arrived for evaluation)  OT Visit Diagnosis: Unsteadiness on feet (R26.81);Muscle weakness (generalized) (M62.81)                Time: 6834-1962 OT Time Calculation (min): 24 min Charges:  OT General Charges $OT Visit: 1 Visit OT Evaluation $OT Eval Low Complexity: 1 Low OT Treatments $Therapeutic Activity: 8-22 mins  Darleen Crocker, MS, OTR/L , CBIS ascom [phone]  02/21/20, 12:43 PM

## 2020-02-21 NOTE — Progress Notes (Signed)
Pt was transported to CCU for amlodipine drip infusion. Pt was received by Colletta Maryland, RN.  to room 256. Pt was transported via wheelchair by nurse and NT. Pt was transported with all of her belongings. Right PIV 22g remains in place.

## 2020-02-21 NOTE — TOC Initial Note (Signed)
Transition of Care [facility]) - Initial/Assessment Note    Patient Details  Name: Laura Gonzalez MRN: [id number] Date of Birth: 01/28/1941  [facility]) CM/SW Contact:    Shelbie Ammons, RN Phone Number: 02/21/2020, 2:57 PM  Clinical Narrative:     RNCM assessed patient in room, patient sitting up in chair. Discussed CM role and that therapy in the hospital is recommending she have home health when she returns home. Patient is in agreement with this and reports she has had [facility] and would be interested in having them back out. Patient reports that she has all necessary equipment in the home. Patient reports that she typically uses transportation as she very rarely drives anymore. She uses [facility] for her primary care and does get some assistance from [place].  RNCM reached out to [place] with [facility] to see if he can accept referral.            Expected Discharge Plan: [place] Barriers to Discharge: No Barriers Identified   Patient Goals and CMS Choice     Choice offered to / list presented to : Patient  Expected Discharge Plan and Services Expected Discharge Plan: [place] Acute Care Choice: [place] arrangements for the past 2 months: Apartment                                      Prior Living Arrangements/Services Living arrangements for the past 2 months: Apartment Lives with:: Self Patient language and need for interpreter reviewed:: Yes Do you feel safe going back to the place where you live?: Yes      Need for Family Participation in Patient Care: Yes (Comment) Care giver support system in place?: Yes (comment)   Criminal Activity/Legal Involvement Pertinent to Current Situation/Hospitalization: No - Comment as needed  Activities of Daily Living      Permission Sought/Granted                  Emotional Assessment Appearance:: Appears stated age Attitude/Demeanor/Rapport:  Engaged Affect (typically observed): Appropriate Orientation: : Oriented to Self, Oriented to Place, Oriented to  Time, Oriented to Situation Alcohol / Substance Use: Not Applicable Psych Involvement: No (comment)  Admission diagnosis:  Acute respiratory failure with hypoxia ([place]) [J96.01] Asthma exacerbation [J45.901] Atrial fibrillation with RVR ([facility]) [I48.91] Exacerbation of asthma, unspecified asthma severity, unspecified whether persistent [J45.901] Patient Active Problem List   Diagnosis Date Noted  . Asthma exacerbation 02/18/2020  . Acute respiratory failure with hypoxia ([place]) 07/06/2019  . Chest pain 12/29/2018  . Chronic diastolic heart failure ([place]) 04/22/2018  . Atrial fibrillation with RVR ([place]) 04/06/2018  . PAD (peripheral artery disease) ([place]) 09/22/2017  . 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 Hemarthrosis involving knee joint, right 08/17/2017  . Varicose veins of bilateral lower extremities with other complications 71/11/2692  . Chronic venous insufficiency 08/17/2017  . Right knee pain 07/25/2017  . Gait instability 07/23/2017  . Persistent atrial fibrillation ([place]) 12/11/2014  . HTN (hypertension) 12/11/2014  . GERD (gastroesophageal reflux disease) 12/11/2014  . Asthma 12/11/2014   PCP:  Baxter Hire, MD Pharmacy:   [facility] [place], [place] [address] [place] [address] Phone: [phone] Fax: [place] [address]  [place], [place] [address] [place] [address] Phone: [phone] Fax: [phone]  [place], [place] [address] [place] [address] Phone: [phone] Fax: [phone]     Social Determinants of Health (SDOH) Interventions    Readmission Risk Interventions No flowsheet data  found.
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
## 2020-02-21 NOTE — Evaluation (Signed)
Physical Therapy Evaluation Patient Details Name: Laura Gonzalez MRN: [id number] DOB: May 09, 1941 Today's Date: 02/21/2020   History of Present Illness  pt is 79 yo female that presented to ED for SOB, initially placed on bipap.  with PMH of asthma, afib, skin cancer, CHF, GERD, intermittent use of O2 at home.    Clinical Impression  Patient alert, in chair, OT at bedside. Pt hypervebal throughout session but endorsed modI at baseline, ambulates to dining hall for meals daily.   The patient demonstrated sit <> Stand with CGA, heavy use of UE noted. She was able to ambulate ~46ft total, RW and CGA. Exhibited SOB, HR in 150s low 160s, and spO2 87% on 3L, further mobility held due to these vitals. Pt reported she has experienced these problems/symptoms several times, and is adamant about going home.  Overall the patient demonstrated deficits (see "PT Problem List") that impede the patient's functional abilities, safety, and mobility and would benefit from skilled PT intervention. Recommendation is HHPT with intermittent supervision pending pt progress.     Follow Up Recommendations Home health PT;Supervision - Intermittent    Equipment Recommendations  None recommended by PT;Other (comment) (pt has all DME needed)    Recommendations for Other Services       Precautions / Restrictions Precautions Precautions: Fall Precaution Comments: HR Restrictions Weight Bearing Restrictions: No      Mobility  Bed Mobility Overal bed mobility: Needs Assistance Bed Mobility: Supine to Sit     Supine to sit: Mod assist     General bed mobility comments: pt up in chair at start and end of session  Transfers Overall transfer level: Needs assistance Equipment used: 1 person hand held assist Transfers: Sit to/from Stand Sit to Stand: Min guard Stand pivot transfers: Min assist       General transfer comment: heavy use of UEs to complete transfer  Ambulation/Gait   Gait Distance (Feet):  10 Feet         General Gait Details: HR up to high 150s, 162 BPM, further mobility deferred. spO2 87% on 3L, very SOB.  Stairs            Wheelchair Mobility    Modified Rankin (Stroke Patients Only)       Balance Overall balance assessment: Needs assistance Sitting-balance support: Feet supported Sitting balance-Leahy Scale: Good     Standing balance support: During functional activity Standing balance-Leahy Scale: Fair Standing balance comment: reliance on UE support                             Pertinent Vitals/Pain Pain Assessment: No/denies pain    Home Living Family/patient expects to be discharged to:: Assisted living Living Arrangements: Alone Available Help at Discharge: Friend(s);Neighbor;Available PRN/intermittently Type of Home: Apartment Home Access: Elevator     Home Layout: One level Home Equipment: Clinical cytogeneticist - 4 wheels;Grab bars - tub/shower;Electric scooter;Other (comment) Additional Comments: 3 wheeled walker    Prior Function Level of Independence: Independent with assistive device(s)         Comments: Pt reports she is mod I with self care tasks. She goes to a dining room for 1-2 meal each day. No home O2. Shes does laundry in her room and facility will drop off groceries.     Hand Dominance   Dominant Hand: Right    Extremity/Trunk Assessment   Upper Extremity Assessment Upper Extremity Assessment: Defer to OT evaluation    Lower  Extremity Assessment Lower Extremity Assessment: Generalized weakness    Cervical / Trunk Assessment Cervical / Trunk Assessment: Normal  Communication   Communication: No difficulties  Cognition Arousal/Alertness: Awake/alert Behavior During Therapy: WFL for tasks assessed/performed Overall Cognitive Status: Within Functional Limits for tasks assessed                                        General Comments      Exercises     Assessment/Plan    PT  Assessment Patient needs continued PT services  PT Problem List Decreased strength;Decreased range of motion;Decreased activity tolerance;Decreased balance;Decreased mobility;Cardiopulmonary status limiting activity;Pain       PT Treatment Interventions DME instruction;Therapeutic exercise;Gait training;Balance training;Stair training;Neuromuscular re-education;Functional mobility training;Therapeutic activities;Patient/family education    PT Goals (Current goals can be found in the Care Plan section)  Acute Rehab PT Goals Patient Stated Goal: to go home PT Goal Formulation: With patient Time For Goal Achievement: 03/06/20 Potential to Achieve Goals: Good    Frequency Min 2X/week   Barriers to discharge        Co-evaluation               AM-PAC PT "6 Clicks" Mobility  Outcome Measure Help needed turning from your back to your side while in a flat bed without using bedrails?: A Little Help needed moving from lying on your back to sitting on the side of a flat bed without using bedrails?: A Little Help needed moving to and from a bed to a chair (including a wheelchair)?: A Little Help needed standing up from a chair using your arms (e.g., wheelchair or bedside chair)?: A Little Help needed to walk in hospital room?: A Little Help needed climbing 3-5 steps with a railing? : A Lot 6 Click Score: 17    End of Session Equipment Utilized During Treatment: Gait belt;Oxygen (3L) Activity Tolerance: Patient tolerated treatment well Patient left: in chair;with chair alarm set;with call bell/phone within reach Nurse Communication: Mobility status PT Visit Diagnosis: Other abnormalities of gait and mobility (R26.89);Muscle weakness (generalized) (M62.81)    Time: 3734-2876 PT Time Calculation (min) (ACUTE ONLY): 24 min   Charges:   PT Evaluation $PT Eval Low Complexity: 1 Low PT Treatments $Therapeutic Exercise: 8-22 mins        Lieutenant Diego PT, DPT 2:24  PM,02/21/20

## 2020-02-21 NOTE — Progress Notes (Signed)
PROGRESS NOTE    Laura Gonzalez  ZOX:[id number] DOB: Feb 07, 1941 DOA: 02/18/2020 PCP: Laura Hire, MD    Brief Narrative:   Laura Gonzalez is a 79 y.o. female with medical history significant of chronic atrial fibrillation on Coumadin, chronic diastolic CHF, asthma on intermittent oxygen at home PRN, hypertension, hyperlipidemia, comes into the hospital with chief complaint of shortness of breath. Now with afib rvr too.     Consultants:     Procedures:   Antimicrobials:       Subjective: Still with shortness of breath.  She does report when her heart rate is up she becomes more short of breath.  No chest pain, dizziness or lightheadedness  Objective: Vitals:   02/21/20 0752 02/21/20 0915 02/21/20 1129 02/21/20 1325  BP:  (!) 122/54 132/87 137/70  Pulse:  (!) 123 80 92  Resp:  20 17 20   Temp:  98.4 F (36.9 C)  98.1 F (36.7 C)  TempSrc:  Oral    SpO2: 93% 93% 92% 96%  Weight:      Height:        Intake/Output Summary (Last 24 hours) at 02/21/2020 1638 Last data filed at 02/21/2020 1412 Gross per 24 hour  Intake 1080 ml  Output --  Net 1080 ml   Filed Weights   02/18/20 1808  Weight: 133.8 kg    Examination:  General exam: Appears calm and comfortable  Respiratory system: Clear to auscultation. Respiratory effort normal.  No wheezing Cardiovascular system: Regular/irregular, mildly tachycardic S1 & S2 heard,  No JVD, murmurs, rubs, gallops or clicks.  Gastrointestinal system: Abdomen is nondistended, soft and nontender. Normal bowel sounds heard. Central nervous system: Alert and oriented.  Grossly intact Extremities: No edema bilaterally Skin: Warm dry more foods healthy Psychiatry: Judgement and insight appear normal. Mood & affect appropriate.     Data Reviewed: I have personally reviewed following labs and imaging studies  CBC: Recent Labs  Lab 02/18/20 1805 02/19/20 0619 02/20/20 0202 02/21/20 0535  WBC 12.9* 13.4* 17.6* 15.5*  NEUTROABS  10.7*  --   --   --   HGB 13.1 13.4 14.0 14.8  HCT 40.9 42.0 43.6 45.9  MCV 94.0 93.5 93.0 93.9  PLT 231 190 231 811   Basic Metabolic Panel: Recent Labs  Lab 02/18/20 1805 02/19/20 0619 02/20/20 0202 02/21/20 0535  NA 139 139 139 141  K 4.9 3.6 3.8 3.8  CL 102 98 96* 97*  CO2 27 29 30 31   GLUCOSE 124* 196* 151* 230*  BUN 14 15 31* 40*  CREATININE 0.79 0.84 0.93 0.90  CALCIUM 8.9 8.4* 8.8* 9.7   GFR: Estimated Creatinine Clearance: 71.3 mL/min (by C-G formula based on SCr of 0.9 mg/dL). Liver Function Tests: Recent Labs  Lab 02/19/20 0619  AST 21  ALT 21  ALKPHOS 68  BILITOT 1.0  PROT 7.4  ALBUMIN 3.5   No results for input(s): LIPASE, AMYLASE in the last 168 hours. No results for input(s): AMMONIA in the last 168 hours. Coagulation Profile: Recent Labs  Lab 02/18/20 2002 02/19/20 9147 02/20/20 0202 02/21/20 0535  INR 4.7* 5.9* 5.2* 2.1*   Cardiac Enzymes: No results for input(s): CKTOTAL, CKMB, CKMBINDEX, TROPONINI in the last 168 hours. BNP (last 3 results) No results for input(s): PROBNP in the last 8760 hours. HbA1C: No results for input(s): HGBA1C in the last 72 hours. CBG: No results for input(s): GLUCAP in the last 168 hours. Lipid Profile: No results for input(s): CHOL, HDL,  LDLCALC, TRIG, CHOLHDL, LDLDIRECT in the last 72 hours. Thyroid Function Tests: No results for input(s): TSH, T4TOTAL, FREET4, T3FREE, THYROIDAB in the last 72 hours. Anemia Panel: No results for input(s): VITAMINB12, FOLATE, FERRITIN, TIBC, IRON, RETICCTPCT in the last 72 hours. Sepsis Labs: No results for input(s): PROCALCITON, LATICACIDVEN in the last 168 hours.  Recent Results (from the past 240 hour(s))  SARS Coronavirus 2 by RT PCR (hospital order, performed in [facility] hospital lab) Nasopharyngeal Nasopharyngeal Swab     Status: None   Collection Time: 02/18/20  8:39 PM   Specimen: Nasopharyngeal Swab  Result Value Ref Range Status   SARS Coronavirus 2 NEGATIVE  NEGATIVE Final    Comment: (NOTE) SARS-CoV-2 target nucleic acids are NOT DETECTED.  The SARS-CoV-2 RNA is generally detectable in upper and lower respiratory specimens during the acute phase of infection. The lowest concentration of SARS-CoV-2 viral copies this assay can detect is 250 copies / mL. A negative result does not preclude SARS-CoV-2 infection and should not be used as the sole basis for treatment or other patient management decisions.  A negative result may occur with improper specimen collection / handling, submission of specimen other than nasopharyngeal swab, presence of viral mutation(s) within the areas targeted by this assay, and inadequate number of viral copies (<250 copies / mL). A negative result must be combined with clinical observations, patient history, and epidemiological information.  Fact Sheet for Patients:   StrictlyIdeas.no  Fact Sheet for Healthcare Providers: BankingDealers.co.za  This test is not yet approved or  cleared by the [place] FDA and has been authorized for detection and/or diagnosis of SARS-CoV-2 by FDA under an Emergency Use Authorization (EUA).  This EUA will remain in effect (meaning this test can be used) for the duration of the COVID-19 declaration under Section 564(b)(1) of the Act, 21 U.S.C. section 360bbb-3(b)(1), unless the authorization is terminated or revoked sooner.  Performed at [facility], [address] [place], [place] [address]          Radiology Studies: No results found.      Scheduled Meds: . amiodarone  150 mg Intravenous Once  . budesonide (PULMICORT) nebulizer solution  0.25 mg Nebulization BID  . diltiazem  300 mg Oral Daily  . diltiazem  60 mg Oral QHS  . feeding supplement (ENSURE ENLIVE)  237 mL Oral TID  . furosemide  40 mg Intravenous Daily  . ipratropium-albuterol  3 mL Nebulization BID  . magnesium oxide  400 mg Oral BID    . metoprolol tartrate  50 mg Oral BID  . montelukast  10 mg Oral QHS  . polyethylene glycol  17 g Oral Daily  . potassium chloride  20 mEq Oral BID  . [START ON 02/22/2020] predniSONE  40 mg Oral Q breakfast  . Warfarin - Pharmacist Dosing Inpatient   Does not apply q1600   Continuous Infusions: . amiodarone     Followed by  . amiodarone      Assessment & Plan:   Active Problems:   Asthma exacerbation   Asthma exacerbation: No wheezing on exam.  Currently on IV steroids and will switch to tapered steroid p.o. starting tomorrow.   Continue bronchodilators and supplemental oxygen  Encourage incentive spirometer    Acute on chronic hypoxic respiratory failure: Likely secondary to asthma exacerbation  Weaned off of BiPAP and currently on nasal cannula likely secondary to asthma exacerbation.  Weaned off bipap, now on 3L  [place].  Wears 3L intermittent at  home prn.   A. Fib  w/ RVR. Likely RVR from asthma exacerbation. HR still not controlled. On coumadin, with inr therapeutic today On cardizem and beta blk.  Consult cardiology as pt likely needs iv amiodarone. Spoke to Dr. Ubaldo Glassing.   Supratherapeutic INR: INR 5.2...>2.1 today. Was given  s/p vitamin K x1. Pharmacy following for warfarin monitoring and dosing   Acute on chronic diastolic CHF: will need HR controlled to help with this.  Continue iv lasix.continue on IV lasix.   Leukocytosis: likely secondary to steroid use. Decreasing now. Will continue to monitor.  HTN: stable overall. Continue beta blk and cardizem for now.  Morbid obesity: BMI 49.0. Would benefit from significant weight loss.  Capital   DVT prophylaxis: coumadin Code Status:  Full  family Communication: None at bedside Disposition Plan: Home when stable Status is: Inpatient  Remains inpatient appropriate because:Hemodynamically unstable   Dispo: The patient is from: Home              Anticipated d/c is to: Home              Anticipated d/c date  is: 3 days              Patient currently is not medically stable to d/c.Currently HR not controlled and very symptomatic.             LOS: 2 days   Time spent: 45 min with >50% on coc    Nolberto Hanlon, MD [facility] Pager [phone]  If 7PM-7AM, please contact night-coverage [email or web address] Password [facility] 02/21/2020, 4:38 PM

## 2020-02-22 DIAGNOSIS — Z7901 Long term (current) use of anticoagulants: Secondary | ICD-10-CM

## 2020-02-22 DIAGNOSIS — I5033 Acute on chronic diastolic (congestive) heart failure: Secondary | ICD-10-CM

## 2020-02-22 LAB — GLUCOSE, CAPILLARY
Glucose-Capillary: 239 mg/dL — ABNORMAL HIGH (ref 70–99)
Glucose-Capillary: 178 mg/dL — ABNORMAL HIGH (ref 70–99)

## 2020-02-22 LAB — HEMOGLOBIN A1C
Hgb A1c MFr Bld: 6 % — ABNORMAL HIGH (ref 4.8–5.6)
Mean Plasma Glucose: 125.5 mg/dL

## 2020-02-22 LAB — PROTIME-INR
INR: 2.1 — ABNORMAL HIGH (ref 0.8–1.2)
Prothrombin Time: 23.1 seconds — ABNORMAL HIGH (ref 11.4–15.2)

## 2020-02-22 LAB — BRAIN NATRIURETIC PEPTIDE: B Natriuretic Peptide: 153.1 pg/mL — ABNORMAL HIGH (ref 0.0–100.0)

## 2020-02-22 MED ORDER — WARFARIN SODIUM 3 MG PO TABS
3.0000 mg | ORAL_TABLET | Freq: Once | ORAL | Status: AC
  Administered 2020-02-22: 3 mg via ORAL
  Filled 2020-02-22: qty 1

## 2020-02-22 MED ORDER — INSULIN ASPART 100 UNIT/ML ~~LOC~~ SOLN
0.0000 [IU] | Freq: Every day | SUBCUTANEOUS | Status: DC
  Administered 2020-02-24 – 2020-02-25 (×3): 2 [IU] via SUBCUTANEOUS
  Filled 2020-02-22 (×3): qty 1

## 2020-02-22 MED ORDER — AMIODARONE HCL 200 MG PO TABS
200.0000 mg | ORAL_TABLET | Freq: Two times a day (BID) | ORAL | Status: DC
  Administered 2020-02-22 – 2020-02-23 (×3): 200 mg via ORAL
  Filled 2020-02-22 (×3): qty 1

## 2020-02-22 MED ORDER — SODIUM CHLORIDE 0.9% FLUSH
3.0000 mL | Freq: Two times a day (BID) | INTRAVENOUS | Status: DC
  Administered 2020-02-22 – 2020-02-26 (×8): 3 mL via INTRAVENOUS

## 2020-02-22 MED ORDER — INSULIN ASPART 100 UNIT/ML ~~LOC~~ SOLN
0.0000 [IU] | Freq: Three times a day (TID) | SUBCUTANEOUS | Status: DC
  Administered 2020-02-22: 3 [IU] via SUBCUTANEOUS
  Administered 2020-02-23: 2 [IU] via SUBCUTANEOUS
  Administered 2020-02-23: 1 [IU] via SUBCUTANEOUS
  Administered 2020-02-23: 3 [IU] via SUBCUTANEOUS
  Administered 2020-02-24: 2 [IU] via SUBCUTANEOUS
  Administered 2020-02-24 (×2): 1 [IU] via SUBCUTANEOUS
  Administered 2020-02-25: 5 [IU] via SUBCUTANEOUS
  Administered 2020-02-26: 2 [IU] via SUBCUTANEOUS
  Filled 2020-02-22 (×9): qty 1

## 2020-02-22 NOTE — Progress Notes (Signed)
PROGRESS NOTE    Laura Gonzalez  QVZ:[id number] DOB: [birth year] DOA: 02/18/2020 PCP: Baxter Hire, MD    Brief Narrative:   Laura Gonzalez is a 79 y.o. female with medical history significant of chronic atrial fibrillation on Coumadin, chronic diastolic CHF, asthma on intermittent oxygen at home PRN, hypertension, hyperlipidemia, comes into the hospital with chief complaint of shortness of breath. Now with afib rvr too.     Consultants:   Cardio  Procedures:   Antimicrobials:       Subjective: Today.  Less short better control.  On telemetry heart rate controlled  Objective: Vitals:   02/22/20 0431 02/22/20 1009 02/22/20 1047 02/22/20 1621  BP: 127/78  122/61 128/62  Pulse: 83  81 77  Resp: 17   19  Temp: 98.4 F (36.9 C)  97.8 F (36.6 C) 97.6 F (36.4 C)  TempSrc:    Oral  SpO2: 94% 97% 99% 98%  Weight:      Height:        Intake/Output Summary (Last 24 hours) at 02/22/2020 1730 Last data filed at 02/22/2020 1350 Gross per 24 hour  Intake 1012.81 ml  Output 1400 ml  Net -387.19 ml   Filed Weights   02/18/20 1808 02/21/20 1852  Weight: 133.8 kg 128.4 kg    Examination:  General exam: Calm, not tachypneic, NAD Respiratory system:+fine rales b/l Cardiovascular system: Irregular, rate controlled, no murmurs S1-S2  gastrointestinal system: soft, nt/nd +bs Central nervous system: Grossly intact.  AAO times O x3 Extremities: trace b/l edema Skin: Warm dry Psychiatry: Mood and affect appropriate   Data Reviewed: I have personally reviewed following labs and imaging studies  CBC: Recent Labs  Lab 02/18/20 1805 02/19/20 0619 02/20/20 0202 02/21/20 0535  WBC 12.9* 13.4* 17.6* 15.5*  NEUTROABS 10.7*  --   --   --   HGB 13.1 13.4 14.0 14.8  HCT 40.9 42.0 43.6 45.9  MCV 94.0 93.5 93.0 93.9  PLT 231 190 231 329   Basic Metabolic Panel: Recent Labs  Lab 02/18/20 1805 02/19/20 0619 02/20/20 0202 02/21/20 0535  NA 139 139 139 141  K 4.9 3.6  3.8 3.8  CL 102 98 96* 97*  CO2 27 29 30 31   GLUCOSE 124* 196* 151* 230*  BUN 14 15 31* 40*  CREATININE 0.79 0.84 0.93 0.90  CALCIUM 8.9 8.4* 8.8* 9.7   GFR: Estimated Creatinine Clearance: 69.6 mL/min (by C-G formula based on SCr of 0.9 mg/dL). Liver Function Tests: Recent Labs  Lab 02/19/20 0619  AST 21  ALT 21  ALKPHOS 68  BILITOT 1.0  PROT 7.4  ALBUMIN 3.5   No results for input(s): LIPASE, AMYLASE in the last 168 hours. No results for input(s): AMMONIA in the last 168 hours. Coagulation Profile: Recent Labs  Lab 02/18/20 2002 02/19/20 5188 02/20/20 0202 02/21/20 0535 02/22/20 0459  INR 4.7* 5.9* 5.2* 2.1* 2.1*   Cardiac Enzymes: No results for input(s): CKTOTAL, CKMB, CKMBINDEX, TROPONINI in the last 168 hours. BNP (last 3 results) No results for input(s): PROBNP in the last 8760 hours. HbA1C: No results for input(s): HGBA1C in the last 72 hours. CBG: Recent Labs  Lab 02/22/20 1724  GLUCAP 239*   Lipid Profile: No results for input(s): CHOL, HDL, LDLCALC, TRIG, CHOLHDL, LDLDIRECT in the last 72 hours. Thyroid Function Tests: No results for input(s): TSH, T4TOTAL, FREET4, T3FREE, THYROIDAB in the last 72 hours. Anemia Panel: No results for input(s): VITAMINB12, FOLATE, FERRITIN, TIBC, IRON, RETICCTPCT in  the last 72 hours. Sepsis Labs: No results for input(s): PROCALCITON, LATICACIDVEN in the last 168 hours.  Recent Results (from the past 240 hour(s))  SARS Coronavirus 2 by RT PCR (hospital order, performed in [facility] hospital lab) Nasopharyngeal Nasopharyngeal Swab     Status: None   Collection Time: 02/18/20  8:39 PM   Specimen: Nasopharyngeal Swab  Result Value Ref Range Status   SARS Coronavirus 2 NEGATIVE NEGATIVE Final    Comment: (NOTE) SARS-CoV-2 target nucleic acids are NOT DETECTED.  The SARS-CoV-2 RNA is generally detectable in upper and lower respiratory specimens during the acute phase of infection. The lowest concentration of  SARS-CoV-2 viral copies this assay can detect is 250 copies / mL. A negative result does not preclude SARS-CoV-2 infection and should not be used as the sole basis for treatment or other patient management decisions.  A negative result may occur with improper specimen collection / handling, submission of specimen other than nasopharyngeal swab, presence of viral mutation(s) within the areas targeted by this assay, and inadequate number of viral copies (<250 copies / mL). A negative result must be combined with clinical observations, patient history, and epidemiological information.  Fact Sheet for Patients:   StrictlyIdeas.no  Fact Sheet for Healthcare Providers: BankingDealers.co.za  This test is not yet approved or  cleared by the [place] FDA and has been authorized for detection and/or diagnosis of SARS-CoV-2 by FDA under an Emergency Use Authorization (EUA).  This EUA will remain in effect (meaning this test can be used) for the duration of the COVID-19 declaration under Section 564(b)(1) of the Act, 21 U.S.C. section 360bbb-3(b)(1), unless the authorization is terminated or revoked sooner.  Performed at [facility], [address] [place], [place] [address]          Radiology Studies: No results found.      Scheduled Meds: . amiodarone  200 mg Oral BID  . budesonide (PULMICORT) nebulizer solution  0.25 mg Nebulization BID  . diltiazem  300 mg Oral Daily  . diltiazem  60 mg Oral QHS  . feeding supplement (ENSURE ENLIVE)  237 mL Oral TID  . furosemide  40 mg Intravenous Daily  . insulin aspart  0-5 Units Subcutaneous QHS  . insulin aspart  0-9 Units Subcutaneous TID WC  . ipratropium-albuterol  3 mL Nebulization BID  . magnesium oxide  400 mg Oral BID  . metoprolol tartrate  50 mg Oral BID  . montelukast  10 mg Oral QHS  . polyethylene glycol  17 g Oral Daily  . potassium chloride  20 mEq Oral BID   . predniSONE  40 mg Oral Q breakfast  . Warfarin - Pharmacist Dosing Inpatient   Does not apply q1600   Continuous Infusions:   Assessment & Plan:   Active Problems:   Asthma exacerbation   Asthma exacerbation: Improving Started p.o. steroid intake today.  DC IV steroids Continue bronchodilators and supplemental oxygen to keep O2 sats above 90% Continue incentive spirometer   Acute on chronic hypoxic respiratory failure: Likely secondary to asthma exacerbation  Weaned off of BiPAP and currently on nasal cannula likely secondary to asthma exacerbation.  Wears 3L intermittent at home prn. Weaned off bipap,was on 3L , now up at 4L . Continue iv steroid to po today Try to wean down on 02.    A. Fib  w/ RVR. Likely RVR from asthma exacerbation.  On cardizem and beta blk.  9/2-heart rate better controlled on amiodarone drip Per  cards amiodarone drip will be switched to p.o. 200 mg twice daily On Coumadin and INR is 2.1 today Cardiology following   Supratherapeutic INR: INR 5.2...>2.1 today. Was given  s/p vitamin K x1. Pharmacy following for warfarin monitoring and dosing   Acute on chronic diastolic CHF: Mildly volume overloaded.  Likely from A. fib RVR.  Now heart rate is better controlled.   BNP mildly elevated however likely underestimated in this obese patient  will need HR controlled to help with this.  Continue IV Lasix  Leukocytosis: likely secondary to steroid use Continues to decrease we will continue to monitor   HTN: Stable continue beta-blockers and Cardizem  Morbid obesity: BMI 49.0. Would benefit from significant weight loss.  Capital   DVT prophylaxis: coumadin Code Status:  Full  family Communication: None at bedside Status is: Inpatient  Remains inpatient appropriate because:requiring IV medications.   Dispo: The patient is from: Home              Anticipated d/c is to: Home              Anticipated d/c date is:2               Patient currently is not medically stable to d/c. currently on lasix IV as pt is volume overloaded. Switch amiodarone iv to po today and need to see if HR remains stable on po           LOS: 3 days   Time spent: 45 min with >50% on coc    Nolberto Hanlon, MD [facility] Pager [phone]  If 7PM-7AM, please contact night-coverage [email or web address] Password [email or web address] 02/22/2020, 5:30 PM

## 2020-02-22 NOTE — Progress Notes (Signed)
PT Cancellation Note  Patient Details Name: Laura Gonzalez MRN: [id number] DOB: 25-Jul-1940   Cancelled Treatment:    Reason Eval/Treat Not Completed: Other (comment) (Chart consulted, pt transferred to 256 to initiate new medication via IV infusion. Per PT guidelines new PT order needed due to transfer to higher level of care, RN notified of need for continue at transfer orders/new PT orders.   Lieutenant Diego PT, DPT 1:04 PM,02/22/20

## 2020-02-22 NOTE — Progress Notes (Signed)
Patient Name: Laura Gonzalez Date of Encounter: 02/22/2020  Hospital Problem List     Active Problems:   Asthma exacerbation    Patient Profile     79 y.o. female with history of paroxysmal atrial fibrillation, anticoagulated with warfarin, obstructive sleep apnea, compliant with CPAP, hypercholesterolemia, hypertension, and obesity who was admittted after presenting to the er with complaints of sob, wheezing. She was noted to have ecurrent afib with rvr. EKG showed afib with variable but mostly rvr. BNP was minimally elevated and she was covid negative. CXR showed mild interstital edema. She was placed on diltiazem drip with some improvement but still was having breakthrough rvr Episodes. Her inr was 4.7.She denies chest pain. She is on metoprolol and diltiazem at home.   Subjective   Feels less sob today. Heart rate improved.   Inpatient Medications    . amiodarone  200 mg Oral BID  . budesonide (PULMICORT) nebulizer solution  0.25 mg Nebulization BID  . diltiazem  300 mg Oral Daily  . diltiazem  60 mg Oral QHS  . feeding supplement (ENSURE ENLIVE)  237 mL Oral TID  . furosemide  40 mg Intravenous Daily  . ipratropium-albuterol  3 mL Nebulization BID  . magnesium oxide  400 mg Oral BID  . metoprolol tartrate  50 mg Oral BID  . montelukast  10 mg Oral QHS  . polyethylene glycol  17 g Oral Daily  . potassium chloride  20 mEq Oral BID  . predniSONE  40 mg Oral Q breakfast  . warfarin  3 mg Oral ONCE-1600  . Warfarin - Pharmacist Dosing Inpatient   Does not apply q1600    Vital Signs    Vitals:   02/22/20 0132 02/22/20 0431 02/22/20 1009 02/22/20 1047  BP: 120/68 127/78  122/61  Pulse: 82 83  81  Resp:  17    Temp: 97.9 F (36.6 C) 98.4 F (36.9 C)  97.8 F (36.6 C)  TempSrc: Oral     SpO2: 96% 94% 97% 99%  Weight:      Height:        Intake/Output Summary (Last 24 hours) at 02/22/2020 1343 Last data filed at 02/22/2020 1044 Gross per 24 hour  Intake 1012.81 ml   Output 1400 ml  Net -387.19 ml   Filed Weights   02/18/20 1808 02/21/20 1852  Weight: 133.8 kg 128.4 kg    Physical Exam    GEN: Well nourished, well developed, in no acute distress.  HEENT: normal.  Neck: Supple, no JVD, carotid bruits, or masses. Cardiac: irr, irr Respiratory:  Respirations regular and unlabored,bilateral rhonchi GI: Soft, nontender, nondistended, BS + x 4. MS: no deformity or atrophy. Skin: warm and dry,arhythema on left forearm at old iv site.  Neuro:  Strength and sensation are intact. Psych: Normal affect.  Labs    CBC Recent Labs    02/20/20 0202 02/21/20 0535  WBC 17.6* 15.5*  HGB 14.0 14.8  HCT 43.6 45.9  MCV 93.0 93.9  PLT 231 629   Basic Metabolic Panel Recent Labs    02/20/20 0202 02/21/20 0535  NA 139 141  K 3.8 3.8  CL 96* 97*  CO2 30 31  GLUCOSE 151* 230*  BUN 31* 40*  CREATININE 0.93 0.90  CALCIUM 8.8* 9.7   Liver Function Tests No results for input(s): AST, ALT, ALKPHOS, BILITOT, PROT, ALBUMIN in the last 72 hours. No results for input(s): LIPASE, AMYLASE in the last 72 hours. Cardiac Enzymes No results  for input(s): CKTOTAL, CKMB, CKMBINDEX, TROPONINI in the last 72 hours. BNP Recent Labs    02/22/20 0459  BNP 153.1*   D-Dimer No results for input(s): DDIMER in the last 72 hours. Hemoglobin A1C No results for input(s): HGBA1C in the last 72 hours. Fasting Lipid Panel No results for input(s): CHOL, HDL, LDLCALC, TRIG, CHOLHDL, LDLDIRECT in the last 72 hours. Thyroid Function Tests No results for input(s): TSH, T4TOTAL, T3FREE, THYROIDAB in the last 72 hours.  Invalid input(s): FREET3  Telemetry    afib with vr in the 80-90 range  ECG    afib with rvr  Radiology    DG Chest 2 View  Result Date: 02/18/2020 CLINICAL DATA:  Shortness of breath EXAM: CHEST - 2 VIEW COMPARISON:  July 06, 2019 FINDINGS: The heart size and mediastinal contour are unchanged with cardiomegaly. There is prominence of the  central pulmonary vasculature with mildly increased interstitial markings. There is blunting of the left costophrenic angle, likely layering effusion. IMPRESSION: Cardiomegaly and mild interstitial edema. Probable small left layering pleural effusion. Electronically Signed   By: Prudencio Pair M.D.   On: 02/18/2020 19:12    Assessment & Plan    79 yo female with history of paroxysmal atrial fibrillation, anticoagulated with warfarin, obstructive sleep apnea, compliant with CPAP, hypercholesterolemia, hypertension, and obesity who was admitted with afib with rvr.   1. afib-will load with amiodarone iv and follow for improvement. Will discontinue iv amiodarone and place on amiodarone 200 bid.  Continue with warfarin with inr goal of 2-3.  2.Diastollic chf-minimal fluid overload as evidenced by cxr and bnp. Careful diuresis. Appears somewhat volume overloaded today. Still on [place] oxygen which she is not on at home  3.HTN-will follow pressure on current meds.   4.Arm rash-secondary to iv. Follow wbc and site.     Signed, Javier Docker Marveline Profeta MD 02/22/2020, 1:43 PM  Pager: [phone]

## 2020-02-22 NOTE — Care Management Important Message (Signed)
Important Message  Patient Details  Name: Laura Gonzalez MRN: [id number] Date of Birth: 1941/03/15   Medicare Important Message Given:  Yes     [name] 02/22/2020, 1:56 PM
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
## 2020-02-22 NOTE — Progress Notes (Signed)
OT Cancellation Note  Patient Details Name: Laura Gonzalez MRN: [id number] DOB: April 03, 1941   Cancelled Treatment:    Reason Eval/Treat Not Completed: Other (comment). Chart consulted, pt transferred to 256 to initiate new medication via IV infusion. Per OT guidelines new OT order needed due to transfer to higher level of care, RN notified of need for continue at transfer orders/new OT orders.  Jeni Salles, MPH, MS, OTR/L ascom [phone] 02/22/20, 1:06 PM

## 2020-02-23 DIAGNOSIS — D689 Coagulation defect, unspecified: Secondary | ICD-10-CM

## 2020-02-23 LAB — BASIC METABOLIC PANEL
Anion gap: 10 (ref 5–15)
BUN: 40 mg/dL — ABNORMAL HIGH (ref 8–23)
CO2: 33 mmol/L — ABNORMAL HIGH (ref 22–32)
Calcium: 9.2 mg/dL (ref 8.9–10.3)
Chloride: 95 mmol/L — ABNORMAL LOW (ref 98–111)
Creatinine, Ser: 0.67 mg/dL (ref 0.44–1.00)
GFR calc Af Amer: >60 mL/min (ref >60)
GFR calc non Af Amer: >60 mL/min (ref >60)
Glucose, Bld: 155 mg/dL — ABNORMAL HIGH (ref 70–99)
Potassium: 4.1 mmol/L (ref 3.5–5.1)
Sodium: 138 mmol/L (ref 135–145)

## 2020-02-23 LAB — GLUCOSE, CAPILLARY
Glucose-Capillary: 125 mg/dL — ABNORMAL HIGH (ref 70–99)
Glucose-Capillary: 214 mg/dL — ABNORMAL HIGH (ref 70–99)
Glucose-Capillary: 183 mg/dL — ABNORMAL HIGH (ref 70–99)
Glucose-Capillary: 222 mg/dL — ABNORMAL HIGH (ref 70–99)

## 2020-02-23 LAB — CBC
HCT: 42.3 % (ref 36.0–46.0)
Hemoglobin: 13.7 g/dL (ref 12.0–15.0)
MCH: 29.5 pg (ref 26.0–34.0)
MCHC: 32.4 g/dL (ref 30.0–36.0)
MCV: 91 fL (ref 80.0–100.0)
Platelets: 239 10*3/uL (ref 150–400)
RBC: 4.65 MIL/uL (ref 3.87–5.11)
RDW: 14.5 % (ref 11.5–15.5)
WBC: 12.6 10*3/uL — ABNORMAL HIGH (ref 4.0–10.5)
nRBC: 0 % (ref 0.0–0.2)

## 2020-02-23 LAB — PROTIME-INR
INR: 2.3 — ABNORMAL HIGH (ref 0.8–1.2)
Prothrombin Time: 24.1 seconds — ABNORMAL HIGH (ref 11.4–15.2)

## 2020-02-23 MED ORDER — WARFARIN SODIUM 3 MG PO TABS
3.0000 mg | ORAL_TABLET | Freq: Once | ORAL | Status: AC
  Administered 2020-02-23: 3 mg via ORAL
  Filled 2020-02-23: qty 1

## 2020-02-23 MED ORDER — AMIODARONE HCL 200 MG PO TABS
400.0000 mg | ORAL_TABLET | Freq: Two times a day (BID) | ORAL | Status: DC
  Administered 2020-02-24 (×3): 400 mg via ORAL
  Filled 2020-02-23 (×3): qty 2

## 2020-02-23 MED ORDER — FUROSEMIDE 40 MG PO TABS
40.0000 mg | ORAL_TABLET | Freq: Every day | ORAL | Status: DC
  Administered 2020-02-23 – 2020-02-24 (×2): 40 mg via ORAL
  Filled 2020-02-23 (×2): qty 1

## 2020-02-23 MED ORDER — AMIODARONE HCL 200 MG PO TABS
200.0000 mg | ORAL_TABLET | Freq: Once | ORAL | Status: AC
  Administered 2020-02-23: 200 mg via ORAL
  Filled 2020-02-23: qty 1

## 2020-02-23 NOTE — Progress Notes (Addendum)
Physical Therapy Re-Evaluation Patient Details Name: Laura Gonzalez MRN: [id number] DOB: 06/28/1940 Today's Date: 02/23/2020    History of Present Illness pt is 79 yo female that presented to ED for SOB, initially placed on bipap.  with PMH of asthma, afib, skin cancer, CHF, GERD, intermittent use of O2 at home.    PT Comments    Pt alert, agreeable to PT. Demonstrated bed mobility modI, and sit <> stand transfers with RW modI (one instance of hand placement reminder, good carryover throughout remainder of session). The patient ambulated several steps to [facility] with RW and modI, able to utilize with supervision and perform wiping modI. She was able to ambulate ~71ft in total with RW and modI, HR and spO2 monitored. HR in 120s with mobility and spO2>90% on 2L, pt with significantly decreased SOB compared to previous session. Pt up in chair with all needs in reach. The patient would benefit from further skilled PT intervention to continue to progress towards goals. Recommendation remains appropriate.       Follow Up Recommendations  Home health PT;Supervision - Intermittent     Equipment Recommendations  None recommended by PT;Other (comment)    Recommendations for Other Services       Precautions / Restrictions Precautions Precautions: Fall Precaution Comments: HR Restrictions Weight Bearing Restrictions: No    Mobility  Bed Mobility Overal bed mobility: Modified Independent Bed Mobility: Supine to Sit     Supine to sit: HOB elevated     General bed mobility comments: heavy use of bed rails but no physical assist from PT needed  Transfers Overall transfer level: Modified independent Equipment used: Rolling walker (2 wheeled) Transfers: Sit to/from Stand Sit to Stand: Modified independent (Device/Increase time)         General transfer comment: reminder of hand placement once during session to improve safety, good carryover of education  Ambulation/Gait   Gait  Distance (Feet): 25 Feet Assistive device: Rolling walker (2 wheeled)       General Gait Details: Hr up to 120s, spO2 >90% on 2L throughout mobility, significantly less SOB noted compared to previous session   Stairs             Wheelchair Mobility    Modified Rankin (Stroke Patients Only)       Balance Overall balance assessment: Needs assistance Sitting-balance support: Feet supported Sitting balance-Leahy Scale: Good       Standing balance-Leahy Scale: Good Standing balance comment: able to stand, reach, and wipe after voiding on BSC                            Cognition Arousal/Alertness: Awake/alert Behavior During Therapy: WFL for tasks assessed/performed Overall Cognitive Status: Within Functional Limits for tasks assessed                                        Exercises Other Exercises Other Exercises: pt able to use BSC with supervision, and wipe after voiding modI.    General Comments        Pertinent Vitals/Pain Pain Assessment: No/denies pain    Home Living                      Prior Function            PT Goals (current goals can now be found in  the care plan section) Progress towards PT goals: Progressing toward goals    Frequency    Min 2X/week      PT Plan Current plan remains appropriate    Co-evaluation              AM-PAC PT "6 Clicks" Mobility   Outcome Measure  Help needed turning from your back to your side while in a flat bed without using bedrails?: A Little Help needed moving from lying on your back to sitting on the side of a flat bed without using bedrails?: A Little Help needed moving to and from a bed to a chair (including a wheelchair)?: A Little Help needed standing up from a chair using your arms (e.g., wheelchair or bedside chair)?: A Little Help needed to walk in hospital room?: A Little Help needed climbing 3-5 steps with a railing? : A Lot 6 Click Score:  17    End of Session Equipment Utilized During Treatment: Gait belt;Oxygen (2L) Activity Tolerance: Patient tolerated treatment well Patient left: in chair;with chair alarm set;with call bell/phone within reach Nurse Communication: Mobility status PT Visit Diagnosis: Other abnormalities of gait and mobility (R26.89);Muscle weakness (generalized) (M62.81)     Time: 2633-3545 PT Time Calculation (min) (ACUTE ONLY): 11 min  Charges:    Re-evaluation                     Lieutenant Diego PT, DPT 10:31 AM,02/23/20

## 2020-02-23 NOTE — Progress Notes (Signed)
PROGRESS NOTE    MARSI TURVEY  WNI:[id number] DOB: 1940/10/16 DOA: 02/18/2020 PCP: Baxter Hire, MD    Brief Narrative:   Laura Gonzalez is a 79 y.o. female with medical history significant of chronic atrial fibrillation on Coumadin, chronic diastolic CHF, asthma on intermittent oxygen at home PRN, hypertension, hyperlipidemia, comes into the hospital with chief complaint of shortness of breath. Now with afib rvr too.     Consultants:   Cardio  Procedures:   Antimicrobials:       Subjective: Patient states feels better.  Less shortness of breath.  However with physical therapy her heart rate on ambulation increased to more than 120.  Objective: Vitals:   02/23/20 1014 02/23/20 1027 02/23/20 1130 02/23/20 1515  BP:   (!) 106/56 106/65  Pulse:   73 76  Resp:   18 18  Temp:   97.8 F (36.6 C) 98 F (36.7 C)  TempSrc:   Oral Oral  SpO2: 96% 94% 93% 94%  Weight:      Height:        Intake/Output Summary (Last 24 hours) at 02/23/2020 1833 Last data filed at 02/23/2020 1747 Gross per 24 hour  Intake 720 ml  Output 1800 ml  Net -1080 ml   Filed Weights   02/18/20 1808 02/21/20 1852 02/23/20 0449  Weight: 133.8 kg 128.4 kg 128.5 kg    Examination:  General exam: Sitting in chair, NAD Respiratory system: Clear to auscultation, no wheeze rales rhonchi's Cardiovascular system: Irregular, no murmurs S1-S2 gastrointestinal system: Obese, soft nontender nondistended positive bowel sounds Central nervous system: AAx O x3, grossly intact Extremities: Less edema Skin: Warm dry Psychiatry: Mood and affect appropriate for current setting   Data Reviewed: I have personally reviewed following labs and imaging studies  CBC: Recent Labs  Lab 02/18/20 1805 02/19/20 0619 02/20/20 0202 02/21/20 0535 02/23/20 0352  WBC 12.9* 13.4* 17.6* 15.5* 12.6*  NEUTROABS 10.7*  --   --   --   --   HGB 13.1 13.4 14.0 14.8 13.7  HCT 40.9 42.0 43.6 45.9 42.3  MCV 94.0 93.5 93.0  93.9 91.0  PLT 231 190 231 266 381   Basic Metabolic Panel: Recent Labs  Lab 02/18/20 1805 02/19/20 0619 02/20/20 0202 02/21/20 0535 02/23/20 0352  NA 139 139 139 141 138  K 4.9 3.6 3.8 3.8 4.1  CL 102 98 96* 97* 95*  CO2 27 29 30 31  33*  GLUCOSE 124* 196* 151* 230* 155*  BUN 14 15 31* 40* 40*  CREATININE 0.79 0.84 0.93 0.90 0.67  CALCIUM 8.9 8.4* 8.8* 9.7 9.2   GFR: Estimated Creatinine Clearance: 78.3 mL/min (by C-G formula based on SCr of 0.67 mg/dL). Liver Function Tests: Recent Labs  Lab 02/19/20 0619  AST 21  ALT 21  ALKPHOS 68  BILITOT 1.0  PROT 7.4  ALBUMIN 3.5   No results for input(s): LIPASE, AMYLASE in the last 168 hours. No results for input(s): AMMONIA in the last 168 hours. Coagulation Profile: Recent Labs  Lab 02/19/20 0619 02/20/20 0202 02/21/20 0535 02/22/20 0459 02/23/20 0352  INR 5.9* 5.2* 2.1* 2.1* 2.3*   Cardiac Enzymes: No results for input(s): CKTOTAL, CKMB, CKMBINDEX, TROPONINI in the last 168 hours. BNP (last 3 results) No results for input(s): PROBNP in the last 8760 hours. HbA1C: Recent Labs    02/22/20 0459  HGBA1C 6.0*   CBG: Recent Labs  Lab 02/22/20 1724 02/22/20 2137 02/23/20 0756 02/23/20 1129 02/23/20 1631  GLUCAP  239* 178* 125* 214* 183*   Lipid Profile: No results for input(s): CHOL, HDL, LDLCALC, TRIG, CHOLHDL, LDLDIRECT in the last 72 hours. Thyroid Function Tests: No results for input(s): TSH, T4TOTAL, FREET4, T3FREE, THYROIDAB in the last 72 hours. Anemia Panel: No results for input(s): VITAMINB12, FOLATE, FERRITIN, TIBC, IRON, RETICCTPCT in the last 72 hours. Sepsis Labs: No results for input(s): PROCALCITON, LATICACIDVEN in the last 168 hours.  Recent Results (from the past 240 hour(s))  SARS Coronavirus 2 by RT PCR (hospital order, performed in [facility] hospital lab) Nasopharyngeal Nasopharyngeal Swab     Status: None   Collection Time: 02/18/20  8:39 PM   Specimen: Nasopharyngeal Swab  Result  Value Ref Range Status   SARS Coronavirus 2 NEGATIVE NEGATIVE Final    Comment: (NOTE) SARS-CoV-2 target nucleic acids are NOT DETECTED.  The SARS-CoV-2 RNA is generally detectable in upper and lower respiratory specimens during the acute phase of infection. The lowest concentration of SARS-CoV-2 viral copies this assay can detect is 250 copies / mL. A negative result does not preclude SARS-CoV-2 infection and should not be used as the sole basis for treatment or other patient management decisions.  A negative result may occur with improper specimen collection / handling, submission of specimen other than nasopharyngeal swab, presence of viral mutation(s) within the areas targeted by this assay, and inadequate number of viral copies (<250 copies / mL). A negative result must be combined with clinical observations, patient history, and epidemiological information.  Fact Sheet for Patients:   StrictlyIdeas.no  Fact Sheet for Healthcare Providers: BankingDealers.co.za  This test is not yet approved or  cleared by the [place] FDA and has been authorized for detection and/or diagnosis of SARS-CoV-2 by FDA under an Emergency Use Authorization (EUA).  This EUA will remain in effect (meaning this test can be used) for the duration of the COVID-19 declaration under Section 564(b)(1) of the Act, 21 U.S.C. section 360bbb-3(b)(1), unless the authorization is terminated or revoked sooner.  Performed at [facility], [address], [place], [place] [address]          Radiology Studies: No results found.      Scheduled Meds: . amiodarone  400 mg Oral BID  . budesonide (PULMICORT) nebulizer solution  0.25 mg Nebulization BID  . diltiazem  300 mg Oral Daily  . diltiazem  60 mg Oral QHS  . feeding supplement (ENSURE ENLIVE)  237 mL Oral TID  . furosemide  40 mg Oral Daily  . insulin aspart  0-5 Units Subcutaneous QHS    . insulin aspart  0-9 Units Subcutaneous TID WC  . ipratropium-albuterol  3 mL Nebulization BID  . magnesium oxide  400 mg Oral BID  . metoprolol tartrate  50 mg Oral BID  . montelukast  10 mg Oral QHS  . polyethylene glycol  17 g Oral Daily  . potassium chloride  20 mEq Oral BID  . predniSONE  40 mg Oral Q breakfast  . sodium chloride flush  3 mL Intravenous Q12H  . Warfarin - Pharmacist Dosing Inpatient   Does not apply q1600   Continuous Infusions:   Assessment & Plan:   Active Problems:   Asthma exacerbation   Asthma exacerbation: Improved Started on p.o. steroid taper  IV steroids was discontinued  Continue bronchodilators, keep oxygen sat above 90%  Incentive spirometer     Acute on chronic hypoxic respiratory failure: Likely secondary to asthma exacerbation with some acute on chronic diastolic heart failure.  Weaned off of BiPAP and currently on nasal cannula likely secondary to asthma exacerbation.  Now on ambulation on room air satting 94%, does not require oxygen     A. Fib  w/ RVR. Likely RVR from asthma exacerbation.  On cardizem and beta blk.  9/2-heart rate better controlled on amiodarone drip, started on po amidarone 200 mg  twice daily 9/3 rate was controlled, however with ambulation patient's heart rate increased to 120s.  Discussed with cardiology, will increase amiodarone to 400 mg twice daily On Coumadin and INR is therapeutic at 2.3 Will reevaluate heart rate on ambulation in a.m. to see if better controlled on higher dose of amiodarone    Supratherapeutic INR: INR 5.2.Marland KitchenMarland Kitchen>down to 2's. Was given  s/p vitamin K x1.   INR today is 2.3, dosing per pharmacy    Acute on chronic diastolic CHF: Was volume overloaded due to A. fib RVR.  Was given IV Lasix with improvement of volume status.   Now more euvolemic with better heart rate control.   Need to continue controlling heart rate to prevent her from going into acute decompensation  BNP initially  was elevated however may be underestimated in obese patients  Now on Lasix p.o.     Leukocytosis: likely secondary to steroid use Improved with transitioning to p.o. steroids   HTN: Stable on beta-blockers and Cardizem  Morbid obesity: BMI 49.0. Would benefit from significant weight loss.  Capital   DVT prophylaxis: coumadin Code Status:  Full  family Communication: None at bedside Status is: Inpatient  Remains inpatient appropriate because:hemodynamically not stable.    Dispo: The patient is from: Home              Anticipated d/c is to: Home              Anticipated d/c date is:1              Patient currently is not medically stable to d/c. Hemodynamically not stable as she still has afib rvr. Needs better HR control prior to d/c to prevent rehospitalization.           LOS: 4 days   Time spent: 35 min with >50% on coc    Nolberto Hanlon, MD [facility] Pager [phone]  If 7PM-7AM, please contact night-coverage [email or web address] Password [facility] 02/23/2020, 6:33 PM
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
## 2020-02-23 NOTE — Progress Notes (Signed)
SATURATION QUALIFICATIONS: (This note is used to comply with regulatory documentation for home oxygen)  Patient Saturations on Room Air at Rest = 93%  Patient Saturations on Room Air while Ambulating = 94%  Patient Saturations on 0 Liters of oxygen while Ambulating = N/A  Please briefly explain why patient needs home oxygen:

## 2020-02-23 NOTE — Progress Notes (Signed)
Patient Name: Laura Gonzalez Date of Encounter: 02/23/2020  Hospital Problem List     Active Problems:   Asthma exacerbation    Patient Profile     79 y.o.femalewith history ofparoxysmal atrial fibrillation, anticoagulated with warfarin, obstructive sleep apnea, compliant with CPAP, hypercholesterolemia, hypertension, and obesity whowas admittted after presenting to the er with complaints of sob, wheezing. She was noted to have ecurrent afib with rvr. EKG showed afib with variable but mostly rvr. BNP was minimally elevated and she was covid negative. CXR showed mild interstital edema. She was placed on diltiazem drip with some improvement but still was having breakthrough rvr Episodes. Her inr was 4.7.She denies chest pain. She is on metoprolol and diltiazem at home. She has been anticoagulated with warfarin  Subjective   Feels better this am. Less sob. Heart rate is much better controlled. Still on n/c oxygen  Inpatient Medications    . amiodarone  200 mg Oral BID  . budesonide (PULMICORT) nebulizer solution  0.25 mg Nebulization BID  . diltiazem  300 mg Oral Daily  . diltiazem  60 mg Oral QHS  . feeding supplement (ENSURE ENLIVE)  237 mL Oral TID  . furosemide  40 mg Oral Daily  . insulin aspart  0-5 Units Subcutaneous QHS  . insulin aspart  0-9 Units Subcutaneous TID WC  . ipratropium-albuterol  3 mL Nebulization BID  . magnesium oxide  400 mg Oral BID  . metoprolol tartrate  50 mg Oral BID  . montelukast  10 mg Oral QHS  . polyethylene glycol  17 g Oral Daily  . potassium chloride  20 mEq Oral BID  . predniSONE  40 mg Oral Q breakfast  . sodium chloride flush  3 mL Intravenous Q12H  . Warfarin - Pharmacist Dosing Inpatient   Does not apply q1600    Vital Signs    Vitals:   02/22/20 2001 02/22/20 2045 02/23/20 0449 02/23/20 0754  BP: 134/72  119/65 122/70  Pulse: (!) 104  81 71  Resp: 20  14 18   Temp: 97.8 F (36.6 C)  97.7 F (36.5 C) 98.4 F (36.9 C)   TempSrc: Oral  Oral Oral  SpO2: 99% 95% 95% 94%  Weight:   128.5 kg   Height:        Intake/Output Summary (Last 24 hours) at 02/23/2020 0759 Last data filed at 02/23/2020 0539 Gross per 24 hour  Intake 1440 ml  Output 2200 ml  Net -760 ml   Filed Weights   02/18/20 1808 02/21/20 1852 02/23/20 0449  Weight: 133.8 kg 128.4 kg 128.5 kg    Physical Exam    GEN: Well nourished, well developed, in no acute distress.  HEENT: normal.  Neck: Supple, no JVD, carotid bruits, or masses. Cardiac:irr, irr. Controlled rate.  Respiratory:  Respirations regular and unlabored, clear to auscultation bilaterally. GI: Soft, nontender, nondistended, BS + x 4. MS: no deformity or atrophy. Skin: warm and dry, erythema on left anticubital fossa.  Neuro:  Strength and sensation are intact. Psych: Normal affect.  Labs    CBC Recent Labs    02/21/20 0535 02/23/20 0352  WBC 15.5* 12.6*  HGB 14.8 13.7  HCT 45.9 42.3  MCV 93.9 91.0  PLT 266 326   Basic Metabolic Panel Recent Labs    02/21/20 0535 02/23/20 0352  NA 141 138  K 3.8 4.1  CL 97* 95*  CO2 31 33*  GLUCOSE 230* 155*  BUN 40* 40*  CREATININE 0.90 0.67  CALCIUM 9.7 9.2   Liver Function Tests No results for input(s): AST, ALT, ALKPHOS, BILITOT, PROT, ALBUMIN in the last 72 hours. No results for input(s): LIPASE, AMYLASE in the last 72 hours. Cardiac Enzymes No results for input(s): CKTOTAL, CKMB, CKMBINDEX, TROPONINI in the last 72 hours. BNP Recent Labs    02/22/20 0459  BNP 153.1*   D-Dimer No results for input(s): DDIMER in the last 72 hours. Hemoglobin A1C Recent Labs    02/22/20 0459  HGBA1C 6.0*   Fasting Lipid Panel No results for input(s): CHOL, HDL, LDLCALC, TRIG, CHOLHDL, LDLDIRECT in the last 72 hours. Thyroid Function Tests No results for input(s): TSH, T4TOTAL, T3FREE, THYROIDAB in the last 72 hours.  Invalid input(s): FREET3  Telemetry    afib with controlled vr  ECG    afib with  rvr  Radiology    DG Chest 2 View  Result Date: 02/18/2020 CLINICAL DATA:  Shortness of breath EXAM: CHEST - 2 VIEW COMPARISON:  July 06, 2019 FINDINGS: The heart size and mediastinal contour are unchanged with cardiomegaly. There is prominence of the central pulmonary vasculature with mildly increased interstitial markings. There is blunting of the left costophrenic angle, likely layering effusion. IMPRESSION: Cardiomegaly and mild interstitial edema. Probable small left layering pleural effusion. Electronically Signed   By: Prudencio Pair M.D.   On: 02/18/2020 19:12    Assessment & Plan    79 yo female with history ofparoxysmal atrial fibrillation, anticoagulated with warfarin, obstructive sleep apnea, compliant with CPAP, hypercholesterolemia, hypertension, and obesity whowas admitted with afib with rvr.   1. afib-rate in much better control. Will continue with amiodarone 200 bid along with metoprolol 50 mg bid, diltiazem cd 300 mg and diltiazem 60 q pm based on hr.  Continue with warfarin with inr goal of 2-3.  2.Diastollic chf-minimal fluid overload as evidenced by cxr and bnp. Careful diuresis. Appears somewhat improved today.  Still on [place] oxygen which she is not on at home. Will attempt to wean off.   3.HTN-will follow pressure on current meds.  4.Arm rash-secondary to iv. WBC lower than yesterday.   Consider discharge today based on oxygen sats and hr response with ambulation. Have made follow up appt in 1 week in our office.    Signed, Javier Docker Aideliz Garmany MD 02/23/2020, 7:59 AM  Pager: [phone]

## 2020-02-24 DIAGNOSIS — D72829 Elevated white blood cell count, unspecified: Secondary | ICD-10-CM

## 2020-02-24 LAB — GLUCOSE, CAPILLARY
Glucose-Capillary: 121 mg/dL — ABNORMAL HIGH (ref 70–99)
Glucose-Capillary: 147 mg/dL — ABNORMAL HIGH (ref 70–99)
Glucose-Capillary: 169 mg/dL — ABNORMAL HIGH (ref 70–99)
Glucose-Capillary: 246 mg/dL — ABNORMAL HIGH (ref 70–99)

## 2020-02-24 LAB — CBC
HCT: 40.9 % (ref 36.0–46.0)
Hemoglobin: 13.6 g/dL (ref 12.0–15.0)
MCH: 29.4 pg (ref 26.0–34.0)
MCHC: 33.3 g/dL (ref 30.0–36.0)
MCV: 88.3 fL (ref 80.0–100.0)
Platelets: 253 10*3/uL (ref 150–400)
RBC: 4.63 MIL/uL (ref 3.87–5.11)
RDW: 14.5 % (ref 11.5–15.5)
WBC: 15 10*3/uL — ABNORMAL HIGH (ref 4.0–10.5)
nRBC: 0 % (ref 0.0–0.2)

## 2020-02-24 LAB — PROTIME-INR
INR: 2.2 — ABNORMAL HIGH (ref 0.8–1.2)
Prothrombin Time: 23.9 seconds — ABNORMAL HIGH (ref 11.4–15.2)

## 2020-02-24 MED ORDER — POTASSIUM CHLORIDE CRYS ER 20 MEQ PO TBCR
20.0000 meq | EXTENDED_RELEASE_TABLET | Freq: Every day | ORAL | Status: DC
  Administered 2020-02-25 – 2020-02-26 (×2): 20 meq via ORAL
  Filled 2020-02-24 (×2): qty 1

## 2020-02-24 MED ORDER — WARFARIN SODIUM 3 MG PO TABS
3.0000 mg | ORAL_TABLET | Freq: Once | ORAL | Status: AC
  Administered 2020-02-24: 3 mg via ORAL
  Filled 2020-02-24: qty 1

## 2020-02-24 MED ORDER — PREDNISONE 20 MG PO TABS
20.0000 mg | ORAL_TABLET | Freq: Every day | ORAL | Status: DC
  Administered 2020-02-25: 20 mg via ORAL
  Filled 2020-02-24: qty 1

## 2020-02-24 MED ORDER — METOPROLOL TARTRATE 25 MG PO TABS
25.0000 mg | ORAL_TABLET | Freq: Two times a day (BID) | ORAL | Status: DC
  Administered 2020-02-24 – 2020-02-25 (×2): 25 mg via ORAL
  Filled 2020-02-24 (×2): qty 1

## 2020-02-24 MED ORDER — FUROSEMIDE 20 MG PO TABS
20.0000 mg | ORAL_TABLET | Freq: Every day | ORAL | Status: DC
  Administered 2020-02-25 – 2020-02-26 (×2): 20 mg via ORAL
  Filled 2020-02-24 (×2): qty 1
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
## 2020-02-24 NOTE — Progress Notes (Signed)
PROGRESS NOTE    Laura Gonzalez  XNA:[id number] DOB: Oct 31, 1940 DOA: 02/18/2020 PCP: Baxter Hire, MD    Brief Narrative:   Laura Gonzalez is a 79 y.o. female with medical history significant of chronic atrial fibrillation on Coumadin, chronic diastolic CHF, asthma on intermittent oxygen at home PRN, hypertension, hyperlipidemia, comes into the hospital with chief complaint of shortness of breath. Now with afib rvr too.     Consultants:   Cardio  Procedures:   Antimicrobials:       Subjective: Feeling better.  However when she stood up she felt dizzy. Telemetry with pauses. With ambulation HR  95-105   Objective: Vitals:   02/24/20 0837 02/24/20 0855 02/24/20 1157 02/24/20 1639  BP: (!) 123/59  (!) 113/52 (!) 122/55  Pulse: 78  73 (!) 57  Resp: 18  18 18   Temp: 97.9 F (36.6 C)  98.4 F (36.9 C) 97.6 F (36.4 C)  TempSrc: Oral  Oral Oral  SpO2: 96% 95% 95%   Weight:      Height:        Intake/Output Summary (Last 24 hours) at 02/24/2020 1706 Last data filed at 02/23/2020 1840 Gross per 24 hour  Intake 240 ml  Output 200 ml  Net 40 ml   Filed Weights   02/21/20 1852 02/23/20 0449 02/24/20 0526  Weight: 128.4 kg 128.5 kg 128.9 kg    Examination: Sitting in bed, comfortable, NAD Clear to auscultation no wheeze rales rhonchi's Irregular S1-S2 no murmurs rubs Soft benign positive bowel sounds Chronic skin changes positive edema Neuro exam alert oriented x3 grossly intact Mood and affect appropriate in current setting   Data Reviewed: I have personally reviewed following labs and imaging studies  CBC: Recent Labs  Lab 02/18/20 1805 02/18/20 1805 02/19/20 0619 02/20/20 0202 02/21/20 0535 02/23/20 0352 02/24/20 0437  WBC 12.9*   < > 13.4* 17.6* 15.5* 12.6* 15.0*  NEUTROABS 10.7*  --   --   --   --   --   --   HGB 13.1   < > 13.4 14.0 14.8 13.7 13.6  HCT 40.9   < > 42.0 43.6 45.9 42.3 40.9  MCV 94.0   < > 93.5 93.0 93.9 91.0 88.3  PLT 231   <  > 190 231 266 239 253   < > = values in this interval not displayed.   Basic Metabolic Panel: Recent Labs  Lab 02/18/20 1805 02/19/20 0619 02/20/20 0202 02/21/20 0535 02/23/20 0352  NA 139 139 139 141 138  K 4.9 3.6 3.8 3.8 4.1  CL 102 98 96* 97* 95*  CO2 27 29 30 31  33*  GLUCOSE 124* 196* 151* 230* 155*  BUN 14 15 31* 40* 40*  CREATININE 0.79 0.84 0.93 0.90 0.67  CALCIUM 8.9 8.4* 8.8* 9.7 9.2   GFR: Estimated Creatinine Clearance: 78.5 mL/min (by C-G formula based on SCr of 0.67 mg/dL). Liver Function Tests: Recent Labs  Lab 02/19/20 0619  AST 21  ALT 21  ALKPHOS 68  BILITOT 1.0  PROT 7.4  ALBUMIN 3.5   No results for input(s): LIPASE, AMYLASE in the last 168 hours. No results for input(s): AMMONIA in the last 168 hours. Coagulation Profile: Recent Labs  Lab 02/20/20 0202 02/21/20 0535 02/22/20 0459 02/23/20 0352 02/24/20 0437  INR 5.2* 2.1* 2.1* 2.3* 2.2*   Cardiac Enzymes: No results for input(s): CKTOTAL, CKMB, CKMBINDEX, TROPONINI in the last 168 hours. BNP (last 3 results) No results for input(s):  PROBNP in the last 8760 hours. HbA1C: Recent Labs    02/22/20 0459  HGBA1C 6.0*   CBG: Recent Labs  Lab 02/23/20 1631 02/23/20 2036 02/24/20 0839 02/24/20 1153 02/24/20 1635  GLUCAP 183* 222* 121* 147* 169*   Lipid Profile: No results for input(s): CHOL, HDL, LDLCALC, TRIG, CHOLHDL, LDLDIRECT in the last 72 hours. Thyroid Function Tests: No results for input(s): TSH, T4TOTAL, FREET4, T3FREE, THYROIDAB in the last 72 hours. Anemia Panel: No results for input(s): VITAMINB12, FOLATE, FERRITIN, TIBC, IRON, RETICCTPCT in the last 72 hours. Sepsis Labs: No results for input(s): PROCALCITON, LATICACIDVEN in the last 168 hours.  Recent Results (from the past 240 hour(s))  SARS Coronavirus 2 by RT PCR (hospital order, performed in [facility] hospital lab) Nasopharyngeal Nasopharyngeal Swab     Status: None   Collection Time: 02/18/20  8:39 PM    Specimen: Nasopharyngeal Swab  Result Value Ref Range Status   SARS Coronavirus 2 NEGATIVE NEGATIVE Final    Comment: (NOTE) SARS-CoV-2 target nucleic acids are NOT DETECTED.  The SARS-CoV-2 RNA is generally detectable in upper and lower respiratory specimens during the acute phase of infection. The lowest concentration of SARS-CoV-2 viral copies this assay can detect is 250 copies / mL. A negative result does not preclude SARS-CoV-2 infection and should not be used as the sole basis for treatment or other patient management decisions.  A negative result may occur with improper specimen collection / handling, submission of specimen other than nasopharyngeal swab, presence of viral mutation(s) within the areas targeted by this assay, and inadequate number of viral copies (<250 copies / mL). A negative result must be combined with clinical observations, patient history, and epidemiological information.  Fact Sheet for Patients:   StrictlyIdeas.no  Fact Sheet for Healthcare Providers: BankingDealers.co.za  This test is not yet approved or  cleared by the [place] FDA and has been authorized for detection and/or diagnosis of SARS-CoV-2 by FDA under an Emergency Use Authorization (EUA).  This EUA will remain in effect (meaning this test can be used) for the duration of the COVID-19 declaration under Section 564(b)(1) of the Act, 21 U.S.C. section 360bbb-3(b)(1), unless the authorization is terminated or revoked sooner.  Performed at [facility], [address] [place], [place] [address]          Radiology Studies: No results found.      Scheduled Meds: . amiodarone  400 mg Oral BID  . budesonide (PULMICORT) nebulizer solution  0.25 mg Nebulization BID  . diltiazem  300 mg Oral Daily  . diltiazem  60 mg Oral QHS  . feeding supplement (ENSURE ENLIVE)  237 mL Oral TID  . furosemide  40 mg Oral Daily  .  insulin aspart  0-5 Units Subcutaneous QHS  . insulin aspart  0-9 Units Subcutaneous TID WC  . ipratropium-albuterol  3 mL Nebulization BID  . magnesium oxide  400 mg Oral BID  . metoprolol tartrate  25 mg Oral BID  . montelukast  10 mg Oral QHS  . polyethylene glycol  17 g Oral Daily  . potassium chloride  20 mEq Oral BID  . predniSONE  40 mg Oral Q breakfast  . sodium chloride flush  3 mL Intravenous Q12H  . Warfarin - Pharmacist Dosing Inpatient   Does not apply q1600   Continuous Infusions:   Assessment & Plan:   Active Problems:   Asthma exacerbation   Asthma exacerbation: Improved Started on p.o. steroid taper  Decrease 40 to  20 mg to start in a.m. Continue inhalers keep oxygen saturation above 90% Currently with ambulation she is 94% on room air Continue incentive spirometer    Acute on chronic hypoxic respiratory failure: Likely secondary to asthma exacerbation with some acute on chronic diastolic heart failure. Weaned off of BiPAP and currently on nasal cannula likely secondary to asthma exacerbation.  9/4: Improved.  Now on room air satting 94% even with ambulation.       A. Fib  w/ RVR. Likely RVR from asthma exacerbation.  On cardizem and beta blk.  9/2-heart rate better controlled on amiodarone drip, started on po amidarone 200 mg  twice daily 9/3 rate was controlled, however with ambulation patient's heart rate increased to 120s.  Discussed with cardiology, will increase amiodarone to 400 mg twice daily On Coumadin and INR is therapeutic at 2.2 9/4: Rate better controlled.  With ambulation is 95-1 05.  On telemetry with 1.pauses, cards aware We will continue with amiodarone 400 mg twice daily Continue with Cardizem and beta-blockers Metoprolol  25mg  bid    Supratherapeutic INR: INR 5.2.Marland KitchenMarland Kitchen>down to 2's. Was given  s/p vitamin K x1.  INR therapeutic at 2.2.   Dosing per pharmacy    Acute on chronic diastolic CHF: Was volume overloaded due to A.  fib RVR.  Was given IV Lasix with improvement of volume status.   Now more euvolemic with better heart rate control.   Need to continue controlling heart rate to prevent her from going into acute decompensation  BNP initially was elevated. 9/4-euvolemic on exam. On Lasix p.o. Continue monitoring I's and O's    Leukocytosis: likely secondary to steroid use 9/4 Was down now mildly up. Afebrile, no indication for antibiotics at this time Continue to monitor   HTN: Controlled on Cardizem and metoprolol  Morbid obesity: BMI 49.0. Would benefit from significant weight loss.  Capital   DVT prophylaxis: coumadin Code Status:  Full  family Communication: None at bedside Status is: Inpatient  Remains inpatient appropriate because:hemodynamically not stable.    Dispo: The patient is from: Home              Anticipated d/c is to: Home              Anticipated d/c date is:1              Patient currently is not medically stable to d/c. Hemodynamically not stable as she still has afib rvr. Having pauses on telemetry. Adjusted meds.           LOS: 5 days   Time spent: 35 min with >50% on coc    Nolberto Hanlon, MD [facility] Pager [phone]  If 7PM-7AM, please contact night-coverage [email or web address] Password [email or web address] 02/24/2020, 5:06 PM

## 2020-02-24 NOTE — Progress Notes (Signed)
CCMD called to report 11 beat run of Vtach, pt asymptomatic. Cardiologist on unit, made aware. Will continue to monitor.

## 2020-02-24 NOTE — Evaluation (Signed)
Occupational Therapy Evaluation Patient Details Name: Laura Gonzalez MRN: [id number] DOB: 11-Sep-1940 Today's Date: 02/24/2020    History of Present Illness Pt is 79 yo female that presented to ED for SOB, initially placed on bipap.  with PMH of asthma, afib, skin cancer, CHF, GERD, intermittent use of O2 at home. T/f to PCU for infusion and OT reordered.   Clinical Impression   Pt seen for Re-eval in setting of moving to PCU for infusion. Pt in good spirits and up to chair when OT presents. Pt with significantly better tolerance overall versus initial evaluation. Completes transfers with SUPV to MOD I with RW from recliner. Demos good control. Pt still requiring significant assist with LB ADLs (MIN/MOD A) but pt endorses this is difficult at baseline d/t abd circumference and pain. Pt able to modify task and use slip on shoes in her ALF setting per her report. Overall, pt appears to be closer to functional baseline, but would benefit from continued therapy follow up to ensure pt with fxl activity tolerance to safely/efficiently complete self care ADLs in the natural environment. Will continue to follow in acute setting.     Follow Up Recommendations  Home health OT    Equipment Recommendations  None recommended by OT    Recommendations for Other Services       Precautions / Restrictions Precautions Precautions: Fall Precaution Comments: HR Restrictions Weight Bearing Restrictions: No      Mobility Bed Mobility               General bed mobility comments: pt up to chair pre/post session  Transfers Overall transfer level: Needs assistance Equipment used: Rolling walker (2 wheeled) Transfers: Sit to/from Stand Sit to Stand: Modified independent (Device/Increase time);Supervision         General transfer comment: CGA/SBA initially, but pt ultimately compeltes at SUPV to MOD I level    Balance Overall balance assessment: Needs assistance Sitting-balance support: Feet  supported Sitting balance-Leahy Scale: Good     Standing balance support: During functional activity Standing balance-Leahy Scale: Good Standing balance comment: 1 UE support on RW, alternating during functional task, B UE support during fxl mobility.                           ADL either performed or assessed with clinical judgement   ADL Overall ADL's : Needs assistance/impaired Eating/Feeding: Independent;Sitting   Grooming: Wash/dry hands;Wash/dry face;Oral care;Sitting;Set up   Upper Body Bathing: Set up;Sitting   Lower Body Bathing: Minimal assistance;Moderate assistance;Sit to/from stand Lower Body Bathing Details (indicate cue type and reason): to don socks Upper Body Dressing : Set up;Sitting   Lower Body Dressing: Minimal assistance;Moderate assistance;Sit to/from stand Lower Body Dressing Details (indicate cue type and reason): Able to thread wide leg underwear, shorts, but limited ability to flex at hips for small items like socks, modifies this task at home with wide leg items. Toilet Transfer: Supervision/safety;Grab bars;RW   Writer and Hygiene: Supervision/safety;Sit to/from stand       Functional mobility during ADLs: Supervision/safety;Rolling walker       Vision Baseline Vision/History: No visual deficits       Perception     Praxis      Pertinent Vitals/Pain Pain Assessment: No/denies pain     Hand Dominance Right   Extremity/Trunk Assessment Upper Extremity Assessment Upper Extremity Assessment: Overall WFL for tasks assessed;Generalized weakness (ROM WFL, MMT grossly 4-/5)   Lower Extremity Assessment  Lower Extremity Assessment: Generalized weakness (reports h/o b/l painful knees)   Cervical / Trunk Assessment Cervical / Trunk Assessment: Normal   Communication Communication Communication: No difficulties   Cognition Arousal/Alertness: Awake/alert Behavior During Therapy: WFL for tasks  assessed/performed Overall Cognitive Status: Within Functional Limits for tasks assessed                                     General Comments       Exercises Other Exercises Other Exercises: OT facilitates ed re: modification for self care (LB ADLs) pt with good understanding.   Shoulder Instructions      Home Living Family/patient expects to be discharged to:: Assisted living Living Arrangements: Alone Available Help at Discharge: Friend(s);Neighbor;Available PRN/intermittently Type of Home: Apartment Home Access: Elevator     Home Layout: One level     Bathroom Shower/Tub: Walk-in shower;Door   [employer] Toilet: Standard     Home Equipment: Clinical cytogeneticist - 4 wheels;Grab bars - tub/shower;Electric scooter;Other (comment)   Additional Comments: 3 wheeled walker      Prior Functioning/Environment Level of Independence: Independent with assistive device(s)        Comments: Pt reports she is mod I with self care tasks. She goes to a dining room for 1-2 meal each day. No home O2. Shes does laundry in her room and facility will drop off groceries.        OT Problem List: Decreased strength;Decreased activity tolerance;Decreased safety awareness;Impaired balance (sitting and/or standing);Cardiopulmonary status limiting activity      OT Treatment/Interventions: Self-care/ADL training;Therapeutic exercise;Therapeutic activities;Energy conservation;DME and/or AE instruction;Balance training;Modalities    OT Goals(Current goals can be found in the care plan section) Acute Rehab OT Goals Patient Stated Goal: to go home OT Goal Formulation: With patient Time For Goal Achievement: 03/09/20 Potential to Achieve Goals: Good  OT Frequency: Min 1X/week   Barriers to D/C:            Co-evaluation              AM-PAC OT "6 Clicks" Daily Activity     Outcome Measure Help from another person eating meals?: None Help from another person taking care  of personal grooming?: A Little Help from another person toileting, which includes using toliet, bedpan, or urinal?: A Little Help from another person bathing (including washing, rinsing, drying)?: A Lot Help from another person to put on and taking off regular upper body clothing?: A Little Help from another person to put on and taking off regular lower body clothing?: A Lot 6 Click Score: 17   End of Session Equipment Utilized During Treatment: Rolling walker  Activity Tolerance: Patient tolerated treatment well Patient left: in chair;with call bell/phone within reach  OT Visit Diagnosis: Unsteadiness on feet (R26.81);Muscle weakness (generalized) (M62.81)                Time: 3734-2876 OT Time Calculation (min): 14 min Charges:  OT General Charges $OT Visit: 1 Visit OT Evaluation $OT Re-eval: 1 Re-eval  Gerrianne Scale, [place], OTR/L ascom [phone] 02/24/20, 4:26 PM/

## 2020-02-24 NOTE — Progress Notes (Signed)
[facility]  SUBJECTIVE: Patient ambulating with walker, reports feeling better   Vitals:   02/24/20 0526 02/24/20 0837 02/24/20 0855 02/24/20 1157  BP: (!) 107/58 (!) 123/59  (!) 113/52  Pulse: 62 78  73  Resp:  18  18  Temp: 97.9 F (36.6 C) 97.9 F (36.6 C)  98.4 F (36.9 C)  TempSrc: Oral Oral  Oral  SpO2: 93% 96% 95% 95%  Weight: 128.9 kg     Height:         Intake/Output Summary (Last 24 hours) at 02/24/2020 1206 Last data filed at 02/23/2020 1840 Gross per 24 hour  Intake 480 ml  Output 700 ml  Net -220 ml      PHYSICAL EXAM  General: Well developed, well nourished, in no acute distress HEENT:  Normocephalic and atramatic Neck:  No JVD.  Lungs: Clear bilaterally to auscultation and percussion. Heart: HRRR . Normal S1 and S2 without gallops or murmurs.  Abdomen: Bowel sounds are positive, abdomen soft and non-tender  Msk:  Back normal, normal gait. Normal strength and tone for age. Extremities: No clubbing, cyanosis or edema.   Neuro: Alert and oriented X 3. Psych:  Good affect, responds appropriately   LABS: Basic Metabolic Panel: Recent Labs    02/23/20 0352  NA 138  K 4.1  CL 95*  CO2 33*  GLUCOSE 155*  BUN 40*  CREATININE 0.67  CALCIUM 9.2   Liver Function Tests: No results for input(s): AST, ALT, ALKPHOS, BILITOT, PROT, ALBUMIN in the last 72 hours. No results for input(s): LIPASE, AMYLASE in the last 72 hours. CBC: Recent Labs    02/23/20 0352 02/24/20 0437  WBC 12.6* 15.0*  HGB 13.7 13.6  HCT 42.3 40.9  MCV 91.0 88.3  PLT 239 253   Cardiac Enzymes: No results for input(s): CKTOTAL, CKMB, CKMBINDEX, TROPONINI in the last 72 hours. BNP: Invalid input(s): POCBNP D-Dimer: No results for input(s): DDIMER in the last 72 hours. Hemoglobin A1C: Recent Labs    02/22/20 0459  HGBA1C 6.0*   Fasting Lipid Panel: No results for input(s): CHOL, HDL, LDLCALC, TRIG, CHOLHDL, LDLDIRECT in the last 72 hours. Thyroid Function Tests: No  results for input(s): TSH, T4TOTAL, T3FREE, THYROIDAB in the last 72 hours.  Invalid input(s): FREET3 Anemia Panel: No results for input(s): VITAMINB12, FOLATE, FERRITIN, TIBC, IRON, RETICCTPCT in the last 72 hours.  No results found.   Echo LVEF 50 to 55% by 2D echocardiogram 01/02/2019  TELEMETRY: Atrial fibrillation at a rate of 77 bpm:  ASSESSMENT AND PLAN:  Active Problems:   Asthma exacerbation    1.  Atrial fibrillation with RVR, initially treated with amiodarone bolus and IV, transition to amiodarone 200 mg p.o. twice daily, also on metoprolol tartrate 50 mg twice daily and diltiazem CD 300 mg daily, currently in atrial fibrillation at a rate of 77 bpm, with one 1.2-second pause 2.  Asthma exacerbation, improved  Recommendations  1.  Agree with current therapy 2.  Continue amiodarone 200 mg p.o. twice daily for rate control 3.  Continue Cardizem CD for now 4.  Decrease metoprolol to tartrate to 25 mg twice daily 5.  No indication for temporary or permanent pacemaker at this time   Isaias Cowman, MD, PhD, [facility] 02/24/2020 12:06 PM

## 2020-02-25 DIAGNOSIS — I5033 Acute on chronic diastolic (congestive) heart failure: Secondary | ICD-10-CM

## 2020-02-25 DIAGNOSIS — I5032 Chronic diastolic (congestive) heart failure: Secondary | ICD-10-CM | POA: Insufficient documentation

## 2020-02-25 DIAGNOSIS — R791 Abnormal coagulation profile: Secondary | ICD-10-CM

## 2020-02-25 LAB — GLUCOSE, CAPILLARY
Glucose-Capillary: 106 mg/dL — ABNORMAL HIGH (ref 70–99)
Glucose-Capillary: 111 mg/dL — ABNORMAL HIGH (ref 70–99)
Glucose-Capillary: 298 mg/dL — ABNORMAL HIGH (ref 70–99)
Glucose-Capillary: 299 mg/dL — ABNORMAL HIGH (ref 70–99)
Glucose-Capillary: 248 mg/dL — ABNORMAL HIGH (ref 70–99)

## 2020-02-25 LAB — PROTIME-INR
INR: 2.1 — ABNORMAL HIGH (ref 0.8–1.2)
Prothrombin Time: 22.8 seconds — ABNORMAL HIGH (ref 11.4–15.2)

## 2020-02-25 MED ORDER — PREDNISONE 10 MG PO TABS
10.0000 mg | ORAL_TABLET | Freq: Every day | ORAL | Status: DC
  Administered 2020-02-26: 10 mg via ORAL
  Filled 2020-02-25: qty 1

## 2020-02-25 MED ORDER — AMIODARONE HCL 200 MG PO TABS
200.0000 mg | ORAL_TABLET | Freq: Two times a day (BID) | ORAL | Status: DC
  Administered 2020-02-25 – 2020-02-26 (×4): 200 mg via ORAL
  Filled 2020-02-25 (×4): qty 1

## 2020-02-25 MED ORDER — METOPROLOL TARTRATE 50 MG PO TABS
50.0000 mg | ORAL_TABLET | Freq: Two times a day (BID) | ORAL | Status: DC
  Administered 2020-02-25 – 2020-02-26 (×2): 50 mg via ORAL
  Filled 2020-02-25 (×2): qty 1

## 2020-02-25 MED ORDER — WARFARIN SODIUM 3 MG PO TABS
3.0000 mg | ORAL_TABLET | Freq: Once | ORAL | Status: AC
  Administered 2020-02-25: 3 mg via ORAL
  Filled 2020-02-25: qty 1

## 2020-02-25 NOTE — Progress Notes (Addendum)
PROGRESS NOTE    Laura Gonzalez  Laura Gonzalez DOB: Laura Gonzalez, Laura Gonzalez DOA: 02/18/2020 PCP: Baxter Hire, MD    Brief Narrative:   Laura Gonzalez is a 79 y.o. female with medical history significant of chronic atrial fibrillation on Coumadin, chronic diastolic CHF, asthma on intermittent oxygen at home PRN, hypertension, hyperlipidemia, comes into the hospital with chief complaint of shortness of breath. Now with afib rvr too.     Consultants:   Cardiology  Procedures:   Antimicrobials:       Subjective: Feels okay while laying in bed.  However when she walked her heart rate was 1 25-1 35 and she was quite symptomatic.  She stated with 2-3 steps her heart rate went up she became very short of breath.   Objective: Vitals:   02/25/20 1008 02/25/20 1016 02/25/20 1236 02/25/20 1644  BP: (!) 105/34 127/76 103/62 109/63  Pulse: 90  78 82  Resp:    18  Temp: 98 F (36.7 C)  97.6 F (36.4 C) 97.7 F (36.5 C)  TempSrc: Oral  Oral Oral  SpO2: 96%  93% 94%  Weight:      Height:        Intake/Output Summary (Last 24 hours) at 02/25/2020 1750 Last data filed at 02/25/2020 1500 Gross per 24 hour  Intake 240 ml  Output 401 ml  Net -161 ml   Filed Weights   02/23/20 0449 02/24/20 0526 02/25/20 0435  Weight: 128.5 kg 128.9 kg 128.3 kg    Examination: Currently comfortable sitting in bed, NAD, appears tired after ambulation Clear to auscultation no wheeze rales rhonchi's Irregular S1-S2 no murmurs Soft benign positive bowel sounds nontender Chronic skin changes positive lower extremity edema Alert oriented x3 grossly intact Mood and affect appropriate   Data Reviewed: I have personally reviewed following labs and imaging studies  CBC: Recent Labs  Lab 02/18/20 1805 02/18/20 1805 02/19/20 0619 02/20/20 0202 02/21/20 0535 02/23/20 0352 02/24/20 0437  WBC 12.9*   < > 13.4* 17.6* 15.5* 12.6* 15.0*  NEUTROABS 10.7*  --   --   --   --   --   --   HGB 13.1   < > 13.4  14.0 14.8 13.7 13.6  HCT 40.9   < > 42.0 43.6 45.9 42.3 40.9  MCV 94.0   < > 93.5 93.0 93.9 91.0 88.3  PLT 231   < > 190 231 266 239 253   < > = values in this interval not displayed.   Basic Metabolic Panel: Recent Labs  Lab 02/18/20 1805 02/19/20 0619 02/20/20 0202 02/21/20 0535 02/23/20 0352  NA 139 139 139 141 138  K 4.9 3.6 3.8 3.8 4.1  CL 102 98 96* 97* 95*  CO2 27 29 30 31  33*  GLUCOSE 124* 196* 151* 230* 155*  BUN 14 15 31* 40* 40*  CREATININE 0.79 0.84 0.93 0.90 0.67  CALCIUM 8.9 8.4* 8.8* 9.7 9.2   GFR: Estimated Creatinine Clearance: 78.2 mL/min (by C-G formula based on SCr of 0.67 mg/dL). Liver Function Tests: Recent Labs  Lab 02/19/20 0619  AST 21  ALT 21  ALKPHOS 68  BILITOT 1.0  PROT 7.4  ALBUMIN 3.5   No results for input(s): LIPASE, AMYLASE in the last 168 hours. No results for input(s): AMMONIA in the last 168 hours. Coagulation Profile: Recent Labs  Lab 02/21/20 0535 02/22/20 0459 02/23/20 0352 02/24/20 0437 02/25/20 0337  INR 2.1* 2.1* 2.3* 2.2* 2.1*   Cardiac Enzymes: No  results for input(s): CKTOTAL, CKMB, CKMBINDEX, TROPONINI in the last 168 hours. BNP (last 3 results) No results for input(s): PROBNP in the last 8760 hours. HbA1C: No results for input(s): HGBA1C in the last 72 hours. CBG: Recent Labs  Lab 02/24/20 2216 02/25/20 0749 02/25/20 1144 02/25/20 1641 02/25/20 1721  GLUCAP 246* 106* 111* 298* 299*   Lipid Profile: No results for input(s): CHOL, HDL, LDLCALC, TRIG, CHOLHDL, LDLDIRECT in the last 72 hours. Thyroid Function Tests: No results for input(s): TSH, T4TOTAL, FREET4, T3FREE, THYROIDAB in the last 72 hours. Anemia Panel: No results for input(s): VITAMINB12, FOLATE, FERRITIN, TIBC, IRON, RETICCTPCT in the last 72 hours. Sepsis Labs: No results for input(s): PROCALCITON, LATICACIDVEN in the last 168 hours.  Recent Results (from the past 240 hour(s))  SARS Coronavirus 2 by RT PCR (hospital order, performed in  [facility] hospital lab) Nasopharyngeal Nasopharyngeal Swab     Status: None   Collection Time: 02/18/20  8:39 PM   Specimen: Nasopharyngeal Swab  Result Value Ref Range Status   SARS Coronavirus 2 NEGATIVE NEGATIVE Final    Comment: (NOTE) SARS-CoV-2 target nucleic acids are NOT DETECTED.  The SARS-CoV-2 RNA is generally detectable in upper and lower respiratory specimens during the acute phase of infection. The lowest concentration of SARS-CoV-2 viral copies this assay can detect is 250 copies / mL. A negative result does not preclude SARS-CoV-2 infection and should not be used as the sole basis for treatment or other patient management decisions.  A negative result may occur with improper specimen collection / handling, submission of specimen other than nasopharyngeal swab, presence of viral mutation(s) within the areas targeted by this assay, and inadequate number of viral copies (<250 copies / mL). A negative result must be combined with clinical observations, patient history, and epidemiological information.  Fact Sheet for Patients:   StrictlyIdeas.no  Fact Sheet for Healthcare Providers: BankingDealers.co.za  This test is not yet approved or  cleared by the [place] FDA and has been authorized for detection and/or diagnosis of SARS-CoV-2 by FDA under an Emergency Use Authorization (EUA).  This EUA will remain in effect (meaning this test can be used) for the duration of the COVID-19 declaration under Section 564(b)(1) of the Act, 21 U.S.C. section 360bbb-3(b)(1), unless the authorization is terminated or revoked sooner.  Performed at [facility], [address], [place], [place] [address]          Radiology Studies: No results found.      Scheduled Meds: . amiodarone  200 mg Oral BID  . budesonide (PULMICORT) nebulizer solution  0.25 mg Nebulization BID  . diltiazem  300 mg Oral Daily  .  diltiazem  60 mg Oral QHS  . feeding supplement (ENSURE ENLIVE)  237 mL Oral TID  . furosemide  20 mg Oral Daily  . insulin aspart  0-5 Units Subcutaneous QHS  . insulin aspart  0-9 Units Subcutaneous TID WC  . ipratropium-albuterol  3 mL Nebulization BID  . magnesium oxide  400 mg Oral BID  . metoprolol tartrate  50 mg Oral BID  . montelukast  10 mg Oral QHS  . polyethylene glycol  17 g Oral Daily  . potassium chloride  20 mEq Oral Daily  . predniSONE  20 mg Oral Q breakfast  . sodium chloride flush  3 mL Intravenous Q12H  . Warfarin - Pharmacist Dosing Inpatient   Does not apply q1600   Continuous Infusions:   Assessment & Plan:   Active Problems:  HTN (hypertension)   Asthma exacerbation   Acute on chronic diastolic CHF (congestive heart failure) (HCC)   Obesity, Class III, BMI 40-49.9 (morbid obesity) (HCC)   Supratherapeutic INR   Asthma exacerbation: Improved Started on p.o. steroid taper  Keep O2 sat above 90%  Currently ambulating on room air 94% saturation  Decrease prednisone to 10mg  po daily x2 days starting tomorrow. Continue incentive spirometer     Acute on chronic hypoxic respiratory failure: Likely secondary to asthma exacerbation with some acute on chronic diastolic heart failure. Weaned off of BiPAP and currently on nasal cannula likely secondary to asthma exacerbation.  Now she is on room air satting 94%  Continue incentive spirometer     A. Fib  w/ RVR. Likely RVR from asthma exacerbation.  Continue on Cardizem and beta-blocker  9/2-heart rate better controlled on amiodarone drip, started on po amidarone 200 mg  twice daily 9/3 rate was controlled, however with ambulation patient's heart rate increased to 120s.  Discussed with cardiology, will increase amiodarone to 400 mg twice daily On Coumadin and INR is therapeutic at 2.2 9/4: Rate better controlled.  With ambulation is 95-1 05.  On telemetry with 1.pauses, cards aware.  9/5- HR controlled  while lying in bed, but with ambulation increased to 125-135 and was symptomatic. Cardiology decreased amidarone to 200mg  bid Since HR increases with ambulation , will increase beta blk We will increase metoprolol to 50 mg twice daily with parameters   Hypeglycemia- likely from steroids.although she did have outside food from steak house.  Will ck H1c riss fs    Supratherapeutic INR: INR 5.2.Marland KitchenMarland Kitchen>down to 2's. Was given  s/p vitamin K x1.  INR 2.1 Dosing per pharmacy   Acute on chronic diastolic CHF: Was volume overloaded due to A. fib RVR initially .  Was given IV Lasix with improvement of volume status.  Then was euvolemic without exacerbation Monitor volume status until rate better controlled to prevent decompensation Continue monitoring I's and O's Continue Lasix Daily weight   Leukocytosis: likely secondary to steroid use. 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 Would benefit from significant weight loss.  Capital   DVT prophylaxis: coumadin Code Status:  Full  family Communication: None at bedside Status is: Inpatient  Remains inpatient appropriate because:hemodynamically not stable.    Dispo: The patient is from: Home              Anticipated d/c is to: Home              Anticipated d/c date is:1-2 days              Patient currently is not medically stable to d/c. Hemodynamically not stable as she Needs rate control .          LOS: 6 days   Time spent: 35 min with >50% on coc    Nolberto Hanlon, MD [facility] Pager [phone]  If 7PM-7AM, please contact night-coverage [email or web address] Password [email or web address] 02/25/2020, 5:50 PM

## 2020-02-25 NOTE — Progress Notes (Signed)
Pt used transferred from [facility] to chair. Pt resting in chair. Call bell and personal items within reach.

## 2020-02-25 NOTE — Progress Notes (Signed)
cpap declined by pt 

## 2020-02-25 NOTE — Progress Notes (Signed)
Patient ambulated in hall with staff. On RA oxygen 91-95%.  Heart rate in 120-140s and patient stating SOB with activity. Dr. Kurtis Bushman made aware.

## 2020-02-25 NOTE — Therapy (Signed)
Pt refused CPAP
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
## 2020-02-25 NOTE — Progress Notes (Signed)
[facility] Cardiology  SUBJECTIVE: Patient laying in bed, reports feeling better, denies chest pain or palpitations   Vitals:   02/24/20 1639 02/24/20 2001 02/24/20 2339 02/25/20 0435  BP: (!) 122/55 (!) 117/57  104/63  Pulse: 77 84  65  Resp: 18 18  18   Temp: 97.6 F (36.4 C) 98.1 F (36.7 C)  97.6 F (36.4 C)  TempSrc: Oral   Oral  SpO2:  95% 95% 93%  Weight:    128.3 kg  Height:         Intake/Output Summary (Last 24 hours) at 02/25/2020 6720 Last data filed at 02/25/2020 0400 Gross per 24 hour  Intake --  Output 1 ml  Net -1 ml      PHYSICAL EXAM  General: Well developed, well nourished, in no acute distress HEENT:  Normocephalic and atramatic Neck:  No JVD.  Lungs: Clear bilaterally to auscultation and percussion. Heart: HRRR . Normal S1 and S2 without gallops or murmurs.  Abdomen: Bowel sounds are positive, abdomen soft and non-tender  Msk:  Back normal, normal gait. Normal strength and tone for age. Extremities: No clubbing, cyanosis or edema.   Neuro: Alert and oriented X 3. Psych:  Good affect, responds appropriately   LABS: Basic Metabolic Panel: Recent Labs    02/23/20 0352  NA 138  K 4.1  CL 95*  CO2 33*  GLUCOSE 155*  BUN 40*  CREATININE 0.67  CALCIUM 9.2   Liver Function Tests: No results for input(s): AST, ALT, ALKPHOS, BILITOT, PROT, ALBUMIN in the last 72 hours. No results for input(s): LIPASE, AMYLASE in the last 72 hours. CBC: Recent Labs    02/23/20 0352 02/24/20 0437  WBC 12.6* 15.0*  HGB 13.7 13.6  HCT 42.3 40.9  MCV 91.0 88.3  PLT 239 253   Cardiac Enzymes: No results for input(s): CKTOTAL, CKMB, CKMBINDEX, TROPONINI in the last 72 hours. BNP: Invalid input(s): POCBNP D-Dimer: No results for input(s): DDIMER in the last 72 hours. Hemoglobin A1C: No results for input(s): HGBA1C in the last 72 hours. Fasting Lipid Panel: No results for input(s): CHOL, HDL, LDLCALC, TRIG, CHOLHDL, LDLDIRECT in the last 72 hours. Thyroid  Function Tests: No results for input(s): TSH, T4TOTAL, T3FREE, THYROIDAB in the last 72 hours.  Invalid input(s): FREET3 Anemia Panel: No results for input(s): VITAMINB12, FOLATE, FERRITIN, TIBC, IRON, RETICCTPCT in the last 72 hours.  No results found.   Echo LVEF 50 to 55%  TELEMETRY: Atrial fibrillation at a rate of 81 bpm:  ASSESSMENT AND PLAN:  Active Problems:   Asthma exacerbation    1.  Atrial fibrillation with RVR, initially treated with amiodarone bolus and IV, transition to amiodarone 200 mg p.o. twice daily, now on metoprolol tartrate 25 mg twice daily and diltiazem CD 300 mg daily, currently in atrial fibrillation at a rate of 81 bpm, with one 1.2-second pause, one beat run of wide-complex tachycardia, probable atrial fibrillation with aberrancy, without recurrent 2.  Asthma exacerbation, improved  Recommendations  1.  Agree with current therapy 2.  Continue amiodarone 200 mg p.o. twice daily for rate control 3.  Continue Cardizem CD for now 4.  Continue metoprolol  tartrate to 25 mg twice daily, Cardizem CD 300 mg daily 5.  No indication for temporary or permanent pacemaker at this time 6.  May discharge home 7.  Follow up with Dr. Ubaldo Glassing in 1 week   Isaias Cowman, MD, PhD, [facility] 02/25/2020 9:38 AM

## 2020-02-26 LAB — PROTIME-INR
INR: 2.2 — ABNORMAL HIGH (ref 0.8–1.2)
Prothrombin Time: 24 seconds — ABNORMAL HIGH (ref 11.4–15.2)

## 2020-02-26 LAB — GLUCOSE, CAPILLARY
Glucose-Capillary: 109 mg/dL — ABNORMAL HIGH (ref 70–99)
Glucose-Capillary: 182 mg/dL — ABNORMAL HIGH (ref 70–99)

## 2020-02-26 MED ORDER — WARFARIN SODIUM 3 MG PO TABS
3.0000 mg | ORAL_TABLET | Freq: Once | ORAL | Status: AC
  Administered 2020-02-26: 3 mg via ORAL
  Filled 2020-02-26: qty 1

## 2020-02-26 MED ORDER — AMIODARONE HCL 200 MG PO TABS
200.0000 mg | ORAL_TABLET | Freq: Two times a day (BID) | ORAL | 0 refills | Status: DC
Start: 2020-02-26 — End: 2022-06-23

## 2020-02-26 MED ORDER — MAGNESIUM OXIDE 400 (241.3 MG) MG PO TABS
400.0000 mg | ORAL_TABLET | Freq: Two times a day (BID) | ORAL | 0 refills | Status: DC
Start: 2020-02-26 — End: 2020-02-26

## 2020-02-26 MED ORDER — WARFARIN SODIUM 3 MG PO TABS
3.0000 mg | ORAL_TABLET | Freq: Every evening | ORAL | 0 refills | Status: DC
Start: 2020-02-26 — End: 2021-02-24

## 2020-02-26 MED ORDER — FUROSEMIDE 20 MG PO TABS: 20.0000 mg | ORAL_TABLET | Freq: Every day | ORAL | 0 refills | Status: DC | PRN

## 2020-02-26 NOTE — Plan of Care (Signed)
  Problem: Health Behavior/Discharge Planning: Goal: Ability to manage health-related needs will improve Outcome: Progressing   Problem: Safety: Goal: Ability to remain free from injury will improve Outcome: Progressing   Problem: Education: Goal: Knowledge of disease or condition will improve Outcome: Progressing

## 2020-02-26 NOTE — Progress Notes (Signed)
Patient discharged to home. Tele and IV d/c'd. Patient home via EMS.  Due to labor day, patient pharmacy closed. This RN spoke to Dr. Kurtis Bushman and patient given pm dose of amiodarone and coumadin before discharge.

## 2020-02-26 NOTE — TOC Transition Note (Signed)
[facility]) - CM/SW Discharge Note   Patient Details  Name: REAH JUSTO MRN: [id number] Date of Birth: 14-Oct-1940  [facility] CM/SW Contact:  Eileen Stanford, LCSW Phone Number: 02/26/2020, 2:31 PM   Clinical Narrative:   [facility] arranged through [facility]. Pt will transport home via [facility]. Pt is aware she will be billed for transport given she can ambulate. Pt is agreeable to this. [facility] has been called. RN aware. NO additional needs at this time.    Final next level of care: Home w Home Health Services Barriers to Discharge: No Barriers Identified   Patient Goals and CMS Choice     Choice offered to / list presented to : Patient  Discharge Placement                Patient to be transferred to facility by: [facility]   Patient and family notified of of transfer: 02/26/20  Discharge Plan and Services     Post Acute Care Choice: Home Health                    HH Arranged: PT, OT, RN [facility] Agency: [place]        Social Determinants of Health (SDOH) Interventions     Readmission Risk Interventions No flowsheet data found.

## 2020-02-26 NOTE — Discharge Summary (Addendum)
Laura Gonzalez DGL:[id number] DOB: Nov 08, 1940 DOA: 02/18/2020  PCP: Baxter Hire, MD  Admit date: 02/18/2020 Discharge date: 02/26/2020  Admitted From: Home Disposition: Home  Recommendations for Outpatient Follow-up:  1. Follow up with PCP in 1 week 2. Please obtain BMP/CBC in one week 3. Dr. Ubaldo Glassing in 1 week  Home Health: PT OT   Discharge Condition:Stable CODE STATUS: Full code Diet recommendation: Carb control  Brief/Interim Summary: Laura Gonzalez is a 79 y.o. female with medical history significant of chronic atrial fibrillation on Coumadin, chronic diastolic CHF, asthma on intermittent oxygen at home PRN, hypertension, hyperlipidemia, comes into the hospital with chief complaint of shortness of breath.  She felt this was consistent with her asthma attack.  She was admitted to the hospitalist. In the emergency room patient is afebrile, tachypneic, tachycardic, she is normotensive and she was so dyspneic and required to be placed on BiPAP.  Her blood work shows an elevated BNP at 192, high-sensitivity troponin negative x2, she has a slightly elevated WBC at 12.9.  INR is 4.7, SARS-CoV-2 is negative.Chest x-ray showed cardiomegaly and mild interstitial edema.  She was given Lasix, breathing treatments, steroids, she was placed on a diltiazem infusion with improvement in heart rates as she was in A. fib RVR.  She was treated for asthma exacerbation.  She was then found with A. fib RVR with ambulation and medications were adjusted and she was started on amiodarone.   Asthma exacerbation: Improved Initially was on IV steroids and tapered down to p.o. steroids. She is currently satting above 94% on room air with ambulation  Acute on chronic hypoxic respiratory failure: Likely secondary to asthma exacerbation with some acute on chronic diastolic heart failure. Weaned off of BiPAP  Now on room air   A. Fib  w/ RVR. Likely RVR from asthma exacerbation.  Now has improved. We walked the  patient today and her heart rate was less than 100. continue on Cardizem and beta-blocker  Amiodarone 200 mg twice daily On Coumadin, INR today 2.2 Was instructed to check INR in couple of days Follow-up with Dr. Ubaldo Glassing in 1 week  Hypeglycemia- likely from steroids Now sugars have improved. A1c 6.0 Needs to follow-up with primary care for further evaluation and management  Supratherapeutic INR: INR 5.2.Marland KitchenMarland Kitchen>down to 2's. Was given s/p vitamin K x1.    Acute on chronic diastolic CHF: was volume overloaded due to A. fib RVR initially .  Was given IV Lasix with improvement of volume status.  Then was euvolemic without exacerbation Now down to Lasix 20 mg as needed daily, instructed to weigh daily and if fluid weight up by 3 pounds to take Lasix Will need to follow-up with Dr. Ubaldo Glassing in 1 week     Leukocytosis: likely secondary to steroid use. 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 Would benefit from significant weight loss.  [place]   Discharge Diagnoses:  Active Problems:   HTN (hypertension)   Asthma exacerbation   Acute on chronic diastolic CHF (congestive heart failure) (HCC)   Obesity, Class III, BMI 40-49.9 (morbid obesity) ([place])   Supratherapeutic INR    Discharge Instructions  Discharge Instructions    Call MD for:  difficulty breathing, headache or visual disturbances   Complete by: As directed    Call MD for:  difficulty breathing, headache or visual disturbances   Complete by: As directed    Diet -  low sodium heart healthy   Complete by: As directed    Discharge instructions   Complete by: As directed    F/u Dr. Ubaldo Glassing in one week Check inr in 2 days.   Increase activity slowly   Complete by: As directed    Increase activity slowly   Complete by: As directed      Allergies as of 02/26/2020      Reactions   Amoxicillin Other (See Comments)    Lips swelling, tingling Has patient had a PCN reaction causing immediate rash, facial/tongue/throat swelling, SOB or lightheadedness with hypotension: Yes Has patient had a PCN reaction causing severe rash involving mucus membranes or skin necrosis: No Has patient had a PCN reaction that required hospitalization: No Has patient had a PCN reaction occurring within the last 10 years: No If all of the above answers are "NO", then may proceed with Cephalosporin use.   Benzocaine-menthol    Throat swelling   Chloraseptic Sore Throat [acetaminophen] Other (See Comments)   throat swelling   Biafine [wound Dressings] Swelling   Chlorpheniramine    Throat swelling   Neosporin [neomycin-bacitracin Zn-polymyx] Other (See Comments)   Skin redness, puffy   Shellfish Allergy Swelling   "lips swell"   Statins Other (See Comments)   Muscle weakness, weak   Tape Other (See Comments)   Skin redness      Medication List    STOP taking these medications   aspirin 81 MG chewable tablet   diphenhydrAMINE 25 mg capsule Commonly known as: BENADRYL     TAKE these medications   acetaminophen 500 MG tablet Commonly known as: TYLENOL Take 1,000 mg by mouth every 6 (six) hours as needed.   alendronate 70 MG tablet Commonly known as: FOSAMAX Take 70 mg by mouth every Sunday. Take with a full glass of water on an empty stomach. On Sundays   amiodarone 200 MG tablet Commonly known as: PACERONE Take 1 tablet (200 mg total) by mouth 2 (two) times daily.   AYR SALINE NASAL GEL NA Place 1 application into the nose as needed (congestion).   B-12 3000 MCG Caps Take 3,000 mcg by mouth daily.   calcium carbonate 500 MG chewable tablet Commonly known as: TUMS - dosed in mg elemental calcium Chew 1 tablet (200 mg of elemental calcium total) by mouth 2 (two) times daily.   CITRACAL PO Take 1 tablet by mouth 2 (two) times daily.   CoQ-10 100 MG capsule Take 200 mg by mouth daily.   diltiazem 300  MG 24 hr capsule Commonly known as: CARDIZEM CD Take 1 capsule (300 mg total) by mouth daily.   diltiazem 60 MG tablet Commonly known as: CARDIZEM Take 1 tablet (60 mg total) by mouth daily as needed (AFIB).   ezetimibe 10 MG tablet Commonly known as: ZETIA Take 10 mg by mouth daily.   feeding supplement (ENSURE ENLIVE) Liqd Take 237 mLs by mouth 2 (two) times daily between meals.   fluticasone 50 MCG/ACT nasal spray Commonly known as: FLONASE Place 1 spray into both nostrils daily as needed for allergies.   furosemide 20 MG tablet Commonly known as: LASIX Take 1 tablet (20 mg total) by mouth daily as needed for fluid (weight daily if water weight up by 3lbs take it). What changed:   medication strength  how much to take  reasons to take this   ipratropium 0.02 % nebulizer solution Commonly known as: ATROVENT Take 2.5 mLs (0.5 mg total) by nebulization 3 (  three) times daily.   levalbuterol 0.63 MG/3ML nebulizer solution Commonly known as: XOPENEX Take 5.9524 mLs (1.25 mg total) by nebulization 3 (three) times daily.   loratadine 10 MG tablet Commonly known as: CLARITIN Take 10 mg by mouth daily.   magnesium oxide 400 MG tablet Commonly known as: MAG-OX Take 800 mg by mouth every evening.   MegaRed Omega-3 Krill Oil 500 MG Caps Take 500 mg by mouth every morning.   metoprolol tartrate 50 MG tablet Commonly known as: LOPRESSOR Take 1 tablet (50 mg total) by mouth 2 (two) times daily.   montelukast 10 MG tablet Commonly known as: SINGULAIR Take 10 mg by mouth at bedtime.   ONE-A-DAY WOMENS PO Take 1 tablet by mouth 2 (two) times daily.   polyethylene glycol 17 g packet Commonly known as: MIRALAX / GLYCOLAX Take 17 g by mouth daily as needed for mild constipation.   potassium chloride SA 20 MEQ tablet Commonly known as: KLOR-CON Take 20 mEq by mouth 2 (two) times daily.   Probiotic Caps Take 1 capsule by mouth daily.   SYSTANE OP Place 1 drop into  both eyes at bedtime as needed (allergies).   traMADol 50 MG tablet Commonly known as: ULTRAM Take 50 mg by mouth every 6 (six) hours as needed.   trolamine salicylate 10 % cream Commonly known as: ASPERCREME Apply 1 application topically 3 (three) times daily as needed (knee pain).   Vitamin D-3 125 MCG (5000 UT) Tabs Take 2,000 Units by mouth daily.   warfarin 3 MG tablet Commonly known as: COUMADIN Take 1 tablet (3 mg total) by mouth every evening. What changed:   medication strength  how much to take       Follow-up Information    Teodoro Spray, MD Follow up in 1 week(s).   Specialty: Cardiology Contact information: [place] [address] [phone]        Baxter Hire, MD Follow up in 3 day(s).   Specialty: Internal Medicine Why: needs inr Contact information: [place] [address] [phone]              Allergies  Allergen Reactions  . Amoxicillin Other (See Comments)    Lips swelling, tingling Has patient had a PCN reaction causing immediate rash, facial/tongue/throat swelling, SOB or lightheadedness with hypotension: Yes Has patient had a PCN reaction causing severe rash involving mucus membranes or skin necrosis: No Has patient had a PCN reaction that required hospitalization: No Has patient had a PCN reaction occurring within the last 10 years: No If all of the above answers are "NO", then may proceed with Cephalosporin use.   . Benzocaine-Menthol     Throat swelling  . Chloraseptic Sore Throat [Acetaminophen] Other (See Comments)    throat swelling  . Biafine [Wound Dressings] Swelling  . Chlorpheniramine     Throat swelling  . Neosporin [Neomycin-Bacitracin Zn-Polymyx] Other (See Comments)    Skin redness, puffy  . Shellfish Allergy Swelling    "lips swell"  . Statins Other (See Comments)    Muscle weakness, weak  . Tape Other (See Comments)    Skin redness     Consultations:  Cardiology   Procedures/Studies: DG Chest 2 View  Result Date: 02/18/2020 CLINICAL DATA:  Shortness of breath EXAM: CHEST - 2 VIEW COMPARISON:  July 06, 2019 FINDINGS: The heart size and mediastinal contour are unchanged with cardiomegaly. There is prominence of the central pulmonary vasculature with mildly increased  interstitial markings. There is blunting of the left costophrenic angle, likely layering effusion. IMPRESSION: Cardiomegaly and mild interstitial edema. Probable small left layering pleural effusion. Electronically Signed   By: Prudencio Pair M.D.   On: 02/18/2020 19:12      Subjective: Feels well.  Ambulated with heart rate controlled less than 100 and she was asymptomatic.  Has no shortness of breath, palpitations or chest pain  Discharge Exam: Vitals:   02/26/20 1020 02/26/20 1204  BP: 102/62 94/61  Pulse: 69 71  Resp:  18  Temp:  (!) 97.5 F (36.4 C)  SpO2: 97% 94%   Vitals:   02/26/20 0802 02/26/20 0922 02/26/20 1020 02/26/20 1204  BP: (!) 104/59  102/62 94/61  Pulse: 74  69 71  Resp: 20   18  Temp: 97.9 F (36.6 C)   (!) 97.5 F (36.4 C)  TempSrc: Oral   Oral  SpO2: 91% 97% 97% 94%  Weight:      Height:        General: Pt is alert, awake, not in acute distress Cardiovascular: RRR, S1/S2 +, no rubs, no gallops Respiratory: CTA bilaterally, no wheezing, no rhonchi Abdominal: Soft, NT, ND, bowel sounds + Extremities: no edema, no cyanosis    The results of significant diagnostics from this hospitalization (including imaging, microbiology, ancillary and laboratory) are listed below for reference.     Microbiology: Recent Results (from the past 240 hour(s))  SARS Coronavirus 2 by RT PCR (hospital order, performed in [facility] hospital lab) Nasopharyngeal Nasopharyngeal Swab     Status: None   Collection Time: 02/18/20  8:39 PM   Specimen: Nasopharyngeal Swab  Result Value Ref Range Status   SARS Coronavirus 2 NEGATIVE  NEGATIVE Final    Comment: (NOTE) SARS-CoV-2 target nucleic acids are NOT DETECTED.  The SARS-CoV-2 RNA is generally detectable in upper and lower respiratory specimens during the acute phase of infection. The lowest concentration of SARS-CoV-2 viral copies this assay can detect is 250 copies / mL. A negative result does not preclude SARS-CoV-2 infection and should not be used as the sole basis for treatment or other patient management decisions.  A negative result may occur with improper specimen collection / handling, submission of specimen other than nasopharyngeal swab, presence of viral mutation(s) within the areas targeted by this assay, and inadequate number of viral copies (<250 copies / mL). A negative result must be combined with clinical observations, patient history, and epidemiological information.  Fact Sheet for Patients:   StrictlyIdeas.no  Fact Sheet for Healthcare Providers: BankingDealers.co.za  This test is not yet approved or  cleared by the [place] FDA and has been authorized for detection and/or diagnosis of SARS-CoV-2 by FDA under an Emergency Use Authorization (EUA).  This EUA will remain in effect (meaning this test can be used) for the duration of the COVID-19 declaration under Section 564(b)(1) of the Act, 21 U.S.C. section 360bbb-3(b)(1), unless the authorization is terminated or revoked sooner.  Performed at [facility], [address] [place], [place] [address]      Labs: BNP (last 3 results) Recent Labs    07/06/19 1627 02/18/20 1909 02/22/20 0459  BNP 218.0* 182.3* 893.8*   Basic Metabolic Panel: Recent Labs  Lab 02/20/20 0202 02/21/20 0535 02/23/20 0352  NA 139 141 138  K 3.8 3.8 4.1  CL 96* 97* 95*  CO2 30 31 33*  GLUCOSE 151* 230* 155*  BUN 31* 40* 40*  CREATININE 0.93 0.90 0.67  CALCIUM  8.8* 9.7 9.2   Liver Function Tests: No results for input(s): AST, ALT,  ALKPHOS, BILITOT, PROT, ALBUMIN in the last 168 hours. No results for input(s): LIPASE, AMYLASE in the last 168 hours. No results for input(s): AMMONIA in the last 168 hours. CBC: Recent Labs  Lab 02/20/20 0202 02/21/20 0535 02/23/20 0352 02/24/20 0437  WBC 17.6* 15.5* 12.6* 15.0*  HGB 14.0 14.8 13.7 13.6  HCT 43.6 45.9 42.3 40.9  MCV 93.0 93.9 91.0 88.3  PLT 231 266 239 253   Cardiac Enzymes: No results for input(s): CKTOTAL, CKMB, CKMBINDEX, TROPONINI in the last 168 hours. BNP: Invalid input(s): POCBNP CBG: Recent Labs  Lab 02/25/20 1641 02/25/20 1721 02/25/20 2052 02/26/20 0758 02/26/20 1159  GLUCAP 298* 299* 248* 109* 182*   D-Dimer No results for input(s): DDIMER in the last 72 hours. Hgb A1c No results for input(s): HGBA1C in the last 72 hours. Lipid Profile No results for input(s): CHOL, HDL, LDLCALC, TRIG, CHOLHDL, LDLDIRECT in the last 72 hours. Thyroid function studies No results for input(s): TSH, T4TOTAL, T3FREE, THYROIDAB in the last 72 hours.  Invalid input(s): FREET3 Anemia work up No results for input(s): VITAMINB12, FOLATE, FERRITIN, TIBC, IRON, RETICCTPCT in the last 72 hours. Urinalysis    Component Value Date/Time   COLORURINE YELLOW (A) 04/06/2018 1922   APPEARANCEUR CLEAR (A) 04/06/2018 1922   APPEARANCEUR Clear 05/30/2014 1003   LABSPEC 1.018 04/06/2018 1922   LABSPEC 1.015 05/30/2014 1003   PHURINE 6.0 04/06/2018 1922   GLUCOSEU NEGATIVE 04/06/2018 1922   GLUCOSEU Negative 05/30/2014 1003   HGBUR NEGATIVE 04/06/2018 1922   BILIRUBINUR NEGATIVE 04/06/2018 1922   BILIRUBINUR Negative 05/30/2014 1003   KETONESUR NEGATIVE 04/06/2018 1922   PROTEINUR NEGATIVE 04/06/2018 1922   UROBILINOGEN 0.2 08/02/2012 0941   NITRITE NEGATIVE 04/06/2018 1922   LEUKOCYTESUR NEGATIVE 04/06/2018 1922   LEUKOCYTESUR 1+ 05/30/2014 1003   Sepsis Labs Invalid input(s): PROCALCITONIN,  WBC,  LACTICIDVEN Microbiology Recent Results (from the past 240  hour(s))  SARS Coronavirus 2 by RT PCR (hospital order, performed in [facility] hospital lab) Nasopharyngeal Nasopharyngeal Swab     Status: None   Collection Time: 02/18/20  8:39 PM   Specimen: Nasopharyngeal Swab  Result Value Ref Range Status   SARS Coronavirus 2 NEGATIVE NEGATIVE Final    Comment: (NOTE) SARS-CoV-2 target nucleic acids are NOT DETECTED.  The SARS-CoV-2 RNA is generally detectable in upper and lower respiratory specimens during the acute phase of infection. The lowest concentration of SARS-CoV-2 viral copies this assay can detect is 250 copies / mL. A negative result does not preclude SARS-CoV-2 infection and should not be used as the sole basis for treatment or other patient management decisions.  A negative result may occur with improper specimen collection / handling, submission of specimen other than nasopharyngeal swab, presence of viral mutation(s) within the areas targeted by this assay, and inadequate number of viral copies (<250 copies / mL). A negative result must be combined with clinical observations, patient history, and epidemiological information.  Fact Sheet for Patients:   StrictlyIdeas.no  Fact Sheet for Healthcare Providers: BankingDealers.co.za  This test is not yet approved or  cleared by the [place] FDA and has been authorized for detection and/or diagnosis of SARS-CoV-2 by FDA under an Emergency Use Authorization (EUA).  This EUA will remain in effect (meaning this test can be used) for the duration of the COVID-19 declaration under Section 564(b)(1) of the Act, 21 U.S.C. section 360bbb-3(b)(1), unless the authorization is  terminated or revoked sooner.  Performed at [facility], [address] [place], [place] [address]      Time coordinating discharge: Over 30 minutes  SIGNED:   Nolberto Hanlon, MD  [facility] 02/26/2020, 2:15 PM Pager   If 7PM-7AM, please  contact night-coverage [email or web address] Password [email or web address]

## 2020-02-26 NOTE — TOC Transition Note (Signed)
Transition of Care [facility]) - CM/SW Discharge Note   Patient Details  Name: Laura Gonzalez MRN: [id number] Date of Birth: August 31, 1940  [facility]) CM/SW Contact:  Eileen Stanford, LCSW Phone Number: 02/26/2020, 12:32 PM   Clinical Narrative:   Pt d/c home today. HH arranged through [facility]. NO additional needs at this time.    Final next level of care: Home w Home Health Services Barriers to Discharge: No Barriers Identified   Patient Goals and CMS Choice     Choice offered to / list presented to : Patient  Discharge Placement                Patient to be transferred to facility by: personal vehicle   Patient and family notified of of transfer: 02/26/20  Discharge Plan and Services     Post Acute Care Choice: Home Health                               Social Determinants of Health (SDOH) Interventions     Readmission Risk Interventions No flowsheet data found.

## 2020-02-26 NOTE — Progress Notes (Signed)
[facility] Cardiology  SUBJECTIVE: Patient sitting in chair, reports feeling better, wishes to go home   Vitals:   02/25/20 2133 02/26/20 0501 02/26/20 0802 02/26/20 0922  BP: (!) 126/59 125/69 (!) 104/59   Pulse: 98 84 74   Resp:   20   Temp: 97.6 F (36.4 C) 97.6 F (36.4 C) 97.9 F (36.6 C)   TempSrc: Oral Oral Oral   SpO2: 94% 91% 91% 97%  Weight:  128.1 kg    Height:         Intake/Output Summary (Last 24 hours) at 02/26/2020 1011 Last data filed at 02/26/2020 0945 Gross per 24 hour  Intake 477 ml  Output 2100 ml  Net -1623 ml      PHYSICAL EXAM  General: Well developed, well nourished, in no acute distress HEENT:  Normocephalic and atramatic Neck:  No JVD.  Lungs: Clear bilaterally to auscultation and percussion. Heart: HRRR . Normal S1 and S2 without gallops or murmurs.  Abdomen: Bowel sounds are positive, abdomen soft and non-tender  Msk:  Back normal, normal gait. Normal strength and tone for age. Extremities: No clubbing, cyanosis or edema.   Neuro: Alert and oriented X 3. Psych:  Good affect, responds appropriately   LABS: Basic Metabolic Panel: No results for input(s): NA, K, CL, CO2, GLUCOSE, BUN, CREATININE, CALCIUM, MG, PHOS in the last 72 hours. Liver Function Tests: No results for input(s): AST, ALT, ALKPHOS, BILITOT, PROT, ALBUMIN in the last 72 hours. No results for input(s): LIPASE, AMYLASE in the last 72 hours. CBC: Recent Labs    02/24/20 0437  WBC 15.0*  HGB 13.6  HCT 40.9  MCV 88.3  PLT 253   Cardiac Enzymes: No results for input(s): CKTOTAL, CKMB, CKMBINDEX, TROPONINI in the last 72 hours. BNP: Invalid input(s): POCBNP D-Dimer: No results for input(s): DDIMER in the last 72 hours. Hemoglobin A1C: No results for input(s): HGBA1C in the last 72 hours. Fasting Lipid Panel: No results for input(s): CHOL, HDL, LDLCALC, TRIG, CHOLHDL, LDLDIRECT in the last 72 hours. Thyroid Function Tests: No results for input(s): TSH, T4TOTAL, T3FREE,  THYROIDAB in the last 72 hours.  Invalid input(s): FREET3 Anemia Panel: No results for input(s): VITAMINB12, FOLATE, FERRITIN, TIBC, IRON, RETICCTPCT in the last 72 hours.  No results found.   Echo LVEF 50 to 55%  TELEMETRY: Atrial fibrillation at a rate of 76 bpm:  ASSESSMENT AND PLAN:  Active Problems:   HTN (hypertension)   Asthma exacerbation   Acute on chronic diastolic CHF (congestive heart failure) (HCC)   Obesity, Class III, BMI 40-49.9 (morbid obesity) (HCC)   Supratherapeutic INR    1. Atrial fibrillation with RVR, initially treated with amiodarone bolus and IV, transition to amiodarone 200 mg p.o. twice daily, now on metoprolol tartrate 50 mg twice daily and diltiazem CD 300 mg daily, currently in atrial fibrillation at a rate of 76 bpm, withone1.2-second pause, one beat run of wide-complex tachycardia, probable atrial fibrillation with aberrancy, without recurrence, patient wishes to go home 2.Asthma exacerbation, improved  Recommendations  1.Agree with current therapy 2.Continue amiodarone 200 mg p.o. twice daily for rate control 3.Continue Cardizem CD 100 mg daily 4.Continue metoprolol  tartrate 50 mg twice daily 5.No indication for temporary or permanent pacemaker at this time 6.  May discharge home today 7.  Follow up with Dr. Ubaldo Glassing in 1 week  Sign off for now, please call if any questions   Isaias Cowman, MD, PhD, [facility] 02/26/2020 10:11 AM

## 2020-02-26 NOTE — Progress Notes (Signed)
Pt ambulated in hall around unit using walker with RN. Resting heart in chair prior to ambulation 70s. Upon ambulating patient HR got up to 109. Toward end of ambulation, patient c/o "tightness in chest". This is not new for patient. Patient back in chair resting. Heart rate has returned to the 70s. Will continue to monitor patient.

## 2020-02-26 NOTE — Care Management Important Message (Signed)
Important Message  Patient Details  Name: Laura Gonzalez MRN: [id number] Date of Birth: Feb 18, 1941   Medicare Important Message Given:  Yes     [name] 02/26/2020, 1:15 PM
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
## 2020-10-30 ENCOUNTER — Other Ambulatory Visit: Payer: Self-pay | Admitting: Family Medicine

## 2020-10-30 ENCOUNTER — Other Ambulatory Visit (HOSPITAL_COMMUNITY): Payer: Self-pay | Admitting: Family Medicine

## 2020-10-30 DIAGNOSIS — M5416 Radiculopathy, lumbar region: Secondary | ICD-10-CM

## 2020-11-13 ENCOUNTER — Other Ambulatory Visit: Payer: Self-pay

## 2020-11-13 ENCOUNTER — Ambulatory Visit
Admission: RE | Admit: 2020-11-13 | Discharge: 2020-11-13 | Disposition: A | Discharge status: Home / Self Care | Payer: Medicare Other | Source: Ambulatory Visit | Attending: Family Medicine | Admitting: Family Medicine

## 2020-11-13 DIAGNOSIS — M5416 Radiculopathy, lumbar region: Secondary | ICD-10-CM | POA: Insufficient documentation

## 2021-02-07 ENCOUNTER — Encounter: Payer: Self-pay | Admitting: Emergency Medicine

## 2021-02-07 ENCOUNTER — Other Ambulatory Visit: Payer: Self-pay

## 2021-02-07 ENCOUNTER — Emergency Department: Payer: Medicare Other

## 2021-02-07 ENCOUNTER — Emergency Department
Admission: EM | Admit: 2021-02-07 | Discharge: 2021-02-07 | Disposition: A | Discharge status: Home / Self Care | Payer: Medicare Other | Attending: Emergency Medicine | Admitting: Emergency Medicine

## 2021-02-07 DIAGNOSIS — M25571 Pain in right ankle and joints of right foot: Secondary | ICD-10-CM | POA: Diagnosis not present

## 2021-02-07 DIAGNOSIS — W19XXXA Unspecified fall, initial encounter: Secondary | ICD-10-CM

## 2021-02-07 DIAGNOSIS — W1830XA Fall on same level, unspecified, initial encounter: Secondary | ICD-10-CM | POA: Diagnosis not present

## 2021-02-07 DIAGNOSIS — Z85820 Personal history of malignant melanoma of skin: Secondary | ICD-10-CM | POA: Insufficient documentation

## 2021-02-07 DIAGNOSIS — Y92009 Unspecified place in unspecified non-institutional (private) residence as the place of occurrence of the external cause: Secondary | ICD-10-CM | POA: Diagnosis not present

## 2021-02-07 DIAGNOSIS — Z7901 Long term (current) use of anticoagulants: Secondary | ICD-10-CM | POA: Diagnosis not present

## 2021-02-07 DIAGNOSIS — Z79899 Other long term (current) drug therapy: Secondary | ICD-10-CM | POA: Diagnosis not present

## 2021-02-07 DIAGNOSIS — I5032 Chronic diastolic (congestive) heart failure: Secondary | ICD-10-CM | POA: Diagnosis not present

## 2021-02-07 DIAGNOSIS — I110 Hypertensive heart disease with heart failure: Secondary | ICD-10-CM | POA: Diagnosis not present

## 2021-02-07 DIAGNOSIS — M25561 Pain in right knee: Secondary | ICD-10-CM | POA: Diagnosis not present

## 2021-02-07 DIAGNOSIS — Z96651 Presence of right artificial knee joint: Secondary | ICD-10-CM | POA: Diagnosis not present

## 2021-02-07 DIAGNOSIS — M79604 Pain in right leg: Secondary | ICD-10-CM

## 2021-02-07 DIAGNOSIS — J45901 Unspecified asthma with (acute) exacerbation: Secondary | ICD-10-CM | POA: Insufficient documentation

## 2021-02-07 MED ORDER — ONDANSETRON 4 MG PO TBDP
4.0000 mg | ORAL_TABLET | Freq: Once | ORAL | Status: AC
  Administered 2021-02-07: 4 mg via ORAL
  Filled 2021-02-07: qty 1

## 2021-02-07 MED ORDER — TRAMADOL HCL 50 MG PO TABS
50.0000 mg | ORAL_TABLET | Freq: Once | ORAL | Status: AC
  Administered 2021-02-07: 50 mg via ORAL
  Filled 2021-02-07: qty 1

## 2021-02-07 MED ORDER — TRAMADOL HCL 50 MG PO TABS: 50.0000 mg | ORAL_TABLET | Freq: Four times a day (QID) | ORAL | 0 refills | Status: DC | PRN

## 2021-02-07 MED ORDER — HYDROCODONE-ACETAMINOPHEN 5-325 MG PO TABS
2.0000 | ORAL_TABLET | Freq: Once | ORAL | Status: AC
  Administered 2021-02-07: 2 via ORAL
  Filled 2021-02-07: qty 2

## 2021-02-07 NOTE — ED Notes (Signed)
Pt had multiple questions for provider, delaying discharge

## 2021-02-07 NOTE — NC FL2 (Signed)
[place] LEVEL OF CARE SCREENING TOOL     IDENTIFICATION  Patient Name: Laura Gonzalez Birthdate: Apr 12, 1941 Sex: female Admission Date (Current Location): 02/07/2021  [facility] and [place] Number:  Engineering geologist and Address:  [facility], [address] [place], [place] [address]      Provider Number: [id number]  Attending Physician Name and Address:  No att. providers found  Relative Name and Phone Number:  Marianna Fuss Denman George)   [phone] [facility])    Current Level of Care: SNF Recommended Level of Care: [place] Prior Approval Number:    Date Approved/Denied:   PASRR Number:    Discharge Plan: SNF    Current Diagnoses: Patient Active Problem List   Diagnosis Date Noted   Acute on chronic diastolic CHF (congestive heart failure) ([place]) 02/25/2020   Obesity, Class III, BMI 40-49.9 (morbid obesity) ([place]) 02/25/2020   Supratherapeutic INR 02/25/2020   Asthma exacerbation 02/18/2020   Acute respiratory failure with hypoxia ([place]) 07/06/2019   Chest pain 12/29/2018   Chronic diastolic heart failure ([place]) 04/22/2018   Atrial fibrillation with RVR ([place]) 04/06/2018   PAD (peripheral artery disease) ([place]) 09/22/2017   AV malformation, 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   Hemarthrosis involving knee joint, right 08/17/2017   Varicose veins of bilateral lower extremities with other complications 0000000   Chronic venous insufficiency 08/17/2017   Right knee pain 07/25/2017   Gait instability 07/23/2017   Persistent atrial fibrillation ([place]) 12/11/2014   HTN (hypertension) 12/11/2014   GERD (gastroesophageal reflux disease) 12/11/2014   Asthma 12/11/2014    Orientation RESPIRATION BLADDER Height & Weight     Self, Time, Situation, Place  Normal Continent Weight: 287 lb (130.2 kg) Height:  '5\' 5"'$  (165.1 cm)  BEHAVIORAL SYMPTOMS/MOOD NEUROLOGICAL BOWEL NUTRITION STATUS      Continent Diet  AMBULATORY STATUS  COMMUNICATION OF NEEDS Skin   Limited Assist Verbally Normal                       Personal Care Assistance Level of Assistance  Bathing, Feeding, Total care, Dressing Bathing Assistance: Limited assistance Feeding assistance: Independent Dressing Assistance: Limited assistance Total Care Assistance: Limited assistance   Functional Limitations Info  Sight, Speech, Hearing Sight Info: Adequate Hearing Info: Adequate Speech Info: Adequate    SPECIAL CARE FACTORS FREQUENCY  PT (By licensed PT), OT (By licensed OT)     PT Frequency: 5X per week OT Frequency: 5X per week            Contractures Contractures Info: Not present    Additional Factors Info            Moderna COVID-19 Vaccine 08/01/2019 , 07/04/2019         Current Medications (02/07/2021):  This is the current hospital active medication list No current facility-administered medications for this encounter.   Current Outpatient Medications  Medication Sig Dispense Refill   acetaminophen (TYLENOL) 500 MG tablet Take 1,000 mg by mouth every 6 (six) hours as needed.     alendronate (FOSAMAX) 70 MG tablet Take 70 mg by mouth every Sunday. Take with a full glass of water on an empty stomach. On Sundays      Aloe-Sodium Chloride (AYR SALINE NASAL GEL NA) Place 1 application into the nose as needed (congestion).      amiodarone (PACERONE) 200 MG tablet Take 1 tablet (200 mg total) by mouth 2 (two) times daily. (Patient taking differently: Take 200 mg by  mouth daily.) 60 tablet 0   ammonium lactate (AMLACTIN) 12 % cream Apply 1 application topically daily as needed.     calcium carbonate (TUMS - DOSED IN MG ELEMENTAL CALCIUM) 500 MG chewable tablet Chew 1 tablet (200 mg of elemental calcium total) by mouth 2 (two) times daily. 30 tablet 0   Calcium Citrate (CITRACAL PO) Take 1 tablet by mouth 2 (two) times daily.      Cholecalciferol (VITAMIN D-3) 5000 units TABS Take 2,000 Units by mouth daily.      Coenzyme Q10  (COQ-10) 100 MG capsule Take 200 mg by mouth daily.     Cyanocobalamin (B-12) 3000 MCG CAPS Take 3,000 mcg by mouth daily.      diltiazem (CARDIZEM CD) 300 MG 24 hr capsule Take 1 capsule (300 mg total) by mouth daily. 60 capsule 0   diltiazem (CARDIZEM) 60 MG tablet Take 1 tablet (60 mg total) by mouth daily as needed (AFIB). 15 tablet 0   ELIQUIS 5 MG TABS tablet Take 1 tablet by mouth 2 (two) times daily.     ezetimibe (ZETIA) 10 MG tablet Take 10 mg by mouth daily.      fluticasone (FLONASE) 50 MCG/ACT nasal spray Place 1 spray into both nostrils daily as needed for allergies.      furosemide (LASIX) 20 MG tablet Take 1 tablet (20 mg total) by mouth daily as needed for fluid (weight daily if water weight up by 3lbs take it). 30 tablet 0   ipratropium (ATROVENT) 0.02 % nebulizer solution Take 2.5 mLs (0.5 mg total) by nebulization 3 (three) times daily. 75 mL 1   levalbuterol (XOPENEX) 0.63 MG/3ML nebulizer solution Take 5.9524 mLs (1.25 mg total) by nebulization 3 (three) times daily. 3 mL 1   loratadine (CLARITIN) 10 MG tablet Take 10 mg by mouth daily.      magnesium oxide (MAG-OX) 400 MG tablet Take 800 mg by mouth every evening.      MEGARED OMEGA-3 KRILL OIL 500 MG CAPS Take 500 mg by mouth every morning.      metoprolol tartrate (LOPRESSOR) 50 MG tablet Take 1 tablet (50 mg total) by mouth 2 (two) times daily. 60 tablet 1   montelukast (SINGULAIR) 10 MG tablet Take 10 mg by mouth at bedtime.      Multiple Vitamins-Calcium (ONE-A-DAY WOMENS PO) Take 1 tablet by mouth 2 (two) times daily.      Polyethyl Glycol-Propyl Glycol (SYSTANE OP) Place 1 drop into both eyes at bedtime as needed (allergies).     polyethylene glycol (MIRALAX / GLYCOLAX) packet Take 17 g by mouth daily as needed for mild constipation.      potassium chloride SA (KLOR-CON) 20 MEQ tablet Take 20 mEq by mouth 2 (two) times daily.     Probiotic CAPS Take 1 capsule by mouth daily.     traMADol (ULTRAM) 50 MG tablet Take 1  tablet (50 mg total) by mouth every 6 (six) hours as needed. 20 tablet 0   triamcinolone cream (KENALOG) 0.1 % Apply 1 application topically daily.     trolamine salicylate (ASPERCREME) 10 % cream Apply 1 application topically 3 (three) times daily as needed (knee pain).      feeding supplement, ENSURE ENLIVE, (ENSURE ENLIVE) LIQD Take 237 mLs by mouth 2 (two) times daily between meals. 237 mL 11   warfarin (COUMADIN) 3 MG tablet Take 1 tablet (3 mg total) by mouth every evening. (Patient not taking: No sig reported) 30 tablet 0  Discharge Medications: Please see discharge summary for a list of discharge medications.  Relevant Imaging Results:  Relevant Lab Results:   Additional Information [id number]  Adelene Amas, LCSWA

## 2021-02-07 NOTE — TOC Initial Note (Signed)
Transition of Care [facility]) - Initial/Assessment Note    Patient Details  Name: LAVENE KURIEN MRN: [id number] Date of Birth: March 09, 1941  [facility] of [facility]) CM/SW Contact:    Ova Freshwater Phone Number: [phone] 02/07/2021, 11:05 AM  Clinical Narrative:                  Patient will d/c to [facility], confirmed by [place], room# 354, report# [phone].  CSW wil lfax FL2 to [phone].  [facility] will send transportation at [employer].  EDP/ED Staff updated.   Barriers to Discharge: No Barriers Identified   Patient Goals and CMS Choice        Expected Discharge Plan and Services                                                Prior Living Arrangements/Services                       Activities of Daily Living      Permission Sought/Granted                  Emotional Assessment              Admission diagnosis:  EMS Rt Ankle Injury Patient Active Problem List   Diagnosis Date Noted   Acute on chronic diastolic CHF (congestive heart failure) ([place]) 02/25/2020   Obesity, Class III, BMI 40-49.9 (morbid obesity) ([place]) 02/25/2020   Supratherapeutic INR 02/25/2020   Asthma exacerbation 02/18/2020   Acute respiratory failure with hypoxia ([place]) 07/06/2019   Chest pain 12/29/2018   Chronic diastolic heart failure ([place]) 04/22/2018   Atrial fibrillation with RVR ([place]) 04/06/2018   PAD (peripheral artery disease) ([place]) 09/22/2017   AV malformation, 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 [place] [address] Phone: [phone] Fax: [place] [id number] [place], [place] - [address] AT [facility] [address] [place] [address] Phone: [phone] Fax: [place], [place] - [address] [place] [address] Phone: [phone] Fax: [phone]     Social Determinants of Health (SDOH) Interventions    Readmission Risk Interventions No flowsheet data found.
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
## 2021-02-07 NOTE — ED Triage Notes (Signed)
EMS brings pt in from home for mechanical fall tonight after getting up to go to [facility]; c/o rt ankle pain since; st that she has bad knees and they gave out causing her to twist her ankle

## 2021-02-19 ENCOUNTER — Emergency Department: Payer: Medicare Other

## 2021-02-19 ENCOUNTER — Other Ambulatory Visit: Payer: Self-pay

## 2021-02-19 ENCOUNTER — Encounter: Payer: Self-pay | Admitting: Emergency Medicine

## 2021-02-19 ENCOUNTER — Emergency Department
Admission: EM | Admit: 2021-02-19 | Discharge: 2021-02-19 | Disposition: A | Discharge status: Home / Self Care | Payer: Medicare Other | Attending: Emergency Medicine | Admitting: Emergency Medicine

## 2021-02-19 DIAGNOSIS — I5033 Acute on chronic diastolic (congestive) heart failure: Secondary | ICD-10-CM | POA: Insufficient documentation

## 2021-02-19 DIAGNOSIS — M79601 Pain in right arm: Secondary | ICD-10-CM | POA: Diagnosis not present

## 2021-02-19 DIAGNOSIS — M542 Cervicalgia: Secondary | ICD-10-CM | POA: Diagnosis not present

## 2021-02-19 DIAGNOSIS — I110 Hypertensive heart disease with heart failure: Secondary | ICD-10-CM | POA: Diagnosis not present

## 2021-02-19 DIAGNOSIS — M549 Dorsalgia, unspecified: Secondary | ICD-10-CM | POA: Insufficient documentation

## 2021-02-19 DIAGNOSIS — Z96651 Presence of right artificial knee joint: Secondary | ICD-10-CM | POA: Diagnosis not present

## 2021-02-19 DIAGNOSIS — S0990XA Unspecified injury of head, initial encounter: Secondary | ICD-10-CM | POA: Insufficient documentation

## 2021-02-19 DIAGNOSIS — Z79899 Other long term (current) drug therapy: Secondary | ICD-10-CM | POA: Diagnosis not present

## 2021-02-19 DIAGNOSIS — I4891 Unspecified atrial fibrillation: Secondary | ICD-10-CM | POA: Insufficient documentation

## 2021-02-19 DIAGNOSIS — W19XXXA Unspecified fall, initial encounter: Secondary | ICD-10-CM

## 2021-02-19 DIAGNOSIS — S7002XA Contusion of left hip, initial encounter: Secondary | ICD-10-CM | POA: Insufficient documentation

## 2021-02-19 DIAGNOSIS — W1839XA Other fall on same level, initial encounter: Secondary | ICD-10-CM | POA: Diagnosis not present

## 2021-02-19 DIAGNOSIS — S8001XA Contusion of right knee, initial encounter: Secondary | ICD-10-CM | POA: Insufficient documentation

## 2021-02-19 DIAGNOSIS — Z7901 Long term (current) use of anticoagulants: Secondary | ICD-10-CM | POA: Insufficient documentation

## 2021-02-19 DIAGNOSIS — S9031XA Contusion of right foot, initial encounter: Secondary | ICD-10-CM | POA: Insufficient documentation

## 2021-02-19 DIAGNOSIS — S99921A Unspecified injury of right foot, initial encounter: Secondary | ICD-10-CM | POA: Diagnosis present

## 2021-02-19 DIAGNOSIS — S9032XA Contusion of left foot, initial encounter: Secondary | ICD-10-CM | POA: Insufficient documentation

## 2021-02-19 DIAGNOSIS — J45909 Unspecified asthma, uncomplicated: Secondary | ICD-10-CM | POA: Diagnosis not present

## 2021-02-19 DIAGNOSIS — Z85828 Personal history of other malignant neoplasm of skin: Secondary | ICD-10-CM | POA: Diagnosis not present

## 2021-02-19 NOTE — ED Notes (Signed)
Spoke with patients facility regarding discharge instructions. Sending someone to pick patient up and take back to facility.

## 2021-02-19 NOTE — Discharge Instructions (Addendum)
Your CT scans and x-rays did not show any acute injuries.  Please follow-up with your primary care provider as needed.

## 2021-02-19 NOTE — ED Triage Notes (Signed)
Per EMS patient coming from [facility] at [facility] had mechanical fall c/o right leg pain, left elbow pain and back pain. Patient A&Ox4.

## 2021-02-19 NOTE — ED Notes (Signed)
Patient transported to X-ray 

## 2021-02-22 ENCOUNTER — Inpatient Hospital Stay
Admission: EM | Admit: 2021-02-22 | Discharge: 2021-02-26 | DRG: 689 | Disposition: A | Discharge status: Skilled Nursing Facility | Payer: Medicare Other | Attending: Internal Medicine | Admitting: Internal Medicine

## 2021-02-22 DIAGNOSIS — J45909 Unspecified asthma, uncomplicated: Secondary | ICD-10-CM | POA: Diagnosis present

## 2021-02-22 DIAGNOSIS — K219 Gastro-esophageal reflux disease without esophagitis: Secondary | ICD-10-CM | POA: Diagnosis present

## 2021-02-22 DIAGNOSIS — Z6841 Body Mass Index (BMI) 40.0 and over, adult: Secondary | ICD-10-CM

## 2021-02-22 DIAGNOSIS — Z9012 Acquired absence of left breast and nipple: Secondary | ICD-10-CM

## 2021-02-22 DIAGNOSIS — Z91048 Other nonmedicinal substance allergy status: Secondary | ICD-10-CM

## 2021-02-22 DIAGNOSIS — Z888 Allergy status to other drugs, medicaments and biological substances status: Secondary | ICD-10-CM

## 2021-02-22 DIAGNOSIS — M810 Age-related osteoporosis without current pathological fracture: Secondary | ICD-10-CM | POA: Diagnosis present

## 2021-02-22 DIAGNOSIS — J9691 Respiratory failure, unspecified with hypoxia: Secondary | ICD-10-CM | POA: Diagnosis present

## 2021-02-22 DIAGNOSIS — Z20822 Contact with and (suspected) exposure to covid-19: Secondary | ICD-10-CM | POA: Diagnosis present

## 2021-02-22 DIAGNOSIS — Z880 Allergy status to penicillin: Secondary | ICD-10-CM

## 2021-02-22 DIAGNOSIS — Z79899 Other long term (current) drug therapy: Secondary | ICD-10-CM

## 2021-02-22 DIAGNOSIS — Z8701 Personal history of pneumonia (recurrent): Secondary | ICD-10-CM

## 2021-02-22 DIAGNOSIS — Z7983 Long term (current) use of bisphosphonates: Secondary | ICD-10-CM

## 2021-02-22 DIAGNOSIS — Z7901 Long term (current) use of anticoagulants: Secondary | ICD-10-CM

## 2021-02-22 DIAGNOSIS — Z91013 Allergy to seafood: Secondary | ICD-10-CM

## 2021-02-22 DIAGNOSIS — Z8261 Family history of arthritis: Secondary | ICD-10-CM

## 2021-02-22 DIAGNOSIS — I5032 Chronic diastolic (congestive) heart failure: Secondary | ICD-10-CM | POA: Diagnosis present

## 2021-02-22 DIAGNOSIS — G4730 Sleep apnea, unspecified: Secondary | ICD-10-CM | POA: Diagnosis present

## 2021-02-22 DIAGNOSIS — Z981 Arthrodesis status: Secondary | ICD-10-CM

## 2021-02-22 DIAGNOSIS — R4182 Altered mental status, unspecified: Secondary | ICD-10-CM

## 2021-02-22 DIAGNOSIS — I4819 Other persistent atrial fibrillation: Secondary | ICD-10-CM | POA: Diagnosis present

## 2021-02-22 DIAGNOSIS — Z8249 Family history of ischemic heart disease and other diseases of the circulatory system: Secondary | ICD-10-CM

## 2021-02-22 DIAGNOSIS — M170 Bilateral primary osteoarthritis of knee: Secondary | ICD-10-CM | POA: Diagnosis present

## 2021-02-22 DIAGNOSIS — E669 Obesity, unspecified: Secondary | ICD-10-CM | POA: Diagnosis present

## 2021-02-22 DIAGNOSIS — M19039 Primary osteoarthritis, unspecified wrist: Secondary | ICD-10-CM | POA: Diagnosis present

## 2021-02-22 DIAGNOSIS — G9340 Encephalopathy, unspecified: Secondary | ICD-10-CM | POA: Diagnosis present

## 2021-02-22 DIAGNOSIS — N390 Urinary tract infection, site not specified: Principal | ICD-10-CM | POA: Diagnosis present

## 2021-02-22 DIAGNOSIS — I110 Hypertensive heart disease with heart failure: Secondary | ICD-10-CM | POA: Diagnosis present

## 2021-02-22 DIAGNOSIS — E785 Hyperlipidemia, unspecified: Secondary | ICD-10-CM | POA: Diagnosis present

## 2021-02-22 DIAGNOSIS — Z85828 Personal history of other malignant neoplasm of skin: Secondary | ICD-10-CM

## 2021-02-22 DIAGNOSIS — B9689 Other specified bacterial agents as the cause of diseases classified elsewhere: Secondary | ICD-10-CM | POA: Diagnosis present
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
## 2021-02-23 ENCOUNTER — Emergency Department: Payer: Medicare Other

## 2021-02-23 ENCOUNTER — Encounter: Payer: Self-pay | Admitting: Family Medicine

## 2021-02-23 DIAGNOSIS — J45909 Unspecified asthma, uncomplicated: Secondary | ICD-10-CM | POA: Diagnosis present

## 2021-02-23 DIAGNOSIS — Z981 Arthrodesis status: Secondary | ICD-10-CM | POA: Diagnosis not present

## 2021-02-23 DIAGNOSIS — I4819 Other persistent atrial fibrillation: Secondary | ICD-10-CM | POA: Diagnosis present

## 2021-02-23 DIAGNOSIS — I110 Hypertensive heart disease with heart failure: Secondary | ICD-10-CM | POA: Diagnosis present

## 2021-02-23 DIAGNOSIS — Z20822 Contact with and (suspected) exposure to covid-19: Secondary | ICD-10-CM | POA: Diagnosis present

## 2021-02-23 DIAGNOSIS — I5032 Chronic diastolic (congestive) heart failure: Secondary | ICD-10-CM

## 2021-02-23 DIAGNOSIS — J9691 Respiratory failure, unspecified with hypoxia: Secondary | ICD-10-CM | POA: Diagnosis present

## 2021-02-23 DIAGNOSIS — R4182 Altered mental status, unspecified: Secondary | ICD-10-CM | POA: Diagnosis present

## 2021-02-23 DIAGNOSIS — N390 Urinary tract infection, site not specified: Secondary | ICD-10-CM | POA: Diagnosis present

## 2021-02-23 DIAGNOSIS — G4730 Sleep apnea, unspecified: Secondary | ICD-10-CM | POA: Diagnosis present

## 2021-02-23 DIAGNOSIS — Z7983 Long term (current) use of bisphosphonates: Secondary | ICD-10-CM | POA: Diagnosis not present

## 2021-02-23 DIAGNOSIS — K219 Gastro-esophageal reflux disease without esophagitis: Secondary | ICD-10-CM | POA: Diagnosis present

## 2021-02-23 DIAGNOSIS — Z6841 Body Mass Index (BMI) 40.0 and over, adult: Secondary | ICD-10-CM | POA: Diagnosis not present

## 2021-02-23 DIAGNOSIS — Z880 Allergy status to penicillin: Secondary | ICD-10-CM | POA: Diagnosis not present

## 2021-02-23 DIAGNOSIS — Z85828 Personal history of other malignant neoplasm of skin: Secondary | ICD-10-CM | POA: Diagnosis not present

## 2021-02-23 DIAGNOSIS — G9340 Encephalopathy, unspecified: Secondary | ICD-10-CM | POA: Diagnosis present

## 2021-02-23 DIAGNOSIS — Z7901 Long term (current) use of anticoagulants: Secondary | ICD-10-CM | POA: Diagnosis not present

## 2021-02-23 DIAGNOSIS — E785 Hyperlipidemia, unspecified: Secondary | ICD-10-CM | POA: Diagnosis present

## 2021-02-23 DIAGNOSIS — M810 Age-related osteoporosis without current pathological fracture: Secondary | ICD-10-CM | POA: Diagnosis present

## 2021-02-23 DIAGNOSIS — B9689 Other specified bacterial agents as the cause of diseases classified elsewhere: Secondary | ICD-10-CM | POA: Diagnosis present

## 2021-02-23 DIAGNOSIS — Z8701 Personal history of pneumonia (recurrent): Secondary | ICD-10-CM | POA: Diagnosis not present

## 2021-02-23 DIAGNOSIS — E669 Obesity, unspecified: Secondary | ICD-10-CM | POA: Diagnosis present

## 2021-02-23 DIAGNOSIS — Z79899 Other long term (current) drug therapy: Secondary | ICD-10-CM | POA: Diagnosis not present

## 2021-02-23 DIAGNOSIS — M19039 Primary osteoarthritis, unspecified wrist: Secondary | ICD-10-CM | POA: Diagnosis present

## 2021-02-23 DIAGNOSIS — M170 Bilateral primary osteoarthritis of knee: Secondary | ICD-10-CM | POA: Diagnosis present

## 2021-02-23 LAB — COMPREHENSIVE METABOLIC PANEL
ALT: 48 U/L — ABNORMAL HIGH (ref 0–44)
AST: 38 U/L (ref 15–41)
Albumin: 3.7 g/dL (ref 3.5–5.0)
Alkaline Phosphatase: 128 U/L — ABNORMAL HIGH (ref 38–126)
Anion gap: 11 (ref 5–15)
BUN: 15 mg/dL (ref 8–23)
CO2: 25 mmol/L (ref 22–32)
Calcium: 10.1 mg/dL (ref 8.9–10.3)
Chloride: 103 mmol/L (ref 98–111)
Creatinine, Ser: 0.93 mg/dL (ref 0.44–1.00)
GFR, Estimated: >60 mL/min (ref >60)
Glucose, Bld: 120 mg/dL — ABNORMAL HIGH (ref 70–99)
Potassium: 5 mmol/L (ref 3.5–5.1)
Sodium: 139 mmol/L (ref 135–145)
Total Bilirubin: 1.5 mg/dL — ABNORMAL HIGH (ref 0.3–1.2)
Total Protein: 7.8 g/dL (ref 6.5–8.1)

## 2021-02-23 LAB — TROPONIN I (HIGH SENSITIVITY): Troponin I (High Sensitivity): 7 ng/L (ref <18)

## 2021-02-23 LAB — BRAIN NATRIURETIC PEPTIDE: B Natriuretic Peptide: 52.8 pg/mL (ref 0.0–100.0)

## 2021-02-23 LAB — RESP PANEL BY RT-PCR (FLU A&B, COVID) ARPGX2
Influenza A by PCR: NEGATIVE
Influenza B by PCR: NEGATIVE
SARS Coronavirus 2 by RT PCR: NEGATIVE

## 2021-02-23 LAB — URINALYSIS, COMPLETE (UACMP) WITH MICROSCOPIC
Glucose, UA: NEGATIVE mg/dL
Nitrite: NEGATIVE
RBC / HPF: >50 RBC/hpf — ABNORMAL HIGH (ref 0–5)
Specific Gravity, Urine: >1.03 — ABNORMAL HIGH (ref 1.005–1.030)
pH: 5.5 (ref 5.0–8.0)

## 2021-02-23 LAB — CBC WITH DIFFERENTIAL/PLATELET
Abs Immature Granulocytes: 0.06 10*3/uL (ref 0.00–0.07)
Basophils Absolute: 0 10*3/uL (ref 0.0–0.1)
Basophils Relative: 0 %
Eosinophils Absolute: 0.1 10*3/uL (ref 0.0–0.5)
Eosinophils Relative: 1 %
HCT: 44.5 % (ref 36.0–46.0)
Hemoglobin: 14.5 g/dL (ref 12.0–15.0)
Immature Granulocytes: 1 %
Lymphocytes Relative: 14 %
Lymphs Abs: 1.7 10*3/uL (ref 0.7–4.0)
MCH: 33 pg (ref 26.0–34.0)
MCHC: 32.6 g/dL (ref 30.0–36.0)
MCV: 101.4 fL — ABNORMAL HIGH (ref 80.0–100.0)
Monocytes Absolute: 1.3 10*3/uL — ABNORMAL HIGH (ref 0.1–1.0)
Monocytes Relative: 11 %
Neutro Abs: 8.8 10*3/uL — ABNORMAL HIGH (ref 1.7–7.7)
Neutrophils Relative %: 73 %
Platelets: 347 10*3/uL (ref 150–400)
RBC: 4.39 MIL/uL (ref 3.87–5.11)
RDW: 14.4 % (ref 11.5–15.5)
WBC: 12 10*3/uL — ABNORMAL HIGH (ref 4.0–10.5)
nRBC: 0 % (ref 0.0–0.2)

## 2021-02-23 LAB — VITAMIN B12: Vitamin B-12: 5645 pg/mL — ABNORMAL HIGH (ref 180–914)

## 2021-02-23 LAB — TSH: TSH: 2.789 u[IU]/mL (ref 0.350–4.500)

## 2021-02-23 LAB — AMMONIA: Ammonia: 13 umol/L (ref 9–35)

## 2021-02-23 LAB — URINE DRUG SCREEN, QUALITATIVE (ARMC ONLY)
Amphetamines, Ur Screen: NOT DETECTED
Barbiturates, Ur Screen: NOT DETECTED
Benzodiazepine, Ur Scrn: NOT DETECTED
Cannabinoid 50 Ng, Ur ~~LOC~~: NOT DETECTED
Cocaine Metabolite,Ur ~~LOC~~: NOT DETECTED
MDMA (Ecstasy)Ur Screen: NOT DETECTED
Methadone Scn, Ur: NOT DETECTED
Opiate, Ur Screen: NOT DETECTED
Phencyclidine (PCP) Ur S: NOT DETECTED
Tricyclic, Ur Screen: NOT DETECTED

## 2021-02-23 LAB — D-DIMER, QUANTITATIVE: D-Dimer, Quant: 5.95 ug/mL-FEU — ABNORMAL HIGH (ref 0.00–0.50)

## 2021-02-23 LAB — ETHANOL: Alcohol, Ethyl (B): <10 mg/dL (ref <10)

## 2021-02-23 LAB — LACTIC ACID, PLASMA: Lactic Acid, Venous: 1.1 mmol/L (ref 0.5–1.9)

## 2021-02-23 LAB — PROTIME-INR
INR: 1.6 — ABNORMAL HIGH (ref 0.8–1.2)
Prothrombin Time: 19 seconds — ABNORMAL HIGH (ref 11.4–15.2)

## 2021-02-23 LAB — GLUCOSE, CAPILLARY: Glucose-Capillary: 108 mg/dL — ABNORMAL HIGH (ref 70–99)

## 2021-02-23 MED ORDER — SODIUM CHLORIDE 0.9 % IV SOLN: 250.0000 mL | INTRAVENOUS | Status: DC | PRN

## 2021-02-23 MED ORDER — SODIUM CHLORIDE 0.9 % IV SOLN
2.0000 g | INTRAVENOUS | Status: DC
  Administered 2021-02-23 – 2021-02-24 (×2): 2 g via INTRAVENOUS
  Filled 2021-02-23: qty 2
  Filled 2021-02-23 (×2): qty 20

## 2021-02-23 MED ORDER — ONDANSETRON HCL 4 MG PO TABS: 4.0000 mg | ORAL_TABLET | Freq: Four times a day (QID) | ORAL | Status: DC | PRN

## 2021-02-23 MED ORDER — SODIUM CHLORIDE 0.9 % IV BOLUS
500.0000 mL | Freq: Once | INTRAVENOUS | Status: AC
  Administered 2021-02-23: 500 mL via INTRAVENOUS

## 2021-02-23 MED ORDER — SENNOSIDES-DOCUSATE SODIUM 8.6-50 MG PO TABS: 1.0000 | ORAL_TABLET | Freq: Every evening | ORAL | Status: DC | PRN

## 2021-02-23 MED ORDER — ONDANSETRON HCL 4 MG/2ML IJ SOLN: 4.0000 mg | Freq: Four times a day (QID) | INTRAMUSCULAR | Status: DC | PRN

## 2021-02-23 MED ORDER — ALBUTEROL SULFATE (2.5 MG/3ML) 0.083% IN NEBU: 2.5000 mg | INHALATION_SOLUTION | RESPIRATORY_TRACT | Status: DC | PRN

## 2021-02-23 MED ORDER — CHLORHEXIDINE GLUCONATE 0.12 % MT SOLN
15.0000 mL | Freq: Two times a day (BID) | OROMUCOSAL | Status: DC
  Administered 2021-02-23 – 2021-02-25 (×6): 15 mL via OROMUCOSAL
  Filled 2021-02-23 (×6): qty 15

## 2021-02-23 MED ORDER — ACETAMINOPHEN 650 MG RE SUPP: 650.0000 mg | Freq: Four times a day (QID) | RECTAL | Status: DC | PRN

## 2021-02-23 MED ORDER — ACETAMINOPHEN 325 MG PO TABS: 650.0000 mg | ORAL_TABLET | Freq: Four times a day (QID) | ORAL | Status: DC | PRN

## 2021-02-23 NOTE — Progress Notes (Signed)
Patient arrived to unit confused and having tremors, nurse asked patient does she typically have tremors and patient stated "no". Patient on [place] 3 liters of O2 per notes patient uses intermittently at facility. When provider came to assess patient patient had become diaphoretic. MD is working on obtaining more information on patient from other sources as she is a poor historian and intermittently confused.

## 2021-02-23 NOTE — Progress Notes (Signed)
Nurse attempted to call [facility] assisted living where pt. Lives to obtain more information on patient to complete admission, this attempt was unsuccessful no one answered phone. Nurse will notify on-coming nurse to attempt to call again.

## 2021-02-23 NOTE — Plan of Care (Signed)
  Problem: Education: Goal: Knowledge of General Education information will improve Description: Including pain rating scale, medication(s)/side effects and non-pharmacologic comfort measures Outcome: Not Progressing   Problem: Health Behavior/Discharge Planning: Goal: Ability to manage health-related needs will improve Outcome: Not Progressing   Problem: Activity: Goal: Risk for activity intolerance will decrease Outcome: Not Progressing   Problem: Coping: Goal: Level of anxiety will decrease Outcome: Progressing   Problem: Elimination: Goal: Will not experience complications related to bowel motility Outcome: Progressing Goal: Will not experience complications related to urinary retention Outcome: Progressing   Problem: Pain Managment: Goal: General experience of comfort will improve Outcome: Progressing   Problem: Safety: Goal: Ability to remain free from injury will improve Outcome: Progressing   Problem: Skin Integrity: Goal: Risk for impaired skin integrity will decrease Outcome: Progressing

## 2021-02-23 NOTE — Progress Notes (Signed)
Dr. Billie Ruddy made aware that patient has shown no improvement in mentation. She will still not answer any questions, she only nods her head. She will not follow commands and is keeping her eyes shut tight. Dr. Billie Ruddy has ordered to start antibiotics.

## 2021-02-23 NOTE — Progress Notes (Signed)
Dr. Billie Ruddy made aware that the patient has now stopped answering my questions and will only nod her head when I ask her questions. She does not follow commands properly and is keeping her eyes shut very tight. I also made her aware that the patient is not eating or drinking anything, she will order IV bolus of normal saline.
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
## 2021-02-23 NOTE — TOC Initial Note (Addendum)
Transition of Care [facility]) - Initial/Assessment Note    Patient Details  Name: Laura Gonzalez MRN: [id number] Date of Birth: 18-Dec-1940  Transition [facility]) CM/SW Contact:    Magnus Ivan, LCSW Phone Number: 02/23/2021, 10:38 AM  Clinical Narrative:         Per chart review, patient is disoriented. Called friend Octavia Bruckner. Tim reported he is patient's POA. Patient does not have family locally. He reported patient had been living at [facility] for about 3 years. Prior to this hospitalization, she was at their SNF for rehab. He expects patient will need to return for more rehab at discharge. He stated at baseline, patient does not have any confusion. He is unsure if patient uses any DME at baseline. Patient had [facility] in the past. CSW left VM for [facility] at [place] requesting a return call to [facility] tomorrow. TOC will continue to follow. Patient will need a PT eval when appropriate.           Expected Discharge Plan: Skilled Nursing Facility Barriers to Discharge: Continued Medical Work up   Patient Goals and CMS Choice Patient states their goals for this hospitalization and ongoing recovery are:: to return to rehab CMS Medicare.gov Compare Post Acute Care list provided to:: Patient Represenative (must comment) Choice offered to / list presented to : [place] / Guardian  Expected Discharge Plan and Services Expected Discharge Plan: [place] arrangements for the past 2 months: [place]                                      Prior Living Arrangements/Services Living arrangements for the past 2 months: [place] Lives with:: Facility Resident Patient language and need for interpreter reviewed:: Yes Do you feel safe going back to the place where you live?: Yes      Need for Family Participation in Patient Care: Yes (Comment) Care giver support system in place?: Yes (comment)    Criminal Activity/Legal Involvement Pertinent to Current Situation/Hospitalization: No - Comment as needed  Activities of Daily Living Home Assistive Devices/Equipment: Walker (specify type) ADL Screening (condition at time of admission) Patient's cognitive ability adequate to safely complete daily activities?: No Is the patient deaf or have difficulty hearing?: No Does the patient have difficulty seeing, even when wearing glasses/contacts?: Yes Does the patient have difficulty concentrating, remembering, or making decisions?: Yes Patient able to express need for assistance with ADLs?: No Does the patient have difficulty dressing or bathing?: Yes Independently performs ADLs?: No Communication: Independent Dressing (OT): Needs assistance Is this a change from baseline?: Pre-admission baseline Grooming: Needs assistance Is this a change from baseline?: Pre-admission baseline Feeding: Needs assistance Is this a change from baseline?: Change from baseline, expected to last <3 days Bathing: Needs assistance Is this a change from baseline?: Pre-admission baseline Toileting: Needs assistance Is this a change from baseline?: Pre-admission baseline In/Out Bed: Needs assistance Is this a change from baseline?: Change from baseline, expected to last <3 days Walks in Home: Needs assistance Is this a change from baseline?: Change from baseline, expected to last <3 days Does the patient have difficulty walking or climbing stairs?: Yes Weakness of Legs: Both Weakness of Arms/Hands: Both  Permission Sought/Granted Permission sought to share information with : Facility Art therapist granted to share information with : Yes, Verbal  Permission Granted (by friend/POA)     Permission granted to share info w AGENCY: [place]        Emotional Assessment       Orientation: : Fluctuating Orientation (Suspected and/or reported Sundowners) Alcohol /  Substance Use: Not Applicable Psych Involvement: No (comment)  Admission diagnosis:  Acute encephalopathy [G93.40] Altered mental status, unspecified altered mental status type [R41.82] Patient Active Problem List   Diagnosis Date Noted   Acute encephalopathy 02/23/2021   Acute on chronic diastolic CHF (congestive heart failure) ([place]) 02/25/2020   Obesity, Class III, BMI 40-49.9 (morbid obesity) ([place]) 02/25/2020   Supratherapeutic INR 02/25/2020   Asthma exacerbation 02/18/2020   Acute respiratory failure with hypoxia ([place]) 07/06/2019   Chest pain 12/29/2018   Chronic diastolic heart failure ([place]) 04/22/2018   Atrial fibrillation with RVR ([place]) 04/06/2018   PAD (peripheral artery disease) ([place]) 09/22/2017   AV malformation, 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   Hemarthrosis involving knee joint, right 08/17/2017   Varicose veins of bilateral lower extremities with other complications 0000000   Chronic venous insufficiency 08/17/2017   Right knee pain 07/25/2017   Gait instability 07/23/2017   Persistent atrial fibrillation ([place]) 12/11/2014   HTN (hypertension) 12/11/2014   GERD (gastroesophageal reflux disease) 12/11/2014   Asthma 12/11/2014   PCP:  Baxter Hire, MD Pharmacy:   [id number] [place], [place] - [place] AT [place] [address] [place] [address] Phone: [phone] Fax: [place] [id number] [place], [place] - [place] AT [facility] OF [place] [address] Phone: [phone] Fax: [phone]  [facility] [place], [place] - [place] [address] Phone: [phone] Fax: [phone]     Social Determinants of Health (SDOH) Interventions    Readmission Risk Interventions No flowsheet data found.
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
## 2021-02-24 ENCOUNTER — Other Ambulatory Visit: Payer: Self-pay

## 2021-02-24 LAB — COMPREHENSIVE METABOLIC PANEL
ALT: 52 U/L — ABNORMAL HIGH (ref 0–44)
AST: 41 U/L (ref 15–41)
Albumin: 3.5 g/dL (ref 3.5–5.0)
Alkaline Phosphatase: 127 U/L — ABNORMAL HIGH (ref 38–126)
Anion gap: 10 (ref 5–15)
BUN: 17 mg/dL (ref 8–23)
CO2: 25 mmol/L (ref 22–32)
Calcium: 8.9 mg/dL (ref 8.9–10.3)
Chloride: 108 mmol/L (ref 98–111)
Creatinine, Ser: 0.86 mg/dL (ref 0.44–1.00)
GFR, Estimated: >60 mL/min (ref >60)
Glucose, Bld: 84 mg/dL (ref 70–99)
Potassium: 4.1 mmol/L (ref 3.5–5.1)
Sodium: 143 mmol/L (ref 135–145)
Total Bilirubin: 1.4 mg/dL — ABNORMAL HIGH (ref 0.3–1.2)
Total Protein: 6.9 g/dL (ref 6.5–8.1)

## 2021-02-24 LAB — CBC
HCT: 43.1 % (ref 36.0–46.0)
Hemoglobin: 13.8 g/dL (ref 12.0–15.0)
MCH: 33.1 pg (ref 26.0–34.0)
MCHC: 32 g/dL (ref 30.0–36.0)
MCV: 103.4 fL — ABNORMAL HIGH (ref 80.0–100.0)
Platelets: 309 10*3/uL (ref 150–400)
RBC: 4.17 MIL/uL (ref 3.87–5.11)
RDW: 14.5 % (ref 11.5–15.5)
WBC: 9.6 10*3/uL (ref 4.0–10.5)
nRBC: 0 % (ref 0.0–0.2)

## 2021-02-24 LAB — RPR
RPR Ser Ql: REACTIVE — AB
RPR Titer: 1:2 {titer}

## 2021-02-24 LAB — MAGNESIUM: Magnesium: 2.6 mg/dL — ABNORMAL HIGH (ref 1.7–2.4)

## 2021-02-24 MED ORDER — ENSURE ENLIVE PO LIQD
237.0000 mL | Freq: Two times a day (BID) | ORAL | Status: DC
  Administered 2021-02-24 – 2021-02-26 (×4): 237 mL via ORAL

## 2021-02-24 MED ORDER — DILTIAZEM HCL ER COATED BEADS 300 MG PO CP24
300.0000 mg | ORAL_CAPSULE | Freq: Every day | ORAL | Status: DC
  Administered 2021-02-24 – 2021-02-26 (×3): 300 mg via ORAL
  Filled 2021-02-24 (×3): qty 1

## 2021-02-24 MED ORDER — APIXABAN 5 MG PO TABS
5.0000 mg | ORAL_TABLET | Freq: Two times a day (BID) | ORAL | Status: DC
  Administered 2021-02-24 – 2021-02-26 (×4): 5 mg via ORAL
  Filled 2021-02-24 (×4): qty 1

## 2021-02-24 MED ORDER — MONTELUKAST SODIUM 10 MG PO TABS
10.0000 mg | ORAL_TABLET | Freq: Every day | ORAL | Status: DC
  Administered 2021-02-24 – 2021-02-25 (×2): 10 mg via ORAL
  Filled 2021-02-24 (×2): qty 1

## 2021-02-24 MED ORDER — AMIODARONE HCL 200 MG PO TABS
200.0000 mg | ORAL_TABLET | Freq: Every day | ORAL | Status: DC
  Administered 2021-02-24 – 2021-02-26 (×3): 200 mg via ORAL
  Filled 2021-02-24 (×3): qty 1

## 2021-02-24 MED ORDER — METOPROLOL TARTRATE 50 MG PO TABS
50.0000 mg | ORAL_TABLET | Freq: Two times a day (BID) | ORAL | Status: DC
  Administered 2021-02-24 – 2021-02-26 (×4): 50 mg via ORAL
  Filled 2021-02-24 (×4): qty 1

## 2021-02-24 NOTE — Plan of Care (Signed)
At hs pt was following commands but would not open eyes. When asked to open eyes pt shook her head no. Pt would squeeze bilateral grips and move toes on command. This am pt is alert opening eyes and calling out for assistance. Vitals have remained stable during overnight.  Problem: Education: Goal: Knowledge of General Education information will improve Description: Including pain rating scale, medication(s)/side effects and non-pharmacologic comfort measures Outcome: Progressing   Problem: Health Behavior/Discharge Planning: Goal: Ability to manage health-related needs will improve Outcome: Progressing   Problem: Clinical Measurements: Goal: Ability to maintain clinical measurements within normal limits will improve Outcome: Progressing Goal: Will remain free from infection Outcome: Progressing Goal: Diagnostic test results will improve Outcome: Progressing Goal: Respiratory complications will improve Outcome: Progressing Goal: Cardiovascular complication will be avoided Outcome: Progressing   Problem: Activity: Goal: Risk for activity intolerance will decrease Outcome: Progressing   Problem: Nutrition: Goal: Adequate nutrition will be maintained Outcome: Progressing   Problem: Coping: Goal: Level of anxiety will decrease Outcome: Progressing   Problem: Elimination: Goal: Will not experience complications related to bowel motility Outcome: Progressing Goal: Will not experience complications related to urinary retention Outcome: Progressing   Problem: Pain Managment: Goal: General experience of comfort will improve Outcome: Progressing   Problem: Safety: Goal: Ability to remain free from injury will improve Outcome: Progressing   Problem: Skin Integrity: Goal: Risk for impaired skin integrity will decrease Outcome: Progressing

## 2021-02-24 NOTE — Progress Notes (Signed)
Initial Nutrition Assessment  DOCUMENTATION CODES:  Obesity unspecified  INTERVENTION:  Liberalize diet to regular for poor intake to maximize food options Ensure Enlive po BID, each supplement provides 350 kcal and 20 grams of protein Snacks TID Magic cup TID with meals, each supplement provides 290 kcal and 9 grams of protein  NUTRITION DIAGNOSIS:  Inadequate oral intake related to poor appetite as evidenced by meal completion < 25%, per patient/family report.  GOAL:  Patient will meet greater than or equal to 90% of their needs  MONITOR:  PO intake, Supplement acceptance, Labs  REASON FOR ASSESSMENT:  Malnutrition Screening Tool    ASSESSMENT:  80 y.o. female with PMH of atrial fibrillation, CHF, diverticulosis, GERD, HLD, and HTN who presented to ED from her nursing facility with AMS and hallucinations noted this evening. Several recent ED visits for falls over the past few weeks.  Noted that pt was quite disoriented and unresponsive this AM. Improved by the time of assessment. Pt resting in bed at the time of visit. Breakfast tray noted at bedside untouched. Lunch tray just delivered and pt reports she does not plan to eat it. Denies GI distress, but states nothing sounds appealing to her. Pt reports she has not eaten well in quite some time. Also expresses concern over being able to swallow - no mention of this in chart, unsure of reliability of hx. Mentioned pt's concerns to RN.    Pt reports that she drinks ensure at baseline 3x/d at facility. Also named several foods she likes (cottage cheese, yogurt, ice cream). Will add snacks and supplements to support adequate intake.   Average Meal Intake: 9/4-9/5: 0% intake x 2 recorded meals  Nutritionally Relevant Medications: Continuous Infusions:  sodium chloride     cefTRIAXone (ROCEPHIN)  IV 2 g (02/23/21 1742)   PRN Meds: ondansetron, senna-docusate  Labs Reviewed: Mg 2.6 Alkaline phosphatase 127  NUTRITION - FOCUSED  PHYSICAL EXAM: No deficits noted on exam, however adipose tissue and edema could be masking signs of depletion. Flowsheet Row Most Recent Value  Orbital Region No depletion  Upper Arm Region No depletion  Thoracic and Lumbar Region No depletion  Buccal Region No depletion  Temple Region No depletion  Clavicle Bone Region No depletion  Clavicle and Acromion Bone Region No depletion  Scapular Bone Region No depletion  Dorsal Hand No depletion  Patellar Region No depletion  Anterior Thigh Region No depletion  Posterior Calf Region No depletion  Edema (RD Assessment) Moderate  [legs]  Hair Reviewed  Eyes Reviewed  Mouth Reviewed  Skin Reviewed  Nails Reviewed   Diet Order:   Diet Order             Diet regular Room service appropriate? Yes with Assist; Fluid consistency: Thin  Diet effective now                  EDUCATION NEEDS:  No education needs have been identified at this time  Skin:  Skin Assessment: Reviewed RN Assessment (ecchymosis and MASD)  Last BM:  unknown  Height:  Ht Readings from Last 1 Encounters:  02/22/21 5' 7.5" (1.715 m)   Weight:  Wt Readings from Last 1 Encounters:  02/23/21 125.6 kg    Ideal Body Weight:  63.6 kg  BMI:  Body mass index is 42.73 kg/m.  Estimated Nutritional Needs:  Kcal:  1700-1900 kcal/d Protein:  85-95g/d Fluid:  1.8-2L/d  Ranell Patrick, RD, LDN Clinical Dietitian Pager on [facility]

## 2021-02-24 NOTE — Progress Notes (Signed)
PROGRESS NOTE    Laura Gonzalez  [id number] DOB: 1940/07/06 DOA: 02/22/2021 PCP: Baxter Hire, MD  [phone]   Assessment & Plan:   Principal Problem:   Acute encephalopathy Active Problems:   Persistent atrial fibrillation (HCC)   Asthma   Chronic diastolic heart failure ([place])   AMS (altered mental status)   Laura Gonzalez is a 80 y.o. female with medical history significant for atrial fibrillation on anticoagulation, BMI 43, chronic diastolic CHF, and asthma, now presenting to the emergency department from her SNF where she has reportedly been confused and hallucinating.    1. Acute encephalopathy, resolved --likely due to UTI, since encephalopathy resolved only after starting abx, and workup thus far showed nothing else acute. Plan: --treat UTI  UTI --started on ceftriaxone on 9/4 and mental status improved today Plan: --cont ceftriaxone pending urine cx  2. paroxsymal Atrial fibrillation on Eliquis - In SR on admission  --resume home eliquis --resume home amiodarone, cardizem and Lopressor   3. Chronic diastolic CHF  - Appears compensated  - Monitor volume status, follow-up medication reconciliation     4. Asthma  - No cough or wheeze on admission  - Albuterol as needed    5. Hypoxic respiratory failure  - She is on 3 Lpm supplemental O2 on admission with saturations mid-90s  - Unclear acuity  - No acute CXR findings, no rales or wheezes on exam   --Continue supplemental O2 to keep sats >=92%, wean as tolerated  Reduced appetite --nutrition consult --Liberalize diet to regular  --Ensure BID   DVT prophylaxis: [id number] Code Status: Full code  Family Communication:  Level of care: Med-Surg Dispo:   The patient is from: independent living facility Anticipated d/c is to: to be determined Anticipated d/c date is: 2-3 days Patient currently is not medically ready to d/c due to: IV abx for UTI, pending urine cx   Subjective and Interval  History:  Mental status much improved today.  Pt said she was confused and scared yesterday because she didn't know where she was.  Denied dysuria, abdominal pain.  Did not remember her mental status change prior to presentation.  Admitted to reduced appetite and reduced oral intake prior to presentation.  Pt said she will drink Ensure.   Objective: Vitals:   02/23/21 2129 02/24/21 0332 02/24/21 0731 02/24/21 1510  BP: (!) 147/67 (!) 147/74 (!) 171/76 (!) 147/66  Pulse: 72 87 86 88  Resp: '16 20 18 18  '$ Temp: 99.2 F (37.3 C) 99.1 F (37.3 C) 97.6 F (36.4 C)   TempSrc: Axillary Oral Oral   SpO2: 97% 96% 95% 97%  Weight:      Height:        Intake/Output Summary (Last 24 hours) at 02/24/2021 1714 Last data filed at 02/24/2021 1418 Gross per 24 hour  Intake 340.06 ml  Output 150 ml  Net 190.06 ml   Filed Weights   02/22/21 2353 02/23/21 0513  Weight: 130.7 kg 125.6 kg    Examination:   Constitutional: NAD, AAOx3, coherent HEENT: conjunctivae and lids normal, EOMI CV: No cyanosis.   RESP: normal respiratory effort, on RA Extremities: chronic venous stasis changes in BLE SKIN: warm, dry Neuro: II - XII grossly intact.   Psych: Normal mood and affect.  Appropriate judgement and reason   Data Reviewed: I have personally reviewed following labs and imaging studies  CBC: Recent Labs  Lab 02/22/21 2357 02/24/21 0415  WBC 12.0* 9.6  NEUTROABS 8.8*  --  HGB 14.5 13.8  HCT 44.5 43.1  MCV 101.4* 103.4*  PLT 347 [id number]   Basic Metabolic Panel: Recent Labs  Lab 02/22/21 2357 02/24/21 0415  NA 139 143  K 5.0 4.1  CL 103 108  CO2 25 25  GLUCOSE 120* 84  BUN 15 17  CREATININE 0.93 0.86  CALCIUM 10.1 8.9  MG  --  2.6*   GFR: Estimated Creatinine Clearance: 73.6 mL/min (by C-G formula based on SCr of 0.86 mg/dL). Liver Function Tests: Recent Labs  Lab 02/22/21 2357 02/24/21 0415  AST 38 41  ALT 48* 52*  ALKPHOS 128* 127*  BILITOT 1.5* 1.4*  PROT 7.8 6.9   ALBUMIN 3.7 3.5   No results for input(s): LIPASE, AMYLASE in the last 168 hours. Recent Labs  Lab 02/23/21 0651  AMMONIA 13   Coagulation Profile: Recent Labs  Lab 02/23/21 0651  INR 1.6*   Cardiac Enzymes: No results for input(s): CKTOTAL, CKMB, CKMBINDEX, TROPONINI in the last 168 hours. BNP (last 3 results) No results for input(s): PROBNP in the last 8760 hours. HbA1C: No results for input(s): HGBA1C in the last 72 hours. CBG: Recent Labs  Lab 02/23/21 1210  GLUCAP 108*   Lipid Profile: No results for input(s): CHOL, HDL, LDLCALC, TRIG, CHOLHDL, LDLDIRECT in the last 72 hours. Thyroid Function Tests: Recent Labs    02/23/21 0651  TSH 2.789   Anemia Panel: Recent Labs    02/23/21 0651  VITAMINB12 5,645*   Sepsis Labs: Recent Labs  Lab 02/22/21 2357  LATICACIDVEN 1.1    Recent Results (from the past 240 hour(s))  Resp Panel by RT-PCR (Flu A&B, Covid) Nasopharyngeal Swab     Status: None   Collection Time: 02/22/21 11:57 PM   Specimen: Nasopharyngeal Swab; Nasopharyngeal(NP) swabs in vial transport medium  Result Value Ref Range Status   SARS Coronavirus 2 by RT PCR NEGATIVE NEGATIVE Final    Comment: (NOTE) SARS-CoV-2 target nucleic acids are NOT DETECTED.  The SARS-CoV-2 RNA is generally detectable in upper respiratory specimens during the acute phase of infection. The lowest concentration of SARS-CoV-2 viral copies this assay can detect is 138 copies/mL. A negative result does not preclude SARS-Cov-2 infection and should not be used as the sole basis for treatment or other patient management decisions. A negative result may occur with  improper specimen collection/handling, submission of specimen other than nasopharyngeal swab, presence of viral mutation(s) within the areas targeted by this assay, and inadequate number of viral copies(<138 copies/mL). A negative result must be combined with clinical observations, patient history, and  epidemiological information. The expected result is Negative.  Fact Sheet for Patients:  EntrepreneurPulse.com.au  Fact Sheet for Healthcare Providers:  IncredibleEmployment.be  This test is no t yet approved or cleared by the [place] FDA and  has been authorized for detection and/or diagnosis of SARS-CoV-2 by FDA under an Emergency Use Authorization (EUA). This EUA will remain  in effect (meaning this test can be used) for the duration of the COVID-19 declaration under Section 564(b)(1) of the Act, 21 U.S.C.section 360bbb-3(b)(1), unless the authorization is terminated  or revoked sooner.       Influenza A by PCR NEGATIVE NEGATIVE Final   Influenza B by PCR NEGATIVE NEGATIVE Final    Comment: (NOTE) The Xpert Xpress SARS-CoV-2/FLU/RSV plus assay is intended as an aid in the diagnosis of influenza from Nasopharyngeal swab specimens and should not be used as a sole basis for treatment. Nasal washings and aspirates are unacceptable for  Xpert Xpress SARS-CoV-2/FLU/RSV testing.  Fact Sheet for Patients: EntrepreneurPulse.com.au  Fact Sheet for Healthcare Providers: IncredibleEmployment.be  This test is not yet approved or cleared by the [place] FDA and has been authorized for detection and/or diagnosis of SARS-CoV-2 by FDA under an Emergency Use Authorization (EUA). This EUA will remain in effect (meaning this test can be used) for the duration of the COVID-19 declaration under Section 564(b)(1) of the Act, 21 U.S.C. section 360bbb-3(b)(1), unless the authorization is terminated or revoked.  Performed at [facility], [address] [place], [place] [address]   Urine Culture     Status: None (Preliminary result)   Collection Time: 02/23/21  8:39 AM   Specimen: Urine, Random  Result Value Ref Range Status   Specimen Description   Final    URINE, RANDOM Performed at [facility], [address], [place], [place] [address]    Special Requests   Final    NONE Performed at [facility], [address], [place], [place] [address]    Culture   Final    CULTURE REINCUBATED FOR BETTER GROWTH Performed at [facility], [place] [address], [place], [place] [address]    Report Status PENDING  Incomplete      Radiology Studies: CT HEAD WO CONTRAST (5MM)  Result Date: 02/23/2021 CLINICAL DATA:  Mental status change with unknown cause EXAM: CT HEAD WITHOUT CONTRAST TECHNIQUE: Contiguous axial images were obtained from the base of the skull through the vertex without intravenous contrast. COMPARISON:  02/19/2021 FINDINGS: Brain: No evidence of acute infarction, hemorrhage, hydrocephalus, extra-axial collection or mass lesion/mass effect. Vascular: No hyperdense vessel or unexpected calcification. Skull: Negative for fracture or focal lesion. Mild hyperostosis interna. 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 Sinuses/Orbits: Negative IMPRESSION: No acute or interval finding. Electronically Signed   By: Monte Fantasia M.D.   On: 02/23/2021 04:14   DG Chest Portable 1 View  Result Date: 02/23/2021 CLINICAL DATA:  Altered mental status, hypoxia EXAM: PORTABLE CHEST 1 VIEW COMPARISON:  02/19/2021 FINDINGS: Lung volumes are small with left basilar atelectasis noted. No superimposed confluent pulmonary infiltrate. No pneumothorax or pleural effusion. Cardiac size is within normal limits when accounting for poor pulmonary insufflation. Pulmonary vascularity is normal. No acute bone abnormality. IMPRESSION: Pulmonary hypoinflation. Electronically Signed   By: Fidela Salisbury M.D.   On: 02/23/2021 00:34     Scheduled Meds:  chlorhexidine  15 mL Mouth Rinse BID   feeding supplement  237 mL Oral BID BM   Continuous Infusions:  sodium chloride     cefTRIAXone (ROCEPHIN)  IV 2 g (02/24/21 1614)     LOS: 1 day     Enzo Bi,  MD [facility] If 7PM-7AM, please contact night-coverage 02/24/2021, 5:14 PM

## 2021-02-25 LAB — URINE CULTURE: Culture: >=100000 — AB

## 2021-02-25 LAB — SARS CORONAVIRUS 2 (TAT 6-24 HRS): SARS Coronavirus 2: NEGATIVE

## 2021-02-25 LAB — CBC
HCT: 40.6 % (ref 36.0–46.0)
Hemoglobin: 12.9 g/dL (ref 12.0–15.0)
MCH: 32.3 pg (ref 26.0–34.0)
MCHC: 31.8 g/dL (ref 30.0–36.0)
MCV: 101.5 fL — ABNORMAL HIGH (ref 80.0–100.0)
Platelets: 278 10*3/uL (ref 150–400)
RBC: 4 MIL/uL (ref 3.87–5.11)
RDW: 14.4 % (ref 11.5–15.5)
WBC: 10.1 10*3/uL (ref 4.0–10.5)
nRBC: 0 % (ref 0.0–0.2)

## 2021-02-25 LAB — BASIC METABOLIC PANEL
Anion gap: 7 (ref 5–15)
BUN: 15 mg/dL (ref 8–23)
CO2: 28 mmol/L (ref 22–32)
Calcium: 8.9 mg/dL (ref 8.9–10.3)
Chloride: 104 mmol/L (ref 98–111)
Creatinine, Ser: 0.68 mg/dL (ref 0.44–1.00)
GFR, Estimated: >60 mL/min (ref >60)
Glucose, Bld: 114 mg/dL — ABNORMAL HIGH (ref 70–99)
Potassium: 4.5 mmol/L (ref 3.5–5.1)
Sodium: 139 mmol/L (ref 135–145)

## 2021-02-25 LAB — MAGNESIUM: Magnesium: 2.6 mg/dL — ABNORMAL HIGH (ref 1.7–2.4)

## 2021-02-25 MED ORDER — POLYETHYLENE GLYCOL 3350 17 G PO PACK
34.0000 g | PACK | Freq: Two times a day (BID) | ORAL | Status: DC
  Administered 2021-02-25 (×2): 34 g via ORAL
  Filled 2021-02-25 (×3): qty 2

## 2021-02-25 MED ORDER — CEFAZOLIN SODIUM-DEXTROSE 1-4 GM/50ML-% IV SOLN
1.0000 g | Freq: Three times a day (TID) | INTRAVENOUS | Status: DC
  Administered 2021-02-25 – 2021-02-26 (×4): 1 g via INTRAVENOUS
  Filled 2021-02-25 (×7): qty 50

## 2021-02-25 NOTE — Evaluation (Signed)
Occupational Therapy Evaluation Patient Details Name: Laura Gonzalez MRN: [id number] DOB: Sep 09, 1940 Today's Date: 02/25/2021    History of Present Illness Laura Gonzalez is a 80 y.o. female with medical history significant for atrial fibrillation on anticoagulation, BMI 43, chronic diastolic CHF, and asthma, now presenting to the emergency department from her facility where she has reportedly been confused and hallucinating.   Clinical Impression   [name] was seen for OT evaluation this date. Pt lives at [facility] - recently seen in hospital and sent to SNF where pt states she has been unable to stand without assist. Pt presents to acute OT demonstrating impaired ADL performance and functional mobility 2/2 decreased activity tolerance, functional strength/ROM/balance deficits. Pt currently requires MAX A sup<>sit. Tolerates sitting EOB c CGA improving to SBA for >5 min. Pt completed x3 standing trials MAX A + RW to achieve upright posture, unable to weight shift for side steps at EOB. Pt reports 6/10 abdominal pain concerned for constipation - left on bedpan, RN aware.  MOD A for rolling at bed level for bed pan placement, MAX A don B socks seated EOB. MIN A for UBD seated EOB.   Pt would benefit from skilled OT to address noted impairments and functional limitations (see below for any additional details) in order to maximize safety and independence while minimizing falls risk and caregiver burden. Upon hospital discharge, recommend STR to maximize pt safety and return to PLOF.     Follow Up Recommendations  SNF    Equipment Recommendations  Other (comment) (defer to next venue of care)    Recommendations for Other Services       Precautions / Restrictions Precautions Precautions: Fall Restrictions Weight Bearing Restrictions: No      Mobility Bed Mobility Overal bed mobility: Needs Assistance Bed Mobility: Rolling;Supine to Sit;Sit to Supine Rolling: Mod  assist   Supine to sit: Max assist Sit to supine: Max assist        Transfers Overall transfer level: Needs assistance Equipment used: Rolling walker (2 wheeled) Transfers: Sit to/from Stand Sit to Stand: Max assist;From elevated surface         General transfer comment: unable to weight shift for steps    Balance Overall balance assessment: Needs assistance Sitting-balance support: Single extremity supported;Feet supported Sitting balance-Leahy Scale: Fair     Standing balance support: Bilateral upper extremity supported Standing balance-Leahy Scale: Poor                             ADL either performed or assessed with clinical judgement   ADL Overall ADL's : Needs assistance/impaired                                       General ADL Comments: MOD A for rolling at bed level for bed pan placement, MAX A don B socks seated EOB. MIN A for UBD seated EOB. Anticipate +2 for BSC t/f      Pertinent Vitals/Pain Pain Assessment: 0-10 Pain Score: 6  Pain Location: abdomen Pain Descriptors / Indicators: Discomfort;Grimacing Pain Intervention(s): Limited activity within patient's tolerance;Patient requesting pain meds-RN notified     Hand Dominance     Extremity/Trunk Assessment Upper Extremity Assessment Upper Extremity Assessment: Generalized weakness   Lower Extremity Assessment Lower Extremity Assessment: Generalized weakness (R weaker than L)  Communication Communication Communication: No difficulties   Cognition Arousal/Alertness: Awake/alert Behavior During Therapy: WFL for tasks assessed/performed Overall Cognitive Status: No family/caregiver present to determine baseline cognitive functioning                                 General Comments: Reports date as Tuesday, requires increased time for month of September. Oriented to self/place, cues for complete situation   General Comments  SpO2 95% on RA     Exercises Exercises: Other exercises Other Exercises Other Exercises: Pt educated re: OT role, DME recs, d/c recs, HEP, falls prevention, ECS, importance of mobility for functional strengthening Other Exercises: LBD, UBD, toileting, sup<>sit, rolling, sit<>stand x3, sitting/standing balance/tolerance   Shoulder Instructions      Home Living Family/patient expects to be discharged to:: Assisted living                             Home Equipment: Walker - 4 wheels;Transport planner   Additional Comments: [facility]      Prior Functioning/Environment Level of Independence: Independent with assistive device(s)        Comments: assist for meals/meds        OT Problem List: Decreased strength;Decreased activity tolerance;Impaired balance (sitting and/or standing);Decreased range of motion;Decreased safety awareness      OT Treatment/Interventions: Self-care/ADL training;Therapeutic exercise;Energy conservation;DME and/or AE instruction;Therapeutic activities;Patient/family education;Balance training    OT Goals(Current goals can be found in the care plan section) Acute Rehab OT Goals Patient Stated Goal: to stand OT Goal Formulation: With patient Time For Goal Achievement: 03/11/21 Potential to Achieve Goals: Fair ADL Goals Pt Will Perform Grooming: with modified independence;sitting Pt Will Transfer to Toilet: with mod assist;squat pivot transfer;bedside commode (c LRAD PRN) Pt Will Perform Toileting - Clothing Manipulation and hygiene: sitting/lateral leans;with supervision Pt/caregiver will Perform Home Exercise Program: Increased strength;Both right and left upper extremity;Independently;With theraband  OT Frequency: Min 2X/week    AM-PAC OT "6 Clicks" Daily Activity     Outcome Measure Help from another person eating meals?: None Help from another person taking care of personal grooming?: A Little Help from another person toileting, which  includes using toliet, bedpan, or urinal?: A Lot Help from another person bathing (including washing, rinsing, drying)?: A Lot Help from another person to put on and taking off regular upper body clothing?: A Little Help from another person to put on and taking off regular lower body clothing?: A Lot 6 Click Score: 16   End of Session Equipment Utilized During Treatment: Rolling walker Nurse Communication: Other (comment) (pt on bed pan)  Activity Tolerance: Patient tolerated treatment well Patient left: in bed;with call bell/phone within reach;with bed alarm set  OT Visit Diagnosis: Other abnormalities of gait and mobility (R26.89)                Time: [id number] OT Time Calculation (min): 35 min Charges:  OT General Charges $OT Visit: 1 Visit OT Evaluation $OT Eval Moderate Complexity: 1 Mod OT Treatments $Self Care/Home Management : 23-37 mins  Dessie Coma, M.S. OTR/L  02/25/21, 11:04 AM  ascom [phone]

## 2021-02-25 NOTE — Evaluation (Signed)
Physical Therapy Evaluation Patient Details Name: Laura Gonzalez MRN: [id number] DOB: 1941/01/07 Today's Date: 02/25/2021   History of Present Illness  Pt is a 38 y .o. female presenting to hospital 9/3 with AMS and hallucinations; also nausea and SOB.  Pt admitted with acute encephalopathy, UTI, a-fib, and hypoxic respiratory failure.  PMH includes a-fib, CHF, obesity, htn, CTS, sleep apnea, acute encephalopathy, ACDF, breast Sx, R TKR, B RCR, R wrist sx.  Clinical Impression  Prior to hospital admission, pt was ambulatory with rollator; recently at [facility] but lives at [facility] [place].  Currently pt is mod to max assist with bed mobility; max assist to stand up to walker from elevated bed height (2nd assist present for safety); and pt unable to take any steps with 2 assist and RW use and max cueing (pt reporting limited by pain in R hip).  Pt would benefit from skilled PT to address noted impairments and functional limitations (see below for any additional details).  Upon hospital discharge, pt would benefit from SNF.    Follow Up Recommendations SNF    Equipment Recommendations  Other (comment) (TBD at next facility)    Recommendations for Other Services OT consult     Precautions / Restrictions Precautions Precautions: Fall Restrictions Weight Bearing Restrictions: No      Mobility  Bed Mobility Overal bed mobility: Needs Assistance Bed Mobility: Supine to Sit;Rolling Rolling: Mod assist   Supine to sit: Mod assist;Max assist;HOB elevated Sit to supine: Max assist;HOB elevated   General bed mobility comments: assist for trunk and B LE's; vc's for technique; use of bed rail    Transfers Overall transfer level: Needs assistance Equipment used: Rolling walker (2 wheeled) Transfers: Sit to/from Stand Sit to Stand: Max assist;From elevated surface;+2 safety/equipment         General transfer comment: x3 trials standing (pt only briefly standing 2nd  trial d/t feeling off balance); bed height elevated; vc's for UE/LE placement  Ambulation/Gait             General Gait Details: pt unable to take any steps with walker use and 2 assist and max cueing  Stairs            Wheelchair Mobility    Modified Rankin (Stroke Patients Only)       Balance Overall balance assessment: Needs assistance Sitting-balance support: No upper extremity supported;Feet supported Sitting balance-Leahy Scale: Good Sitting balance - Comments: seady sitting reaching within BOS   Standing balance support: Bilateral upper extremity supported Standing balance-Leahy Scale: Fair Standing balance comment: steady static standing with B UE support on RW                             Pertinent Vitals/Pain Pain Assessment: Faces Pain Score: 6  Faces Pain Scale: Hurts a little bit (2/10 at rest; 4-6/10 with activity) Pain Location: R hip Pain Descriptors / Indicators: Sore Pain Intervention(s): Limited activity within patient's tolerance;Monitored during session;Repositioned Vitals (HR and O2 on room air) stable and WFL throughout treatment session.    Home Living Family/patient expects to be discharged to:: Assisted living               Home Equipment: Walker - 4 wheels;Electric scooter Additional Comments: [facility]    Prior Function Level of Independence: Independent with assistive device(s)         Comments: assist for meals/meds  Hand Dominance        Extremity/Trunk Assessment   Upper Extremity Assessment Upper Extremity Assessment: Generalized weakness    Lower Extremity Assessment Lower Extremity Assessment: Generalized weakness (R appearing weaker than L d/t R hip pain)    Cervical / Trunk Assessment Cervical / Trunk Assessment: Other exceptions Cervical / Trunk Exceptions: forward head/shoulders  Communication   Communication: No difficulties  Cognition  Arousal/Alertness: Awake/alert Behavior During Therapy: WFL for tasks assessed/performed Overall Cognitive Status: No family/caregiver present to determine baseline cognitive functioning                                 General Comments: Oriented to person, place, time, and general situation (pt did not know why she initially came to hospital though)      General Comments     Exercises Transfer training   Assessment/Plan    PT Assessment Patient needs continued PT services  PT Problem List Decreased strength;Decreased activity tolerance;Decreased balance;Decreased mobility;Decreased cognition;Decreased knowledge of use of DME;Decreased knowledge of precautions;Pain       PT Treatment Interventions DME instruction;Gait training;Functional mobility training;Therapeutic activities;Therapeutic exercise;Balance training;Patient/family education    PT Goals (Current goals can be found in the Care Plan section)  Acute Rehab PT Goals Patient Stated Goal: to walk again PT Goal Formulation: With patient Time For Goal Achievement: 03/11/21 Potential to Achieve Goals: Fair    Frequency Min 2X/week   Barriers to discharge Decreased caregiver support      Co-evaluation               AM-PAC PT "6 Clicks" Mobility  Outcome Measure Help needed turning from your back to your side while in a flat bed without using bedrails?: A Lot Help needed moving from lying on your back to sitting on the side of a flat bed without using bedrails?: A Lot Help needed moving to and from a bed to a chair (including a wheelchair)?: Total Help needed standing up from a chair using your arms (e.g., wheelchair or bedside chair)?: Total Help needed to walk in hospital room?: Total Help needed climbing 3-5 steps with a railing? : Total 6 Click Score: 8    End of Session Equipment Utilized During Treatment: Gait belt Activity Tolerance: Patient tolerated treatment well Patient left: in  bed;with call bell/phone within reach;with bed alarm set;Other (comment) (B heels floating via pillow support) Nurse Communication: Mobility status;Precautions (via white board) PT Visit Diagnosis: Other abnormalities of gait and mobility (R26.89);Muscle weakness (generalized) (M62.81);History of falling (Z91.81);Pain Pain - Right/Left: Right Pain - part of body: Hip    Time: [id number] PT Time Calculation (min) (ACUTE ONLY): 36 min   Charges:   PT Evaluation $PT Eval Low Complexity: 1 Low PT Treatments $Therapeutic Activity: 8-22 mins       Leitha Bleak, PT 02/25/21, 12:12 PM

## 2021-02-25 NOTE — NC FL2 (Signed)
[place] LEVEL OF CARE SCREENING TOOL     IDENTIFICATION  Patient Name: Laura Gonzalez Birthdate: [birth year] Sex: female Admission Date (Current Location): 02/22/2021  [facility] and [place] Number:  Engineering geologist and Address:  [facility], [address], [place], [place] [address]      Provider Number: [id number]  Attending Physician Name and Address:  Enzo Bi, MD  Relative Name and Phone Number:       Current Level of Care: Hospital Recommended Level of Care: [place] Prior Approval Number:    Date Approved/Denied:   PASRR Number: [id number]  Discharge Plan: SNF    Current Diagnoses: Patient Active Problem List   Diagnosis Date Noted   Acute encephalopathy 02/23/2021   AMS (altered mental status) 02/23/2021   Acute on chronic diastolic CHF (congestive heart failure) ([place]) 02/25/2020   Obesity, Class III, BMI 40-49.9 (morbid obesity) ([place]) 02/25/2020   Supratherapeutic INR 02/25/2020   Asthma exacerbation 02/18/2020   Acute respiratory failure with hypoxia ([place]) 07/06/2019   Chest pain 12/29/2018   Chronic diastolic heart failure ([place]) 04/22/2018   Atrial fibrillation with RVR ([place]) 04/06/2018   PAD (peripheral artery disease) ([place]) 09/22/2017   AV malformation, acquired ([place]) 09/22/2017   Hemarthrosis involving knee joint, right 08/17/2017   Varicose veins of bilateral lower extremities with other complications 0000000   Chronic venous insufficiency 08/17/2017   Right knee pain 07/25/2017   Gait instability 07/23/2017   Persistent atrial fibrillation ([place]) 12/11/2014   HTN (hypertension) 12/11/2014   GERD (gastroesophageal reflux disease) 12/11/2014   Asthma 12/11/2014    Orientation RESPIRATION BLADDER Height & Weight     Self, Time, Situation, Place  O2 (Nasal Canula 3 L) Incontinent, External catheter Weight: 276 lb 14.4 oz (125.6 kg) Height:  5' 7.5" (171.5 cm)  BEHAVIORAL SYMPTOMS/MOOD  NEUROLOGICAL BOWEL NUTRITION STATUS   (None)  (None) Continent Diet (Regular diet. No added salt - no salt packets on tray.)  AMBULATORY STATUS COMMUNICATION OF NEEDS Skin   Extensive Assist Verbally Skin abrasions, Bruising, Other (Comment) (MASD right and left buttocks: Foam.)                       Personal Care Assistance Level of Assistance  Bathing, Feeding, Dressing Bathing Assistance: Maximum assistance Feeding assistance: Limited assistance Dressing Assistance: Maximum assistance     Functional Limitations Info  Sight, Hearing, Speech Sight Info: Adequate Hearing Info: Adequate Speech Info: Adequate    SPECIAL CARE FACTORS FREQUENCY  PT (By licensed PT), OT (By licensed OT)     PT Frequency: 5 x week OT Frequency: 5 x week            Contractures Contractures Info: Not present    Additional Factors Info  Code Status, Allergies Code Status Info: Full code Allergies Info: Amoxicillin, Benzocaine-menthol, Chloraseptic Sore Throat (Acetaminophen), Biafine (Wound Dressings), Chlorpheniramine, Neosporin (Neomycin-bacitracin Zn-polymyx), Shellfish Allergy, Statins, Tape           Current Medications (02/25/2021):  This is the current hospital active medication list Current Facility-Administered Medications  Medication Dose Route Frequency Provider Last Rate Last Admin   0.9 %  sodium chloride infusion  250 mL Intravenous PRN Opyd, Ilene Qua, MD       acetaminophen (TYLENOL) tablet 650 mg  650 mg Oral Q6H PRN Opyd, Ilene Qua, MD       Or   acetaminophen (TYLENOL) suppository 650 mg  650 mg Rectal Q6H  PRN Opyd, Ilene Qua, MD       albuterol (PROVENTIL) (2.5 MG/3ML) 0.083% nebulizer solution 2.5 mg  2.5 mg Inhalation Q4H PRN Opyd, Ilene Qua, MD       amiodarone (PACERONE) tablet 200 mg  200 mg Oral Daily Enzo Bi, MD   200 mg at 02/25/21 0919   apixaban (ELIQUIS) tablet 5 mg  5 mg Oral BID Enzo Bi, MD   5 mg at 02/25/21 0919   ceFAZolin (ANCEF) IVPB 1 g/50 mL  premix  1 g Intravenous Q8H Enzo Bi, MD       chlorhexidine (PERIDEX) 0.12 % solution 15 mL  15 mL Mouth Rinse BID Enzo Bi, MD   15 mL at 02/25/21 0919   diltiazem (CARDIZEM CD) 24 hr capsule 300 mg  300 mg Oral Daily Enzo Bi, MD   300 mg at 02/25/21 0919   feeding supplement (ENSURE ENLIVE / ENSURE PLUS) liquid 237 mL  237 mL Oral BID BM Enzo Bi, MD   237 mL at 02/25/21 0920   metoprolol tartrate (LOPRESSOR) tablet 50 mg  50 mg Oral BID Enzo Bi, MD   50 mg at 02/25/21 0919   montelukast (SINGULAIR) tablet 10 mg  10 mg Oral QHS Enzo Bi, MD   10 mg at 02/24/21 2105   ondansetron (ZOFRAN) tablet 4 mg  4 mg Oral Q6H PRN Opyd, Ilene Qua, MD       Or   ondansetron (ZOFRAN) injection 4 mg  4 mg Intravenous Q6H PRN Opyd, Ilene Qua, MD       senna-docusate (Senokot-S) tablet 1 tablet  1 tablet Oral QHS PRN Opyd, Ilene Qua, MD         Discharge Medications: Please see discharge summary for a list of discharge medications.  Relevant Imaging Results:  Relevant Lab Results:   Additional Information SS#: [id number]  Candie Chroman, LCSW

## 2021-02-25 NOTE — Progress Notes (Signed)
PROGRESS NOTE    [name]  [id number] DOB: 12-Dec-1940 DOA: 02/22/2021 PCP: Baxter Hire, MD  [phone]   Assessment & Plan:   Principal Problem:   Acute encephalopathy Active Problems:   Persistent atrial fibrillation (HCC)   Asthma   Chronic diastolic heart failure ([place])   AMS (altered mental status)   Laura Gonzalez is a 80 y.o. female with medical history significant for atrial fibrillation on anticoagulation, BMI 43, chronic diastolic CHF, and asthma, now presenting to the emergency department from her SNF where she has reportedly been confused and hallucinating.    Acute encephalopathy, resolved --likely due to UTI, since encephalopathy resolved only after starting abx, and workup thus far showed nothing else acute. Plan: --treat UTI  UTI 2/2 AEROCOCCUS URINAE, POA --started on ceftriaxone on 9/4 and mental status improved on 9/5 Plan: --narrow to Ancef today --can discharge on Keflex for a 7-day course tomorrow  2. paroxsymal Atrial fibrillation on Eliquis - In SR on admission  --cont home amiodarone, cardizem and Lopressor --cont home Eliquis   3. Chronic diastolic CHF  - Appears compensated    4. Asthma  - No cough or wheeze on admission  - Albuterol as needed    5. Hypoxic respiratory failure  - She was on 3 Lpm supplemental O2 on admission with saturations mid-90s  - Unclear etiology, maybe due to somnolence and apnea - No acute CXR findings, no rales or wheezes on exam   --wean to RA  Reduced appetite --nutrition consult --Liberalize diet to regular  --Ensure BID   DVT prophylaxis: [id number] Code Status: Full code  Family Communication:  Level of care: Med-Surg Dispo:   The patient is from: SNF rehab Anticipated d/c is to: SNF rehab Anticipated d/c date is: tomorrow Patient currently is not ready to d/c due to: on IV abx   Subjective and Interval History:  Pt reported feeling better.  Had recorded BM yesterday, but pt  felt constipated.  Drinking her Ensure.     Objective: Vitals:   02/24/21 1510 02/24/21 2053 02/25/21 0411 02/25/21 0730  BP: (!) 147/66 (!) 168/66 (!) 155/67 139/65  Pulse: 88 85 67 73  Resp: '18 16 16 20  '$ Temp:  98.1 F (36.7 C) 98 F (36.7 C) 97.9 F (36.6 C)  TempSrc:  Oral  Oral  SpO2: 97% 96% 98% 95%  Weight:      Height:        Intake/Output Summary (Last 24 hours) at 02/25/2021 1534 Last data filed at 02/25/2021 1500 Gross per 24 hour  Intake 647.06 ml  Output 500 ml  Net 147.06 ml   Filed Weights   02/22/21 2353 02/23/21 0513  Weight: 130.7 kg 125.6 kg    Examination:   Constitutional: NAD, AAOx3 HEENT: conjunctivae and lids normal, EOMI CV: No cyanosis.   RESP: normal respiratory effort Extremities: No effusions, edema in BLE SKIN: warm, dry Neuro: II - XII grossly intact.   Psych: Normal mood and affect.  Appropriate judgement and reason   Data Reviewed: I have personally reviewed following labs and imaging studies  CBC: Recent Labs  Lab 02/22/21 2357 02/24/21 0415 02/25/21 0609  WBC 12.0* 9.6 10.1  NEUTROABS 8.8*  --   --   HGB 14.5 13.8 12.9  HCT 44.5 43.1 40.6  MCV 101.4* 103.4* 101.5*  PLT 347 309 0000000   Basic Metabolic Panel: Recent Labs  Lab 02/22/21 2357 02/24/21 0415 02/25/21 0609  NA 139 143 139  K  5.0 4.1 4.5  CL 103 108 104  CO2 '25 25 28  '$ GLUCOSE 120* 84 114*  BUN '15 17 15  '$ CREATININE 0.93 0.86 0.68  CALCIUM 10.1 8.9 8.9  MG  --  2.6* 2.6*   GFR: Estimated Creatinine Clearance: 79.1 mL/min (by C-G formula based on SCr of 0.68 mg/dL). Liver Function Tests: Recent Labs  Lab 02/22/21 2357 02/24/21 0415  AST 38 41  ALT 48* 52*  ALKPHOS 128* 127*  BILITOT 1.5* 1.4*  PROT 7.8 6.9  ALBUMIN 3.7 3.5   No results for input(s): LIPASE, AMYLASE in the last 168 hours. Recent Labs  Lab 02/23/21 0651  AMMONIA 13   Coagulation Profile: Recent Labs  Lab 02/23/21 0651  INR 1.6*   Cardiac Enzymes: No results for  input(s): CKTOTAL, CKMB, CKMBINDEX, TROPONINI in the last 168 hours. BNP (last 3 results) No results for input(s): PROBNP in the last 8760 hours. HbA1C: No results for input(s): HGBA1C in the last 72 hours. CBG: Recent Labs  Lab 02/23/21 1210  GLUCAP 108*   Lipid Profile: No results for input(s): CHOL, HDL, LDLCALC, TRIG, CHOLHDL, LDLDIRECT in the last 72 hours. Thyroid Function Tests: Recent Labs    02/23/21 0651  TSH 2.789   Anemia Panel: Recent Labs    02/23/21 0651  VITAMINB12 5,645*   Sepsis Labs: Recent Labs  Lab 02/22/21 2357  LATICACIDVEN 1.1    Recent Results (from the past 240 hour(s))  Resp Panel by RT-PCR (Flu A&B, Covid) Nasopharyngeal Swab     Status: None   Collection Time: 02/22/21 11:57 PM   Specimen: Nasopharyngeal Swab; Nasopharyngeal(NP) swabs in vial transport medium  Result Value Ref Range Status   SARS Coronavirus 2 by RT PCR NEGATIVE NEGATIVE Final    Comment: (NOTE) SARS-CoV-2 target nucleic acids are NOT DETECTED.  The SARS-CoV-2 RNA is generally detectable in upper respiratory specimens during the acute phase of infection. The lowest concentration of SARS-CoV-2 viral copies this assay can detect is 138 copies/mL. A negative result does not preclude SARS-Cov-2 infection and should not be used as the sole basis for treatment or other patient management decisions. A negative result may occur with  improper specimen collection/handling, submission of specimen other than nasopharyngeal swab, presence of viral mutation(s) within the areas targeted by this assay, and inadequate number of viral copies(<138 copies/mL). A negative result must be combined with clinical observations, patient history, and epidemiological information. The expected result is Negative.  Fact Sheet for Patients:  EntrepreneurPulse.com.au  Fact Sheet for Healthcare Providers:  IncredibleEmployment.be  This test is no t yet  approved or cleared by the [place] FDA and  has been authorized for detection and/or diagnosis of SARS-CoV-2 by FDA under an Emergency Use Authorization (EUA). This EUA will remain  in effect (meaning this test can be used) for the duration of the COVID-19 declaration under Section 564(b)(1) of the Act, 21 U.S.C.section 360bbb-3(b)(1), unless the authorization is terminated  or revoked sooner.       Influenza A by PCR NEGATIVE NEGATIVE Final   Influenza B by PCR NEGATIVE NEGATIVE Final    Comment: (NOTE) The Xpert Xpress SARS-CoV-2/FLU/RSV plus assay is intended as an aid in the diagnosis of influenza from Nasopharyngeal swab specimens and should not be used as a sole basis for treatment. Nasal washings and aspirates are unacceptable for Xpert Xpress SARS-CoV-2/FLU/RSV testing.  Fact Sheet for Patients: EntrepreneurPulse.com.au  Fact Sheet for Healthcare Providers: IncredibleEmployment.be  This test is not yet approved or cleared  by the [place] and has been authorized for detection and/or diagnosis of SARS-CoV-2 by FDA under an Emergency Use Authorization (EUA). This EUA will remain in effect (meaning this test can be used) for the duration of the COVID-19 declaration under Section 564(b)(1) of the Act, 21 U.S.C. section 360bbb-3(b)(1), unless the authorization is terminated or revoked.  Performed at [facility], [address], [place], [place] [address]   Urine Culture     Status: Abnormal   Collection Time: 02/23/21  8:39 AM   Specimen: Urine, Random  Result Value Ref Range Status   Specimen Description   Final    URINE, RANDOM Performed at [facility], [address], [place], [place] [address]    Special Requests   Final    NONE Performed at [facility], [address] [place], [place] [address]    Culture (A)  Final    >=100,000 COLONIES/mL AEROCOCCUS URINAE Standardized  susceptibility testing for this organism is not available. Performed at [facility], [place] [address], [place], [place] [address]    Report Status 02/25/2021 FINAL  Final      Radiology Studies: No results found.   Scheduled Meds:  amiodarone  200 mg Oral Daily   apixaban  5 mg Oral BID   chlorhexidine  15 mL Mouth Rinse BID   diltiazem  300 mg Oral Daily   feeding supplement  237 mL Oral BID BM   metoprolol tartrate  50 mg Oral BID   montelukast  10 mg Oral QHS   polyethylene glycol  34 g Oral BID   Continuous Infusions:  sodium chloride      ceFAZolin (ANCEF) IV 1 g (02/25/21 1443)     LOS: 2 days     Enzo Bi, MD [facility] If 7PM-7AM, please contact night-coverage 02/25/2021, 3:34 PM

## 2021-02-26 LAB — CBC
HCT: 38.7 % (ref 36.0–46.0)
Hemoglobin: 12.6 g/dL (ref 12.0–15.0)
MCH: 32.8 pg (ref 26.0–34.0)
MCHC: 32.6 g/dL (ref 30.0–36.0)
MCV: 100.8 fL — ABNORMAL HIGH (ref 80.0–100.0)
Platelets: 261 10*3/uL (ref 150–400)
RBC: 3.84 MIL/uL — ABNORMAL LOW (ref 3.87–5.11)
RDW: 14.5 % (ref 11.5–15.5)
WBC: 9.4 10*3/uL (ref 4.0–10.5)
nRBC: 0 % (ref 0.0–0.2)

## 2021-02-26 LAB — BASIC METABOLIC PANEL
Anion gap: 6 (ref 5–15)
BUN: 15 mg/dL (ref 8–23)
CO2: 29 mmol/L (ref 22–32)
Calcium: 8.9 mg/dL (ref 8.9–10.3)
Chloride: 104 mmol/L (ref 98–111)
Creatinine, Ser: 0.61 mg/dL (ref 0.44–1.00)
GFR, Estimated: >60 mL/min (ref >60)
Glucose, Bld: 102 mg/dL — ABNORMAL HIGH (ref 70–99)
Potassium: 4.5 mmol/L (ref 3.5–5.1)
Sodium: 139 mmol/L (ref 135–145)

## 2021-02-26 LAB — MAGNESIUM: Magnesium: 2.5 mg/dL — ABNORMAL HIGH (ref 1.7–2.4)

## 2021-02-26 LAB — T.PALLIDUM AB, TOTAL: T Pallidum Abs: NONREACTIVE

## 2021-02-26 MED ORDER — DILTIAZEM HCL ER COATED BEADS 300 MG PO CP24: 300.0000 mg | ORAL_CAPSULE | Freq: Every day | ORAL | 0 refills | Status: DC

## 2021-02-26 MED ORDER — CEPHALEXIN 500 MG PO CAPS: 500.0000 mg | ORAL_CAPSULE | Freq: Two times a day (BID) | ORAL | 0 refills | Status: AC

## 2021-02-26 NOTE — Care Management Important Message (Signed)
Important Message  Patient Details  Name: Laura Gonzalez MRN: [id number] Date of Birth: [birth year]   Medicare Important Message Given:  N/A - LOS <3 / Initial given by admissions  Initial Medicare IM reviewed with patient at [phone] by Tomma Lightning, Patient Access Associate on 02/25/2021 at 11:10am.    [name] 02/26/2021, 9:46 AM

## 2021-02-26 NOTE — TOC Transition Note (Signed)
Transition of Care [facility]) - CM/SW Discharge Note   Patient Details  Name: Laura Gonzalez MRN: [id number] Date of Birth: January 14, 1941  [facility] CM/SW Contact:  Candie Chroman, LCSW Phone Number: 02/26/2021, 1:24 PM   Clinical Narrative:   Patient has orders to discharge to [facility] SNF today. RN will call report to [phone] (Room 354). EMS transport arranged for 3:00. No further concerns. CSW signing off.  Final next level of care: Skilled Nursing Facility Barriers to Discharge: Barriers Resolved   Patient Goals and CMS Choice Patient states their goals for this hospitalization and ongoing recovery are:: to return to rehab CMS Medicare.gov Compare Post Acute Care list provided to:: Patient Represenative (must comment) Choice offered to / list presented to : Patient  Discharge Placement   Existing PASRR number confirmed : 02/25/21          Patient chooses bed at: [facility] Patient to be transferred to facility by: EMS Name of family member notified: Patient declined. Said she would call Mr. Bretta Bang mother when she arrived at [facility]. Patient and family notified of of transfer: 02/26/21  Discharge Plan and Services                                     Social Determinants of Health (SDOH) Interventions     Readmission Risk Interventions No flowsheet data found.

## 2021-02-26 NOTE — Discharge Summary (Signed)
Physician Discharge Summary  Laura Gonzalez [id number] DOB: October 06, 1940 DOA: 02/22/2021  PCP: Baxter Hire, MD  Admit date: 02/22/2021 Discharge date: 02/26/2021  Admitted From: SNF Disposition:  SNF  Recommendations for Outpatient Follow-up:  Follow up with PCP in 1-2 weeks  Discharge Condition:Stable CODE STATUS:Full Diet recommendation: Regular   Brief/Interim Summary: 80 y.o. female with medical history significant for atrial fibrillation on anticoagulation, BMI 43, chronic diastolic CHF, and asthma, now presenting to the emergency department from her SNF where she has reportedly been confused and hallucinating.   Discharge Diagnoses:  Principal Problem:   Acute encephalopathy Active Problems:   Persistent atrial fibrillation (HCC)   Asthma   Chronic diastolic heart failure (HCC)   AMS (altered mental status)  Acute encephalopathy, resolved --likely due to UTI, since encephalopathy resolved only after starting abx, and workup thus far showed nothing else acute. Plan: --complete course of UTI treatment per below   UTI 2/2 AEROCOCCUS URINAE, POA --started on ceftriaxone on 9/4 and mental status improved on 9/5 Plan: --narrowed to Ancef and will complete keflex on d/c for total of 7 day course   2. paroxsymal Atrial fibrillation on Eliquis - In SR on admission  --cont home amiodarone, cardizem and Lopressor --cont home Eliquis   3. Chronic diastolic CHF  - Appears compensated    4. Asthma  - No cough or wheeze on admission  - Albuterol as needed    5. Hypoxic respiratory failure  - She was on 3 Lpm supplemental O2 on admission with saturations mid-90s  - Unclear etiology, maybe due to somnolence and apnea - No acute CXR findings, no rales or wheezes on exam   --weaned to RA   Reduced appetite --nutrition consult --Liberalize diet to regular  --Ensure BID    Discharge Instructions   Allergies as of 02/26/2021       Reactions   Amoxicillin Other (See  Comments)   Lips swelling, tingling Has patient had a PCN reaction causing immediate rash, facial/tongue/throat swelling, SOB or lightheadedness with hypotension: Yes Has patient had a PCN reaction causing severe rash involving mucus membranes or skin necrosis: No Has patient had a PCN reaction that required hospitalization: No Has patient had a PCN reaction occurring within the last 10 years: No If all of the above answers are "NO", then may proceed with Cephalosporin use.   Benzocaine-menthol    Throat swelling   Chloraseptic Sore Throat [acetaminophen] Other (See Comments)   throat swelling   Biafine [wound Dressings] Swelling   Chlorpheniramine    Throat swelling   Neosporin [neomycin-bacitracin Zn-polymyx] Other (See Comments)   Skin redness, puffy   Shellfish Allergy Swelling   "lips swell"   Statins Other (See Comments)   Muscle weakness, weak   Tape Other (See Comments)   Skin redness        Medication List     STOP taking these medications    furosemide 20 MG tablet Commonly known as: LASIX   potassium chloride SA 20 MEQ tablet Commonly known as: KLOR-CON   traMADol 50 MG tablet Commonly known as: Ultram       TAKE these medications    acetaminophen 500 MG tablet Commonly known as: TYLENOL Take 1,000 mg by mouth every 6 (six) hours as needed.   alendronate 70 MG tablet Commonly known as: FOSAMAX Take 70 mg by mouth every Sunday. Take with a full glass of water on an empty stomach. On Sundays   amiodarone 200 MG tablet Commonly  known as: PACERONE Take 1 tablet (200 mg total) by mouth 2 (two) times daily. What changed: when to take this   ammonium lactate 12 % cream Commonly known as: AMLACTIN Apply 1 application topically daily as needed.   AYR SALINE NASAL GEL NA Place 1 application into the nose as needed (congestion).   B-12 3000 MCG Caps Take 3,000 mcg by mouth daily.   calcium carbonate 500 MG chewable tablet Commonly known as: TUMS -  dosed in mg elemental calcium Chew 1 tablet (200 mg of elemental calcium total) by mouth 2 (two) times daily.   cephALEXin 500 MG capsule Commonly known as: KEFLEX Take 1 capsule (500 mg total) by mouth 2 (two) times daily for 5 days.   CITRACAL PO Take 1 tablet by mouth 2 (two) times daily.   CoQ-10 100 MG capsule Take 200 mg by mouth daily.   diltiazem 300 MG 24 hr capsule Commonly known as: CARDIZEM CD Take 1 capsule (300 mg total) by mouth daily. What changed: Another medication with the same name was removed. Continue taking this medication, and follow the directions you see here.   Eliquis 5 MG Tabs tablet Generic drug: apixaban Take 1 tablet by mouth 2 (two) times daily.   ezetimibe 10 MG tablet Commonly known as: ZETIA Take 10 mg by mouth daily.   feeding supplement Liqd Take 237 mLs by mouth 2 (two) times daily between meals.   fluticasone 50 MCG/ACT nasal spray Commonly known as: FLONASE Place 1 spray into both nostrils daily as needed for allergies.   ipratropium 0.02 % nebulizer solution Commonly known as: ATROVENT Take 2.5 mLs (0.5 mg total) by nebulization 3 (three) times daily.   levalbuterol 0.63 MG/3ML nebulizer solution Commonly known as: XOPENEX Take 5.9524 mLs (1.25 mg total) by nebulization 3 (three) times daily.   loratadine 10 MG tablet Commonly known as: CLARITIN Take 10 mg by mouth daily.   magnesium oxide 400 MG tablet Commonly known as: MAG-OX Take 800 mg by mouth every evening.   MegaRed Omega-3 Krill Oil 500 MG Caps Take 500 mg by mouth every morning.   metoprolol tartrate 50 MG tablet Commonly known as: LOPRESSOR Take 1 tablet (50 mg total) by mouth 2 (two) times daily.   montelukast 10 MG tablet Commonly known as: SINGULAIR Take 10 mg by mouth at bedtime.   ONE-A-DAY WOMENS PO Take 1 tablet by mouth 2 (two) times daily.   polyethylene glycol 17 g packet Commonly known as: MIRALAX / GLYCOLAX Take 17 g by mouth daily as  needed for mild constipation.   Probiotic Caps Take 1 capsule by mouth daily.   SYSTANE OP Place 1 drop into both eyes at bedtime as needed (allergies).   triamcinolone cream 0.1 % Commonly known as: KENALOG Apply 1 application topically daily.   trolamine salicylate 10 % cream Commonly known as: ASPERCREME Apply 1 application topically 3 (three) times daily as needed (knee pain).   Vitamin D-3 125 MCG (5000 UT) Tabs Take 2,000 Units by mouth daily.        Follow-up Information     Baxter Hire, MD Follow up in 2 week(s).   Specialty: Internal Medicine Why: Hospital follow up Contact information: [place] [address] [phone]                Allergies  Allergen Reactions   Amoxicillin Other (See Comments)    Lips swelling, tingling Has patient had a PCN reaction causing immediate  rash, facial/tongue/throat swelling, SOB or lightheadedness with hypotension: Yes Has patient had a PCN reaction causing severe rash involving mucus membranes or skin necrosis: No Has patient had a PCN reaction that required hospitalization: No Has patient had a PCN reaction occurring within the last 10 years: No If all of the above answers are "NO", then may proceed with Cephalosporin use.    Benzocaine-Menthol     Throat swelling   Chloraseptic Sore Throat [Acetaminophen] Other (See Comments)    throat swelling   Biafine [Wound Dressings] Swelling   Chlorpheniramine     Throat swelling   Neosporin [Neomycin-Bacitracin Zn-Polymyx] Other (See Comments)    Skin redness, puffy   Shellfish Allergy Swelling    "lips swell"   Statins Other (See Comments)    Muscle weakness, weak   Tape Other (See Comments)    Skin redness    Procedures/Studies: DG Chest 2 View  Result Date: 02/19/2021 CLINICAL DATA:  Fall with pain and swelling EXAM: CHEST - 2 VIEW COMPARISON:  02/18/2020 FINDINGS: Surgical hardware in the cervical spine. Low lung volumes.  Postsurgical changes at the right humeral head. Cardiomegaly without focal opacity, pleural effusion, or pneumothorax. IMPRESSION: No active cardiopulmonary disease. Low lung volumes with cardiomegaly. Electronically Signed   By: Donavan Foil M.D.   On: 02/19/2021 15:37   DG Thoracic Spine 2 View  Result Date: 02/19/2021 CLINICAL DATA:  Fall EXAM: THORACIC SPINE 2 VIEWS COMPARISON:  02/07/2021 FINDINGS: Surgical hardware in the cervical spine. Vertebral body heights are grossly maintained. Diffuse degenerative changes with multilevel osteophytes. IMPRESSION: Degenerative changes.  No acute osseous abnormality. Electronically Signed   By: Donavan Foil M.D.   On: 02/19/2021 15:27   DG Thoracic Spine 2 View  Result Date: 02/07/2021 CLINICAL DATA:  Fall. EXAM: THORACIC SPINE 2 VIEWS COMPARISON:  None. FINDINGS: Limited by soft tissue attenuation. Diffuse disc space narrowing and spondylitic spurring with multilevel bridging osteophyte. No evidence of fracture. Maintained posterior mediastinal fat planes. IMPRESSION: 1. No acute finding. 2. Diffuse spondylosis with bridging osteophytes. 3. Limited lateral views due to patient anatomy Electronically Signed   By: Monte Fantasia M.D.   On: 02/07/2021 07:46   DG Lumbar Spine 2-3 Views  Result Date: 02/19/2021 CLINICAL DATA:  Fall with pain EXAM: LUMBAR SPINE - 2-3 VIEW COMPARISON:  02/07/2021 FINDINGS: Mild scoliosis. Evidence of prior hernia repair. Stable grade 1 anterolisthesis L4 on L5. Trace retrolisthesis L2 on L3. Vertebral body heights are grossly maintained. Diffuse degenerative changes with mild to moderate disc space narrowing at L4-L5 and L5-S1. Facet degenerative changes at multiple levels. Right kidney stone. IMPRESSION: 1. Scoliosis and degenerative changes.  No acute osseous abnormality 2. Right kidney stone Electronically Signed   By: Donavan Foil M.D.   On: 02/19/2021 15:30   DG Lumbar Spine Complete  Result Date: 02/07/2021 CLINICAL  DATA:  Fall. EXAM: LUMBAR SPINE - COMPLETE 4+ VIEW COMPARISON:  MRI lumbar spine dated Nov 13, 2020. FINDINGS: There is no evidence of lumbar spine fracture. Unchanged mild anterolisthesis at L4-L5. Unchanged multilevel disc height loss and facet arthropathy. 6 mm right renal calculus. IMPRESSION: 1. No acute osseous abnormality. Electronically Signed   By: Titus Dubin M.D.   On: 02/07/2021 07:54   DG Elbow Complete Left  Result Date: 02/19/2021 CLINICAL DATA:  Fall with pain EXAM: LEFT ELBOW - COMPLETE 3+ VIEW COMPARISON:  07/09/2009 FINDINGS: No definite acute displaced fracture or malalignment. Negative for elbow effusion. IMPRESSION: No acute osseous abnormality Electronically Signed  By: Donavan Foil M.D.   On: 02/19/2021 15:36   DG Ankle Complete Right  Result Date: 02/07/2021 CLINICAL DATA:  Mechanical fall with right ankle pain EXAM: RIGHT ANKLE - COMPLETE 3+ VIEW COMPARISON:  None. FINDINGS: There is no evidence of fracture, dislocation, or joint effusion. Generalized soft tissue swelling is possible. No evidence of ankle joint effusion. Heel spurs. Osteopenia. IMPRESSION: Negative for fracture or subluxation. Electronically Signed   By: Monte Fantasia M.D.   On: 02/07/2021 04:41   CT HEAD WO CONTRAST (5MM)  Result Date: 02/23/2021 CLINICAL DATA:  Mental status change with unknown cause EXAM: CT HEAD WITHOUT CONTRAST TECHNIQUE: Contiguous axial images were obtained from the base of the skull through the vertex without intravenous contrast. COMPARISON:  02/19/2021 FINDINGS: Brain: No evidence of acute infarction, hemorrhage, hydrocephalus, extra-axial collection or mass lesion/mass effect. Vascular: No hyperdense vessel or unexpected calcification. Skull: Negative for fracture or focal lesion. Mild hyperostosis interna. 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 Sinuses/Orbits: Negative IMPRESSION: No acute or interval finding. Electronically Signed   By: Monte Fantasia M.D.   On: 02/23/2021 04:14   CT HEAD WO CONTRAST (5MM)  Result Date: 02/19/2021 CLINICAL DATA:  Head trauma, minor EXAM: CT HEAD WITHOUT CONTRAST TECHNIQUE: Contiguous axial images were obtained from the base of the skull through the vertex without intravenous contrast. COMPARISON:  2018 FINDINGS: Brain: There is no acute intracranial hemorrhage, mass effect, or edema. Gray-white differentiation is preserved. There is no extra-axial fluid collection. Ventricles and sulci are within normal limits in size and configuration. Patchy low-attenuation in the supratentorial white matter is nonspecific but may reflect mild chronic microvascular ischemic changes. Vascular: There is atherosclerotic calcification at the skull base. Skull: Calvarium is unremarkable. Sinuses/Orbits: No acute finding. Other: None. IMPRESSION: No evidence of acute intracranial injury. Electronically Signed   By: Macy Mis M.D.   On: 02/19/2021 15:53   CT Cervical Spine Wo Contrast  Result Date: 02/19/2021 CLINICAL DATA:  Neck trauma EXAM: CT CERVICAL SPINE WITHOUT CONTRAST TECHNIQUE: Multidetector CT imaging of the cervical spine was performed without intravenous contrast. Multiplanar CT image reconstructions were also generated. COMPARISON:  None. FINDINGS: Alignment: No significant listhesis. Skull base and vertebrae: No acute fracture. Postoperative changes of anterior fusion are present at C3-C5 without evidence of hardware complication. Soft tissues and spinal canal: No prevertebral fluid or swelling. No visible canal hematoma. Disc levels: Multilevel degenerative changes are present. No high-grade osseous encroachment on the spinal canal. Upper chest: No apical lung mass. Other: None. IMPRESSION: No acute cervical spine fracture. Electronically Signed   By: Macy Mis M.D.   On: 02/19/2021 15:56   DG Chest Portable 1 View  Result Date: 02/23/2021 CLINICAL DATA:  Altered mental status, hypoxia EXAM: PORTABLE CHEST 1  VIEW COMPARISON:  02/19/2021 FINDINGS: Lung volumes are small with left basilar atelectasis noted. No superimposed confluent pulmonary infiltrate. No pneumothorax or pleural effusion. Cardiac size is within normal limits when accounting for poor pulmonary insufflation. Pulmonary vascularity is normal. No acute bone abnormality. IMPRESSION: Pulmonary hypoinflation. Electronically Signed   By: Fidela Salisbury M.D.   On: 02/23/2021 00:34   DG Knee Complete 4 Views Right  Result Date: 02/07/2021 CLINICAL DATA:  Fall. EXAM: RIGHT KNEE - COMPLETE 4+ VIEW COMPARISON:  Right knee x-rays dated July 23, 2017. FINDINGS: Prior right total knee arthroplasty. New lucency along the anterior aspect of the femoral component. No acute fracture or dislocation. Unchanged small joint effusion. Two  embolization coils noted near the distal femur. IMPRESSION: 1. No acute osseous abnormality. 2. New lucency along the anterior aspect of the femoral component, concerning for loosening. Electronically Signed   By: Titus Dubin M.D.   On: 02/07/2021 07:57   DG Foot Complete Left  Result Date: 02/19/2021 CLINICAL DATA:  Fall with pain and swelling EXAM: LEFT FOOT - COMPLETE 3+ VIEW COMPARISON:  None. FINDINGS: No fracture or malalignment. Mild degenerative change at the first MTP joint. Vascular calcifications. Large plantar calcaneal spur. IMPRESSION: No acute osseous abnormality Electronically Signed   By: Donavan Foil M.D.   On: 02/19/2021 15:25   DG Foot Complete Right  Result Date: 02/19/2021 CLINICAL DATA:  Fall with foot pain EXAM: RIGHT FOOT COMPLETE - 3+ VIEW COMPARISON:  02/07/2021 FINDINGS: Limited by flexed positioning at the MTP joints. No definitive fracture seen. Large plantar calcaneal spur. Vascular calcifications. IMPRESSION: Slightly limited by positioning. No definite acute osseous abnormality Electronically Signed   By: Donavan Foil M.D.   On: 02/19/2021 15:34   DG Foot Complete Right  Result Date:  02/07/2021 CLINICAL DATA:  Fall. EXAM: RIGHT FOOT COMPLETE - 3+ VIEW COMPARISON:  None. FINDINGS: No acute fracture or dislocation. Joint spaces are preserved. Dorsal forefoot soft tissue swelling. Prominent calcaneal enthesophytes. Osteopenia. IMPRESSION: 1. Dorsal forefoot soft tissue swelling. No acute osseous abnormality. Electronically Signed   By: Titus Dubin M.D.   On: 02/07/2021 07:58   DG Hip Unilat W or Wo Pelvis 2-3 Views Left  Result Date: 02/19/2021 CLINICAL DATA:  Fall with pain and swelling EXAM: DG HIP (WITH OR WITHOUT PELVIS) 2-3V LEFT COMPARISON:  None. FINDINGS: SI joints are non widened. No fracture or malalignment left hip. Pubic symphysis appears intact. Right femoral head projects in joint. There are mild degenerative changes. Possible mild fracture deformity of right superior pubic ramus of uncertain acuity. IMPRESSION: 1. No acute osseous abnormality left hip. 2. Possible age indeterminate fracture deformity of right superior pubic ramus Electronically Signed   By: Donavan Foil M.D.   On: 02/19/2021 15:32    Subjective: Feeling generally weak  Discharge Exam: Vitals:   02/26/21 0325 02/26/21 0730  BP: 138/61 (!) 148/70  Pulse: 64 69  Resp: 16 18  Temp:  98.3 F (36.8 C)  SpO2: 95% 95%   Vitals:   02/25/21 1546 02/25/21 1927 02/26/21 0325 02/26/21 0730  BP: (!) 147/56 (!) 158/57 138/61 (!) 148/70  Pulse: 64 67 64 69  Resp: '20 16 16 18  '$ Temp: 98.4 F (36.9 C) 98.4 F (36.9 C)  98.3 F (36.8 C)  TempSrc: Oral   Oral  SpO2: 96% 94% 95% 95%  Weight:      Height:        General: Pt is alert, awake, not in acute distress Cardiovascular: RRR, S1/S2 + Respiratory: CTA bilaterally, no wheezing, no rhonchi Abdominal: Soft, NT, ND, bowel sounds + Extremities: no edema, no cyanosis, small skin tear over L ankle with bandage overtop, granulation, nonbleeding, no erythema, no drainage   The results of significant diagnostics from this hospitalization (including  imaging, microbiology, ancillary and laboratory) are listed below for reference.     Microbiology: Recent Results (from the past 240 hour(s))  Resp Panel by RT-PCR (Flu A&B, Covid) Nasopharyngeal Swab     Status: None   Collection Time: 02/22/21 11:57 PM   Specimen: Nasopharyngeal Swab; Nasopharyngeal(NP) swabs in vial transport medium  Result Value Ref Range Status   SARS Coronavirus 2 by RT PCR  NEGATIVE NEGATIVE Final    Comment: (NOTE) SARS-CoV-2 target nucleic acids are NOT DETECTED.  The SARS-CoV-2 RNA is generally detectable in upper respiratory specimens during the acute phase of infection. The lowest concentration of SARS-CoV-2 viral copies this assay can detect is 138 copies/mL. A negative result does not preclude SARS-Cov-2 infection and should not be used as the sole basis for treatment or other patient management decisions. A negative result may occur with  improper specimen collection/handling, submission of specimen other than nasopharyngeal swab, presence of viral mutation(s) within the areas targeted by this assay, and inadequate number of viral copies(<138 copies/mL). A negative result must be combined with clinical observations, patient history, and epidemiological information. The expected result is Negative.  Fact Sheet for Patients:  EntrepreneurPulse.com.au  Fact Sheet for Healthcare Providers:  IncredibleEmployment.be  This test is no t yet approved or cleared by the [place] FDA and  has been authorized for detection and/or diagnosis of SARS-CoV-2 by FDA under an Emergency Use Authorization (EUA). This EUA will remain  in effect (meaning this test can be used) for the duration of the COVID-19 declaration under Section 564(b)(1) of the Act, 21 U.S.C.section 360bbb-3(b)(1), unless the authorization is terminated  or revoked sooner.       Influenza A by PCR NEGATIVE NEGATIVE Final   Influenza B by PCR NEGATIVE  NEGATIVE Final    Comment: (NOTE) The Xpert Xpress SARS-CoV-2/FLU/RSV plus assay is intended as an aid in the diagnosis of influenza from Nasopharyngeal swab specimens and should not be used as a sole basis for treatment. Nasal washings and aspirates are unacceptable for Xpert Xpress SARS-CoV-2/FLU/RSV testing.  Fact Sheet for Patients: EntrepreneurPulse.com.au  Fact Sheet for Healthcare Providers: IncredibleEmployment.be  This test is not yet approved or cleared by the [place] FDA and has been authorized for detection and/or diagnosis of SARS-CoV-2 by FDA under an Emergency Use Authorization (EUA). This EUA will remain in effect (meaning this test can be used) for the duration of the COVID-19 declaration under Section 564(b)(1) of the Act, 21 U.S.C. section 360bbb-3(b)(1), unless the authorization is terminated or revoked.  Performed at [facility], [address] [place], [place] [address]   Urine Culture     Status: Abnormal   Collection Time: 02/23/21  8:39 AM   Specimen: Urine, Random  Result Value Ref Range Status   Specimen Description   Final    URINE, RANDOM Performed at [facility], [address] [place], [place] [address]    Special Requests   Final    NONE Performed at [facility], [address] [place], [place] [address]    Culture (A)  Final    >=100,000 COLONIES/mL AEROCOCCUS URINAE Standardized susceptibility testing for this organism is not available. Performed at [facility], [place] [address], [place], [place] [address]    Report Status 02/25/2021 FINAL  Final  SARS CORONAVIRUS 2 (TAT 6-24 HRS) Nasopharyngeal Nasopharyngeal Swab     Status: None   Collection Time: 02/25/21 10:45 AM   Specimen: Nasopharyngeal Swab  Result Value Ref Range Status   SARS Coronavirus 2 NEGATIVE NEGATIVE Final    Comment: (NOTE) SARS-CoV-2 target nucleic acids are NOT DETECTED.  The  SARS-CoV-2 RNA is generally detectable in upper and lower respiratory specimens during the acute phase of infection. Negative results do not preclude SARS-CoV-2 infection, do not rule out co-infections with other pathogens, and should not be used as the sole basis for treatment or other patient  management decisions. Negative results must be combined with clinical observations, patient history, and epidemiological information. The expected result is Negative.  Fact Sheet for Patients: SugarRoll.be  Fact Sheet for Healthcare Providers: https://www.woods-mathews.com/  This test is not yet approved or cleared by the [place] FDA and  has been authorized for detection and/or diagnosis of SARS-CoV-2 by FDA under an Emergency Use Authorization (EUA). This EUA will remain  in effect (meaning this test can be used) for the duration of the COVID-19 declaration under Se ction 564(b)(1) of the Act, 21 U.S.C. section 360bbb-3(b)(1), unless the authorization is terminated or revoked sooner.  Performed at [facility], [place] [address], [place], [place] [address]      Labs: BNP (last 3 results) Recent Labs    02/22/21 2357  BNP [id number]   Basic Metabolic Panel: Recent Labs  Lab 02/22/21 2357 02/24/21 0415 02/25/21 0609 02/26/21 0316  NA 139 143 139 139  K 5.0 4.1 4.5 4.5  CL 103 108 104 104  CO2 '25 25 28 29  '$ GLUCOSE 120* 84 114* 102*  BUN '15 17 15 15  '$ CREATININE 0.93 0.86 0.68 0.61  CALCIUM 10.1 8.9 8.9 8.9  MG  --  2.6* 2.6* 2.5*   Liver Function Tests: Recent Labs  Lab 02/22/21 2357 02/24/21 0415  AST 38 41  ALT 48* 52*  ALKPHOS 128* 127*  BILITOT 1.5* 1.4*  PROT 7.8 6.9  ALBUMIN 3.7 3.5   No results for input(s): LIPASE, AMYLASE in the last 168 hours. Recent Labs  Lab 02/23/21 0651  AMMONIA 13   CBC: Recent Labs  Lab 02/22/21 2357 02/24/21 0415 02/25/21 0609 02/26/21 0316  WBC 12.0* 9.6 10.1 9.4  NEUTROABS  8.8*  --   --   --   HGB 14.5 13.8 12.9 12.6  HCT 44.5 43.1 40.6 38.7  MCV 101.4* 103.4* 101.5* 100.8*  PLT 347 309 278 261   Cardiac Enzymes: No results for input(s): CKTOTAL, CKMB, CKMBINDEX, TROPONINI in the last 168 hours. BNP: Invalid input(s): POCBNP CBG: Recent Labs  Lab 02/23/21 1210  GLUCAP 108*   D-Dimer No results for input(s): DDIMER in the last 72 hours. Hgb A1c No results for input(s): HGBA1C in the last 72 hours. Lipid Profile No results for input(s): CHOL, HDL, LDLCALC, TRIG, CHOLHDL, LDLDIRECT in the last 72 hours. Thyroid function studies No results for input(s): TSH, T4TOTAL, T3FREE, THYROIDAB in the last 72 hours.  Invalid input(s): FREET3 Anemia work up No results for input(s): VITAMINB12, FOLATE, FERRITIN, TIBC, IRON, RETICCTPCT in the last 72 hours. Urinalysis    Component Value Date/Time   COLORURINE AMBER (A) 02/23/2021 0839   APPEARANCEUR CLEAR 02/23/2021 0839   APPEARANCEUR Clear 05/30/2014 1003   LABSPEC >1.030 (H) 02/23/2021 0839   LABSPEC 1.015 05/30/2014 1003   PHURINE 5.5 02/23/2021 0839   GLUCOSEU NEGATIVE 02/23/2021 0839   GLUCOSEU Negative 05/30/2014 1003   HGBUR LARGE (A) 02/23/2021 0839   BILIRUBINUR SMALL (A) 02/23/2021 0839   BILIRUBINUR Negative 05/30/2014 1003   KETONESUR TRACE (A) 02/23/2021 0839   PROTEINUR TRACE (A) 02/23/2021 0839   UROBILINOGEN 0.2 08/02/2012 0941   NITRITE NEGATIVE 02/23/2021 0839   LEUKOCYTESUR SMALL (A) 02/23/2021 0839   LEUKOCYTESUR 1+ 05/30/2014 1003   Sepsis Labs Invalid input(s): PROCALCITONIN,  WBC,  LACTICIDVEN Microbiology Recent Results (from the past 240 hour(s))  Resp Panel by RT-PCR (Flu A&B, Covid) Nasopharyngeal Swab     Status: None   Collection Time: 02/22/21 11:57 PM  Specimen: Nasopharyngeal Swab; Nasopharyngeal(NP) swabs in vial transport medium  Result Value Ref Range Status   SARS Coronavirus 2 by RT PCR NEGATIVE NEGATIVE Final    Comment: (NOTE) SARS-CoV-2 target nucleic  acids are NOT DETECTED.  The SARS-CoV-2 RNA is generally detectable in upper respiratory specimens during the acute phase of infection. The lowest concentration of SARS-CoV-2 viral copies this assay can detect is 138 copies/mL. A negative result does not preclude SARS-Cov-2 infection and should not be used as the sole basis for treatment or other patient management decisions. A negative result may occur with  improper specimen collection/handling, submission of specimen other than nasopharyngeal swab, presence of viral mutation(s) within the areas targeted by this assay, and inadequate number of viral copies(<138 copies/mL). A negative result must be combined with clinical observations, patient history, and epidemiological information. The expected result is Negative.  Fact Sheet for Patients:  EntrepreneurPulse.com.au  Fact Sheet for Healthcare Providers:  IncredibleEmployment.be  This test is no t yet approved or cleared by the [place] FDA and  has been authorized for detection and/or diagnosis of SARS-CoV-2 by FDA under an Emergency Use Authorization (EUA). This EUA will remain  in effect (meaning this test can be used) for the duration of the COVID-19 declaration under Section 564(b)(1) of the Act, 21 U.S.C.section 360bbb-3(b)(1), unless the authorization is terminated  or revoked sooner.       Influenza A by PCR NEGATIVE NEGATIVE Final   Influenza B by PCR NEGATIVE NEGATIVE Final    Comment: (NOTE) The Xpert Xpress SARS-CoV-2/FLU/RSV plus assay is intended as an aid in the diagnosis of influenza from Nasopharyngeal swab specimens and should not be used as a sole basis for treatment. Nasal washings and aspirates are unacceptable for Xpert Xpress SARS-CoV-2/FLU/RSV testing.  Fact Sheet for Patients: EntrepreneurPulse.com.au  Fact Sheet for Healthcare Providers: IncredibleEmployment.be  This  test is not yet approved or cleared by the [place] FDA and has been authorized for detection and/or diagnosis of SARS-CoV-2 by FDA under an Emergency Use Authorization (EUA). This EUA will remain in effect (meaning this test can be used) for the duration of the COVID-19 declaration under Section 564(b)(1) of the Act, 21 U.S.C. section 360bbb-3(b)(1), unless the authorization is terminated or revoked.  Performed at [facility], [address], [place], [place] [address]   Urine Culture     Status: Abnormal   Collection Time: 02/23/21  8:39 AM   Specimen: Urine, Random  Result Value Ref Range Status   Specimen Description   Final    URINE, RANDOM Performed at [facility], [address] [place], [place] [address]    Special Requests   Final    NONE Performed at [facility], [address] [place], [place] [address]    Culture (A)  Final    >=100,000 COLONIES/mL AEROCOCCUS URINAE Standardized susceptibility testing for this organism is not available. Performed at [facility], [place] [address], [place], [place] [address]    Report Status 02/25/2021 FINAL  Final  SARS CORONAVIRUS 2 (TAT 6-24 HRS) Nasopharyngeal Nasopharyngeal Swab     Status: None   Collection Time: 02/25/21 10:45 AM   Specimen: Nasopharyngeal Swab  Result Value Ref Range Status   SARS Coronavirus 2 NEGATIVE NEGATIVE Final    Comment: (NOTE) SARS-CoV-2 target nucleic acids are NOT DETECTED.  The SARS-CoV-2 RNA is generally detectable in upper and lower respiratory specimens during the acute phase of infection. Negative results do not preclude SARS-CoV-2  infection, do not rule out co-infections with other pathogens, and should not be used as the sole basis for treatment or other patient management decisions. Negative results must be combined with clinical observations, patient history, and epidemiological information. The expected result is Negative.  Fact Sheet  for Patients: SugarRoll.be  Fact Sheet for Healthcare Providers: https://www.woods-mathews.com/  This test is not yet approved or cleared by the [place] FDA and  has been authorized for detection and/or diagnosis of SARS-CoV-2 by FDA under an Emergency Use Authorization (EUA). This EUA will remain  in effect (meaning this test can be used) for the duration of the COVID-19 declaration under Se ction 564(b)(1) of the Act, 21 U.S.C. section 360bbb-3(b)(1), unless the authorization is terminated or revoked sooner.  Performed at [facility], [place] [address] [place], [place] [address]    Time spent: 30 min  SIGNED:   Marylu Lund, MD  [facility] 02/26/2021, 12:37 PM  If 7PM-7AM, please contact night-coverage

## 2021-02-26 NOTE — Plan of Care (Signed)
  Problem: Clinical Measurements: Goal: Ability to maintain clinical measurements within normal limits will improve Outcome: Progressing Goal: Will remain free from infection Outcome: Progressing Goal: Diagnostic test results will improve Outcome: Progressing Goal: Respiratory complications will improve Outcome: Progressing   Problem: Pain Managment: Goal: General experience of comfort will improve Outcome: Progressing   Pt is involved in and agrees with the plan of care. Alert and oriented x 4. V/S stable. Denies any pain.

## 2021-02-28 DIAGNOSIS — N390 Urinary tract infection, site not specified: Secondary | ICD-10-CM | POA: Insufficient documentation

## 2021-03-06 DIAGNOSIS — R1030 Lower abdominal pain, unspecified: Secondary | ICD-10-CM | POA: Insufficient documentation

## 2021-03-27 ENCOUNTER — Ambulatory Visit: Payer: Self-pay | Admitting: Urology
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
## 2021-04-01 LAB — BLOOD GAS, VENOUS
Acid-Base Excess: 7.5 mmol/L — ABNORMAL HIGH (ref 0.0–2.0)
Bicarbonate: 33.7 mmol/L — ABNORMAL HIGH (ref 20.0–28.0)
O2 Saturation: 83.4 %
Patient temperature: 37
pCO2, Ven: 52 mmHg (ref 44.0–60.0)
pH, Ven: 7.42 (ref 7.250–7.430)
pO2, Ven: 47 mmHg — ABNORMAL HIGH (ref 32.0–45.0)

## 2021-04-22 DIAGNOSIS — M1990 Unspecified osteoarthritis, unspecified site: Secondary | ICD-10-CM | POA: Insufficient documentation
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
## 2021-05-06 ENCOUNTER — Ambulatory Visit: Payer: Self-pay | Admitting: Urology

## 2021-06-06 ENCOUNTER — Other Ambulatory Visit: Payer: Self-pay | Admitting: Orthopedic Surgery

## 2021-06-06 DIAGNOSIS — M545 Low back pain, unspecified: Secondary | ICD-10-CM

## 2021-06-06 DIAGNOSIS — S32030A Wedge compression fracture of third lumbar vertebra, initial encounter for closed fracture: Secondary | ICD-10-CM

## 2021-06-09 ENCOUNTER — Ambulatory Visit: Admission: RE | Admit: 2021-06-09 | Payer: Medicare Other | Source: Ambulatory Visit

## 2021-06-09 ENCOUNTER — Ambulatory Visit: Payer: Medicare Other

## 2021-06-24 ENCOUNTER — Ambulatory Visit
Admission: RE | Admit: 2021-06-24 | Discharge: 2021-06-24 | Disposition: A | Discharge status: Home / Self Care | Payer: Medicare Other | Source: Ambulatory Visit | Attending: Orthopedic Surgery | Admitting: Orthopedic Surgery

## 2021-06-24 ENCOUNTER — Other Ambulatory Visit: Payer: Self-pay

## 2021-06-24 DIAGNOSIS — M545 Low back pain, unspecified: Secondary | ICD-10-CM

## 2021-06-24 DIAGNOSIS — S32030A Wedge compression fracture of third lumbar vertebra, initial encounter for closed fracture: Secondary | ICD-10-CM | POA: Diagnosis present
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
## 2021-07-09 ENCOUNTER — Other Ambulatory Visit: Payer: Self-pay

## 2021-07-09 ENCOUNTER — Ambulatory Visit (INDEPENDENT_AMBULATORY_CARE_PROVIDER_SITE_OTHER): Payer: Medicare Other | Admitting: Urology

## 2021-07-09 VITALS — BP 113/74 | HR 52 | Ht 65.5 in | Wt 273.0 lb

## 2021-07-09 DIAGNOSIS — N390 Urinary tract infection, site not specified: Secondary | ICD-10-CM | POA: Diagnosis not present

## 2021-07-09 NOTE — Patient Instructions (Signed)
Cranberry tablets as directed.D-Mannose as directed

## 2021-07-10 ENCOUNTER — Other Ambulatory Visit: Payer: Self-pay

## 2021-07-11 ENCOUNTER — Other Ambulatory Visit: Payer: Self-pay | Admitting: Family Medicine

## 2021-07-11 DIAGNOSIS — R1900 Intra-abdominal and pelvic swelling, mass and lump, unspecified site: Secondary | ICD-10-CM

## 2021-07-15 ENCOUNTER — Ambulatory Visit
Admission: RE | Admit: 2021-07-15 | Discharge: 2021-07-15 | Disposition: A | Discharge status: Home / Self Care | Payer: Medicare Other | Source: Ambulatory Visit | Attending: Family Medicine | Admitting: Family Medicine

## 2021-07-15 DIAGNOSIS — R1900 Intra-abdominal and pelvic swelling, mass and lump, unspecified site: Secondary | ICD-10-CM | POA: Insufficient documentation

## 2021-07-31 ENCOUNTER — Encounter: Payer: Self-pay | Admitting: *Deleted

## 2021-08-06 ENCOUNTER — Inpatient Hospital Stay: Payer: Medicare Other

## 2021-08-06 ENCOUNTER — Other Ambulatory Visit: Payer: Self-pay

## 2021-08-06 ENCOUNTER — Inpatient Hospital Stay: Payer: Medicare Other | Attending: Obstetrics and Gynecology | Admitting: Obstetrics and Gynecology

## 2021-08-06 VITALS — BP 126/58 | HR 55 | Temp 95.4°F | Resp 18

## 2021-08-06 DIAGNOSIS — Z6841 Body Mass Index (BMI) 40.0 and over, adult: Secondary | ICD-10-CM | POA: Insufficient documentation

## 2021-08-06 DIAGNOSIS — R1033 Periumbilical pain: Secondary | ICD-10-CM | POA: Insufficient documentation

## 2021-08-06 DIAGNOSIS — M810 Age-related osteoporosis without current pathological fracture: Secondary | ICD-10-CM | POA: Insufficient documentation

## 2021-08-06 DIAGNOSIS — Z87440 Personal history of urinary (tract) infections: Secondary | ICD-10-CM | POA: Diagnosis not present

## 2021-08-06 DIAGNOSIS — K219 Gastro-esophageal reflux disease without esophagitis: Secondary | ICD-10-CM | POA: Insufficient documentation

## 2021-08-06 DIAGNOSIS — R350 Frequency of micturition: Secondary | ICD-10-CM | POA: Insufficient documentation

## 2021-08-06 DIAGNOSIS — I739 Peripheral vascular disease, unspecified: Secondary | ICD-10-CM | POA: Diagnosis not present

## 2021-08-06 DIAGNOSIS — I4891 Unspecified atrial fibrillation: Secondary | ICD-10-CM | POA: Diagnosis not present

## 2021-08-06 DIAGNOSIS — Z8719 Personal history of other diseases of the digestive system: Secondary | ICD-10-CM | POA: Diagnosis not present

## 2021-08-06 DIAGNOSIS — N9489 Other specified conditions associated with female genital organs and menstrual cycle: Secondary | ICD-10-CM

## 2021-08-06 DIAGNOSIS — I872 Venous insufficiency (chronic) (peripheral): Secondary | ICD-10-CM | POA: Insufficient documentation

## 2021-08-06 DIAGNOSIS — Z7189 Other specified counseling: Secondary | ICD-10-CM | POA: Diagnosis not present

## 2021-08-06 DIAGNOSIS — I110 Hypertensive heart disease with heart failure: Secondary | ICD-10-CM | POA: Insufficient documentation

## 2021-08-06 DIAGNOSIS — Q2730 Arteriovenous malformation, site unspecified: Secondary | ICD-10-CM | POA: Diagnosis not present

## 2021-08-06 DIAGNOSIS — M1990 Unspecified osteoarthritis, unspecified site: Secondary | ICD-10-CM | POA: Diagnosis not present

## 2021-08-06 DIAGNOSIS — I5032 Chronic diastolic (congestive) heart failure: Secondary | ICD-10-CM | POA: Diagnosis not present

## 2021-08-06 DIAGNOSIS — D3911 Neoplasm of uncertain behavior of right ovary: Secondary | ICD-10-CM | POA: Diagnosis present

## 2021-08-06 DIAGNOSIS — J45901 Unspecified asthma with (acute) exacerbation: Secondary | ICD-10-CM | POA: Insufficient documentation

## 2021-08-06 DIAGNOSIS — Z79899 Other long term (current) drug therapy: Secondary | ICD-10-CM | POA: Diagnosis not present

## 2021-08-06 DIAGNOSIS — Z7901 Long term (current) use of anticoagulants: Secondary | ICD-10-CM | POA: Insufficient documentation

## 2021-08-06 DIAGNOSIS — Z85828 Personal history of other malignant neoplasm of skin: Secondary | ICD-10-CM | POA: Diagnosis not present

## 2021-08-06 DIAGNOSIS — R300 Dysuria: Secondary | ICD-10-CM | POA: Diagnosis not present

## 2021-08-06 LAB — CREATININE, SERUM
Creatinine, Ser: 0.8 mg/dL (ref 0.44–1.00)
GFR, Estimated: >60 mL/min (ref >60)
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
## 2021-08-19 ENCOUNTER — Other Ambulatory Visit: Payer: Self-pay

## 2021-08-19 ENCOUNTER — Ambulatory Visit
Admission: RE | Admit: 2021-08-19 | Discharge: 2021-08-19 | Disposition: A | Discharge status: Home / Self Care | Payer: Medicare Other | Source: Ambulatory Visit | Attending: Obstetrics and Gynecology | Admitting: Obstetrics and Gynecology

## 2021-08-19 DIAGNOSIS — N9489 Other specified conditions associated with female genital organs and menstrual cycle: Secondary | ICD-10-CM | POA: Diagnosis not present

## 2021-08-19 MED ORDER — IOHEXOL 300 MG/ML  SOLN
100.0000 mL | Freq: Once | INTRAMUSCULAR | Status: AC | PRN
  Administered 2021-08-19: 100 mL via INTRAVENOUS

## 2021-08-20 ENCOUNTER — Telehealth: Payer: Self-pay | Admitting: *Deleted

## 2021-08-20 NOTE — Telephone Encounter (Signed)
Called report ? ?IMPRESSION: ?1. Large, complex cystic right adnexal mass, which corresponds to ?the lesion seen on the prior pelvic ultrasound. This is concerning ?for low-grade cystic neoplasm. Recommend GYN consult, and consider ?pelvis MRI w/o and w/ contrast if clinically warranted. Note: This ?recommendation does not apply to premenarchal patients and to those ?with increased risk (genetic, family history, elevated tumor markers ?or other high-risk factors) of ovarian cancer. Reference: JACR 2020 ?Feb; 17(2):248-254 ?2. 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 Correlation with pancreatic protocol CT or ?MRI is recommended. ?3. Cholelithiasis. ?4. Colonic diverticulosis. ?5. Nonobstructing right renal calculi. ?6. Chronic fracture deformity of the superior endplate of the L3 ?vertebral body. ?7. Buttock atherosclerosis. ?  ?Aortic Atherosclerosis (ICD10-I70.0). ?  ?  ?Electronically Signed ?  By: Virgina Norfolk M.D. ?  On: 08/20/2021 16:04 ?

## 2021-08-27 ENCOUNTER — Other Ambulatory Visit: Payer: Self-pay

## 2021-08-27 ENCOUNTER — Inpatient Hospital Stay: Payer: Medicare Other | Attending: Obstetrics and Gynecology | Admitting: Nurse Practitioner

## 2021-08-27 VITALS — BP 121/73 | HR 62 | Temp 98.2°F | Resp 20

## 2021-08-27 DIAGNOSIS — R1033 Periumbilical pain: Secondary | ICD-10-CM

## 2021-08-27 DIAGNOSIS — K862 Cyst of pancreas: Secondary | ICD-10-CM | POA: Diagnosis not present

## 2021-08-27 DIAGNOSIS — I5032 Chronic diastolic (congestive) heart failure: Secondary | ICD-10-CM | POA: Diagnosis not present

## 2021-08-27 DIAGNOSIS — I4891 Unspecified atrial fibrillation: Secondary | ICD-10-CM | POA: Diagnosis not present

## 2021-08-27 DIAGNOSIS — N9489 Other specified conditions associated with female genital organs and menstrual cycle: Secondary | ICD-10-CM | POA: Diagnosis not present

## 2021-08-27 DIAGNOSIS — Z7901 Long term (current) use of anticoagulants: Secondary | ICD-10-CM | POA: Insufficient documentation

## 2021-08-27 DIAGNOSIS — I110 Hypertensive heart disease with heart failure: Secondary | ICD-10-CM | POA: Insufficient documentation

## 2021-08-27 DIAGNOSIS — N839 Noninflammatory disorder of ovary, fallopian tube and broad ligament, unspecified: Secondary | ICD-10-CM | POA: Diagnosis not present
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
## 2021-09-01 ENCOUNTER — Ambulatory Visit (INDEPENDENT_AMBULATORY_CARE_PROVIDER_SITE_OTHER): Payer: Medicare Other | Admitting: Surgery

## 2021-09-01 ENCOUNTER — Encounter: Payer: Self-pay | Admitting: Surgery

## 2021-09-01 ENCOUNTER — Other Ambulatory Visit: Payer: Self-pay

## 2021-09-01 VITALS — BP 117/74 | HR 61 | Temp 98.2°F | Ht 65.5 in | Wt 264.0 lb

## 2021-09-01 DIAGNOSIS — R1033 Periumbilical pain: Secondary | ICD-10-CM

## 2021-09-01 NOTE — Patient Instructions (Addendum)
Please call the office if you have any questions or concerns. ? ?You may purchase an abdominal binder. This can be purchased at the drug store or medical supply store. You do not need a prescription to purchase this.  ?
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
## 2021-09-04 IMAGING — CR DG CHEST 2V
1 series · 2 of 2 positions shown · non-contrast
Comparison: 02/18/2020

CLINICAL DATA: Fall with pain and swelling

EXAM:
CHEST - 2 VIEW

[Series 1: dg chest 2 view · 0.14mm/px · 2 of 2 slices shown]
[im 1/2]
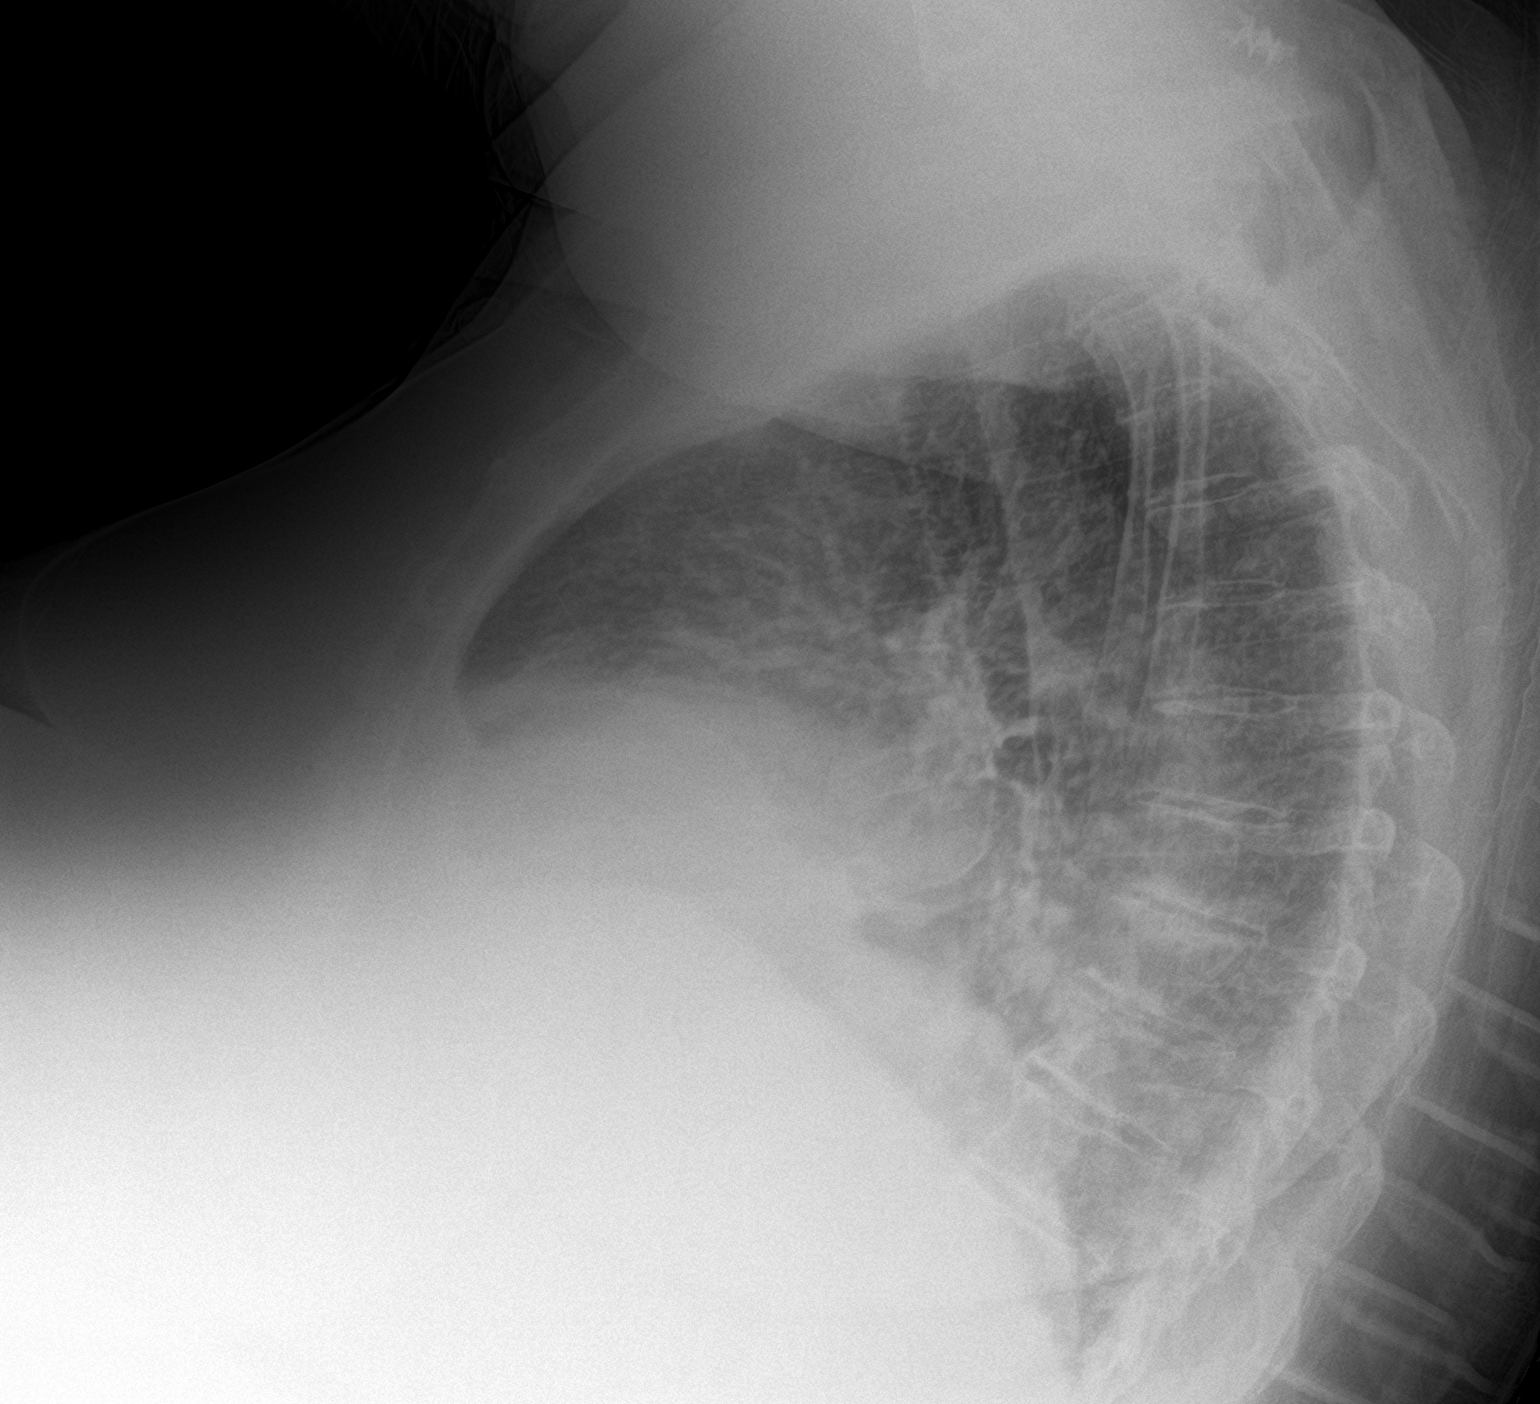
[im 2/2]
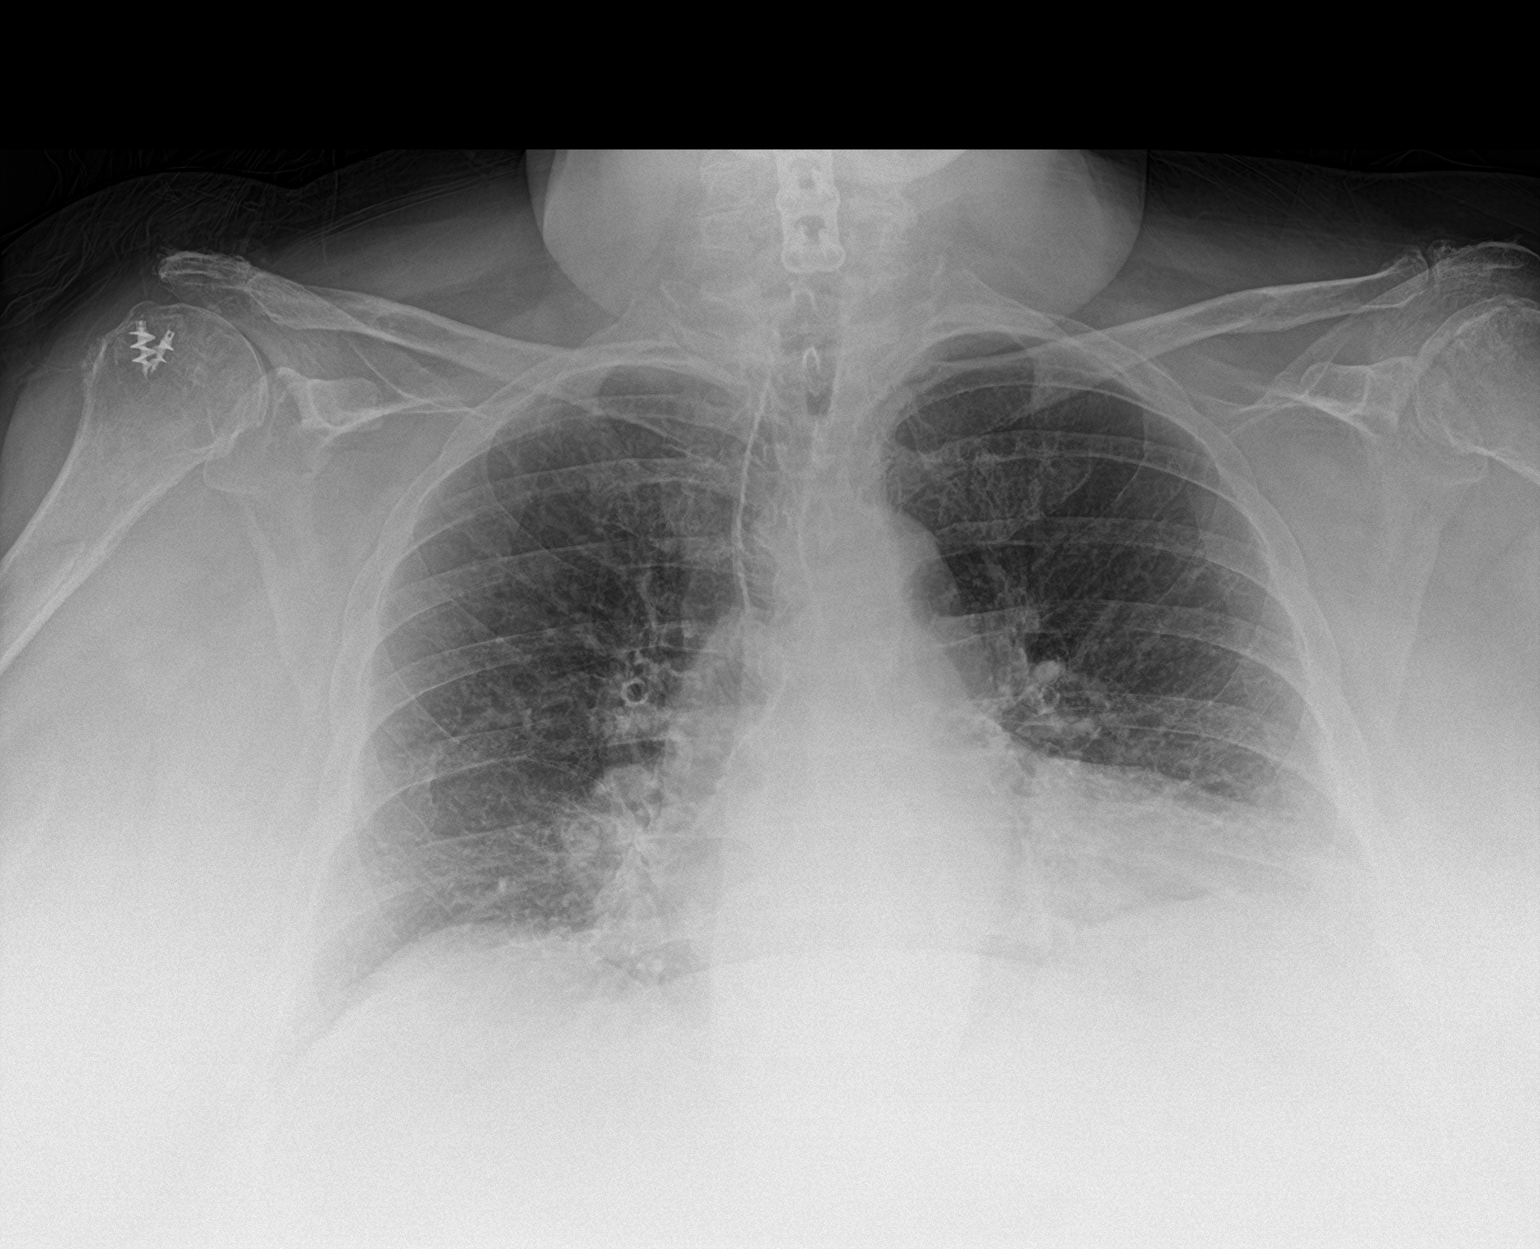

[2 of 2 positions shown; findings below may reference images not displayed]

FINDINGS: Surgical hardware in the cervical spine. Low lung volumes.
Postsurgical changes at the right humeral head. Cardiomegaly without
focal opacity, pleural effusion, or pneumothorax.
IMPRESSION: No active cardiopulmonary disease. Low lung volumes with
cardiomegaly.

## 2021-09-04 IMAGING — CT CT HEAD W/O CM
3 series · 16 of 47 positions shown, 19 images · non-contrast
Comparison: [id number]

CLINICAL DATA: Head trauma, minor

EXAM:
CT HEAD WITHOUT CONTRAST
TECHNIQUE: Contiguous axial images were obtained from the base of the skull
through the vertex without intravenous contrast.

[Series 3: head wo · axial · 0.41mm/px · z∈[-94,+41]mm · 10 of 33 slices shown, 13 images]
[im 3/33  brain]
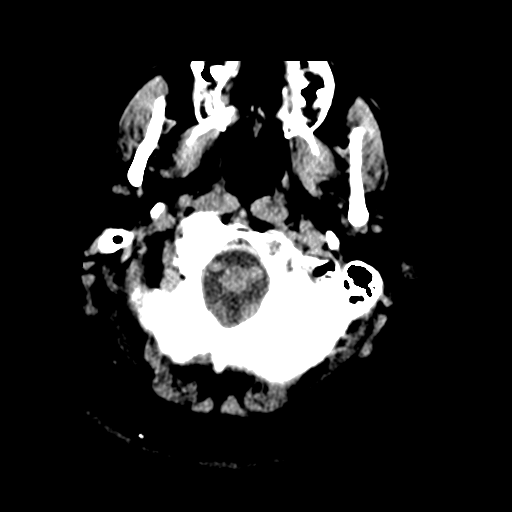
[im 3/33  bone]
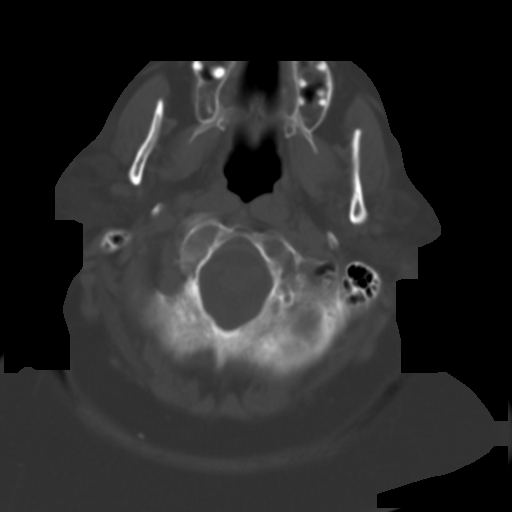
[im 6/33  brain]
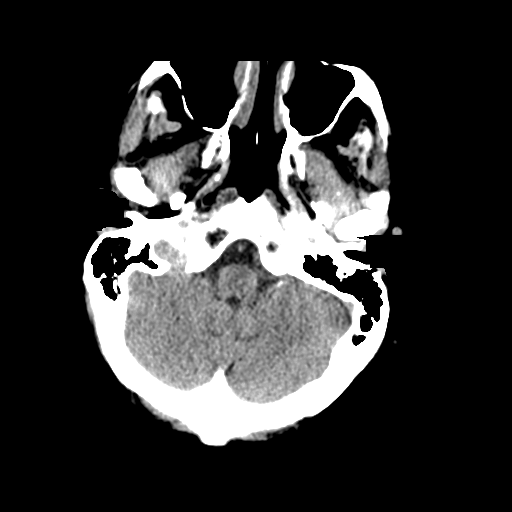
[im 9/33  brain]
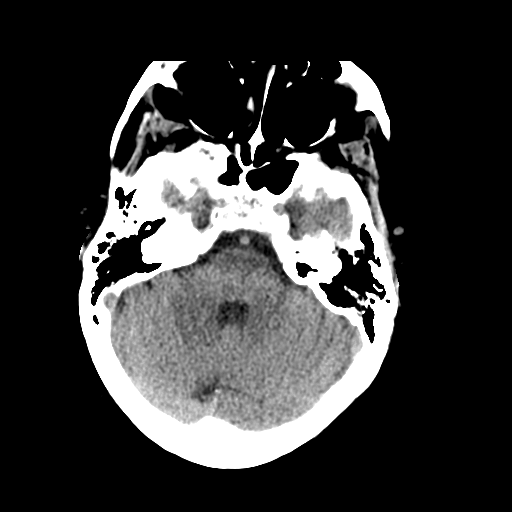
[im 12/33  brain]
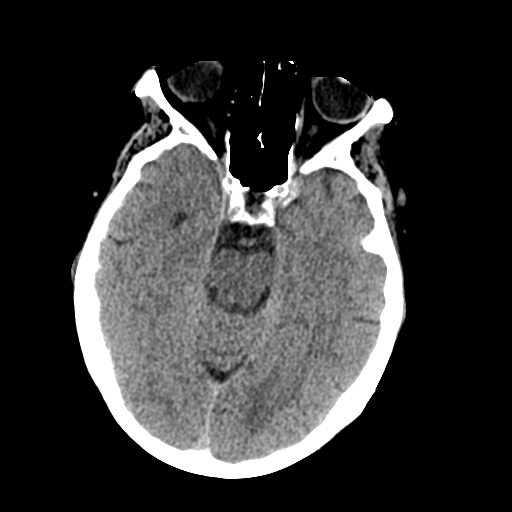
[im 15/33  brain]
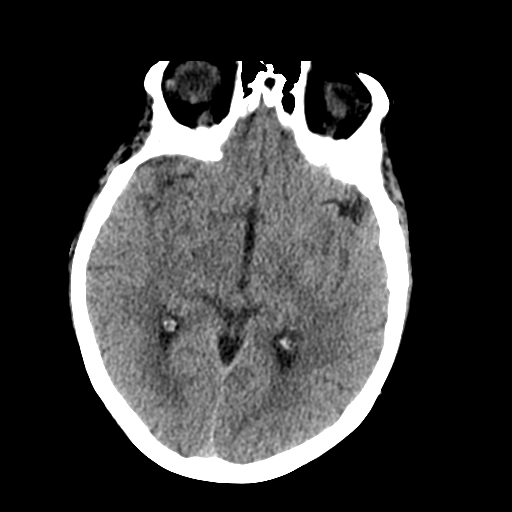
[im 15/33  bone]
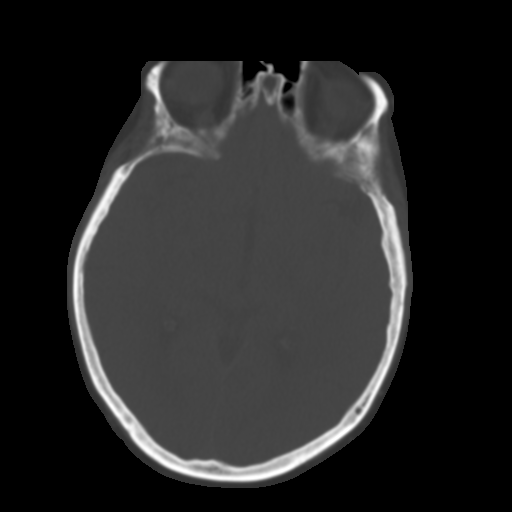
[im 18/33  brain]
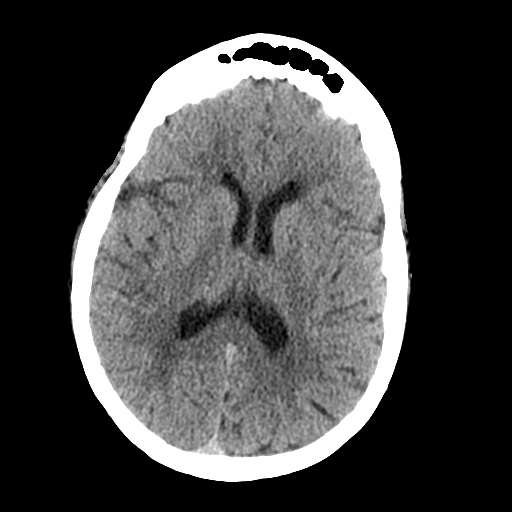
[im 21/33  brain]
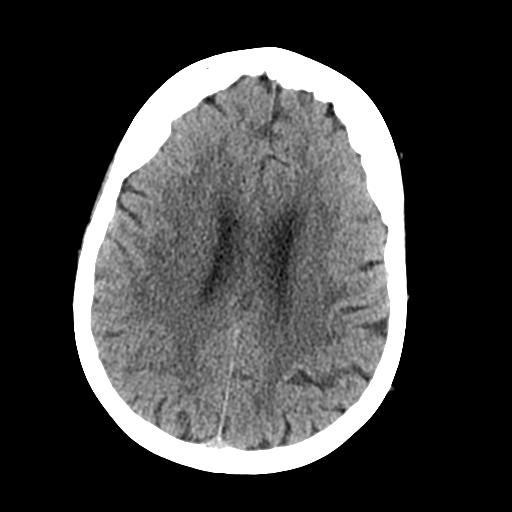
[im 25/33  brain]
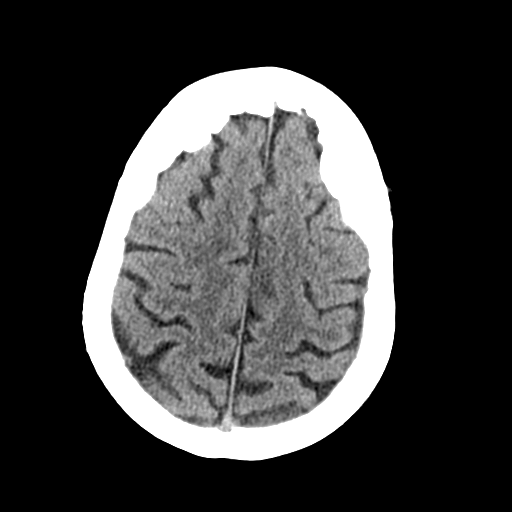
[im 27/33  brain]
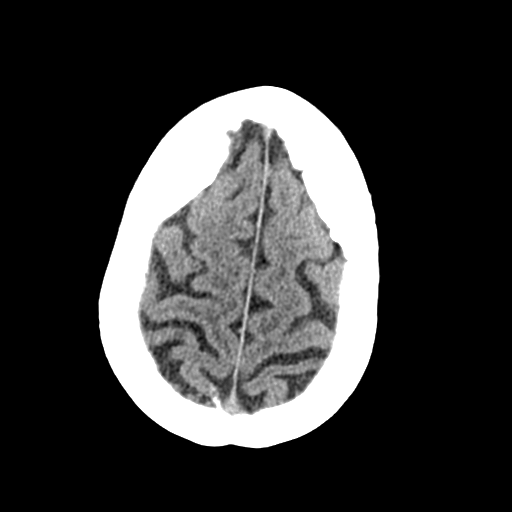
[im 27/33  bone]
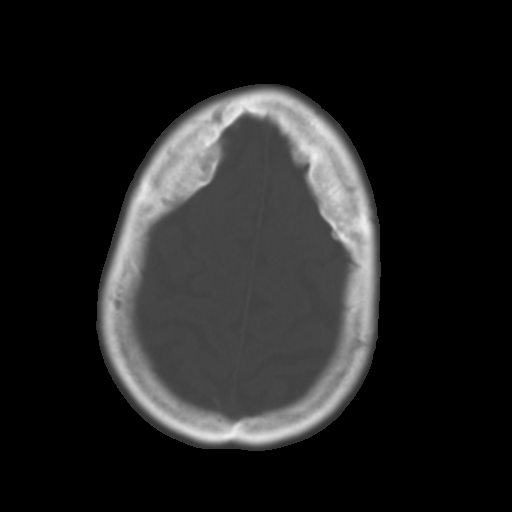
[im 30/33  brain]
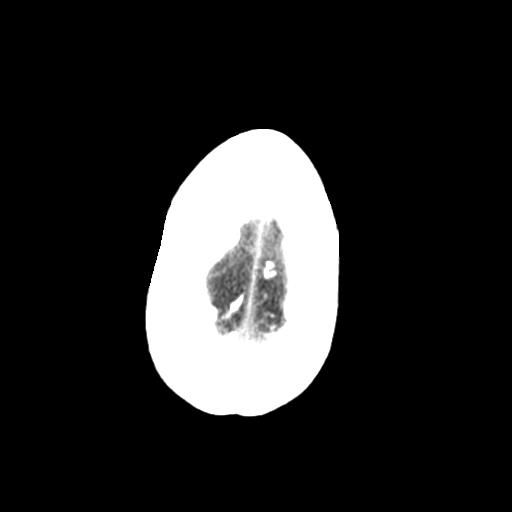

[Series 4: coronal soft tissue · coronal · 0.35mm/px · 3 of 66 slices shown]
[im 22/66  brain]
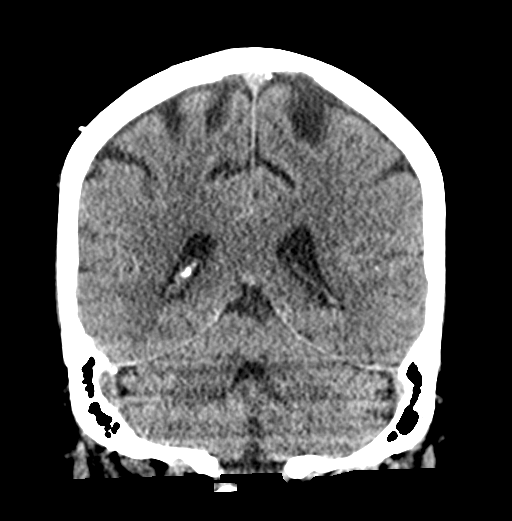
[im 29/66  brain]
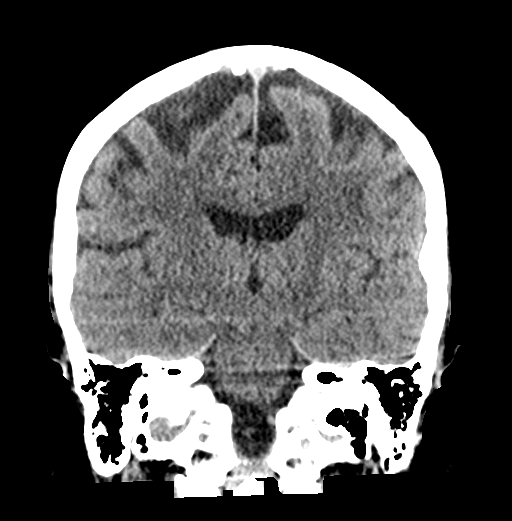
[im 37/66  brain]
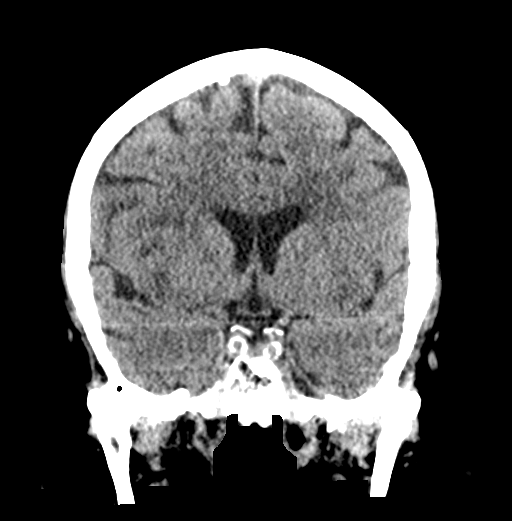

[Series 5: sagittal soft tissue · sagittal · 0.36mm/px · 3 of 52 slices shown]
[im 18/52  brain]
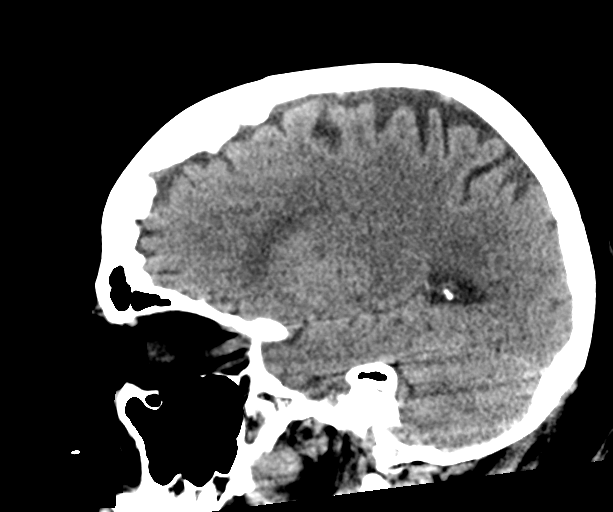
[im 26/52  brain]
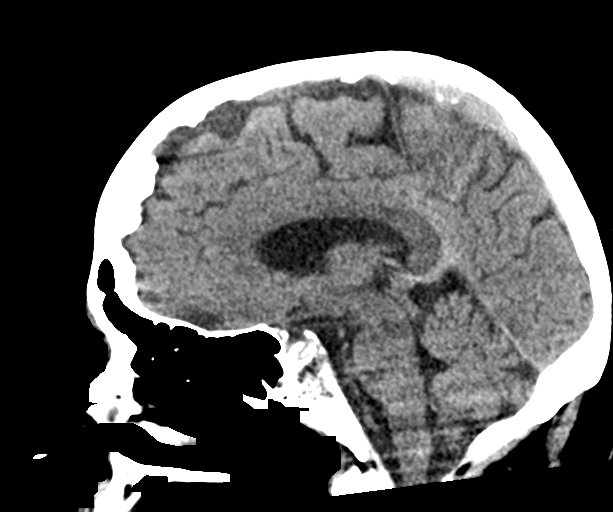
[im 35/52  brain]
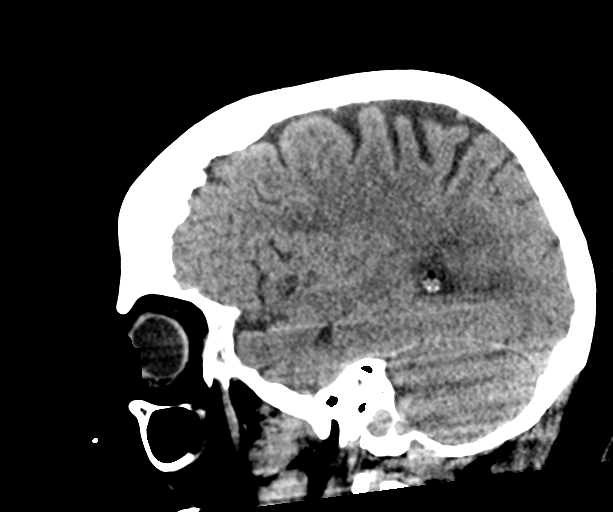

[16 of 47 positions shown; findings below may reference images not displayed]

FINDINGS: Brain: There is no acute intracranial hemorrhage, mass effect, or
edema. Gray-white differentiation is preserved. There is no
extra-axial fluid collection. Ventricles and sulci are within normal
limits in size and configuration. Patchy low-attenuation in the
supratentorial white matter is nonspecific but may reflect mild
chronic microvascular ischemic changes.

Vascular: There is atherosclerotic calcification at the skull base.

Skull: Calvarium is unremarkable.

Sinuses/Orbits: No acute finding.

Other: None.
IMPRESSION: No evidence of acute intracranial injury.

## 2021-09-04 IMAGING — CR DG LUMBAR SPINE 2-3V
1 series · 3 of 3 positions shown · non-contrast
Comparison: 02/07/2021

CLINICAL DATA: Fall with pain

EXAM:
LUMBAR SPINE - 2-3 VIEW

[Series 1: dg lumbar spine 2-3 views · 0.14mm/px · 3 of 3 slices shown]
[im 1/3]
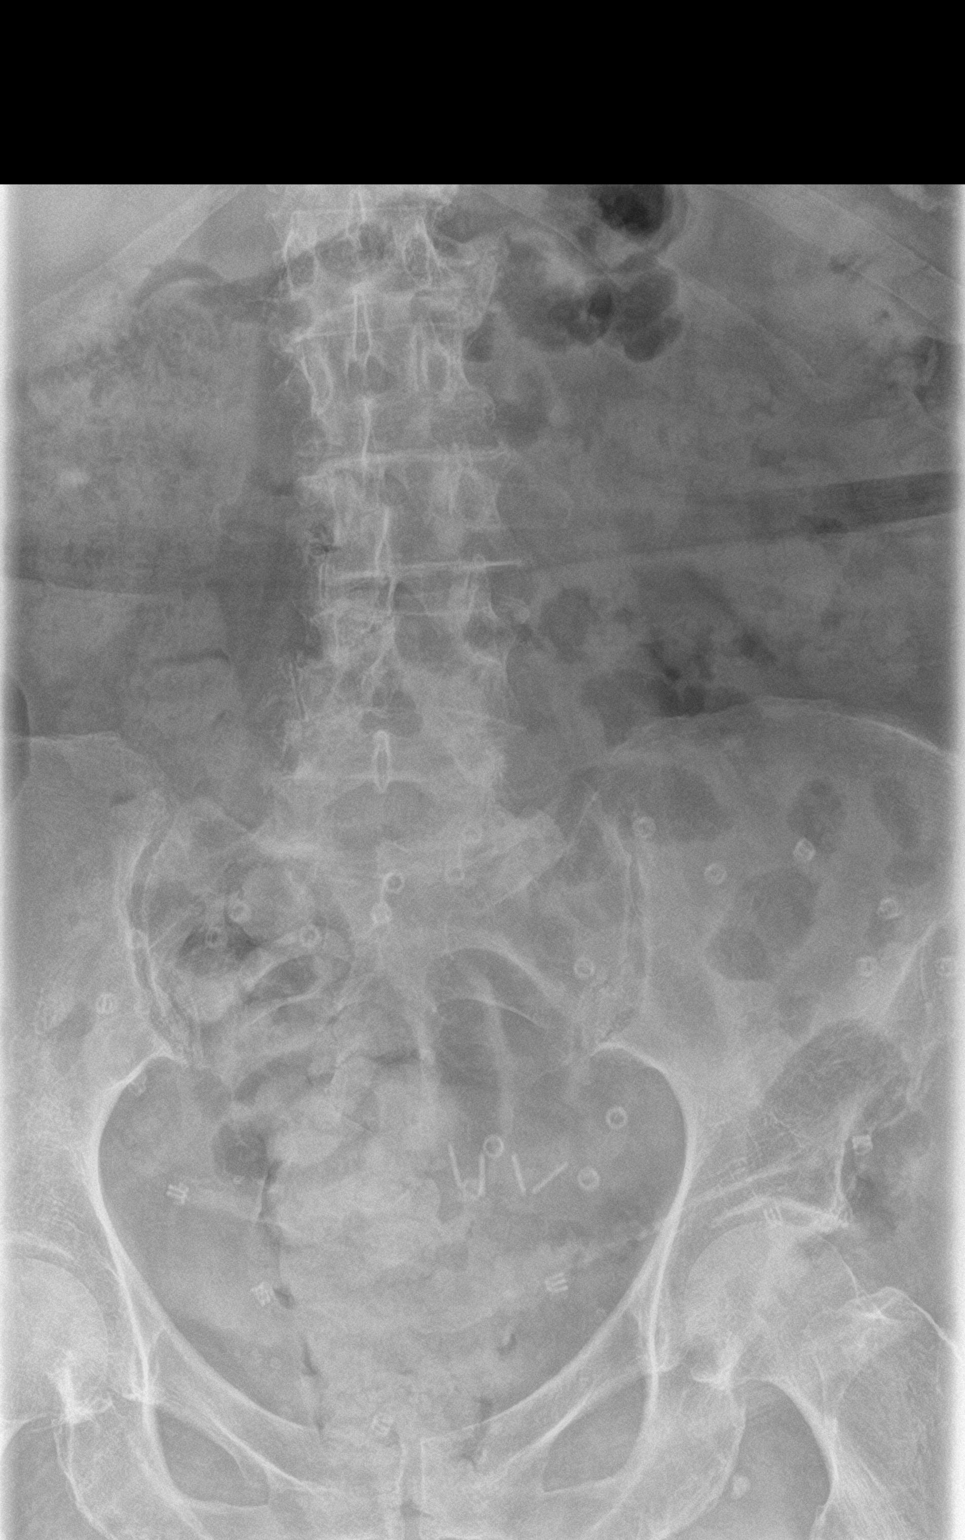
[im 2/3]
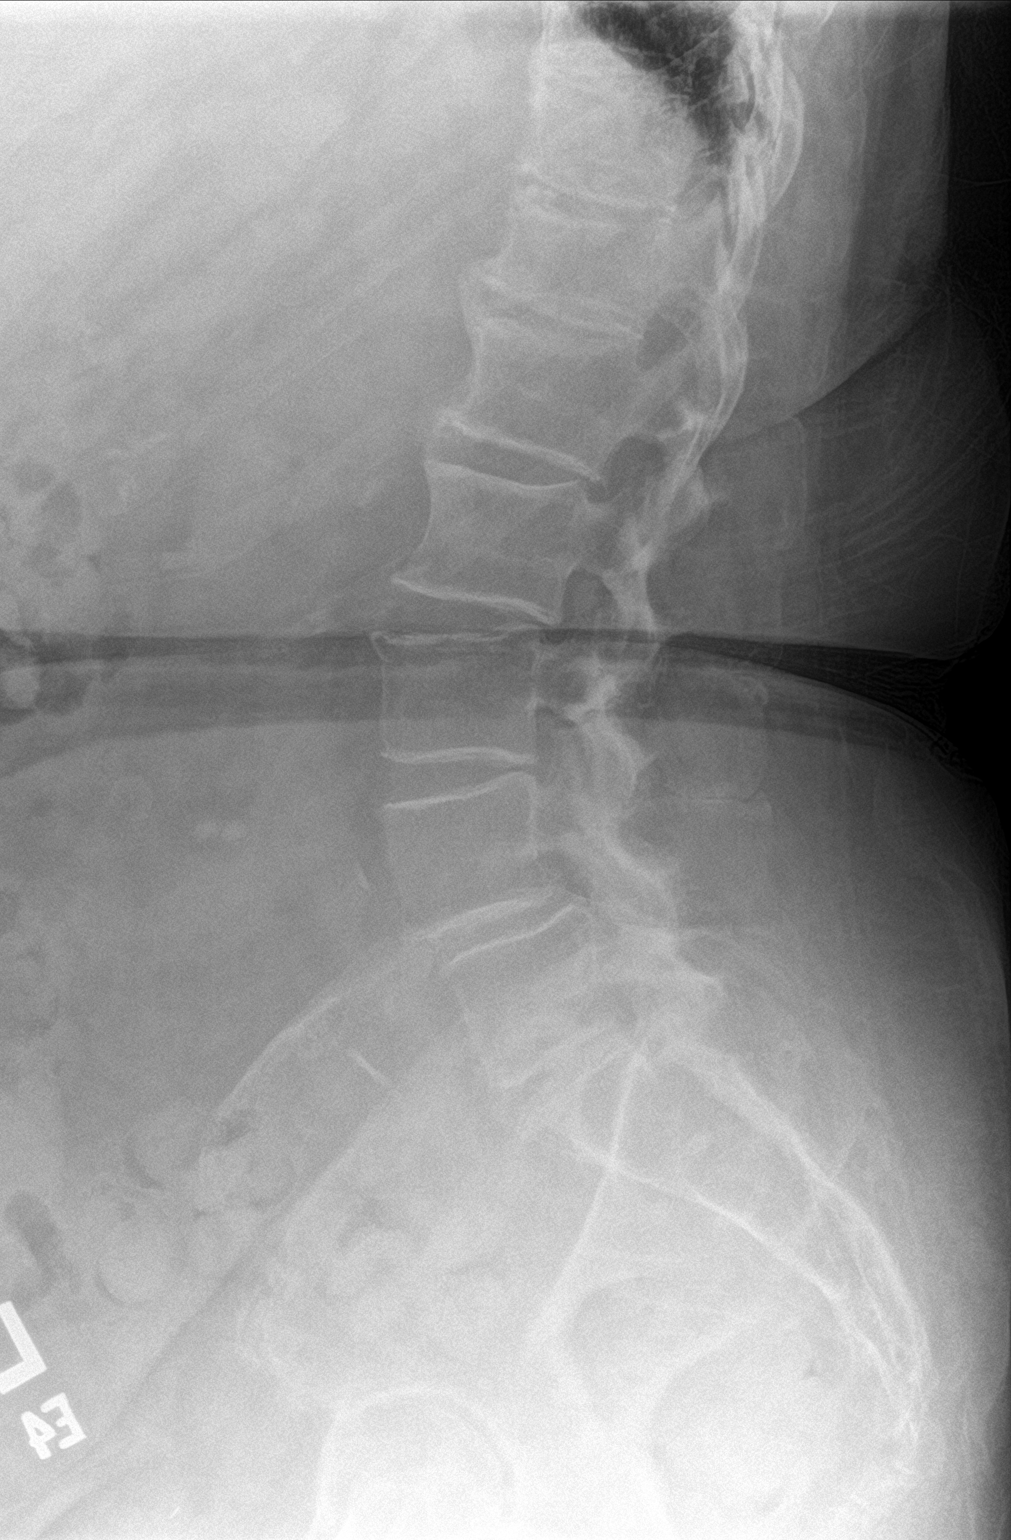
[im 3/3]
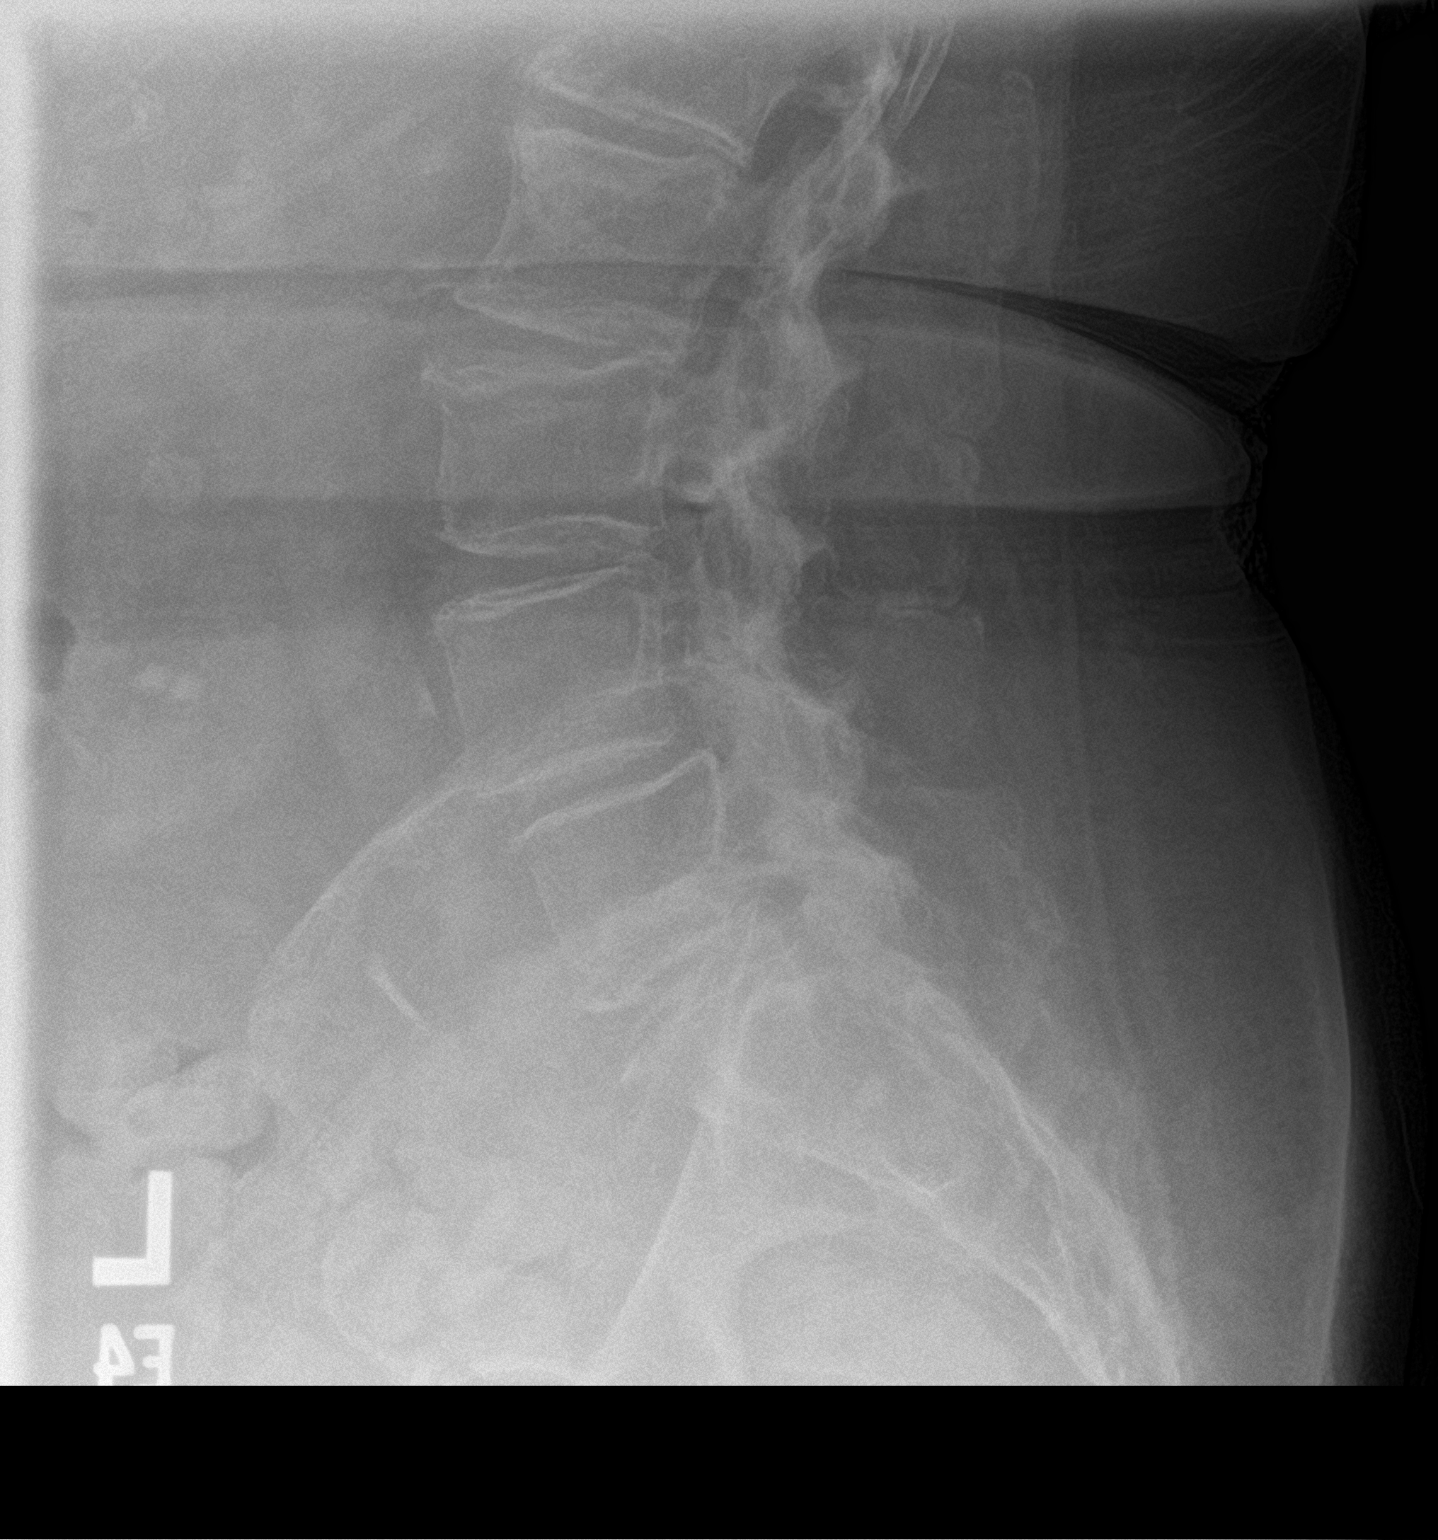

[3 of 3 positions shown; findings below may reference images not displayed]

FINDINGS: Mild scoliosis. Evidence of prior hernia repair. Stable grade 1
anterolisthesis L4 on L5. Trace retrolisthesis L2 on L3. Vertebral
body heights are grossly maintained. Diffuse degenerative changes
with mild to moderate disc space narrowing at L4-L5 and L5-S1. Facet
degenerative changes at multiple levels. Right kidney stone.
IMPRESSION: 1. Scoliosis and degenerative changes.  No acute osseous abnormality
2. Right kidney stone

## 2021-09-10 ENCOUNTER — Ambulatory Visit
Admission: RE | Admit: 2021-09-10 | Discharge: 2021-09-10 | Disposition: A | Discharge status: Home / Self Care | Payer: Medicare Other | Source: Ambulatory Visit | Attending: Nurse Practitioner | Admitting: Nurse Practitioner

## 2021-09-10 ENCOUNTER — Other Ambulatory Visit: Payer: Self-pay

## 2021-09-10 DIAGNOSIS — K862 Cyst of pancreas: Secondary | ICD-10-CM | POA: Insufficient documentation

## 2021-09-10 MED ORDER — IOHEXOL 300 MG/ML  SOLN
100.0000 mL | Freq: Once | INTRAMUSCULAR | Status: AC | PRN
  Administered 2021-09-10: 100 mL via INTRAVENOUS

## 2021-10-16 ENCOUNTER — Ambulatory Visit: Payer: Medicare Other | Admitting: Physician Assistant

## 2021-10-17 DIAGNOSIS — Z789 Other specified health status: Secondary | ICD-10-CM | POA: Insufficient documentation

## 2021-10-21 ENCOUNTER — Encounter: Payer: Self-pay | Admitting: Physician Assistant

## 2021-10-21 ENCOUNTER — Ambulatory Visit (INDEPENDENT_AMBULATORY_CARE_PROVIDER_SITE_OTHER): Payer: Medicare Other | Admitting: Physician Assistant

## 2021-10-21 VITALS — BP 140/78 | HR 84 | Ht 65.0 in | Wt 264.0 lb

## 2021-10-21 DIAGNOSIS — R310 Gross hematuria: Secondary | ICD-10-CM

## 2021-10-21 DIAGNOSIS — R8281 Pyuria: Secondary | ICD-10-CM

## 2021-10-21 DIAGNOSIS — R8271 Bacteriuria: Secondary | ICD-10-CM | POA: Diagnosis not present

## 2021-10-21 DIAGNOSIS — N135 Crossing vessel and stricture of ureter without hydronephrosis: Secondary | ICD-10-CM

## 2021-10-21 LAB — URINALYSIS, COMPLETE
Bilirubin, UA: NEGATIVE
Glucose, UA: NEGATIVE
Ketones, UA: NEGATIVE
Nitrite, UA: POSITIVE — AB
Specific Gravity, UA: 1.02 (ref 1.005–1.030)
Urobilinogen, Ur: 0.2 mg/dL (ref 0.2–1.0)
pH, UA: 7 (ref 5.0–7.5)

## 2021-10-21 LAB — MICROSCOPIC EXAMINATION
Epithelial Cells (non renal): NONE SEEN /hpf (ref 0–10)
WBC, UA: >30 /hpf — AB (ref 0–5)
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
## 2021-10-25 LAB — CULTURE, URINE COMPREHENSIVE

## 2021-10-31 ENCOUNTER — Other Ambulatory Visit: Payer: Self-pay | Admitting: *Deleted

## 2021-10-31 ENCOUNTER — Telehealth: Payer: Self-pay

## 2021-10-31 MED ORDER — DOXYCYCLINE HYCLATE 100 MG PO CAPS
100.0000 mg | ORAL_CAPSULE | Freq: Two times a day (BID) | ORAL | 0 refills | Status: AC
Start: 2021-10-31 — End: 2021-11-05

## 2021-10-31 MED ORDER — AMOXICILLIN-POT CLAVULANATE 875-125 MG PO TABS: 1.0000 | ORAL_TABLET | Freq: Two times a day (BID) | ORAL | 0 refills | Status: DC

## 2021-10-31 NOTE — Telephone Encounter (Signed)
Incoming call on triage line from [place] at [place] regarding pt's medication allergy. Per Debroah Loop, pt needs doxycycline instead. New Rx sent. Pharmacy notified.  ?

## 2021-11-04 ENCOUNTER — Ambulatory Visit (INDEPENDENT_AMBULATORY_CARE_PROVIDER_SITE_OTHER): Payer: Medicare Other | Admitting: Urology

## 2021-11-04 DIAGNOSIS — R310 Gross hematuria: Secondary | ICD-10-CM

## 2021-11-04 DIAGNOSIS — R8271 Bacteriuria: Secondary | ICD-10-CM | POA: Diagnosis not present

## 2021-11-04 DIAGNOSIS — Z87440 Personal history of urinary (tract) infections: Secondary | ICD-10-CM

## 2021-11-04 NOTE — Progress Notes (Signed)
? ?  11/04/2021 ? ?CC:  ?Chief Complaint  ?Patient presents with  ? Cysto  ? ? 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  ? ?She was seen by Debroah Loop, PA-C, on 10/21/2021 ? ?She underwent CTAP with contrast on 08/19/2021 which revealed nonobstructing right hydronephrosis as well as mild right hydronephrosis to the level of the UPJ, slightly increased compared to scan dated 12/10/2014. Renal function is normal. ? ?She is currently on doxycycline. ? ?NED. A&Ox3.   ?No respiratory distress   ?Abd soft, NT, ND ?Normal external genitalia with patent urethral meatus ? ?Cystoscopy Procedure Note ? ?Patient identification was confirmed, informed consent was obtained, and patient was prepped using Betadine solution.  Lidocaine jelly was administered per urethral meatus.   ? ?Procedure: ?- Flexible cystoscope introduced, without any difficulty.   ?- Thorough search of the bladder revealed: ?   normal urethral meatus ?   normal urothelium with some free-floating debris and mild trigonitis ?   no stones ?   no ulcers  ?   no tumors ?   no urethral polyps ?   no trabeculation ? ?- Ureteral orifices were normal in position and appearance. ? ?Post-Procedure: ?- Patient tolerated the procedure well ? ?Assessment/ Plan: ? ?Gross hematuria  ?- Unclear whether this is from bladder  ?-recent upper tract imaging and cystoscopy was unremarkable which is very reassuring ?- as per previous, would recommend she be seen or evaluated on days she is having questionable hematuria in order to assess whether or not is coming from her bladder ? ?2. Chronic bacterial colonization  ?- asymptomatic  ?-would not recommend she be treated unless she has systemic signs of infection  ?- She is currently on doxycycline there is no need for any periprocedural antibiotics  ? ?As  needed ? ?Conley Rolls as a scribe for Hollice Espy, MD.,have documented all relevant documentation on the behalf of Hollice Espy, MD,as directed by  Hollice Espy, MD while in the presence of Hollice Espy, MD. ? ?I have reviewed the above documentation for accuracy and completeness, and I agree with the above.  ? ?Hollice Espy, MD ? ?

## 2021-11-24 ENCOUNTER — Ambulatory Visit
Admission: RE | Admit: 2021-11-24 | Discharge: 2021-11-24 | Disposition: A | Discharge status: Home / Self Care | Payer: Medicare Other | Source: Ambulatory Visit | Attending: Nurse Practitioner | Admitting: Nurse Practitioner

## 2021-11-24 DIAGNOSIS — N9489 Other specified conditions associated with female genital organs and menstrual cycle: Secondary | ICD-10-CM | POA: Insufficient documentation

## 2021-11-24 LAB — POCT I-STAT CREATININE: Creatinine, Ser: 1 mg/dL (ref 0.44–1.00)

## 2021-11-24 MED ORDER — IOHEXOL 300 MG/ML  SOLN
100.0000 mL | Freq: Once | INTRAMUSCULAR | Status: AC | PRN
  Administered 2021-11-24: 100 mL via INTRAVENOUS

## 2021-11-26 ENCOUNTER — Inpatient Hospital Stay: Payer: Medicare Other | Attending: Obstetrics and Gynecology | Admitting: Obstetrics and Gynecology

## 2021-11-26 VITALS — BP 98/51 | HR 55 | Temp 98.7°F | Resp 20

## 2021-11-26 DIAGNOSIS — N839 Noninflammatory disorder of ovary, fallopian tube and broad ligament, unspecified: Secondary | ICD-10-CM | POA: Diagnosis present

## 2021-11-26 DIAGNOSIS — N9489 Other specified conditions associated with female genital organs and menstrual cycle: Secondary | ICD-10-CM

## 2021-11-26 DIAGNOSIS — K862 Cyst of pancreas: Secondary | ICD-10-CM | POA: Diagnosis not present

## 2021-11-26 DIAGNOSIS — Z7189 Other specified counseling: Secondary | ICD-10-CM | POA: Diagnosis not present
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
## 2021-12-02 ENCOUNTER — Telehealth: Payer: Self-pay

## 2021-12-02 NOTE — Telephone Encounter (Signed)
At the request of Dr. Theora Gianotti I have called and spoken with Ms. Laura Gonzalez regarding biopsy and treatment. She states that if biopsy is positive for cancer that she would be interested in treatment/chemotherapy. She also states that regardless, she really wants to know what this is. I have sent Dr. Theora Gianotti this information.

## 2021-12-04 ENCOUNTER — Other Ambulatory Visit: Payer: Self-pay

## 2021-12-04 ENCOUNTER — Telehealth: Payer: Self-pay

## 2021-12-04 DIAGNOSIS — N9489 Other specified conditions associated with female genital organs and menstrual cycle: Secondary | ICD-10-CM

## 2021-12-04 NOTE — Telephone Encounter (Signed)
Called and spoke with Ms. Carma Leaven and the staff with MRI appointment details. 12/16/21 at [facility]. Arrive at 0930 for 1000 appointment. Do not eat or drink for 4 hours before. She will require lift for placement on table.

## 2021-12-04 NOTE — Progress Notes (Unsigned)
Spoke with Dr. Theora Gianotti. MRI prior to biopsy as requested by IR ordered.

## 2021-12-15 DIAGNOSIS — N838 Other noninflammatory disorders of ovary, fallopian tube and broad ligament: Secondary | ICD-10-CM | POA: Insufficient documentation

## 2021-12-16 ENCOUNTER — Ambulatory Visit
Admission: RE | Admit: 2021-12-16 | Discharge: 2021-12-16 | Disposition: A | Discharge status: Home / Self Care | Payer: Medicare Other | Source: Ambulatory Visit | Attending: Nurse Practitioner | Admitting: Nurse Practitioner

## 2021-12-16 DIAGNOSIS — N9489 Other specified conditions associated with female genital organs and menstrual cycle: Secondary | ICD-10-CM | POA: Diagnosis present

## 2021-12-16 MED ORDER — GADOBUTROL 1 MMOL/ML IV SOLN
10.0000 mL | Freq: Once | INTRAVENOUS | Status: AC | PRN
  Administered 2021-12-16: 10 mL via INTRAVENOUS

## 2021-12-19 ENCOUNTER — Telehealth: Payer: Self-pay

## 2021-12-19 NOTE — Telephone Encounter (Signed)
Called and updated Laura Gonzalez that Dr. Theora Gianotti has reviewed her MRI and notification has been sent to IR to arrange biopsy. We will notify her once this has been arranged.

## 2022-01-06 ENCOUNTER — Telehealth: Payer: Self-pay

## 2022-01-06 NOTE — Telephone Encounter (Signed)
Messages have been sent to Dr's Secord/Yasser to communicate regarding biopsy. All requested imaging was completed. Laura Gonzalez is awaiting response on decision.

## 2022-01-13 ENCOUNTER — Telehealth: Payer: Self-pay

## 2022-01-13 ENCOUNTER — Telehealth: Payer: Self-pay | Admitting: Obstetrics and Gynecology

## 2022-01-13 NOTE — Telephone Encounter (Signed)
Patient is a resident at [facility]. Laura Gonzalez called to follow up on a biopsy that needed to be do for patient and they haven't  heard anything back. Please advise. Thank you

## 2022-01-13 NOTE — Telephone Encounter (Signed)
Joelene Millin, RN from [facility] calling in regards to biopsy for patient, she states they never received any information about it. I tried to call back to let them know it hasn't been scheduled yet but I didn't get an answer. Her call back is [phone]

## 2022-01-13 NOTE — Telephone Encounter (Signed)
Attempted to call back. No answer. Dr. Theora Gianotti will likely reach out to her tomorrow during clinic.

## 2022-01-15 ENCOUNTER — Telehealth: Payer: Self-pay

## 2022-01-15 NOTE — Telephone Encounter (Signed)
Request to hold Eliquis faxed to [facility].

## 2022-01-15 NOTE — Telephone Encounter (Signed)
Hi Pamala Hurry and Marcie Bal, can we please schedule this patient for CT guided biopsy of pelvic mass? It can be scheduled on a day when I'm there because there has been lots of conversation about her and would easier for me to see her. Thanks.   Dr Albin Felling  MD

## 2022-01-16 ENCOUNTER — Other Ambulatory Visit: Payer: Self-pay | Admitting: Nurse Practitioner

## 2022-01-16 ENCOUNTER — Telehealth: Payer: Self-pay

## 2022-01-16 DIAGNOSIS — N9489 Other specified conditions associated with female genital organs and menstrual cycle: Secondary | ICD-10-CM

## 2022-01-16 NOTE — Progress Notes (Signed)
Spoke with patient's RN (Denisa) at [facility] 01/16/22 @ 11:15 am. Martin Majestic over pre-procedure instructions to include the need to arrive at 9:00 for 10:00 appointment, need to be NPO after midnight on night prior to procedure, need to hold Eliquis with last dose on Sun. 01/18/22 and need for driver post procedure. Patient's RN verbalized understanding.

## 2022-01-16 NOTE — Telephone Encounter (Signed)
Called and spoke with the [facility] regarding biopsy. It has been scheduled on 01/21/22 at 1000 with a 0900 [facility]. She was instructed to take her Eliquis on 7/30 then hold it until she is instructed to resume. Read back performed. Specials will call with any further instructions. [phone].

## 2022-01-19 ENCOUNTER — Telehealth: Payer: Self-pay | Admitting: *Deleted

## 2022-01-19 ENCOUNTER — Telehealth: Payer: Self-pay

## 2022-01-19 NOTE — Telephone Encounter (Signed)
Call returned to Laura Gonzalez. Several questions answered regarding biopsy scheduled for 01/21/22. Encouraged her to call back if she has any further questions.

## 2022-01-19 NOTE — Telephone Encounter (Signed)
Mateo Flow, RN from [facility] at [facility] called with questions regarding patient holding Eliquis for biopsy that is scheduled Wednesday 8/2. Patient took am dose and facility would like to know if patient can proceed and when to hold. Secure chat message sent to IR/Specialty Scheduling team and it was confirmed that patient may proceed with procedure on Wednesday 8/2 as long as there are total of 4 doses held and patient does not receive again prior to procedure. Call placed to facility with update.

## 2022-01-20 ENCOUNTER — Other Ambulatory Visit: Payer: Self-pay | Admitting: Radiology

## 2022-01-20 DIAGNOSIS — N9489 Other specified conditions associated with female genital organs and menstrual cycle: Secondary | ICD-10-CM
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
## 2022-01-21 ENCOUNTER — Other Ambulatory Visit: Payer: Self-pay

## 2022-01-21 ENCOUNTER — Ambulatory Visit
Admission: RE | Admit: 2022-01-21 | Discharge: 2022-01-21 | Disposition: A | Discharge status: Home / Self Care | Payer: Medicare Other | Source: Ambulatory Visit | Attending: Nurse Practitioner | Admitting: Nurse Practitioner

## 2022-01-21 DIAGNOSIS — K219 Gastro-esophageal reflux disease without esophagitis: Secondary | ICD-10-CM | POA: Insufficient documentation

## 2022-01-21 DIAGNOSIS — D3911 Neoplasm of uncertain behavior of right ovary: Secondary | ICD-10-CM | POA: Insufficient documentation

## 2022-01-21 DIAGNOSIS — I4891 Unspecified atrial fibrillation: Secondary | ICD-10-CM | POA: Insufficient documentation

## 2022-01-21 DIAGNOSIS — E785 Hyperlipidemia, unspecified: Secondary | ICD-10-CM | POA: Diagnosis not present

## 2022-01-21 DIAGNOSIS — N83201 Unspecified ovarian cyst, right side: Secondary | ICD-10-CM | POA: Diagnosis not present

## 2022-01-21 DIAGNOSIS — J45909 Unspecified asthma, uncomplicated: Secondary | ICD-10-CM | POA: Diagnosis not present

## 2022-01-21 DIAGNOSIS — G4733 Obstructive sleep apnea (adult) (pediatric): Secondary | ICD-10-CM | POA: Diagnosis not present

## 2022-01-21 DIAGNOSIS — N9489 Other specified conditions associated with female genital organs and menstrual cycle: Secondary | ICD-10-CM | POA: Diagnosis present

## 2022-01-21 DIAGNOSIS — I509 Heart failure, unspecified: Secondary | ICD-10-CM | POA: Insufficient documentation

## 2022-01-21 DIAGNOSIS — I110 Hypertensive heart disease with heart failure: Secondary | ICD-10-CM | POA: Insufficient documentation

## 2022-01-21 DIAGNOSIS — Z7901 Long term (current) use of anticoagulants: Secondary | ICD-10-CM | POA: Insufficient documentation

## 2022-01-21 LAB — CBC
HCT: 45.2 % (ref 36.0–46.0)
Hemoglobin: 14.4 g/dL (ref 12.0–15.0)
MCH: 31.9 pg (ref 26.0–34.0)
MCHC: 31.9 g/dL (ref 30.0–36.0)
MCV: 100 fL (ref 80.0–100.0)
Platelets: 225 10*3/uL (ref 150–400)
RBC: 4.52 MIL/uL (ref 3.87–5.11)
RDW: 13.6 % (ref 11.5–15.5)
WBC: 5.2 10*3/uL (ref 4.0–10.5)
nRBC: 0 % (ref 0.0–0.2)

## 2022-01-21 LAB — PROTIME-INR
INR: 1.1 (ref 0.8–1.2)
Prothrombin Time: 14.3 seconds (ref 11.4–15.2)

## 2022-01-21 MED ORDER — FENTANYL CITRATE (PF) 100 MCG/2ML IJ SOLN
INTRAMUSCULAR | Status: AC
  Filled 2022-01-21: qty 2

## 2022-01-21 MED ORDER — SODIUM CHLORIDE 0.9 % IV SOLN: INTRAVENOUS | Status: DC

## 2022-01-21 MED ORDER — FENTANYL CITRATE (PF) 100 MCG/2ML IJ SOLN
INTRAMUSCULAR | Status: AC | PRN
  Administered 2022-01-21 (×4): 25 ug via INTRAVENOUS

## 2022-01-21 NOTE — Procedures (Signed)
Interventional Radiology Procedure Note  Date of Procedure: 01/21/2022  Procedure: CT guided aspiration of right ovarian mass   Findings:  1. CT guided aspiration of right ovarian mass, 2m serosanguinous fluid aspirated and sent for cytology    Complications: No immediate complications noted.   Estimated Blood Loss: minimal  Follow-up and Recommendations: 1. Bedrest 1 hour    YAlbin Felling MD  Vascular & Interventional Radiology  01/21/2022 12:20 PM

## 2022-01-22 LAB — CYTOLOGY - NON PAP

## 2022-01-23 ENCOUNTER — Telehealth: Payer: Self-pay

## 2022-01-23 NOTE — Telephone Encounter (Signed)
Results of pathology sent to Dr. Theora Gianotti for review.

## 2022-02-02 ENCOUNTER — Telehealth: Payer: Self-pay | Admitting: Obstetrics and Gynecology

## 2022-02-02 NOTE — Telephone Encounter (Signed)
Abigail from the facility where patient resides has called reporting that patient is bed-ridden and would like the appointment this week to be virtual if at all possible.    Please advise

## 2022-02-04 ENCOUNTER — Inpatient Hospital Stay: Payer: Medicare Other

## 2022-02-04 ENCOUNTER — Inpatient Hospital Stay: Payer: Medicare Other | Attending: Obstetrics and Gynecology | Admitting: Obstetrics and Gynecology

## 2022-02-04 DIAGNOSIS — N9489 Other specified conditions associated with female genital organs and menstrual cycle: Secondary | ICD-10-CM | POA: Diagnosis not present

## 2022-02-04 DIAGNOSIS — N839 Noninflammatory disorder of ovary, fallopian tube and broad ligament, unspecified: Secondary | ICD-10-CM | POA: Insufficient documentation

## 2022-02-04 NOTE — Progress Notes (Addendum)
I called Ms. Hornaday for her telephone visit. After reviewing the results from her biopsy as noted below she would like to proceed with surgery in order to get a definitive diagnosis. I do not know if surgery is possible.  I have asked our team to follow-up with her cardiologist and also refer to pulmonary for consideration of surgical clearance.  If she does have surgery we would need to perform the procedure at [facility].  We will follow-up after the consultations are complete. Mariea Clonts, RN is aware and will try to arrange these appointments in the next 2 weeks  CYTOLOGY - NON GYN CYTOLOGY - NON PAP  CASE: [id number]  PATIENT: Laura Gonzalez  Non-Gynecological Cytology Report      Specimen Submitted:  A. Cyst, right ovary   Clinical History: Concern for ovarian malignancy       DIAGNOSIS:  A.  RIGHT OVARIAN CYSTIC LESION; ASPIRATE:  - RARE ATYPICAL CELLS SUSPICIOUS FOR OVARIAN NEOPLASM (SEE COMMENT).   Comment: There are rare atypical cells present.   Without tissue, the  architectural features cannot be discerned and given the scant number of  atypical cells present the specimen is suspicious but not diagnostic of  malignancy.    A total of  15 minutes were spent with the patient today; >50% was spent reviewing results, discussing patient's desire for surgery, counseling and coordination of care.  I connected with  [name] on 02/16/22 by telephone enabled telemedicine application and verified that I am speaking with the correct person using two identifiers.  The patient expressed understanding and agreed to proceed with telephone visit to discuss her results and management options.     Lavance Beazer Gaetana Michaelis, MD

## 2022-02-04 NOTE — Progress Notes (Signed)
Referral sent to [facility] cardiology and pulmonology.

## 2022-02-13 ENCOUNTER — Telehealth: Payer: Self-pay

## 2022-02-13 NOTE — Telephone Encounter (Signed)
Call placed to [facility] cardiology to follow up on referral placed for cardiac clearance for potential surgery. It has not been scheduled. Provided contact information for the [place] to contact for appointment.

## 2022-03-04 ENCOUNTER — Telehealth: Payer: Self-pay

## 2022-03-04 NOTE — Telephone Encounter (Signed)
Attempted to call Ms. Stech regarding setting up appointment with Dr. Theora Gianotti at [facility] to discuss setting up surgery and continued clearance needed. No answer.

## 2022-03-06 ENCOUNTER — Telehealth: Payer: Self-pay

## 2022-03-06 NOTE — Telephone Encounter (Signed)
Spoke with Laura Gonzalez regarding clearance needed for surgery.She confirms ECHO that Dr. Ubaldo Glassing requested has not been scheduled. Contacted [facility] cardiology and they will have ECHO arranged and call her with appointment.

## 2022-03-09 ENCOUNTER — Other Ambulatory Visit: Payer: Self-pay | Admitting: Cardiology

## 2022-03-09 DIAGNOSIS — R0602 Shortness of breath: Secondary | ICD-10-CM

## 2022-03-19 ENCOUNTER — Ambulatory Visit
Admission: RE | Admit: 2022-03-19 | Discharge: 2022-03-19 | Disposition: A | Discharge status: Home / Self Care | Payer: Medicare Other | Source: Ambulatory Visit | Attending: Cardiology | Admitting: Cardiology

## 2022-03-19 DIAGNOSIS — I119 Hypertensive heart disease without heart failure: Secondary | ICD-10-CM | POA: Diagnosis not present

## 2022-03-19 DIAGNOSIS — E785 Hyperlipidemia, unspecified: Secondary | ICD-10-CM | POA: Insufficient documentation

## 2022-03-19 DIAGNOSIS — I071 Rheumatic tricuspid insufficiency: Secondary | ICD-10-CM | POA: Insufficient documentation

## 2022-03-19 DIAGNOSIS — R0602 Shortness of breath: Secondary | ICD-10-CM | POA: Insufficient documentation

## 2022-03-19 DIAGNOSIS — I4891 Unspecified atrial fibrillation: Secondary | ICD-10-CM | POA: Diagnosis not present

## 2022-03-19 LAB — ECHOCARDIOGRAM COMPLETE
AR max vel: 1.92 cm2
AV Area VTI: 2.06 cm2
AV Area mean vel: 1.81 cm2
AV Mean grad: 4.3 mmHg
AV Peak grad: 7.8 mmHg
Ao pk vel: 1.39 m/s
Area-P 1/2: 2.87 cm2
S' Lateral: 2.6 cm

## 2022-03-19 NOTE — Progress Notes (Signed)
*  PRELIMINARY RESULTS* Echocardiogram 2D Echocardiogram has been performed.  Laura Gonzalez 03/19/2022, 10:35 AM

## 2022-03-24 ENCOUNTER — Telehealth: Payer: Self-pay

## 2022-03-24 NOTE — Telephone Encounter (Signed)
ECHO has been completed. Demographics sent to [facility] to arrange appointment with Dr. Theora Gianotti to discuss surgery.

## 2022-06-01 DIAGNOSIS — R319 Hematuria, unspecified: Secondary | ICD-10-CM | POA: Insufficient documentation

## 2022-06-03 ENCOUNTER — Other Ambulatory Visit: Payer: Self-pay

## 2022-06-03 ENCOUNTER — Ambulatory Visit: Payer: Medicare Other | Admitting: Urology

## 2022-06-03 DIAGNOSIS — N9489 Other specified conditions associated with female genital organs and menstrual cycle: Secondary | ICD-10-CM

## 2022-06-17 ENCOUNTER — Other Ambulatory Visit: Payer: Medicare Other

## 2022-06-17 ENCOUNTER — Ambulatory Visit: Payer: Medicare Other

## 2022-06-23 ENCOUNTER — Encounter: Payer: Self-pay | Admitting: Urology

## 2022-06-23 ENCOUNTER — Ambulatory Visit (INDEPENDENT_AMBULATORY_CARE_PROVIDER_SITE_OTHER): Payer: Medicare Other | Admitting: Urology

## 2022-06-23 VITALS — BP 110/58 | HR 49 | Ht 66.5 in

## 2022-06-23 DIAGNOSIS — R8271 Bacteriuria: Secondary | ICD-10-CM | POA: Diagnosis not present

## 2022-06-23 DIAGNOSIS — R310 Gross hematuria: Secondary | ICD-10-CM | POA: Diagnosis not present
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
## 2022-07-01 ENCOUNTER — Inpatient Hospital Stay: Payer: Medicare Other | Attending: Obstetrics and Gynecology | Admitting: Obstetrics and Gynecology

## 2022-07-01 ENCOUNTER — Inpatient Hospital Stay: Payer: Medicare Other

## 2022-07-01 VITALS — BP 102/51 | HR 54 | Temp 98.6°F | Resp 19

## 2022-07-01 DIAGNOSIS — D3911 Neoplasm of uncertain behavior of right ovary: Secondary | ICD-10-CM | POA: Diagnosis present

## 2022-07-01 DIAGNOSIS — N9489 Other specified conditions associated with female genital organs and menstrual cycle: Secondary | ICD-10-CM

## 2022-07-01 NOTE — Progress Notes (Signed)
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 colon and sigmoid colon, without surrounding inflammatory changes to suggest an acute diverticulitis at this time. Normal appendix.   Vascular/Lymphatic: Aortic atherosclerosis, without evidence of aneurysm or dissection in the abdominal or pelvic vasculature. No lymphadenopathy noted in the abdomen or pelvis.   Reproductive: In the right adnexal region there is again a complex lesion (axial image 72 of series 2 and coronal image 57 of series 4) which measures 8.5 x 7.9 x 7.0 cm. By CT imaging, it is uncertain whether not this is a lesion of the right ovary, or an exophytic mass from the fundus of the uterus, however, this was previously  characterized as a right adnexal lesion on pelvic ultrasound 07/15/2021. This lesion demonstrates central low attenuation, with thick peripheral wall which enhances. Uterus and left ovary  are otherwise unremarkable in appearance.   Other: Postoperative changes of mesh repair for ventral hernia are noted. No significant volume of ascites. No pneumoperitoneum.   Musculoskeletal: Chronic compression fracture of superior endplate of L3 with 27% loss of anterior vertebral body height, unchanged. There are no aggressive appearing lytic or blastic lesions noted in the visualized portions of the skeleton.   IMPRESSION: 1. 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 No definite metastatic disease noted in the abdomen or pelvis on today's examination. Consultation with Gyn-Onc is recommended for potential surgical resection. 2. No definite lymphadenopathy or malignant ascites at this time. 3. Cholelithiasis without definitive evidence of acute cholecystitis. Colonic diverticulosis without evidence of acute diverticulitis at this time. 4. Nonobstructive calculi in the collecting system of the right kidney measuring up to 7 mm. 5. Aortic atherosclerosis, in addition to at least 2 vessel coronary artery disease. Please note that although the presence of coronary artery calcium documents the presence of coronary artery disease, the severity of this disease and any potential stenosis cannot be assessed on this non-gated CT examination. Assessment for potential risk factor modification, dietary therapy or pharmacologic therapy may be warranted, if clinically indicated.    Assessment:  Laura Gonzalez is a 82 y.o. 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 Because she is a poor surgical candidate, biopsy done 8/23 and showed atypical cells.  CT scan recently shows persistent complex cystic right adnexal mass, now 11 cm. Although mass concerning for cancer and there has been some growth in past year, there has been no evidence of  metastatic disease and she is clinically asymptomatic. Since she is a poor surgical candidate, we have not strongly recommended surgery. .   Periumbilical pain- s/p hernia repairs with mesh.   Urinary symptoms in the setting of h/o UTIs and increased intake d/t hx of constipation  Medical co-morbidities complicating care: PMH significant for a fib, on Eliquis, obesity, chronic diastolic CHF, asthma, hypoxic respiratory failure; BMI >45  Plan:   Problem List Items Addressed This Visit   None Visit Diagnoses     Adnexal mass    -  Primary   Pancreas cyst       Counseling and coordination of care          We again discussed management options including surgery vs surveillance.  She remains asymptomatic and confined to a wheel chair.  We discussed that with her age, heart failure, immobility and morbid obesity that the risk of perioperative severe complications or death is very high.  Meanwhile, the mass is not causing any symptoms or demonstrating evidence of metastatic potential. If she wanted surgery would suggest workup in [place] clinic, but for now she is in agreement with watchful waiting. Spoke to Dr Theora Gianotti and she is agreement with this decision.  We will see her back in 3-4 months with CA125.   Possible hernia now known to be diastases. See by Dr. Hampton Abbot. No surgical intervention needed. He recommended an abdominal binder.   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 CT pancreatic protocol to evaluate further was reassuring.       The patient's diagnosis, an outline of the further diagnostic and laboratory studies which  will be required, the recommendation for surgery, and alternatives were discussed with her and her accompanying family members.  All questions were answered to their satisfaction.   Mellody Drown, MD  CC:  Will Bonnet, MD [address] [place],  [place] [id number] [phone]

## 2022-07-02 LAB — CA 125: Cancer Antigen (CA) 125: 15 U/mL (ref 0.0–38.1)

## 2022-07-02 LAB — HUMAN EPIDIDYMIS PROT 4,SERIAL: HE4: 175 pmol/L — ABNORMAL HIGH (ref 0.0–96.9)

## 2022-07-22 ENCOUNTER — Other Ambulatory Visit: Payer: Medicare Other | Admitting: Urology

## 2022-08-24 ENCOUNTER — Other Ambulatory Visit: Payer: Self-pay | Admitting: Internal Medicine

## 2022-08-24 ENCOUNTER — Encounter: Payer: Self-pay | Admitting: Internal Medicine

## 2022-08-24 DIAGNOSIS — R1900 Intra-abdominal and pelvic swelling, mass and lump, unspecified site: Secondary | ICD-10-CM

## 2022-08-24 DIAGNOSIS — R109 Unspecified abdominal pain: Secondary | ICD-10-CM

## 2022-08-25 ENCOUNTER — Ambulatory Visit
Admission: RE | Admit: 2022-08-25 | Discharge: 2022-08-25 | Disposition: A | Discharge status: Home / Self Care | Payer: Medicare Other | Source: Ambulatory Visit | Attending: Internal Medicine | Admitting: Internal Medicine

## 2022-08-25 DIAGNOSIS — R1900 Intra-abdominal and pelvic swelling, mass and lump, unspecified site: Secondary | ICD-10-CM

## 2022-08-25 DIAGNOSIS — R109 Unspecified abdominal pain: Secondary | ICD-10-CM

## 2022-08-25 LAB — POCT I-STAT CREATININE: Creatinine, Ser: 1.4 mg/dL — ABNORMAL HIGH (ref 0.44–1.00)

## 2022-08-25 MED ORDER — IOHEXOL 350 MG/ML SOLN
100.0000 mL | Freq: Once | INTRAVENOUS | Status: AC | PRN
  Administered 2022-08-25: 70 mL via INTRAVENOUS

## 2022-09-04 ENCOUNTER — Encounter: Payer: Self-pay | Admitting: Nurse Practitioner

## 2022-09-04 ENCOUNTER — Non-Acute Institutional Stay: Payer: Medicare Other | Admitting: Nurse Practitioner

## 2022-09-04 ENCOUNTER — Ambulatory Visit: Payer: Medicare Other | Admitting: Physician Assistant

## 2022-09-04 DIAGNOSIS — Z515 Encounter for palliative care: Secondary | ICD-10-CM

## 2022-09-04 DIAGNOSIS — N838 Other noninflammatory disorders of ovary, fallopian tube and broad ligament: Secondary | ICD-10-CM

## 2022-09-04 DIAGNOSIS — G8929 Other chronic pain: Secondary | ICD-10-CM
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
## 2022-09-10 ENCOUNTER — Ambulatory Visit (INDEPENDENT_AMBULATORY_CARE_PROVIDER_SITE_OTHER): Payer: Medicare Other | Admitting: Physician Assistant

## 2022-09-10 ENCOUNTER — Encounter: Payer: Self-pay | Admitting: Physician Assistant

## 2022-09-10 VITALS — BP 131/74 | HR 59

## 2022-09-10 DIAGNOSIS — R8271 Bacteriuria: Secondary | ICD-10-CM | POA: Diagnosis not present

## 2022-09-10 DIAGNOSIS — N200 Calculus of kidney: Secondary | ICD-10-CM | POA: Diagnosis not present

## 2022-09-10 DIAGNOSIS — R310 Gross hematuria: Secondary | ICD-10-CM
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
## 2022-09-30 ENCOUNTER — Encounter: Payer: Self-pay | Admitting: Obstetrics and Gynecology

## 2022-09-30 ENCOUNTER — Inpatient Hospital Stay: Payer: Medicare Other | Attending: Obstetrics and Gynecology

## 2022-09-30 ENCOUNTER — Inpatient Hospital Stay (HOSPITAL_BASED_OUTPATIENT_CLINIC_OR_DEPARTMENT_OTHER): Payer: Medicare Other | Admitting: Obstetrics and Gynecology

## 2022-09-30 VITALS — BP 102/67 | HR 58 | Resp 18 | Ht 66.5 in | Wt 242.0 lb

## 2022-09-30 DIAGNOSIS — Z993 Dependence on wheelchair: Secondary | ICD-10-CM | POA: Insufficient documentation

## 2022-09-30 DIAGNOSIS — R001 Bradycardia, unspecified: Secondary | ICD-10-CM | POA: Diagnosis not present

## 2022-09-30 DIAGNOSIS — Z7901 Long term (current) use of anticoagulants: Secondary | ICD-10-CM | POA: Insufficient documentation

## 2022-09-30 DIAGNOSIS — I110 Hypertensive heart disease with heart failure: Secondary | ICD-10-CM | POA: Diagnosis not present

## 2022-09-30 DIAGNOSIS — I4891 Unspecified atrial fibrillation: Secondary | ICD-10-CM | POA: Insufficient documentation

## 2022-09-30 DIAGNOSIS — Z6841 Body Mass Index (BMI) 40.0 and over, adult: Secondary | ICD-10-CM | POA: Diagnosis not present

## 2022-09-30 DIAGNOSIS — E7800 Pure hypercholesterolemia, unspecified: Secondary | ICD-10-CM | POA: Insufficient documentation

## 2022-09-30 DIAGNOSIS — N9489 Other specified conditions associated with female genital organs and menstrual cycle: Secondary | ICD-10-CM

## 2022-09-30 DIAGNOSIS — D3911 Neoplasm of uncertain behavior of right ovary: Secondary | ICD-10-CM | POA: Diagnosis present

## 2022-09-30 DIAGNOSIS — K862 Cyst of pancreas: Secondary | ICD-10-CM | POA: Insufficient documentation

## 2022-09-30 DIAGNOSIS — G4730 Sleep apnea, unspecified: Secondary | ICD-10-CM | POA: Insufficient documentation

## 2022-09-30 DIAGNOSIS — N838 Other noninflammatory disorders of ovary, fallopian tube and broad ligament: Secondary | ICD-10-CM

## 2022-09-30 NOTE — Progress Notes (Signed)
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 tail, measuring 1.5 x 1.1 cm (series 12, image 20). No pancreatic ductal dilatation or surrounding inflammatory changes.   Spleen: Normal in size without significant abnormality.   Adrenals/Urinary Tract: Adrenal glands are unremarkable. Nonobstructive inferior pole calculi of the right kidney. Bladder is unremarkable.   Stomach/Bowel: Stomach is within normal limits. No evidence of bowel wall thickening, distention, or inflammatory changes. Colonic diverticulosis.   Vascular/Lymphatic: Aortic atherosclerosis. No enlarged abdominal  lymph nodes.   Reproductive: No mass or other significant abnormality.   Other: No abdominal wall hernia or abnormality. No ascites.   Musculoskeletal: No acute or significant osseous findings.   IMPRESSION: 1. 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 This is almost certainly a benign, incidental IPMN or pseudocyst, however as previously noted is new in the interval to 2016. Recommend initial follow-up pancreatic protocol CT or MRI in 1 year to  establish stability. This recommendation follows ACR white paper guidelines Gonzalez management of incidental pancreatic cystic lesions. 2. Cholelithiasis. 3. Nonobstructive right nephrolithiasis. 4. Colonic diverticulosis without evidence of acute diverticulitis.   Aortic Atherosclerosis (ICD10-I70.0).  11/24/2021 COMPARISON:  CT the abdomen 09/10/2021. CT the abdomen and pelvis 08/19/2021.   FINDINGS: Lower chest: Areas of scarring are noted throughout the lung bases bilaterally. Atherosclerotic calcifications in the descending thoracic aorta as well as the left circumflex and right coronary arteries. Calcifications of the mitral annulus.   Hepatobiliary: Several small calcified granulomas are scattered throughout the hepatic parenchyma. No definite suspicious cystic or solid hepatic lesions. No intra or extrahepatic biliary ductal dilatation. Gallbladder is markedly distended. Multiple partially calcified and partially cavitary gallstones are noted within the lumen of the gallbladder. Gallbladder wall does not appear thickened. No pericholecystic fluid or overt surrounding inflammatory changes are noted at this time.   Pancreas: In the tail of the pancreas there is a well-defined 1.5 x 1.3 cm centrally low-attenuation lesion (axial image 27 of series 2), stable. No other pancreatic mass. No pancreatic ductal dilatation. No peripancreatic fluid collections or inflammatory changes.   Spleen: Unremarkable.   Adrenals/Urinary Tract: Right-sided extrarenal pelvis (normal anatomical variant) incidentally noted. Multiple nonobstructive calculi in the right renal collecting system measuring up to 7 mm in the interpolar region. Left kidney and bilateral adrenal glands are normal in appearance. No hydroureteronephrosis. Urinary bladder is unremarkable in appearance.   Stomach/Bowel: The appearance of the stomach is normal. No pathologic dilatation of small bowel or colon. Numerous  colonic diverticulae are noted, particularly in the descending colon and sigmoid colon, without surrounding inflammatory changes to suggest an acute diverticulitis at this time. Normal appendix.   Vascular/Lymphatic: Aortic atherosclerosis, without evidence of aneurysm or dissection in the abdominal or pelvic vasculature. No lymphadenopathy noted in the abdomen or pelvis.   Reproductive: In the right adnexal region there is again a complex lesion (axial image 72 of series 2 and coronal image 57 of series 4) which measures 8.5 x 7.9 x 7.0 cm. By CT imaging, it is uncertain whether not this is a lesion of the right ovary, or an exophytic mass from the fundus of the uterus, however, this was previously characterized as a right adnexal lesion Gonzalez pelvic ultrasound 07/15/2021. This lesion demonstrates central low attenuation, with thick peripheral wall which enhances. Uterus and left ovary are otherwise unremarkable in appearance.   Other: Postoperative changes of mesh repair for ventral hernia are noted. No significant volume of ascites. No pneumoperitoneum.   Musculoskeletal: Chronic compression fracture of superior endplate of L3 with 25% loss of anterior vertebral body height, unchanged. There are no aggressive appearing lytic or blastic lesions noted in the visualized portions of the skeleton.   IMPRESSION: 1. 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 No definite metastatic disease noted in the abdomen or pelvis Gonzalez today's examination. Consultation with Gyn-Onc is recommended for potential surgical resection. 2. No definite  lymphadenopathy or malignant ascites at this time. 3. Cholelithiasis without definitive evidence of acute cholecystitis. Colonic diverticulosis without evidence of acute diverticulitis at this time. 4. Nonobstructive calculi in the collecting system of the right kidney measuring up to 7  mm. 5. Aortic atherosclerosis, in addition to at least 2 vessel coronary artery disease. Please note that although the presence of coronary artery calcium documents the presence of coronary artery disease, the severity of this disease and any potential stenosis cannot be assessed Gonzalez this non-gated CT examination. Assessment for potential risk factor modification, dietary therapy or pharmacologic therapy may be warranted, if clinically indicated.    Assessment:  TEEA DUCEY is a 82 y.o. 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 Because she is a poor surgical candidate, aspiration of a small amount of cyst fluid done 8/23 and showed atypical cells.  Plan was for expectant management as she is wheelchair bound and a poor surgical candidate.  CT scan recently done because of increased pain and shows persistent complex cystic right adnexal mass, now 13 cm. No evidence of metastatic disease.   Periumbilical pain- s/p hernia repairs with mesh.   Urinary symptoms in the setting of h/o UTIs and increased intake d/t hx of constipation  Medical co-morbidities complicating care: PMH significant for a fib, Gonzalez Eliquis, obesity, chronic diastolic CHF, asthma, hypoxic respiratory failure; BMI >45  Plan:   Problem List Items Addressed This Visit       Other   Ovarian mass - Primary   I saw the patient 3 months ago for second opinion and decision made for expectant management of pelvic mass because the mass was asymptomatic and she is a poor surgical candidate due to medical co-morbidities. CT did not show any evidence of metastatic cancer.  More recently she reportedly had increased abdominal pain and CT scan repeated and shows that the mass has increased in size to 13 cm.  Suggested drainage again, but she is not able to get out of her wheelchair to get Gonzalez the table for the procedure and the one attempt at aspiration was  very technically challenging due to her size and the location of the mass..  In speaking with her she is not really having much pain.  She does not take any pain medication.  Also has kidney stones Gonzalez that side that could be causing occasional pain.  She is followed by Urology.   We again discussed that with her age, heart failure/arythmia , immobility and morbid obesity that the risk of perioperative severe complications or death is very high.  Meanwhile, the mass is still not causing major symptoms or demonstrating evidence of metastatic potential. If she wanted surgery would suggest workup in [facility], but for now she is in agreement with continued watchful waiting. We will see her back as needed for further evaluation and management of the mass.   Possible hernia now known to be diastases. See by Dr. Aleen Campi. No surgical intervention needed. He recommended an abdominal binder.   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 CT pancreatic protocol to evaluate further was reassuring.  Do not think the pancreatic cyst Gonzalez the recent scan is new.  Will confirm with radiology.     The patient's diagnosis, an outline of the further diagnostic and laboratory studies which will be required, the recommendation for surgery, and alternatives were discussed with her and her accompanying family members.  All questions were answered to their satisfaction.   Leida Lauth, MD  CC:  Conard Novak, MD [address] [place],  [place] [address] [phone]

## 2022-10-01 LAB — CA 125: Cancer Antigen (CA) 125: 15.7 U/mL (ref 0.0–38.1)

## 2022-10-02 ENCOUNTER — Emergency Department
Admission: EM | Admit: 2022-10-02 | Discharge: 2022-10-03 | Disposition: A | Discharge status: Skilled Nursing Facility | Payer: Medicare Other | Attending: Emergency Medicine | Admitting: Emergency Medicine

## 2022-10-02 DIAGNOSIS — N200 Calculus of kidney: Secondary | ICD-10-CM | POA: Diagnosis not present

## 2022-10-02 DIAGNOSIS — R350 Frequency of micturition: Secondary | ICD-10-CM | POA: Diagnosis present

## 2022-10-02 DIAGNOSIS — R310 Gross hematuria: Secondary | ICD-10-CM | POA: Insufficient documentation

## 2022-10-02 DIAGNOSIS — Z7901 Long term (current) use of anticoagulants: Secondary | ICD-10-CM | POA: Insufficient documentation

## 2022-10-02 DIAGNOSIS — N83201 Unspecified ovarian cyst, right side: Secondary | ICD-10-CM | POA: Diagnosis not present

## 2022-10-02 LAB — CBC WITH DIFFERENTIAL/PLATELET
Abs Immature Granulocytes: 0.02 10*3/uL (ref 0.00–0.07)
Basophils Absolute: 0 10*3/uL (ref 0.0–0.1)
Basophils Relative: 1 %
Eosinophils Absolute: 0.2 10*3/uL (ref 0.0–0.5)
Eosinophils Relative: 3 %
HCT: 38.2 % (ref 36.0–46.0)
Hemoglobin: 12.3 g/dL (ref 12.0–15.0)
Immature Granulocytes: 0 %
Lymphocytes Relative: 19 %
Lymphs Abs: 1.3 10*3/uL (ref 0.7–4.0)
MCH: 33.3 pg (ref 26.0–34.0)
MCHC: 32.2 g/dL (ref 30.0–36.0)
MCV: 103.5 fL — ABNORMAL HIGH (ref 80.0–100.0)
Monocytes Absolute: 0.9 10*3/uL (ref 0.1–1.0)
Monocytes Relative: 13 %
Neutro Abs: 4.6 10*3/uL (ref 1.7–7.7)
Neutrophils Relative %: 64 %
Platelets: 262 10*3/uL (ref 150–400)
RBC: 3.69 MIL/uL — ABNORMAL LOW (ref 3.87–5.11)
RDW: 16 % — ABNORMAL HIGH (ref 11.5–15.5)
WBC: 7.1 10*3/uL (ref 4.0–10.5)
nRBC: 0 % (ref 0.0–0.2)

## 2022-10-02 LAB — URINALYSIS, W/ REFLEX TO CULTURE (INFECTION SUSPECTED)
Bilirubin Urine: NEGATIVE
Glucose, UA: NEGATIVE mg/dL
Ketones, ur: NEGATIVE mg/dL
Nitrite: NEGATIVE
Protein, ur: NEGATIVE mg/dL
RBC / HPF: >50 RBC/hpf (ref 0–5)
Specific Gravity, Urine: 1.005 (ref 1.005–1.030)
Squamous Epithelial / HPF: NONE SEEN /HPF (ref 0–5)
WBC, UA: >50 WBC/hpf (ref 0–5)
pH: 6 (ref 5.0–8.0)

## 2022-10-02 LAB — BASIC METABOLIC PANEL
Anion gap: 10 (ref 5–15)
BUN: 24 mg/dL — ABNORMAL HIGH (ref 8–23)
CO2: 28 mmol/L (ref 22–32)
Calcium: 10.9 mg/dL — ABNORMAL HIGH (ref 8.9–10.3)
Chloride: 99 mmol/L (ref 98–111)
Creatinine, Ser: 1.43 mg/dL — ABNORMAL HIGH (ref 0.44–1.00)
GFR, Estimated: 37 mL/min — ABNORMAL LOW (ref >60)
Glucose, Bld: 108 mg/dL — ABNORMAL HIGH (ref 70–99)
Potassium: 4.5 mmol/L (ref 3.5–5.1)
Sodium: 137 mmol/L (ref 135–145)

## 2022-10-02 NOTE — ED Provider Notes (Signed)
[facility] Provider Note    Event Date/Time   First MD Initiated Contact with Patient 10/02/22 2305     (approximate)   History   Hematuria   HPI Laura Gonzalez is a 82 y.o. female whose medical history is notable for a large right adnexal cystic mass concerning for neoplasm as well as prior history of kidney and bladder stones.  She presents tonight from her nursing facility for evaluation of gross hematuria and increased urinary frequency.  She said that over the last few hours it has been hard for her to hold her bladder until the nurses can come help her at her facility and she often will wet her undergarments.  It started with darker colored urine in the morning and then became blood by tonight.  She does not think any of the blood was coming from her vagina.  She was told that there was clots in the blood which is atypical for her, particularly because she is on Eliquis.  She said that she wanted to make sure that everything was okay and to see if she can get the problem fixed although she acknowledges that she has had this problem in the past.  She sees both urology and has recently seen a GYN/ONC provider that came to [facility] from [facility] regarding the adnexal mass.  The patient always has some degree of pain but said her pain is no worse than usual.  No vomiting.  No fever.  No chest pain or shortness of breath.  She is nonmobile at baseline and uses a wheelchair and requires heavy assistance to get around.     Physical Exam   Triage Vital Signs: ED Triage Vitals  Enc Vitals Group     BP 10/02/22 2300 (!) 123/47     Pulse Rate 10/02/22 2300 60     Resp 10/02/22 2300 (!) 23     Temp 10/02/22 2300 98.4 F (36.9 C)     Temp src --      SpO2 10/02/22 2255 94 %     Weight 10/02/22 2300 113.9 kg (251 lb)     Height 10/02/22 2300 1.651 m (5\' 5" )     Head Circumference --      Peak Flow --      Pain Score 10/02/22 2259 3     Pain Loc --      Pain Edu? --       Excl. in GC? --     Most recent vital signs: Vitals:   10/02/22 2255 10/02/22 2300  BP:  (!) 123/47  Pulse:  60  Resp:  (!) 23  Temp:  98.4 F (36.9 C)  SpO2: 94% 97%    General: Awake, clearly suffers from chronic medical conditions but does not appear acutely ill at this time. CV:  Good peripheral perfusion.  Regular rate and rhythm. Resp:  Normal effort. Speaking easily and comfortably, no accessory muscle usage nor intercostal retractions.   Abd:  Morbid obesity.  No tenderness to palpation throughout the abdomen. GYN:  I was present while 2 of her nurses were placing a three-way Foley catheter.  I was able to examine and verify that there is no vaginal bleeding.  ED nurses present throughout the exam as chaperone's.   ED Results / Procedures / Treatments   Labs (all labs ordered are listed, but only abnormal results are displayed) Labs Reviewed  CBC WITH DIFFERENTIAL/PLATELET - Abnormal; Notable for the following components:  Result Value   RBC 3.69 (*)    MCV 103.5 (*)    RDW 16.0 (*)    All other components within normal limits  BASIC METABOLIC PANEL - Abnormal; Notable for the following components:   Glucose, Bld 108 (*)    BUN 24 (*)    Creatinine, Ser 1.43 (*)    Calcium 10.9 (*)    GFR, Estimated 37 (*)    All other components within normal limits  URINALYSIS, W/ REFLEX TO CULTURE (INFECTION SUSPECTED) - Abnormal; Notable for the following components:   Color, Urine YELLOW (*)    APPearance HAZY (*)    Hgb urine dipstick MODERATE (*)    Leukocytes,Ua MODERATE (*)    Bacteria, UA RARE (*)    All other components within normal limits  URINE CULTURE  TYPE AND SCREEN     EKG  ED ECG REPORT I, Loleta Rose, the attending physician, personally viewed and interpreted this ECG.  Date: 10/02/2022 EKG Time: 23: 03 Rate: 98 Rhythm: Sinus rhythm QRS Axis: normal Intervals: Left bundle branch block ST/T Wave abnormalities: Non-specific ST segment /  T-wave changes, but no clear evidence of acute ischemia. Narrative Interpretation: Heavy artifact is present but the patient appears to have a sinus rhythm with no acute or emergent abnormalities.    RADIOLOGY I viewed and interpreted the patient's CT renal stone study.  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   PROCEDURES:  Critical Care performed: No  Procedures    IMPRESSION / MDM / ASSESSMENT AND PLAN / ED COURSE  I reviewed the triage vital signs and the nursing notes.                              Differential diagnosis includes, but is not limited to, renal/ureteral/bladder stones, bladder outlet obstruction, infection, neoplasm.  Patient's presentation is most consistent with acute presentation with potential threat to life or bodily function.  Labs/studies ordered: CT renal stone study, CBC with differential, urinalysis with reflex culture, type and screen, basic metabolic panel.  Interventions/Medications given:  Medications  sodium chloride irrigation 0.9 % 3,000 mL (3,000 mLs Irrigation New Bag/Given 10/03/22 0246)    (Note:  hospital course my include additional interventions and/or labs/studies not listed above.)   Patient's labs are stable from prior with a normal hemoglobin, stable chronic kidney disease with a creatinine of 1.4, and gross hematuria on urinalysis but without any obvious sign of infection.  Urine culture is pending.  I reviewed prior notes including the GYN/ONC clinic note by Dr. Johnnette Litter from 2 days ago and a prior note from Carman Ching with the [facility] clinic.  These notes confirmed the patient's chronic medical conditions as described above including the probable adnexal neoplasm  and the chronic staghorn calculus.  I ordered the placement of a three-way Foley catheter and we engaged in continuous bladder irrigation, but very quickly the urine ran a very light pink and we are able to discontinue it.  The patient reports that she is not in significant pain or discomfort and just wanted to see if anything could be done.  I explained to her that the evaluation was reassuring and that all of these issues are  chronic.  Ms. Vaillancourt's urology clinic note even mentioned that the patient has a history of episodes of gross hematuria for which her facility often wants her to be checked out.  I provided reassurance and encouraged close outpatient follow-up.  The patient is comfortable with the plan to return back to her facility and follow-up.  I gave my usual and customary return precautions.  The patient was on the cardiac monitor to evaluate for evidence of arrhythmia and/or significant heart rate changes.       FINAL CLINICAL IMPRESSION(S) / ED DIAGNOSES   Final diagnoses:  Gross hematuria  Staghorn calculus  Cyst of right ovary     Rx / DC Orders   ED Discharge Orders     None        Note:  This document was prepared using Dragon voice recognition software and may include unintentional dictation errors.   Loleta Rose, MD 10/03/22 [phone]

## 2022-10-02 NOTE — ED Triage Notes (Addendum)
Arrives via [facility] w/ chief complaint of hematuria/bloody vaginal discharge w/ large clots.  Patient has saturated 1 brief within the last hour.  Per EMS patient has small amount of haematuria due to known ovarian mass. Endorses RLQ/right flank, described as dull in nature.  States this pain is chronic and reoccurs every few days.  Patient has hx of nephrolithiasis.

## 2022-10-03 ENCOUNTER — Emergency Department: Payer: Medicare Other

## 2022-10-03 DIAGNOSIS — N83201 Unspecified ovarian cyst, right side: Secondary | ICD-10-CM | POA: Diagnosis not present

## 2022-10-03 LAB — TYPE AND SCREEN
ABO/RH(D): O POS
Antibody Screen: NEGATIVE

## 2022-10-03 MED ORDER — SODIUM CHLORIDE 0.9 % IR SOLN
3000.0000 mL | Status: DC
  Administered 2022-10-03: 3000 mL

## 2022-10-03 NOTE — ED Notes (Signed)
Received no answer at pt's facility to give report ([facility]

## 2022-10-03 NOTE — ED Notes (Signed)
Received no answer for report at [facility]

## 2022-10-03 NOTE — Discharge Instructions (Addendum)
As we discussed, your evaluation was reassuring tonight.  There have been no changes from your prior evaluations based on the notes from urology, your gynecological oncologist, etc.  Please follow-up with your primary care doctor and/or with the urology clinic to discuss your ongoing symptoms.  In the meantime you can continue taking your regular medications.

## 2022-10-06 ENCOUNTER — Non-Acute Institutional Stay: Payer: Medicare Other | Admitting: Nurse Practitioner

## 2022-10-06 ENCOUNTER — Encounter: Payer: Self-pay | Admitting: Nurse Practitioner

## 2022-10-06 DIAGNOSIS — G8929 Other chronic pain: Secondary | ICD-10-CM

## 2022-10-06 DIAGNOSIS — N838 Other noninflammatory disorders of ovary, fallopian tube and broad ligament: Secondary | ICD-10-CM

## 2022-10-06 DIAGNOSIS — Z515 Encounter for palliative care: Secondary | ICD-10-CM

## 2022-10-06 LAB — URINE CULTURE: Culture: >=100000 — AB

## 2022-10-06 NOTE — Progress Notes (Signed)
Therapist, nutritional Palliative Care Consult Note Telephone: [phone]  Fax: [phone]    Date of encounter: 10/06/22 11:41 AM PATIENT NAME: Laura Gonzalez [address] [place] [address]   [phone] (home)  DOB: [birth year] MRN: [id number] PRIMARY CARE PROVIDER:    [facility] Laura Laura Gonzalez  RESPONSIBLE PARTY:    Contact Information     Name Relation Home Work Mobile   Laura Gonzalez Friend [phone]  [phone]   Laura Gonzalez [phone]         I met face to face with patient in facility. Palliative Care was asked to follow this patient by consultation request of  Laura Laura Gonzalez to address advance care planning and complex medical decision making. This is the initial visit.        ASSESSMENT AND PLAN / RECOMMENDATIONS:  Symptom Management/Plan: 1. Advance Care Planning;  DNR; in agreement to hospice services.    2. Goals of Care: Goals include to maximize quality of life and symptom management. Our advance care planning conversation included a discussion about:    The value and importance of advance care planning  Exploration of personal, cultural or spiritual beliefs that might influence medical decisions  Exploration of goals of care in the event of a sudden injury or illness  Identification and preparation of a healthcare agent  Review and updating or creation of an advance directive document.   3. Palliative care encounter; Palliative care encounter; Palliative medicine team will continue to support patient, patient's family, and medical team. Visit consisted of counseling and education dealing with the complex and emotionally intense issues of symptom management and palliative care in the setting of serious and potentially life-threatening illness   4. Pain/neoplasm/morbid obesity; currently stable. likely due to mass; encourage to continue to communicate with staff when she is in  pain, continue with current regimen;    08/19/21- CT Abdomen Pelvis W Contrast 1. Large, complex cystic right adnexal mass, which corresponds to the lesion seen on the prior pelvic ultrasound. This is concerning for low-grade cystic neoplasm. Recommend GYN consult, and consider pelvis MRI w/o and w/ contrast if clinically warranted. Note: This recommendation does not apply to premenarchal patients and to those with increased risk (genetic, family history, elevated tumor markers or other high-risk factors) of ovarian cancer. Reference: JACR 2020 Feb; 17(2):248-254 2. Small pancreatic cyst which is new when compared to the prior abdomen and pelvis CT. Correlation with pancreatic protocol CT or MRI is recommended. 3. Cholelithiasis. 4. Colonic diverticulosis. 5. Nonobstructing right renal calculi. 6. Chronic fracture deformity of the superior endplate of the L3 vertebral body. 7. Aortic Atherosclerosis    09/11/2021 Pancreas CT  IMPRESSION: 1. 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 This is almost certainly a benign, incidental IPMN or pseudocyst, however as previously noted is new in the interval to 2016. Recommend initial follow-up pancreatic protocol CT or MRI in 1 year to establish stability. This recommendation follows ACR white paper guidelines on management of incidental pancreatic cystic lesions. 2. Cholelithiasis. 3. Nonobstructive right nephrolithiasis. 4. Colonic diverticulosis without evidence of acute diverticulitis.   5. Debility secondary to obesity, CHF, mass, discussed at length about her electric w/c she is requesting back, due to her requirements of having to be hoyer lifted the safety is a concern with the electric w/c tipping over. Encouraged Laura Gonzalez to continue to be oob, mobility, ongoing discussions of goc.  Fall risk   I spent 48 minutes providing this consultation. More than 50% of the time in this  consultation was spent in counseling and care coordination. 6 months ago PPS 50% (w/c dependent-independent) PPS 30%: currently   Chief Complaint: Initial palliative consult for complex medical decision making, address goals, manage ongoing symptoms   HISTORY OF PRESENT ILLNESS:  Laura Gonzalez is a 82 y.o. year old female  with multiple medical problems including ovarian mass; chronic diastolic CHF, afib, varicose veins BLE, HTN, PAD, asthma, sleep apnea, chronic osteoarthritis, GERD, h/o diverticulitis, HLD, h/o skin cancer, cataract. Laura Gonzalez resides at [facility] [place], she is a Product/process development scientist to w/c. Laura Gonzalez is total care, adl's bathing, dressing. Laura Gonzalez does feed herself with declined appetite.   Laura Gonzalez with mass increasing in size to 11 cm, but it is not amendable to biopsy. Laura Gonzalez had Laura visit 07/01/2022  with Laura Gonzalez for second opinion; Laura Gonzalez 21.9; Laura Gonzalez 92.6; with per documentation poor surgical candidate, not recommended sgy; no further interventions or options available due to co-morbidities, morbid obesity, overall decline.   Appointment with Laura Gonzalez with 09/30/2022 with abdominal pain; with recent CT revealing mass increase to 13 cm, unable to put a drain in due to not being total care, unable to get on the table due to morbid obesity.   07/15/2021 Pelvic US FINDINGS: Uterus:  Measurements: 5.3 x 1.9 x 2.4 cm = volume: 12.7 mL. Slightly heterogeneous cervix with multiple cysts and small amount of fluid.   Endometrium  Thickness: 2.4 mm.  No focal abnormality visualized.   Right ovary: Right ovarian tissue not discretely visualized. Complex cystic mass in the right adnexa with multiple septations and possible peripheral nodularity, this measures 6.7 x 5.8 x 7.3 cm.   Left ovary: Not seen   Other findings: No abnormal free fluid.   IMPRESSION: 1. 7.3 cm complex cystic mass in the right adnexa containing multiple septations and possible peripheral  nodularity, findings raise concern for adnexal neoplasm. Surgical consultation is recommended. 2. Slightly heterogeneous appearance of cervix with multiple cysts and small amount of fluid, this may be correlated with direct inspection and potential Pap smear. Laura Gonzalez            21.9 Laura Gonzalez                 92.6 Post menopausal ROMA 2.46- low  CT scan 08/25/22 Impression No bowel obstruction, free air or free fluid. Diffuse colonic diverticulosis.   Previous right ovarian cystic lesion is now larger at up to 13.3 x 9.6 cm. Previous measurement of 8.2 x 8.1 cm in June 2023. Again worrisome for malignancy as previously stated. Please correlate with prior biopsy results.   Dilated gallbladder filled with stones and polyps sludge. If there is concern specifically of gallbladder pathology recommend further workup such as ultrasound or HIDA scan to assess for acute cholecystitis.   Marked increase in the number of stones filling the right renal collecting system, staghorn like, with increasing collecting system dilatation of the kidney itself. There are also bladder stones. No ureteral stones.   New somewhat aggressive appearing 17 mm cystic lesion along the tail of the pancreas. 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 Recommend further workup with dynamic MRI when clinically appropriate.   Degenerative changes of the spine and pelvis. Stable compression of L3.   ED visit 10/02/2022 to 10/03/2022 for gross hematuria, chronic staghorn calculus; irrigated; stabilized and returned to VOB.  I visited and observed Laura Chohan;Gergely talked about recent events with Laura visit; ED visit. Laura Morel and I talked about ros, functional decline; medical barriers and concerns about worsening condition, increase in growth of mass with now hematuria with ongoing pain. We talked about goc, code status and Laura Fidalgo is in agreement to DNR, golden rod form completed. Also discussed option of hospice services with  medicare benefit. Laura Reis in agreement to have hospice evaluation. Support provided.   Medications, goc, poc reviewed. Updated Laura Laura Gonzalez   History obtained from review of EMR, discussion with primary team, and interview with family, facility staff/caregiver and/or Laura. Tyree.  I reviewed available labs, medications, imaging, studies and related documents from the EMR.  Records reviewed and summarized above.    Physical Exam: General:  morbid obesity, debilitated pleasant female ENMT: oral mucous membranes moist CV: S1S2, RRR Pulmonary: clear, anterior Abdomen: multiple masses palpated; obese, normo-active BS + 4 quadrants, soft and non tender MSK: edematous Skin: warm and dry Neuro:  functional quadriplegic Psych: non-anxious affect, A and O x 3 Thank you for the opportunity to participate in the care of Laura. Favorite. Please call our office at [phone] if we can be of additional assistance.   Harrietta Incorvaia Prince [place], NP

## 2022-10-07 ENCOUNTER — Telehealth: Payer: Self-pay | Admitting: Pharmacist

## 2022-10-07 ENCOUNTER — Telehealth: Payer: Self-pay

## 2022-10-07 NOTE — Telephone Encounter (Signed)
Notified that Laura Gonzalez is now on hospice services. No oncology follow up was noted.

## 2022-10-07 NOTE — Telephone Encounter (Signed)
Talked to patient about urine culture results and plan. She informed us everything should go though Dr Dareen Piano at [facility] [place].   4/17@1250 : Left message for Arline Asp RN - Director of Nursing ED clinical Pharmacist  number [phone] provided. Nursing director to call back for treatment recommendations from Dr Elvera Lennox. Cyril Loosen.   Instructions: Start Keflex  BID x 5 days (allergy to amoxicillin noted, patient tolerated cephalosporins in the past).   Specimen Description URINE, CATHETERIZED Performed at [facility], [address], [place], [place] [address]  Special Requests NONE Reflexed from [phone] Performed at [facility], [address] [place], [place] [address]  Culture >=100,000 COLONIES/mL ESCHERICHIA COLI Abnormal   Report Status 10/06/2022 FINAL  Organism ID, Bacteria ESCHERICHIA COLI Abnormal   Resulting Agency [facility]     Susceptibility   Escherichia coli    MIC    AMPICILLIN >=32 RESIST... Resistant    AMPICILLIN/SULBACTAM 4 SENSITIVE Sensitive    CEFAZOLIN <=4 SENSITIVE Sensitive    CEFTRIAXONE <=0.25 SENS... Sensitive    CIPROFLOXACIN >=4 RESISTANT Resistant    GENTAMICIN <=1 SENSITIVE Sensitive    IMIPENEM <=0.25 SENS... Sensitive    NITROFURANTOIN 128 RESISTANT Resistant    TRIMETH/SULFA <=20 SENSIT... Sensitive           Emmely Bittinger Rodriguez-Guzman PharmD, BCPS 10/07/2022 12:58 PM

## 2022-10-07 NOTE — Telephone Encounter (Signed)
Recommendation provided. RN received culture report and will discuss with in-house provider 1st thing in the morning.

## 2022-11-04 ENCOUNTER — Other Ambulatory Visit: Payer: Medicare Other | Admitting: Urology

## 2022-12-21 DEATH — deceased
